# Patient Record
Sex: Male | Born: 1950 | Race: White | Hispanic: No | Smoking: Current every day smoker
Health system: Southern US, Community
[De-identification: ages and names within clinical notes are randomized; demographics above are authoritative.]

## PROBLEM LIST (undated history)

## (undated) DIAGNOSIS — I480 Paroxysmal atrial fibrillation: Secondary | ICD-10-CM

## (undated) DIAGNOSIS — I739 Peripheral vascular disease, unspecified: Secondary | ICD-10-CM

## (undated) DIAGNOSIS — Z930 Tracheostomy status: Secondary | ICD-10-CM

## (undated) DIAGNOSIS — N200 Calculus of kidney: Secondary | ICD-10-CM

## (undated) DIAGNOSIS — I4891 Unspecified atrial fibrillation: Secondary | ICD-10-CM

## (undated) DIAGNOSIS — I639 Cerebral infarction, unspecified: Secondary | ICD-10-CM

## (undated) DIAGNOSIS — G40909 Epilepsy, unspecified, not intractable, without status epilepticus: Secondary | ICD-10-CM

## (undated) DIAGNOSIS — J9621 Acute and chronic respiratory failure with hypoxia: Secondary | ICD-10-CM

## (undated) DIAGNOSIS — K219 Gastro-esophageal reflux disease without esophagitis: Secondary | ICD-10-CM

## (undated) DIAGNOSIS — I10 Essential (primary) hypertension: Secondary | ICD-10-CM

## (undated) DIAGNOSIS — J449 Chronic obstructive pulmonary disease, unspecified: Secondary | ICD-10-CM

## (undated) DIAGNOSIS — M549 Dorsalgia, unspecified: Secondary | ICD-10-CM

## (undated) DIAGNOSIS — G8929 Other chronic pain: Secondary | ICD-10-CM

## (undated) DIAGNOSIS — F419 Anxiety disorder, unspecified: Secondary | ICD-10-CM

## (undated) DIAGNOSIS — I499 Cardiac arrhythmia, unspecified: Secondary | ICD-10-CM

## (undated) DIAGNOSIS — Z87442 Personal history of urinary calculi: Secondary | ICD-10-CM

## (undated) DIAGNOSIS — M1990 Unspecified osteoarthritis, unspecified site: Secondary | ICD-10-CM

## (undated) DIAGNOSIS — Z931 Gastrostomy status: Secondary | ICD-10-CM

## (undated) HISTORY — PX: TRACHEOSTOMY TUBE PLACEMENT: SHX814

## (undated) HISTORY — DX: Cerebral infarction, unspecified: I63.9

## (undated) HISTORY — PX: AORTOGRAM: SHX6300

## (undated) HISTORY — DX: Unspecified atrial fibrillation: I48.91

## (undated) HISTORY — PX: KIDNEY STONE SURGERY: SHX686

## (undated) HISTORY — PX: CATARACT EXTRACTION, BILATERAL: SHX1313

## (undated) HISTORY — PX: CHOLECYSTECTOMY: SHX55

## (undated) HISTORY — PX: EYE SURGERY: SHX253

## (undated) HISTORY — PX: CATARACT EXTRACTION: SUR2

---

## 2000-01-04 ENCOUNTER — Emergency Department (HOSPITAL_COMMUNITY): Admission: EM | Admit: 2000-01-04 | Discharge: 2000-01-04 | Payer: Self-pay | Admitting: *Deleted

## 2006-04-28 ENCOUNTER — Ambulatory Visit: Payer: Self-pay | Admitting: Cardiology

## 2006-09-22 ENCOUNTER — Encounter: Admission: RE | Admit: 2006-09-22 | Discharge: 2006-10-22 | Payer: Self-pay | Admitting: Specialist

## 2008-12-02 ENCOUNTER — Ambulatory Visit: Payer: Self-pay | Admitting: Cardiology

## 2012-05-29 ENCOUNTER — Encounter (HOSPITAL_COMMUNITY): Payer: Self-pay | Admitting: *Deleted

## 2012-05-29 ENCOUNTER — Emergency Department (HOSPITAL_COMMUNITY)
Admission: EM | Admit: 2012-05-29 | Discharge: 2012-05-29 | Disposition: A | Discharge status: Home / Self Care | Payer: Medicare Other | Attending: Emergency Medicine | Admitting: Emergency Medicine

## 2012-05-29 DIAGNOSIS — G8929 Other chronic pain: Secondary | ICD-10-CM | POA: Insufficient documentation

## 2012-05-29 DIAGNOSIS — I10 Essential (primary) hypertension: Secondary | ICD-10-CM | POA: Insufficient documentation

## 2012-05-29 DIAGNOSIS — Z008 Encounter for other general examination: Secondary | ICD-10-CM | POA: Insufficient documentation

## 2012-05-29 DIAGNOSIS — F17200 Nicotine dependence, unspecified, uncomplicated: Secondary | ICD-10-CM | POA: Insufficient documentation

## 2012-05-29 DIAGNOSIS — R454 Irritability and anger: Secondary | ICD-10-CM

## 2012-05-29 HISTORY — DX: Other chronic pain: G89.29

## 2012-05-29 HISTORY — DX: Dorsalgia, unspecified: M54.9

## 2012-05-29 HISTORY — DX: Essential (primary) hypertension: I10

## 2012-05-29 LAB — COMPREHENSIVE METABOLIC PANEL
ALT: 9 U/L (ref 0–53)
AST: 13 U/L (ref 0–37)
Alkaline Phosphatase: 121 U/L — ABNORMAL HIGH (ref 39–117)
CO2: 31 mEq/L (ref 19–32)
Calcium: 9.8 mg/dL (ref 8.4–10.5)
GFR calc non Af Amer: 71 mL/min — ABNORMAL LOW (ref >90)
Potassium: 3.2 mEq/L — ABNORMAL LOW (ref 3.5–5.1)
Sodium: 141 mEq/L (ref 135–145)
Total Protein: 7.1 g/dL (ref 6.0–8.3)

## 2012-05-29 LAB — CBC
Hemoglobin: 16.1 g/dL (ref 13.0–17.0)
MCH: 31.6 pg (ref 26.0–34.0)
Platelets: 183 10*3/uL (ref 150–400)
RBC: 5.1 MIL/uL (ref 4.22–5.81)

## 2012-05-29 LAB — RAPID URINE DRUG SCREEN, HOSP PERFORMED
Barbiturates: NOT DETECTED
Benzodiazepines: NOT DETECTED
Cocaine: NOT DETECTED
Tetrahydrocannabinol: NOT DETECTED

## 2012-05-29 MED ORDER — POTASSIUM CHLORIDE CRYS ER 20 MEQ PO TBCR
40.0000 meq | EXTENDED_RELEASE_TABLET | Freq: Once | ORAL | Status: AC
  Administered 2012-05-29: 40 meq via ORAL
  Filled 2012-05-29: qty 2
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
## 2012-05-29 NOTE — ED Notes (Signed)
Patient stated that he had not taken his b/p medication today and upset over situation.
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
## 2013-03-04 ENCOUNTER — Other Ambulatory Visit: Payer: Self-pay | Admitting: *Deleted

## 2013-03-11 ENCOUNTER — Encounter: Payer: Self-pay | Admitting: Vascular Surgery

## 2013-03-12 ENCOUNTER — Encounter: Payer: Self-pay | Admitting: Vascular Surgery

## 2013-03-12 ENCOUNTER — Ambulatory Visit (INDEPENDENT_AMBULATORY_CARE_PROVIDER_SITE_OTHER): Payer: Medicare Other | Admitting: Vascular Surgery

## 2013-03-12 VITALS — BP 146/83 | HR 69 | Ht 60.0 in | Wt 135.0 lb

## 2013-03-12 DIAGNOSIS — I70219 Atherosclerosis of native arteries of extremities with intermittent claudication, unspecified extremity: Secondary | ICD-10-CM

## 2013-03-12 DIAGNOSIS — M545 Low back pain, unspecified: Secondary | ICD-10-CM

## 2013-03-12 DIAGNOSIS — I739 Peripheral vascular disease, unspecified: Secondary | ICD-10-CM

## 2013-03-12 NOTE — Progress Notes (Signed)
[facility] [place]  Referred by:  Caylen Daniel, MD [address] [place], [place] [address]  Reason for referral: right leg weakness  History of Present Illness  Adam Mcclain is a 62 y.o. (09/18/1950) male who presents with chief complaint: right leg weakness.  Onset of intermittent claudication at least 3 years ago.  Now patient have weakness in right leg with mowing lawn.  Pain is described as aching, crampy, severity 3-10/10, and associated with activity.  Patient has fallen severe times due to weakness.  Patient has attempted to treat this pain with rest and pain medication.  The patient has no rest pain symptoms also and no leg wounds/ulcers. The patient has chronic back pain managed with narcotics.  He also previously had a stroke without any report residual neurologic deficits.  Pt also notes some erectile dysfunction.  Atherosclerotic risk factors include: HTN, prior active smoking (recently quit).  Past Medical History  Diagnosis Date  . Hypertension   . Chronic back pain   . Stroke   . Atrial fibrillation     Past Surgical History  Procedure Laterality Date  . Cholecystectomy    . Eye surgery    . Kidney stone surgery    . Cataract extraction Bilateral     History   Social History  . Marital Status: Married    Spouse Name: N/A    Number of Children: N/A  . Years of Education: N/A   Occupational History  . Not on file.   Social History Main Topics  . Smoking status: Former Smoker    Types: Cigarettes    Quit date: 02/27/2013  . Smokeless tobacco: Never Used  . Alcohol Use: No  . Drug Use: No  . Sexually Active: Not on file   Other Topics Concern  . Not on file   Social History Narrative  . No narrative on file    Family History  Problem Relation Age of Onset  . Cancer Father   . Diabetes Father   . Hypertension Father     Current Outpatient Prescriptions on File Prior to Visit  Medication Sig Dispense Refill  . ALPRAZolam  (XANAX) 0.5 MG tablet Take 0.5 mg by mouth daily.      . lisinopril-hydrochlorothiazide (PRINZIDE,ZESTORETIC) 20-12.5 MG per tablet Take 1 tablet by mouth daily.      . buPROPion (WELLBUTRIN SR) 150 MG 12 hr tablet Take 150 mg by mouth 2 (two) times daily.       No current facility-administered medications on file prior to visit.   No Known Allergies  REVIEW OF SYSTEMS:  (Positives checked otherwise negative)  CARDIOVASCULAR:  [x] chest pain, [ ] chest pressure, [ ] palpitations, [ ] shortness of breath when laying flat, [x] shortness of breath with exertion,   [x] pain in feet when walking, [x] pain in feet when laying flat, [x] history of blood clot in veins (DVT), [ ] history of phlebitis, [x] swelling in legs, [x] varicose veins  PULMONARY:  [x] productive cough, [ ] asthma, [x] wheezing  NEUROLOGIC:  [x] weakness in arms or legs, [x] numbness in arms or legs, [x] difficulty speaking or slurred speech, [ ] temporary loss of vision in one eye, [x] dizziness  HEMATOLOGIC:  [ ] bleeding problems, [ ] problems with blood clotting too easily  MUSCULOSKEL:  [ ] joint pain, [ ] joint swelling  GASTROINTEST:  [ ]  Vomiting blood, [ ]  Blood in stool       GENITOURINARY:  [x]  Burning with urination, [ ]  Blood in urine  PSYCHIATRIC:  [x] history of major depression  INTEGUMENTARY:  [x] rashes, [x] ulcers  CONSTITUTIONAL:  [x] fever, [x] chills  For VQI Use Only  PRE-ADM LIVING: Home  AMB STATUS: Ambulatory  CAD Sx: None  PRIOR CHF: None  STRESS TEST: [x] No, [ ] Normal, [ ] + ischemia, [ ] + MI, [ ] Both  Physical Examination Filed Vitals:   03/12/13 1504  BP: 146/83  Pulse: 69  Height: 5' (1.524 m)  Weight: 135 lb (61.236 kg)  SpO2: 100%   Body mass index is 26.37 kg/(m^2).  General: A&O x 3, WDWN, somewhat unkempt, thick accent  Head: [place]/AT  Ear/Nose/Throat: Hearing grossly intact, nares w/o erythema or drainage, oropharynx w/o Erythema/Exudate  Eyes: PERRLA,  EOMI  Neck: Supple, no nuchal rigidity, no palpable LAD  Pulmonary: Sym exp, good air movt, CTAB, no rales & rhonchi, faint wheezing  Cardiac: RRR, Nl S1, S2, no Murmurs, rubs or gallops  Vascular: Vessel Right Left  Radial Palpable Palpable  Brachial Palpable Palpable  Carotid Palpable, without bruit Palpable, without bruit  Aorta Not palpable N/A  Femoral Not Palpable  Palpable  Popliteal  Not palpable  Not palpable  PT Not Palpable Not Palpable  DP Not Palpable Not Palpable   Gastrointestinal: soft, NTND, -G/R, - HSM, - masses, - CVAT B, no palpable midline pulsatile mass  Musculoskeletal: M/S 5/5 throughout , Extremities without ischemic changes , some hyperkyphosis   Neurologic: CN 2-12 intact , Pain and light touch intact in extremities , Motor exam as listed above  Psychiatric: Judgment intact, Mood & affect appropriatefor pt's clinical situation  Dermatologic: See M/S exam for extremity exam, no rashes otherwise noted  Lymph : No Cervical, Axillary, or Inguinal lymphadenopathy   Non-Invasive Vascular Imaging  ABI (Date: 03/12/2013)  R: 0.61  L: 0.56  Outside Studies/Documentation 10 pages of outside documents were reviewed including: outside ABI, outpatient clinic chart.  Medical Decision Making  Adam Mcclain is a 62 y.o. male who presents with: R>>L lifestyle limiting intermittent claudication, lower back pain with possible DDD   I have a suspicion that this patient's ABI are artificially elevated and he might in fact have critical limb ischemia.  The right .femoral pulse is difficulty to palpate, suggesting Right iliac occlusion.  I this patient, I think the potential benefit outweigh the risk, so I would proceed with an aortogram, bilateral leg runoff, and possible right intervention.  This is scheduled for 10 JUL 14  I discussed with the patient the natural history of intermittent claudication: 75% of patients have stable or improved symptoms in a year  an only 2% require amputation. Eventually 20% may require intervention in a year.  I discussed in depth with the patient the nature of atherosclerosis, and emphasized the importance of maximal medical management including strict control of blood pressure, blood glucose, and lipid levels, antiplatelet agent, obtaining regular exercise, and cessation of smoking.    The patient is aware that without maximal medical management the underlying atherosclerotic disease process will progress, limiting the benefit of any interventions.  I discussed in depth with the patient a walking plan and how to execute such. The patient is currently on a statin: Lovastatin.    The patient is currently on an anti-platelet: ASA.    Thank you for allowing us to participate in this patient's care.  Brian Chen, MD [facility] [place]   Office: [phone] Pager: [phone]  03/12/2013, 4:47 PM    

## 2013-03-18 ENCOUNTER — Other Ambulatory Visit: Payer: Self-pay

## 2013-03-22 ENCOUNTER — Encounter (HOSPITAL_COMMUNITY): Payer: Self-pay | Admitting: Pharmacy Technician

## 2013-03-25 ENCOUNTER — Encounter (HOSPITAL_COMMUNITY)
Admission: RE | Disposition: A | Discharge status: Home / Self Care | Payer: Self-pay | Source: Ambulatory Visit | Attending: Vascular Surgery

## 2013-03-25 ENCOUNTER — Ambulatory Visit (HOSPITAL_COMMUNITY)
Admission: RE | Admit: 2013-03-25 | Discharge: 2013-03-25 | Disposition: A | Discharge status: Home / Self Care | Payer: Medicare Other | Source: Ambulatory Visit | Attending: Vascular Surgery | Admitting: Vascular Surgery

## 2013-03-25 DIAGNOSIS — Z79899 Other long term (current) drug therapy: Secondary | ICD-10-CM | POA: Insufficient documentation

## 2013-03-25 DIAGNOSIS — M549 Dorsalgia, unspecified: Secondary | ICD-10-CM | POA: Insufficient documentation

## 2013-03-25 DIAGNOSIS — I70219 Atherosclerosis of native arteries of extremities with intermittent claudication, unspecified extremity: Secondary | ICD-10-CM

## 2013-03-25 DIAGNOSIS — I10 Essential (primary) hypertension: Secondary | ICD-10-CM | POA: Insufficient documentation

## 2013-03-25 DIAGNOSIS — Z8673 Personal history of transient ischemic attack (TIA), and cerebral infarction without residual deficits: Secondary | ICD-10-CM | POA: Insufficient documentation

## 2013-03-25 DIAGNOSIS — Z87891 Personal history of nicotine dependence: Secondary | ICD-10-CM | POA: Insufficient documentation

## 2013-03-25 DIAGNOSIS — Z9181 History of falling: Secondary | ICD-10-CM | POA: Insufficient documentation

## 2013-03-25 DIAGNOSIS — I708 Atherosclerosis of other arteries: Secondary | ICD-10-CM | POA: Insufficient documentation

## 2013-03-25 DIAGNOSIS — G8929 Other chronic pain: Secondary | ICD-10-CM | POA: Insufficient documentation

## 2013-03-25 DIAGNOSIS — I4891 Unspecified atrial fibrillation: Secondary | ICD-10-CM | POA: Insufficient documentation

## 2013-03-25 DIAGNOSIS — N529 Male erectile dysfunction, unspecified: Secondary | ICD-10-CM | POA: Insufficient documentation

## 2013-03-25 HISTORY — PX: ABDOMINAL AORTAGRAM: SHX5454

## 2013-03-25 HISTORY — PX: PERCUTANEOUS STENT INTERVENTION: SHX5500

## 2013-03-25 HISTORY — PX: ABDOMINAL AORTAGRAM: SHX5706

## 2013-03-25 HISTORY — PX: LOWER EXTREMITY ANGIOGRAM: SHX5508

## 2013-03-25 LAB — POCT I-STAT, CHEM 8
Creatinine, Ser: 1.4 mg/dL — ABNORMAL HIGH (ref 0.50–1.35)
Hemoglobin: 16.7 g/dL (ref 13.0–17.0)
Sodium: 140 mEq/L (ref 135–145)
TCO2: 28 mmol/L (ref 0–100)

## 2013-03-25 LAB — POCT ACTIVATED CLOTTING TIME: Activated Clotting Time: 196 seconds

## 2013-03-25 SURGERY — ANGIOGRAM, LOWER EXTREMITY
Anesthesia: LOCAL | Laterality: Right

## 2013-03-25 MED ORDER — HYDRALAZINE HCL 20 MG/ML IJ SOLN: 10.0000 mg | INTRAMUSCULAR | Status: DC | PRN

## 2013-03-25 MED ORDER — HYDRALAZINE HCL 20 MG/ML IJ SOLN
INTRAMUSCULAR | Status: AC
  Filled 2013-03-25: qty 1

## 2013-03-25 MED ORDER — MORPHINE SULFATE 2 MG/ML IJ SOLN: 2.0000 mg | INTRAMUSCULAR | Status: DC | PRN

## 2013-03-25 MED ORDER — SODIUM CHLORIDE 0.9 % IV SOLN
INTRAVENOUS | Status: DC
  Administered 2013-03-25: 08:00:00 via INTRAVENOUS

## 2013-03-25 MED ORDER — OXYCODONE-ACETAMINOPHEN 5-325 MG PO TABS: 1.0000 | ORAL_TABLET | ORAL | Status: DC | PRN

## 2013-03-25 MED ORDER — CLOPIDOGREL BISULFATE 300 MG PO TABS
ORAL_TABLET | ORAL | Status: AC
  Filled 2013-03-25: qty 1

## 2013-03-25 MED ORDER — HEPARIN SODIUM (PORCINE) 1000 UNIT/ML IJ SOLN
INTRAMUSCULAR | Status: AC
  Filled 2013-03-25: qty 1

## 2013-03-25 MED ORDER — HYDRALAZINE HCL 20 MG/ML IJ SOLN
10.0000 mg | Freq: Once | INTRAMUSCULAR | Status: AC
  Administered 2013-03-25: 10 mg via INTRAVENOUS
  Filled 2013-03-25: qty 1

## 2013-03-25 MED ORDER — SODIUM CHLORIDE 0.9 % IV SOLN: INTRAVENOUS | Status: DC

## 2013-03-25 MED ORDER — MIDAZOLAM HCL 2 MG/2ML IJ SOLN
INTRAMUSCULAR | Status: AC
  Filled 2013-03-25: qty 2

## 2013-03-25 MED ORDER — FENTANYL CITRATE 0.05 MG/ML IJ SOLN
INTRAMUSCULAR | Status: AC
  Filled 2013-03-25: qty 2

## 2013-03-25 MED ORDER — ATROPINE SULFATE 0.1 MG/ML IJ SOLN
1.0000 mg | Freq: Once | INTRAMUSCULAR | Status: AC
  Administered 2013-03-25: 1 mg via INTRAVENOUS

## 2013-03-25 MED ORDER — HYDRALAZINE HCL 20 MG/ML IJ SOLN
INTRAMUSCULAR | Status: AC
  Administered 2013-03-25: 10 mg via INTRAVENOUS
  Filled 2013-03-25: qty 1

## 2013-03-25 MED ORDER — LIDOCAINE HCL (PF) 1 % IJ SOLN
INTRAMUSCULAR | Status: AC
  Filled 2013-03-25: qty 30

## 2013-03-25 MED ORDER — ACETAMINOPHEN 325 MG PO TABS: 650.0000 mg | ORAL_TABLET | ORAL | Status: DC | PRN

## 2013-03-25 MED ORDER — HEPARIN (PORCINE) IN NACL 2-0.9 UNIT/ML-% IJ SOLN
INTRAMUSCULAR | Status: AC
  Filled 2013-03-25: qty 1000

## 2013-03-25 MED ORDER — CLOPIDOGREL BISULFATE 75 MG PO TABS: 75.0000 mg | ORAL_TABLET | Freq: Every day | ORAL | Status: DC

## 2013-03-25 MED ORDER — ONDANSETRON HCL 4 MG/2ML IJ SOLN: 4.0000 mg | Freq: Four times a day (QID) | INTRAMUSCULAR | Status: DC | PRN

## 2013-03-25 MED ORDER — ATROPINE SULFATE 1 MG/ML IJ SOLN
INTRAMUSCULAR | Status: AC
  Filled 2013-03-25: qty 1

## 2013-03-25 NOTE — Op Note (Signed)
OPERATIVE NOTE   PROCEDURE: 1.  Left common femoral artery cannulation under ultrasound guidance 2.  Aortogram 3.  Third order arterial selection 4.  Right common iliac artery stenting and angioplasty (7 mm x 22 mm iCAST) 5.  Bilateral leg runoff  PRE-OPERATIVE DIAGNOSIS: Right > left leg claudication  POST-OPERATIVE DIAGNOSIS: same as above   SURGEON: Leonides Sake, MD  ANESTHESIA: conscious sedation  ESTIMATED BLOOD LOSS: 50 cc  CONTRAST: 150 cc  FINDING(S):  Aorta: patent but diffusely diseased  Superior mesenteric artery: not well imaged Celiac artery: not imaged   Right Left  RA Patent 2 polar arteries with diseased inferior artery Patent 2 polar arteries with >90% stenosis in inferior artery  CIA >90% stenosis in proximal CIA: resolved with stenting and angioplasty Patent but diseased  EIA Patent but diseased Patent but diseased  IIA Patent >90% occlusion at orifice  CFA Patent Patent  SFA Patent but diseased Occluded  PFA Patent Patent with extensive hypertrophy and collaterals  Pop Patent but diseased Patent but diseased  Trif Patent Patent  AT Patent proximally, occludes shortly after take off Patent proximally, occludes shortly after take off  Pero Patent, non-dominant runoff to foot Patent proximally, diminishes distally  PT Patent, dominant runoff to foot Patent, dominant runoff to foot   SPECIMEN(S):  none  INDICATIONS:   Adam Mcclain is a 62 y.o. male who presents with right >> left intermittent claudication.  The patient presents for: aortogram, bilateral leg runoff, and possible intervention.  I discussed with the patient the nature of angiographic procedures, especially the limited patencies of any endovascular intervention.  The patient is aware of that the risks of an angiographic procedure include but are not limited to: bleeding, infection, access site complications, renal failure, embolization, rupture of vessel, dissection, possible need for emergent  surgical intervention, possible need for surgical procedures to treat the patient's pathology, and stroke and death.  The patient is aware of the risks and agrees to proceed.  DESCRIPTION: After full informed consent was obtained from the patient, the patient was brought back to the angiography suite.  The patient was placed supine upon the angiography table and connected to monitoring equipment.  The patient was then given conscious sedation, the amounts of which are documented in the patient's chart.  The patient was prepped and drape in the standard fashion for an angiographic procedure.  At this point, attention was turned to the left groin.  Under ultrasound guidance, the left common femoral artery will be cannulated with a 18 gauge needle.  The [facility] wire was passed up into the aorta.  The needle was exchanged for a 5-Fr sheath, which was advanced over the wire into the common femoral artery.  The dilator was then removed.The Omniflush catheter was then loaded over the wire up to the level of L1.  The catheter was connected to the power injector circuit.  After de-airring and de-clotting the circuit, a power injector aortogram was completed.  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  The [facility] wire was replaced in the catheter, and using the [place] and Omniflush catheter, the right common iliac artery was selected.  The wire was advanced into the common femoral artery.  The catheter was exchanged for an end-hole catheter and advanced over the wire into the right common femoral artery.  The wire was exchanged for a Rosen wire.  The patient's left femoral sheath was exchanged for a  7-Fr destination sheath, which was lodged in the right external iliac artery.  The dilator was removed.  The patient was given 5000 units of Heparin intravenously, which was a therapeutic bolus.  Based on vertebral landmarks, the lesion was at ~L4 vertebral body.  A 7  mm x 22 mm iCAST stent was selected and advanced through the stent to where the lesion was suspected.  I pulled back the sheath to expose the stent.  I did hand injections to identify the location of the stenosis.  I pulled back the sheath and stent to center the stent on the stenosis.  The stent was deployed at 10 atm of inflation.  I removed the stent delivery device and did a completion injection.  Complete resolution of the stenosis had occurred with exact placement of the stent.  At this point, I placed the endhole catheter over the wire to recapture the tip of the Rosen wire.  The wire was readvanced into the distal aorta.  The Omniflush was replaced over the wire into the distal aorta.  An automated bilateral leg runoff was completed.  The findings are listed above.  Due to patient movement repeat stationary image of the tibial arteries was completed.  I pulled the sheath back into left external iliac artery.  The sheath was aspirated.  No clots were present and the sheath was reloaded with heparinized saline.    COMPLICATIONS: none  CONDITION: stable  Leonides Sake, MD [facility] [place] Office: [phone] Pager: [phone]  03/25/2013, 11:36 AM

## 2013-03-25 NOTE — Interval H&P Note (Signed)
[facility]   History and Physical Update  The patient was interviewed and re-examined.  The patient's previous History and Physical has been reviewed and is unchanged.  There is no change in the plan of care: Ao, BRo, possible R leg runoff.  Leonides Sake, MD [facility] [place] Office: [phone] Pager: [phone]  03/25/2013, 9:24 AM

## 2013-03-25 NOTE — H&P (View-Only) (Signed)
[facility] [place]  Referred by:  Donzetta Sprung, MD [address] [place]. [place], [place] [address]  Reason for referral: right leg weakness  History of Present Illness  Adam Mcclain is a 62 y.o. ([birth year]) male who presents with chief complaint: right leg weakness.  Onset of intermittent claudication at least 3 years ago.  Now patient have weakness in right leg with mowing lawn.  Pain is described as aching, crampy, severity 3-10/10, and associated with activity.  Patient has fallen severe times due to weakness.  Patient has attempted to treat this pain with rest and pain medication.  The patient has no rest pain symptoms also and no leg wounds/ulcers. The patient has chronic back pain managed with narcotics.  He also previously had a stroke without any report residual neurologic deficits.  Pt also notes some erectile dysfunction.  Atherosclerotic risk factors include: HTN, prior active smoking (recently quit).  Past Medical History  Diagnosis Date  . Hypertension   . Chronic back pain   . Stroke   . Atrial fibrillation     Past Surgical History  Procedure Laterality Date  . Cholecystectomy    . Eye surgery    . Kidney stone surgery    . Cataract extraction Bilateral     History   Social History  . Marital Status: Married    Spouse Name: N/A    Number of Children: N/A  . Years of Education: N/A   Occupational History  . Not on file.   Social History Main Topics  . Smoking status: Former Smoker    Types: Cigarettes    Quit date: 02/27/2013  . Smokeless tobacco: Never Used  . Alcohol Use: No  . Drug Use: No  . Sexually Active: Not on file   Other Topics Concern  . Not on file   Social History Narrative  . No narrative on file    Family History  Problem Relation Age of Onset  . Cancer Father   . Diabetes Father   . Hypertension Father     Current Outpatient Prescriptions on File Prior to Visit  Medication Sig Dispense Refill  . ALPRAZolam  (XANAX) 0.5 MG tablet Take 0.5 mg by mouth daily.      Marland Kitchen lisinopril-hydrochlorothiazide (PRINZIDE,ZESTORETIC) 20-12.5 MG per tablet Take 1 tablet by mouth daily.      Marland Kitchen buPROPion (WELLBUTRIN SR) 150 MG 12 hr tablet Take 150 mg by mouth 2 (two) times daily.       No current facility-administered medications on file prior to visit.   No Known Allergies  REVIEW OF SYSTEMS:  (Positives checked otherwise negative)  CARDIOVASCULAR:  [x]  chest pain, [ ]  chest pressure, [ ]  palpitations, [ ]  shortness of breath when laying flat, [x]  shortness of breath with exertion,   [x]  pain in feet when walking, [x]  pain in feet when laying flat, [x]  history of blood clot in veins (DVT), [ ]  history of phlebitis, [x]  swelling in legs, [x]  varicose veins  PULMONARY:  [x]  productive cough, [ ]  asthma, [x]  wheezing  NEUROLOGIC:  [x]  weakness in arms or legs, [x]  numbness in arms or legs, [x]  difficulty speaking or slurred speech, [ ]  temporary loss of vision in one eye, [x]  dizziness  HEMATOLOGIC:  [ ]  bleeding problems, [ ]  problems with blood clotting too easily  MUSCULOSKEL:  [ ]  joint pain, [ ]  joint swelling  GASTROINTEST:  [ ]   Vomiting blood, [ ]   Blood in stool  GENITOURINARY:  [x]   Burning with urination, [ ]   Blood in urine  PSYCHIATRIC:  [x]  history of major depression  INTEGUMENTARY:  [x]  rashes, [x]  ulcers  CONSTITUTIONAL:  [x]  fever, [x]  chills  For VQI Use Only  PRE-ADM LIVING: Home  AMB STATUS: Ambulatory  CAD Sx: None  PRIOR CHF: None  STRESS TEST: [x]  No, [ ]  Normal, [ ]  + ischemia, [ ]  + MI, [ ]  Both  Physical Examination Filed Vitals:   03/12/13 1504  BP: 146/83  Pulse: 69  Height: 5' (1.524 m)  Weight: 135 lb (61.236 kg)  SpO2: 100%   Body mass index is 26.37 kg/(m^2).  General: A&O x 3, WDWN, somewhat unkempt, thick accent  Head: [place]/AT  Ear/Nose/Throat: Hearing grossly intact, nares w/o erythema or drainage, oropharynx w/o Erythema/Exudate  Eyes: PERRLA,  EOMI  Neck: Supple, no nuchal rigidity, no palpable LAD  Pulmonary: Sym exp, good air movt, CTAB, no rales & rhonchi, faint wheezing  Cardiac: RRR, Nl S1, S2, no Murmurs, rubs or gallops  Vascular: Vessel Right Left  Radial Palpable Palpable  Brachial Palpable Palpable  Carotid Palpable, without bruit Palpable, without bruit  Aorta Not palpable N/A  Femoral Not Palpable  Palpable  Popliteal  Not palpable  Not palpable  PT Not Palpable Not Palpable  DP Not Palpable Not Palpable   Gastrointestinal: soft, NTND, -G/R, - HSM, - masses, - CVAT B, no palpable midline pulsatile mass  Musculoskeletal: M/S 5/5 throughout , Extremities without ischemic changes , some hyperkyphosis   Neurologic: CN 2-12 intact , Pain and light touch intact in extremities , Motor exam as listed above  Psychiatric: Judgment intact, Mood & affect appropriatefor pt's clinical situation  Dermatologic: See M/S exam for extremity exam, no rashes otherwise noted  Lymph : No Cervical, Axillary, or Inguinal lymphadenopathy   Non-Invasive Vascular Imaging  ABI (Date: 03/12/2013)  R: 0.61  L: 0.56  Outside Studies/Documentation 10 pages of outside documents were reviewed including: outside ABI, outpatient clinic chart.  Medical Decision Making  Adam Mcclain is a 62 y.o. male who presents with: R>>L lifestyle limiting intermittent claudication, lower back pain with possible DDD   I have a suspicion that this patient's ABI are artificially elevated and he might in fact have critical limb ischemia.  The right .femoral pulse is difficulty to palpate, suggesting Right iliac occlusion.  I this patient, I think the potential benefit outweigh the risk, so I would proceed with an aortogram, bilateral leg runoff, and possible right intervention.  This is scheduled for 10 JUL 14  I discussed with the patient the natural history of intermittent claudication: 75% of patients have stable or improved symptoms in a year  an only 2% require amputation. Eventually 20% may require intervention in a year.  I discussed in depth with the patient the nature of atherosclerosis, and emphasized the importance of maximal medical management including strict control of blood pressure, blood glucose, and lipid levels, antiplatelet agent, obtaining regular exercise, and cessation of smoking.    The patient is aware that without maximal medical management the underlying atherosclerotic disease process will progress, limiting the benefit of any interventions.  I discussed in depth with the patient a walking plan and how to execute such. The patient is currently on a statin: Lovastatin.    The patient is currently on an anti-platelet: ASA.    Thank you for allowing [place] to participate in this patient's care.  Leonides Sake, MD [facility]  Office: [phone] Pager: [phone]  03/12/2013, 4:47 PM

## 2013-03-25 NOTE — Progress Notes (Signed)
[facility] [place]  Daily Progress Note  Assessment/Planning: POD #0 s/p R CIA PTA+S   Likely vasovagal episode without any further sx or bradycardia  Ok to D/C  Subjective  - Day of Surgery  By report, bradycardiac down to 40s 15 minutes after sheath pull.  Pt responded to atropine.  Objective Filed Vitals:   03/25/13 0748 03/25/13 0855 03/25/13 0957 03/25/13 1456  BP: 214/102 188/79  116/72  Pulse:   67 74  Temp:      TempSrc:      Resp:      Height:      Weight:      SpO2:       No intake or output data in the 24 hours ending 03/25/13 1503  PULM  CTAB CV  RRR GI  soft, NTND VASC  L groin: soft, no hematoma, no LLQ TTP  Laboratory CBC    Component Value Date/Time   WBC 7.7 05/29/2012 1610   HGB 16.7 03/25/2013 0732   HCT 49.0 03/25/2013 0732   PLT 183 05/29/2012 1610    BMET    Component Value Date/Time   NA 140 03/25/2013 0732   K 3.9 03/25/2013 0732   CL 101 03/25/2013 0732   CO2 31 05/29/2012 1610   GLUCOSE 95 03/25/2013 0732   BUN 14 03/25/2013 0732   CREATININE 1.40* 03/25/2013 0732   CALCIUM 9.8 05/29/2012 1610   GFRNONAA 71* 05/29/2012 1610   GFRAA 82* 05/29/2012 1610    Leonides Sake, MD [facility] [place] Office: [phone] Pager: [phone]  03/25/2013, 3:03 PM

## 2013-03-26 ENCOUNTER — Telehealth: Payer: Self-pay | Admitting: Vascular Surgery

## 2013-03-26 NOTE — Telephone Encounter (Signed)
Gave patient follow up appointment information - kf °

## 2013-03-26 NOTE — Telephone Encounter (Signed)
Message copied by Margaretmary Eddy on Fri Mar 26, 2013  9:17 AM ------      Message from: Phillips Odor      Created: Thu Mar 25, 2013  2:38 PM                   ----- Message -----         From: Fransisco Hertz, MD         Sent: 03/25/2013  11:50 AM           To: Reuel Derby, Melene Plan, RN            Adam Mcclain      [id number]      1950/10/05            PROCEDURE:      1.  Left common femoral artery cannulation under ultrasound guidance      2.  Aortogram      3.  Third order arterial selection      4.  Right common iliac artery stenting and angioplasty (7 mm x 22 mm iCAST)      5.  Bilateral leg runoff            Follow-up: 4 weeks ------

## 2013-04-22 ENCOUNTER — Encounter: Payer: Self-pay | Admitting: Vascular Surgery

## 2013-04-23 ENCOUNTER — Encounter: Payer: Self-pay | Admitting: Vascular Surgery

## 2013-04-23 ENCOUNTER — Ambulatory Visit (INDEPENDENT_AMBULATORY_CARE_PROVIDER_SITE_OTHER): Payer: Medicare Other | Admitting: Vascular Surgery

## 2013-04-23 VITALS — BP 150/81 | HR 64 | Resp 16 | Ht 61.0 in | Wt 135.0 lb

## 2013-04-23 DIAGNOSIS — I739 Peripheral vascular disease, unspecified: Secondary | ICD-10-CM

## 2013-04-23 DIAGNOSIS — I70219 Atherosclerosis of native arteries of extremities with intermittent claudication, unspecified extremity: Secondary | ICD-10-CM

## 2013-04-23 DIAGNOSIS — R209 Unspecified disturbances of skin sensation: Secondary | ICD-10-CM | POA: Insufficient documentation

## 2013-04-23 DIAGNOSIS — Z48812 Encounter for surgical aftercare following surgery on the circulatory system: Secondary | ICD-10-CM

## 2013-04-23 MED ORDER — ZOLPIDEM TARTRATE 10 MG PO TABS: 10.0000 mg | ORAL_TABLET | Freq: Every evening | ORAL | Status: DC | PRN

## 2013-04-23 NOTE — Progress Notes (Signed)
[facility] [place]  Postoperative Visit  History of Present Illness  Adam Mcclain is a 62 y.o. male who presents for postoperative follow-up from procedure on Date: 03/25/13: R CIA PTA+S .  The patient notes great improvement in R leg claudication.  The patient is  able to complete their activities of daily living.  The patient's current symptoms are: mild L leg claudication.  Past Medical History, Past Surgical History, Social History, Family History, Medications, Allergies, and Review of Systems are unchanged from previous evaluation on 03/25/13.  On ROS: pt continues to have some erectile dysfunction, no rest pain  For VQI Use Only  PRE-ADM LIVING: Home  AMB STATUS: Ambulatory  Physical Examination  Filed Vitals:   04/23/13 1540  BP: 150/81  Pulse: 64  Resp: 16  Height: 5\' 1"  (1.549 m)  Weight: 135 lb (61.236 kg)  SpO2: 100%   Body mass index is 25.52 kg/(m^2).  General: A&O x 3, WDWN  Pulmonary: Sym exp, good air movt, CTAB, no rales, rhonchi, & wheezing  Cardiac: RRR, Nl S1, S2, no Murmurs, rubs or gallops  Vascular: Vessel Right Left  Radial Palpable Palpable  Ulnar Palpable Palpable  Brachial Palpable Palpable  Carotid Palpable, without bruit Palpable, without bruit  Aorta Not palpable N/A  Femoral Palpable Palpable  Popliteal Not palpable Not palpable  PT Faintly Palpable Not Palpable  DP Not Palpable Not Palpable    Gastrointestinal: soft, NTND, -G/R, - HSM, - masses, - CVAT B  Musculoskeletal: M/S 5/5 throughout , Extremities without ischemic changes , L groin without hematoma, no echymosis present at cannulation site  Neurologic:  Pain and light touch intact in extremities , Motor exam as listed above  Medical Decision Making  Adam Mcclain is a 62 y.o. male who presents s/p R CIA PTA+S for high grade stenosis, B IC with improved R sx Based on his angiographic findings, this patient needs: q3 mon ABI and R Aortoiliac  duplex. The patient's hypertrophied L PFA collaterals essential act as an internal bypass so no further intervention needed at this point.   I discussed in depth with the patient the nature of atherosclerosis, and emphasized the importance of maximal medical management including strict control of blood pressure, blood glucose, and lipid levels, obtaining regular exercise, and cessation of smoking.  The patient is aware that without maximal medical management the underlying atherosclerotic disease process will progress, limiting the benefit of any interventions. The patient is currently on a statin: Mevacor.   The patient is currently on an anti-platelet: Plavix.  Thank you for allowing [place] to participate in this patient's care.  Leonides Sake, MD [facility] [place] Office: [phone] Pager: [phone]

## 2013-04-26 NOTE — Addendum Note (Signed)
Addended by: Sharee Pimple on: 04/26/2013 08:09 AM   Modules accepted: Orders

## 2013-07-30 ENCOUNTER — Ambulatory Visit: Payer: Medicare Other | Admitting: Vascular Surgery

## 2013-07-30 ENCOUNTER — Encounter (HOSPITAL_COMMUNITY): Payer: Medicare Other

## 2013-07-30 ENCOUNTER — Other Ambulatory Visit (HOSPITAL_COMMUNITY): Payer: Medicare Other

## 2013-08-13 ENCOUNTER — Ambulatory Visit: Payer: Medicare Other | Admitting: Vascular Surgery

## 2013-08-13 ENCOUNTER — Other Ambulatory Visit: Payer: Medicare Other

## 2014-08-25 ENCOUNTER — Encounter (HOSPITAL_COMMUNITY): Payer: Self-pay | Admitting: Vascular Surgery

## 2014-10-18 DIAGNOSIS — R05 Cough: Secondary | ICD-10-CM | POA: Diagnosis not present

## 2014-10-18 DIAGNOSIS — R509 Fever, unspecified: Secondary | ICD-10-CM | POA: Diagnosis not present

## 2014-10-21 DIAGNOSIS — S30861A Insect bite (nonvenomous) of abdominal wall, initial encounter: Secondary | ICD-10-CM | POA: Diagnosis not present

## 2015-01-13 DIAGNOSIS — J209 Acute bronchitis, unspecified: Secondary | ICD-10-CM | POA: Diagnosis not present

## 2015-02-06 DIAGNOSIS — H40033 Anatomical narrow angle, bilateral: Secondary | ICD-10-CM | POA: Diagnosis not present

## 2015-02-06 DIAGNOSIS — H2513 Age-related nuclear cataract, bilateral: Secondary | ICD-10-CM | POA: Diagnosis not present

## 2015-02-08 DIAGNOSIS — H26493 Other secondary cataract, bilateral: Secondary | ICD-10-CM | POA: Diagnosis not present

## 2015-02-08 DIAGNOSIS — H538 Other visual disturbances: Secondary | ICD-10-CM | POA: Diagnosis not present

## 2015-02-20 ENCOUNTER — Ambulatory Visit (HOSPITAL_COMMUNITY)
Admission: RE | Admit: 2015-02-20 | Discharge: 2015-02-20 | Disposition: A | Discharge status: Home / Self Care | Payer: Medicare Other | Source: Ambulatory Visit | Attending: Ophthalmology | Admitting: Ophthalmology

## 2015-02-20 ENCOUNTER — Encounter (HOSPITAL_COMMUNITY)
Admission: RE | Disposition: A | Discharge status: Home / Self Care | Payer: Self-pay | Source: Ambulatory Visit | Attending: Ophthalmology

## 2015-02-20 DIAGNOSIS — H2640 Unspecified secondary cataract: Secondary | ICD-10-CM | POA: Diagnosis not present

## 2015-02-20 DIAGNOSIS — H538 Other visual disturbances: Secondary | ICD-10-CM | POA: Diagnosis not present

## 2015-02-20 DIAGNOSIS — H26492 Other secondary cataract, left eye: Secondary | ICD-10-CM | POA: Diagnosis not present

## 2015-02-20 HISTORY — PX: YAG LASER APPLICATION: SHX6189

## 2015-02-20 SURGERY — TREATMENT, USING YAG LASER
Anesthesia: LOCAL | Laterality: Left

## 2015-02-20 MED ORDER — TETRACAINE HCL 0.5 % OP SOLN
OPHTHALMIC | Status: AC
  Filled 2015-02-20: qty 2

## 2015-02-20 MED ORDER — TETRACAINE HCL 0.5 % OP SOLN
1.0000 [drp] | Freq: Once | OPHTHALMIC | Status: AC
  Administered 2015-02-20: 1 [drp] via OPHTHALMIC

## 2015-02-20 MED ORDER — APRACLONIDINE HCL 1 % OP SOLN
OPHTHALMIC | Status: AC
  Filled 2015-02-20: qty 0.1

## 2015-02-20 MED ORDER — APRACLONIDINE HCL 1 % OP SOLN
1.0000 [drp] | OPHTHALMIC | Status: DC
  Administered 2015-02-20 (×2): 1 [drp] via OPHTHALMIC

## 2015-02-20 MED ORDER — TROPICAMIDE 1 % OP SOLN
OPHTHALMIC | Status: AC
  Filled 2015-02-20: qty 3

## 2015-02-20 MED ORDER — TROPICAMIDE 1 % OP SOLN
1.0000 [drp] | OPHTHALMIC | Status: AC
  Administered 2015-02-20 (×3): 1 [drp] via OPHTHALMIC

## 2015-02-20 NOTE — Brief Op Note (Signed)
Adam Mcclain 02/20/2015  Adam Simmondsarroll [name], MD  Pre-op Diagnosis:  secondary cataract left eye  Post-op Diagnosis: same  Yag laser self-test completed: Yes.    Indications:  See office H&P for indications  Procedure: YAG posterior capsulotoomy OS  Eye protection worn by staff:  Yes.   Laser In Use sign on door:  Yes.    Laser:  {LUMENIS YAG/SLT LASER  Power Setting:  1.7 mJ/burst Anatomical site treated:  oposterior capsule OS Number of applications:  77 Total energy delivered: 83.01 mJ Results:  Open capsule with clear visual axis  Patient was instructed to go to the office, as previously scheduled, for intraocular pressure:  Yes.    Patient verbalizes understanding of discharge instructions:  Yes.    Notes:  Pt tolerated procedure well without complication

## 2015-02-20 NOTE — Discharge Instructions (Signed)
Adam Mcclain  02/20/2015     Instructions    Activity: No Restrictions.   Diet: Resume Diet you were on at home.   Pain Medication: Tylenol if Needed.   CONTACT YOUR DOCTOR IF YOU HAVE PAIN, REDNESS IN YOUR EYE, OR DECREASED VISION.   Follow-up 03/14/2015 at 1:45 with Susa Simmondsarroll F Haines, MD.     Dr. Lita MainsHaines: [phone]     If you find that you cannot contact your physician, but feel that your signs and   Symptoms warrant a physician's attention, call the Emergency Room at   [phone] [phone].

## 2015-02-21 ENCOUNTER — Encounter (HOSPITAL_COMMUNITY): Payer: Self-pay | Admitting: Ophthalmology

## 2015-03-15 DIAGNOSIS — I10 Essential (primary) hypertension: Secondary | ICD-10-CM | POA: Diagnosis not present

## 2015-03-15 DIAGNOSIS — F17210 Nicotine dependence, cigarettes, uncomplicated: Secondary | ICD-10-CM | POA: Diagnosis not present

## 2015-03-15 DIAGNOSIS — F325 Major depressive disorder, single episode, in full remission: Secondary | ICD-10-CM | POA: Diagnosis not present

## 2015-03-15 DIAGNOSIS — E782 Mixed hyperlipidemia: Secondary | ICD-10-CM | POA: Diagnosis not present

## 2015-03-15 DIAGNOSIS — J449 Chronic obstructive pulmonary disease, unspecified: Secondary | ICD-10-CM | POA: Diagnosis not present

## 2015-03-22 DIAGNOSIS — J449 Chronic obstructive pulmonary disease, unspecified: Secondary | ICD-10-CM | POA: Diagnosis not present

## 2015-03-22 DIAGNOSIS — Z1389 Encounter for screening for other disorder: Secondary | ICD-10-CM | POA: Diagnosis not present

## 2015-03-22 DIAGNOSIS — Z23 Encounter for immunization: Secondary | ICD-10-CM | POA: Diagnosis not present

## 2015-03-22 DIAGNOSIS — Z0001 Encounter for general adult medical examination with abnormal findings: Secondary | ICD-10-CM | POA: Diagnosis not present

## 2015-03-24 DIAGNOSIS — R9431 Abnormal electrocardiogram [ECG] [EKG]: Secondary | ICD-10-CM | POA: Diagnosis not present

## 2015-03-24 DIAGNOSIS — R079 Chest pain, unspecified: Secondary | ICD-10-CM | POA: Diagnosis not present

## 2015-03-24 DIAGNOSIS — R0989 Other specified symptoms and signs involving the circulatory and respiratory systems: Secondary | ICD-10-CM | POA: Diagnosis not present

## 2015-03-28 DIAGNOSIS — I6523 Occlusion and stenosis of bilateral carotid arteries: Secondary | ICD-10-CM | POA: Diagnosis not present

## 2015-03-28 DIAGNOSIS — R9431 Abnormal electrocardiogram [ECG] [EKG]: Secondary | ICD-10-CM | POA: Diagnosis not present

## 2015-03-28 DIAGNOSIS — R079 Chest pain, unspecified: Secondary | ICD-10-CM | POA: Diagnosis not present

## 2015-03-30 ENCOUNTER — Other Ambulatory Visit: Payer: Self-pay | Admitting: *Deleted

## 2015-03-30 DIAGNOSIS — I6522 Occlusion and stenosis of left carotid artery: Secondary | ICD-10-CM

## 2015-04-06 DIAGNOSIS — I714 Abdominal aortic aneurysm, without rupture: Secondary | ICD-10-CM | POA: Diagnosis not present

## 2015-04-06 DIAGNOSIS — Z9049 Acquired absence of other specified parts of digestive tract: Secondary | ICD-10-CM | POA: Diagnosis not present

## 2015-04-06 DIAGNOSIS — K760 Fatty (change of) liver, not elsewhere classified: Secondary | ICD-10-CM | POA: Diagnosis not present

## 2015-04-06 DIAGNOSIS — N281 Cyst of kidney, acquired: Secondary | ICD-10-CM | POA: Diagnosis not present

## 2015-04-06 DIAGNOSIS — K7689 Other specified diseases of liver: Secondary | ICD-10-CM | POA: Diagnosis not present

## 2015-04-06 DIAGNOSIS — N261 Atrophy of kidney (terminal): Secondary | ICD-10-CM | POA: Diagnosis not present

## 2015-04-27 ENCOUNTER — Encounter: Payer: Self-pay | Admitting: Vascular Surgery

## 2015-04-28 ENCOUNTER — Encounter: Payer: Self-pay | Admitting: Vascular Surgery

## 2015-04-28 ENCOUNTER — Ambulatory Visit (INDEPENDENT_AMBULATORY_CARE_PROVIDER_SITE_OTHER): Payer: Medicare Other | Admitting: Vascular Surgery

## 2015-04-28 ENCOUNTER — Ambulatory Visit (HOSPITAL_COMMUNITY)
Admission: RE | Admit: 2015-04-28 | Discharge: 2015-04-28 | Disposition: A | Discharge status: Home / Self Care | Payer: Medicare Other | Source: Ambulatory Visit | Attending: Vascular Surgery | Admitting: Vascular Surgery

## 2015-04-28 VITALS — BP 174/99 | HR 52 | Temp 97.7°F | Ht 61.0 in | Wt 132.3 lb

## 2015-04-28 DIAGNOSIS — I70219 Atherosclerosis of native arteries of extremities with intermittent claudication, unspecified extremity: Secondary | ICD-10-CM

## 2015-04-28 DIAGNOSIS — I6522 Occlusion and stenosis of left carotid artery: Secondary | ICD-10-CM | POA: Diagnosis not present

## 2015-04-28 NOTE — Progress Notes (Signed)
New Carotid Patient  Referred by:  Richardean Chimera, MD [address] [place], [place] [address]  Reason for referral: left carotid stenosis  History of Present Illness  Adam Mcclain is a 64 y.o. (08-09-1951) male who presents with chief complaint: "abnormal test".  Previous carotid studies demonstrated: LICA >70% stenosis.  Patient has no history of TIA or stroke symptom.  The patient has never had amaurosis fugax or monocular blindness.  The patient has never had facial drooping or hemiplegia.  The patient has never had receptive or expressive aphasia.   The patient's risks factors for carotid disease include: HTN, afib, intermittent smoking.  This patient is also s/p R CIA PTA+S (03/25/13).  Pt also had L IIA stenosis >90% and L inferior pole artery stenosis >90%.  Pt currently denies any intermittent claudication or difficulties with urinating.  He continues to have some ED. Pt continues to intermittently smoke.  Past Medical History  Diagnosis Date  . Hypertension   . Chronic back pain   . Stroke   . Atrial fibrillation     Past Surgical History  Procedure Laterality Date  . Cholecystectomy    . Eye surgery    . Kidney stone surgery    . Cataract extraction Bilateral   . Abdominal aortagram  03-25-13    with Stent  . Lower extremity angiogram N/A 03/25/2013    Procedure: LOWER EXTREMITY ANGIOGRAM;  Surgeon: Fransisco Hertz, MD;  Location: [facility] CATH LAB;  Service: Cardiovascular;  Laterality: N/A;  . Abdominal aortagram N/A 03/25/2013    Procedure: ABDOMINAL AORTAGRAM;  Surgeon: Fransisco Hertz, MD;  Location: [facility] CATH LAB;  Service: Cardiovascular;  Laterality: N/A;  . Percutaneous stent intervention Right 03/25/2013    Procedure: PERCUTANEOUS STENT INTERVENTION;  Surgeon: Fransisco Hertz, MD;  Location: [facility] CATH LAB;  Service: Cardiovascular;  Laterality: Right;  icast stent to rt common iliac artery  . Yag laser application Left 02/20/2015    Procedure: YAG LASER APPLICATION;  Surgeon: Susa Simmonds, MD;  Location: [facility] ORS;  Service: Ophthalmology;  Laterality: Left;    Social History   Social History  . Marital Status: Widowed    Spouse Name: N/A  . Number of Children: N/A  . Years of Education: N/A   Occupational History  . Not on file.   Social History Main Topics  . Smoking status: Former Smoker    Types: Cigarettes    Quit date: 02/27/2013  . Smokeless tobacco: Never Used  . Alcohol Use: No  . Drug Use: No  . Sexual Activity: Not on file   Other Topics Concern  . Not on file   Social History Narrative    Family History  Problem Relation Age of Onset  . Cancer Father   . Diabetes Father   . Hypertension Father     Current Outpatient Prescriptions on File Prior to Visit  Medication Sig Dispense Refill  . ALPRAZolam (XANAX) 0.5 MG tablet Take 0.5 mg by mouth daily.    Marland Kitchen aspirin 81 MG tablet Take 81 mg by mouth daily.    . clopidogrel (PLAVIX) 75 MG tablet Take 1 tablet (75 mg total) by mouth daily. 30 tablet 11  . lisinopril-hydrochlorothiazide (PRINZIDE,ZESTORETIC) 20-12.5 MG per tablet Take 1 tablet by mouth daily.    . traMADol (ULTRAM) 50 MG tablet Take 1 tablet by mouth 3 (three) times daily as needed.    . traZODone (DESYREL) 50 MG tablet Take 50 mg by  mouth at bedtime.    Marland Kitchen buPROPion (WELLBUTRIN SR) 150 MG 12 hr tablet Take 150 mg by mouth 2 (two) times daily.    . citalopram (CELEXA) 20 MG tablet Take 1 tablet by mouth daily.    Marland Kitchen etodolac (LODINE) 400 MG tablet Take 400 mg by mouth 2 (two) times daily.    Marland Kitchen lovastatin (MEVACOR) 20 MG tablet Take 20 mg by mouth at bedtime.    . Multiple Vitamin (MULTIVITAMIN WITH MINERALS) TABS Take 1 tablet by mouth daily.    Marland Kitchen zolpidem (AMBIEN) 10 MG tablet Take 1 tablet (10 mg total) by mouth at bedtime as needed for sleep. 30 tablet 0   No current facility-administered medications on file prior to visit.    No Known Allergies  REVIEW OF SYSTEMS:  (Positives checked otherwise negative)  CARDIOVASCULAR:    chest pain,  chest pressure,  palpitations,  shortness of breath when laying flat,  shortness of breath with exertion,   pain in feet when walking,  pain in feet when laying flat,  history of blood clot in veins (DVT),  history of phlebitis,  swelling in legs,  varicose veins  PULMONARY:   productive cough,  asthma,  wheezing  NEUROLOGIC:   weakness in arms or legs,  numbness in arms or legs,  difficulty speaking or slurred speech,  temporary loss of vision in one eye,  dizziness  HEMATOLOGIC:   bleeding problems,  problems with blood clotting too easily  MUSCULOSKEL:   joint pain,  joint swelling  GASTROINTEST:   vomiting blood,  blood in stool     GENITOURINARY:   burning with urination,  blood in urine  PSYCHIATRIC:   history of major depression  INTEGUMENTARY:   rashes,  ulcers  CONSTITUTIONAL:   fever,  chills  For VQI Use Only  PRE-ADM LIVING: Home  AMB STATUS: Ambulatory  CAD Sx: None  PRIOR CHF: None  STRESS TEST:  No,  Normal,  + ischemia,  + MI,  Both   Physical Examination  Filed Vitals:   04/28/15 1131 04/28/15 1132  BP: 187/101 174/99  Pulse: 52   Temp: 97.7 F (36.5 C)   TempSrc: Oral   Height:  (1.549 m)   Weight: 132 lb 4.8 oz (60.011 kg)   SpO2: 99%    Body mass index is 25.01 kg/(m^2).  General: A&O x 3, WD, thin  Head: [place]/AT  Ear/Nose/Throat: Hearing grossly intact, nares w/o erythema or drainage, oropharynx w/o Erythema/Exudate, Mallampati score: 3  Eyes: PERRLA, EOMI  Neck: Supple, no nuchal rigidity, no palpable LAD  Pulmonary: Sym exp, good air movt, CTAB, no rales, rhonchi, & wheezing  Cardiac: RRR, Nl S1, S2, no Murmurs, rubs or gallops Vascular: Vessel Right Left  Radial Palpable Palpable  Brachial Palpable Palpable  Carotid Palpable, without bruit Palpable, without bruit  Aorta  Not palpable N/A  Femoral Palpable Palpable  Popliteal Not  palpable Not palpable  PT Faintly Palpable Not Palpable  DP Not Palpable Not Palpable   Gastrointestinal: soft, NTND, -G/R, - HSM, - masses, - CVAT B  Musculoskeletal: M/S 5/5 throughout , Extremities without ischemic changes   Neurologic: CN 2-12 intact , Pain and light touch intact in extremities , Motor exam as listed above  Psychiatric: Judgment intact, Mood & affect appropriate for pt's clinical situation  Dermatologic: See M/S exam for extremity exam, no rashes otherwise noted  Lymph : No Cervical, Axillary, or Inguinal lymphadenopathy    Non-Invasive  Vascular Imaging  L CAROTID DUPLEX (Date: 04/28/2015):   L ICA stenosis: 40-59% (115/42 c/s)  L VA: patent and antegrade  L ECA (382/40 c/s)  Outside Studies/Documentation 10 pages of outside documents were reviewed including: outpatient PCP chart .  Medical Decision Making  Adam Mcclain is a 64 y.o. male who presents with: asx LICA stenosis 40-59%, R>>L lifestyle limiting claudication, cigarette smoking   Pt has been lost to follow up for his PAD since his procedure.  I will need to follow up for his surveillance studies.  In regards to his B carotid duplex, I do not have the exam available, so I can't tell if there was a mix-up in the ECA and ICA reporting.  On carotid duplex today, the L ECA appears to be diseased with L ICA <70%.  Based on the patient's vascular studies and examination, I have offered the patient: annual B carotid duplex, BLE ABI, and aortoiliac duplex.  I discussed in depth with the patient the nature of atherosclerosis, and emphasized the importance of maximal medical management including strict control of blood pressure, blood glucose, and lipid levels, obtaining regular exercise, antiplatelet agents, and cessation of smoking.   The patient is currently on a statin: Mevacor.  The patient is currently on an anti-platelet: Plavix, ASA.  The patient is aware that without maximal medical  management the underlying atherosclerotic disease process will progress, limiting the benefit of any interventions.  Thank you for allowing [place] to participate in this patient's care.  Leonides Sake, MD [facility] [place] Office: [phone] Pager: [phone]  04/28/2015, 12:08 PM

## 2015-05-01 NOTE — Addendum Note (Signed)
Addended by: Adria Dill L on: 05/01/2015 10:31 AM   Modules accepted: Orders

## 2015-09-01 DIAGNOSIS — J449 Chronic obstructive pulmonary disease, unspecified: Secondary | ICD-10-CM | POA: Diagnosis not present

## 2015-09-01 DIAGNOSIS — I6529 Occlusion and stenosis of unspecified carotid artery: Secondary | ICD-10-CM | POA: Diagnosis not present

## 2015-09-01 DIAGNOSIS — I10 Essential (primary) hypertension: Secondary | ICD-10-CM | POA: Diagnosis not present

## 2015-09-01 DIAGNOSIS — F331 Major depressive disorder, recurrent, moderate: Secondary | ICD-10-CM | POA: Diagnosis not present

## 2015-09-01 DIAGNOSIS — E782 Mixed hyperlipidemia: Secondary | ICD-10-CM | POA: Diagnosis not present

## 2015-09-20 DIAGNOSIS — N183 Chronic kidney disease, stage 3 (moderate): Secondary | ICD-10-CM | POA: Diagnosis not present

## 2015-09-20 DIAGNOSIS — F331 Major depressive disorder, recurrent, moderate: Secondary | ICD-10-CM | POA: Diagnosis not present

## 2015-09-20 DIAGNOSIS — F17210 Nicotine dependence, cigarettes, uncomplicated: Secondary | ICD-10-CM | POA: Diagnosis not present

## 2015-09-20 DIAGNOSIS — J301 Allergic rhinitis due to pollen: Secondary | ICD-10-CM | POA: Diagnosis not present

## 2015-09-20 DIAGNOSIS — J449 Chronic obstructive pulmonary disease, unspecified: Secondary | ICD-10-CM | POA: Diagnosis not present

## 2015-09-20 DIAGNOSIS — E782 Mixed hyperlipidemia: Secondary | ICD-10-CM | POA: Diagnosis not present

## 2015-09-20 DIAGNOSIS — I10 Essential (primary) hypertension: Secondary | ICD-10-CM | POA: Diagnosis not present

## 2016-05-03 ENCOUNTER — Encounter (HOSPITAL_COMMUNITY): Payer: Medicare Other

## 2016-05-03 ENCOUNTER — Ambulatory Visit: Payer: Medicare Other | Admitting: Vascular Surgery

## 2016-05-07 ENCOUNTER — Encounter: Payer: Self-pay | Admitting: Vascular Surgery

## 2016-05-09 NOTE — Progress Notes (Signed)
Established Carotid Patient  History of Present Illness  Adam Mcclain is a 65 y.o. ([birth year]) male who presents with chief complaint: bad toe nail on R foot.  Previous carotid studies demonstrated: RICA ?70% stenosis, LICA 40-59% stenosis.  Patient has no history of TIA or stroke symptom.  The patient has never had amaurosis fugax or monocular blindness.  The patient has never had facial drooping or hemiplegia.  The patient has never had receptive or expressive aphasia.   He continues to be asx from his carotid disease  This patient is also s/p R CIA PTA+S (03/25/13).  Pt also had L IIA stenosis >90% and L inferior pole artery stenosis >90%.  Pt currently denies any intermittent claudication or difficulties with urinating.  He continues to have some ED.  He denies any leg sx  Pt continues to intermittently smoke.   The patient's PMH, PSH, SH, and FamHx are unchanged from 04/28/15.  Current Outpatient Prescriptions  Medication Sig Dispense Refill  . ALPRAZolam (XANAX) 0.5 MG tablet Take 0.5 mg by mouth daily.    Marland Kitchen. aspirin 81 MG tablet Take 81 mg by mouth daily.    Marland Kitchen. atorvastatin (LIPITOR) 40 MG tablet Take 40 mg by mouth daily.     Marland Kitchen. buPROPion (WELLBUTRIN SR) 150 MG 12 hr tablet Take 150 mg by mouth 2 (two) times daily.    . citalopram (CELEXA) 20 MG tablet Take 1 tablet by mouth daily.    . clopidogrel (PLAVIX) 75 MG tablet Take 1 tablet (75 mg total) by mouth daily. 30 tablet 11  . etodolac (LODINE) 400 MG tablet Take 400 mg by mouth 2 (two) times daily.    Marland Kitchen. lisinopril-hydrochlorothiazide (PRINZIDE,ZESTORETIC) 20-12.5 MG per tablet Take 1 tablet by mouth daily.    Marland Kitchen. lovastatin (MEVACOR) 20 MG tablet Take 20 mg by mouth at bedtime.    . Multiple Vitamin (MULTIVITAMIN WITH MINERALS) TABS Take 1 tablet by mouth daily.    . traMADol (ULTRAM) 50 MG tablet Take 1 tablet by mouth 3 (three) times daily as needed.    . traZODone (DESYREL) 50 MG tablet Take 50 mg by mouth at bedtime.      No current facility-administered medications for this visit.     No Known Allergies  On ROS today: no intermittent claudication , no rest pain   Physical Examination  Vitals:   05/10/16 1100 05/10/16 1103  BP: (!) 224/106 (!) 220/100  Pulse: (!) 47   Resp: 16   Temp: 97 F (36.1 C)   TempSrc: Oral   SpO2: 100%   Weight: 130 lb (59 kg)   Height: 5\' 1"  (1.549 m)    Body mass index is 24.56 kg/m.  General: A&O x 3, WDWN  Eyes: PERRLA, EOMI  Neck: Supple, no nuchal rigidity, no palpable LAD  Pulmonary: Sym exp, good air movt, CTAB, no rales, rhonchi, & wheezing  Cardiac: RRR, Nl S1, S2, no Murmurs, rubs or gallops  Vascular: Vessel Right Left  Radial Palpable Faintly Palpable  Brachial Palpable Palpable  Carotid Palpable, without bruit Palpable, without bruit  Aorta Not palpable N/A  Femoral Palpable Palpable  Popliteal Not palpable Not palpable  PT Not Palpable Not Palpable  DP Not Palpable Not Palpable   Gastrointestinal: soft, NTND, no G/R, no HSM, no masses, no CVAT B  Musculoskeletal: M/S 5/5 throughout , Extremities without ischemic changes   Neurologic: CN 2-12 intact , Pain and light touch intact in extremities , Motor exam as listed  above   Non-Invasive Vascular Imaging  CAROTID DUPLEX (Date: 05/10/2016 ):   R ICA stenosis: 80-99%  R VA: patent and antegrade  L ICA stenosis: 40-59%  L VA: patent and antegrade  ABI (Date: 05/10/2016)  R:   ABI: 0.67,   DP: mono,   PT: mono,   TBI: 0.40  L:   ABI: 0.77,   DP: mono,   PT: mono,   TBI: 0.55  Aortoiliac duplex (05/10/2016)  Ao: 70 c/s  R CIA stent: 315-364 c/s (5.6)   Medical Decision Making  Adam Gladerry F Culliver is a 65 y.o. 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  In regards to his R ICA stenosis, there has been marked increase in PSV since his last evaluation.    I have ordered a CTA Neck to determine if the carotid duplex is accurate, as the standard of care for an asx ICA >80% would be, R CEA.  I am also sending him to Cardiology for preop evaluation in the event the CTA Neck confirms the R ICA stenosis >80%.  I discussed in depth with the patient the nature of atherosclerosis, and emphasized the importance of maximal medical management including strict control of blood pressure, blood glucose, and lipid levels, antiplatelet agents, obtaining regular exercise, and cessation of smoking.    The patient is aware that without maximal medical management the underlying atherosclerotic disease process will progress, limiting the benefit of any interventions. The patient is currently on a statin: Lipitor. The patient is currently on an anti-platelet: ASA. The patient is going to follow up in the next 2-4 weeks with the CTA and cardiology evaluation. Once his R ICA stenosis is address, will need to address the likely restenosis of the R CIA.  Thank you for allowing us to participate in this patient's care.   Leonides SakeBrian Kadden Osterhout, MD, FACS [facility] Office: [phone] Pager: [phone]

## 2016-05-10 ENCOUNTER — Other Ambulatory Visit: Payer: Self-pay | Admitting: Vascular Surgery

## 2016-05-10 ENCOUNTER — Ambulatory Visit (HOSPITAL_COMMUNITY)
Admission: RE | Admit: 2016-05-10 | Discharge: 2016-05-10 | Disposition: A | Discharge status: Home / Self Care | Payer: Commercial Managed Care - HMO | Source: Ambulatory Visit | Attending: Vascular Surgery | Admitting: Vascular Surgery

## 2016-05-10 ENCOUNTER — Ambulatory Visit (INDEPENDENT_AMBULATORY_CARE_PROVIDER_SITE_OTHER): Payer: Commercial Managed Care - HMO | Admitting: Vascular Surgery

## 2016-05-10 ENCOUNTER — Ambulatory Visit (INDEPENDENT_AMBULATORY_CARE_PROVIDER_SITE_OTHER)
Admission: RE | Admit: 2016-05-10 | Discharge: 2016-05-10 | Disposition: A | Discharge status: Home / Self Care | Payer: Commercial Managed Care - HMO | Source: Ambulatory Visit | Attending: Vascular Surgery | Admitting: Vascular Surgery

## 2016-05-10 ENCOUNTER — Encounter: Payer: Self-pay | Admitting: Vascular Surgery

## 2016-05-10 VITALS — BP 220/100 | HR 47 | Temp 97.0°F | Resp 16 | Ht 61.0 in | Wt 130.0 lb

## 2016-05-10 DIAGNOSIS — I70219 Atherosclerosis of native arteries of extremities with intermittent claudication, unspecified extremity: Secondary | ICD-10-CM

## 2016-05-10 DIAGNOSIS — I10 Essential (primary) hypertension: Secondary | ICD-10-CM | POA: Diagnosis not present

## 2016-05-10 DIAGNOSIS — E785 Hyperlipidemia, unspecified: Secondary | ICD-10-CM | POA: Insufficient documentation

## 2016-05-10 DIAGNOSIS — I70213 Atherosclerosis of native arteries of extremities with intermittent claudication, bilateral legs: Secondary | ICD-10-CM

## 2016-05-10 DIAGNOSIS — I6523 Occlusion and stenosis of bilateral carotid arteries: Secondary | ICD-10-CM | POA: Diagnosis not present

## 2016-05-10 DIAGNOSIS — Z95820 Peripheral vascular angioplasty status with implants and grafts: Secondary | ICD-10-CM | POA: Diagnosis not present

## 2016-05-10 DIAGNOSIS — I779 Disorder of arteries and arterioles, unspecified: Secondary | ICD-10-CM | POA: Diagnosis not present

## 2016-05-10 DIAGNOSIS — I739 Peripheral vascular disease, unspecified: Secondary | ICD-10-CM

## 2016-05-10 DIAGNOSIS — F17200 Nicotine dependence, unspecified, uncomplicated: Secondary | ICD-10-CM | POA: Insufficient documentation

## 2016-05-10 LAB — VAS US CAROTID
LCCADDIAS: 21 cm/s
LCCAPDIAS: 18 cm/s
LCCAPSYS: 90 cm/s
LEFT ECA DIAS: 27 cm/s
Left CCA dist sys: 79 cm/s
RCCAPDIAS: 14 cm/s
RIGHT CCA MID DIAS: 15 cm/s
RIGHT ECA DIAS: -8 cm/s
Right CCA prox sys: 77 cm/s

## 2016-05-13 DIAGNOSIS — B351 Tinea unguium: Secondary | ICD-10-CM | POA: Diagnosis not present

## 2016-05-13 DIAGNOSIS — Z6820 Body mass index (BMI) 20.0-20.9, adult: Secondary | ICD-10-CM | POA: Diagnosis not present

## 2016-05-14 ENCOUNTER — Encounter: Payer: Self-pay | Admitting: Vascular Surgery

## 2016-05-15 ENCOUNTER — Other Ambulatory Visit: Payer: Self-pay | Admitting: *Deleted

## 2016-05-15 ENCOUNTER — Telehealth: Payer: Self-pay | Admitting: Vascular Surgery

## 2016-05-15 DIAGNOSIS — Z01812 Encounter for preprocedural laboratory examination: Secondary | ICD-10-CM

## 2016-05-15 DIAGNOSIS — I739 Peripheral vascular disease, unspecified: Secondary | ICD-10-CM

## 2016-05-15 NOTE — Telephone Encounter (Signed)
I spoke with the patient regarding the appts Dr.Chen ordered. Cardiology appt w/ Dr.[place] on 06/11/16 at 3pm.[facility] location in [place]. CT scan 05/27/16 at 9am @ [facility]. See Dr.Chen on 06/14/16 at 8:30am here at [facility]. I also mailed this information to the patient. awt

## 2016-05-21 DIAGNOSIS — I10 Essential (primary) hypertension: Secondary | ICD-10-CM | POA: Diagnosis not present

## 2016-05-21 DIAGNOSIS — J449 Chronic obstructive pulmonary disease, unspecified: Secondary | ICD-10-CM | POA: Diagnosis not present

## 2016-05-21 DIAGNOSIS — F17210 Nicotine dependence, cigarettes, uncomplicated: Secondary | ICD-10-CM | POA: Diagnosis not present

## 2016-05-21 DIAGNOSIS — F331 Major depressive disorder, recurrent, moderate: Secondary | ICD-10-CM | POA: Diagnosis not present

## 2016-05-21 DIAGNOSIS — I6529 Occlusion and stenosis of unspecified carotid artery: Secondary | ICD-10-CM | POA: Diagnosis not present

## 2016-05-21 DIAGNOSIS — E782 Mixed hyperlipidemia: Secondary | ICD-10-CM | POA: Diagnosis not present

## 2016-05-21 DIAGNOSIS — N183 Chronic kidney disease, stage 3 (moderate): Secondary | ICD-10-CM | POA: Diagnosis not present

## 2016-05-27 ENCOUNTER — Ambulatory Visit (HOSPITAL_COMMUNITY)
Admission: RE | Admit: 2016-05-27 | Discharge: 2016-05-27 | Disposition: A | Discharge status: Home / Self Care | Payer: Commercial Managed Care - HMO | Source: Ambulatory Visit | Attending: Vascular Surgery | Admitting: Vascular Surgery

## 2016-05-27 DIAGNOSIS — I672 Cerebral atherosclerosis: Secondary | ICD-10-CM | POA: Insufficient documentation

## 2016-05-27 DIAGNOSIS — I739 Peripheral vascular disease, unspecified: Secondary | ICD-10-CM | POA: Diagnosis not present

## 2016-05-27 DIAGNOSIS — I70219 Atherosclerosis of native arteries of extremities with intermittent claudication, unspecified extremity: Secondary | ICD-10-CM

## 2016-05-27 DIAGNOSIS — I6523 Occlusion and stenosis of bilateral carotid arteries: Secondary | ICD-10-CM | POA: Diagnosis not present

## 2016-05-27 DIAGNOSIS — I708 Atherosclerosis of other arteries: Secondary | ICD-10-CM | POA: Insufficient documentation

## 2016-05-27 LAB — POCT I-STAT CREATININE: CREATININE: 1.4 mg/dL — AB (ref 0.61–1.24)

## 2016-05-27 MED ORDER — IOPAMIDOL (ISOVUE-370) INJECTION 76%
75.0000 mL | Freq: Once | INTRAVENOUS | Status: AC | PRN
  Administered 2016-05-27: 75 mL via INTRAVENOUS

## 2016-06-03 DIAGNOSIS — I10 Essential (primary) hypertension: Secondary | ICD-10-CM | POA: Diagnosis not present

## 2016-06-03 DIAGNOSIS — F331 Major depressive disorder, recurrent, moderate: Secondary | ICD-10-CM | POA: Diagnosis not present

## 2016-06-03 DIAGNOSIS — Z0001 Encounter for general adult medical examination with abnormal findings: Secondary | ICD-10-CM | POA: Diagnosis not present

## 2016-06-03 DIAGNOSIS — Z23 Encounter for immunization: Secondary | ICD-10-CM | POA: Diagnosis not present

## 2016-06-03 DIAGNOSIS — J301 Allergic rhinitis due to pollen: Secondary | ICD-10-CM | POA: Diagnosis not present

## 2016-06-03 DIAGNOSIS — F17210 Nicotine dependence, cigarettes, uncomplicated: Secondary | ICD-10-CM | POA: Diagnosis not present

## 2016-06-03 DIAGNOSIS — Z6820 Body mass index (BMI) 20.0-20.9, adult: Secondary | ICD-10-CM | POA: Diagnosis not present

## 2016-06-03 DIAGNOSIS — E782 Mixed hyperlipidemia: Secondary | ICD-10-CM | POA: Diagnosis not present

## 2016-06-05 ENCOUNTER — Ambulatory Visit: Payer: Commercial Managed Care - HMO | Admitting: Vascular Surgery

## 2016-06-07 ENCOUNTER — Encounter: Payer: Self-pay | Admitting: Vascular Surgery

## 2016-06-10 NOTE — Progress Notes (Signed)
Established Carotid Patient  History of Present Illness  Adam Mcclain is a 65 y.o. (27-May-1951) male who presents with chief complaint: follow up from CTA Neck.  The patient has had no CVA or TIA sx since last visit.  Previous carotid studies demonstrated: RICA 80-99% stenosis, LICA 40-59% stenosis.  Patient has no history of TIA or stroke symptom.  The patient has never had amaurosis fugax or monocular blindness.  The patient has never had facial drooping or hemiplegia.  The patient has never had receptive or expressive aphasia.  The patient was sent to CTA Neck to evaluate his anatomy and degree of stenosis.  Pt has been seen by Cardiology and has a low risk nuclear study.  Past Medical History:  Diagnosis Date  . Atrial fibrillation (HCC)   . Chronic back pain   . Hypertension   . Stroke [facility](HCC)     Past Surgical History:  Procedure Laterality Date  . ABDOMINAL AORTAGRAM  03-25-13   with Stent  . ABDOMINAL AORTAGRAM N/A 03/25/2013   Procedure: ABDOMINAL Ronny FlurryAORTAGRAM;  Surgeon: Fransisco HertzBrian L Adaira Centola, MD;  [facility];  Service: Cardiovascular;  Laterality: N/A;  . CATARACT EXTRACTION Bilateral   . CHOLECYSTECTOMY    . EYE SURGERY    . KIDNEY STONE SURGERY    . LOWER EXTREMITY ANGIOGRAM N/A 03/25/2013   Procedure: LOWER EXTREMITY ANGIOGRAM;  Surgeon: Fransisco HertzBrian L Florentine Diekman, MD;  [facility];  Service: Cardiovascular;  Laterality: N/A;  . PERCUTANEOUS STENT INTERVENTION Right 03/25/2013   Procedure: PERCUTANEOUS STENT INTERVENTION;  Surgeon: Fransisco HertzBrian L Jaimie Pippins, MD;  [facility];  Service: Cardiovascular;  Laterality: Right;  icast stent to rt common iliac artery  . YAG LASER APPLICATION Left 02/20/2015   Procedure: YAG LASER APPLICATION;  Surgeon: Susa Simmondsarroll F Haines, MD;  Location: [facility];  Service: Ophthalmology;  Laterality: Left;    Social History   Social History  . Marital status: Widowed    Spouse name: N/A  . Number of children: N/A  . Years of education: N/A    Occupational History  . Not on file.   Social History Main Topics  . Smoking status: Current Every Day Smoker    Types: Cigars  . Smokeless tobacco: Never Used     Comment: 10 Cigars per day  . Alcohol use No  . Drug use: No  . Sexual activity: Not on file   Other Topics Concern  . Not on file   Social History Narrative  . No narrative on file    Family History  Problem Relation Age of Onset  . Cancer Father   . Diabetes Father   . Hypertension Father     Current Outpatient Prescriptions  Medication Sig Dispense Refill  . ALPRAZolam (XANAX) 0.5 MG tablet Take 0.5 mg by mouth daily.    Marland Kitchen. aspirin 81 MG tablet Take 81 mg by mouth daily.    Marland Kitchen. atorvastatin (LIPITOR) 40 MG tablet Take 40 mg by mouth daily.     Marland Kitchen. buPROPion (WELLBUTRIN SR) 150 MG 12 hr tablet Take 150 mg by mouth 2 (two) times daily.    . citalopram (CELEXA) 20 MG tablet Take 1 tablet by mouth daily.    . clopidogrel (PLAVIX) 75 MG tablet Take 1 tablet (75 mg total) by mouth daily. 30 tablet 11  . etodolac (LODINE) 400 MG tablet Take 400 mg by mouth 2 (two) times daily.    Marland Kitchen. lisinopril-hydrochlorothiazide (PRINZIDE,ZESTORETIC) 20-12.5 MG per tablet Take  2 tablets by mouth daily.     Marland Kitchen. lovastatin (MEVACOR) 20 MG tablet Take 20 mg by mouth at bedtime.    . Multiple Vitamin (MULTIVITAMIN WITH MINERALS) TABS Take 1 tablet by mouth daily.    . traMADol (ULTRAM) 50 MG tablet Take 1 tablet by mouth 3 (three) times daily as needed.    . traZODone (DESYREL) 50 MG tablet Take 50 mg by mouth at bedtime.     No current facility-administered medications for this visit.      No Known Allergies   REVIEW OF SYSTEMS:  (Positives checked otherwise negative)  CARDIOVASCULAR:   [ ]  chest pain,  [ ]  chest pressure,  [ ]  palpitations,  [ ]  shortness of breath when laying flat,  [ ]  shortness of breath with exertion,   [ ]  pain in feet when walking,  [ ]  pain in feet when laying flat, [ ]  history of blood clot in veins  (DVT),  [ ]  history of phlebitis,  [ ]  swelling in legs,  [ ]  varicose veins  PULMONARY:   [ ]  productive cough,  [ ]  asthma,  [ ]  wheezing  NEUROLOGIC:   [ ]  weakness in arms or legs,  [ ]  numbness in arms or legs,  [ ]  difficulty speaking or slurred speech,  [ ]  temporary loss of vision in one eye,  [ ]  dizziness  HEMATOLOGIC:   [ ]  bleeding problems,  [ ]  problems with blood clotting too easily  MUSCULOSKEL:   [ ]  joint pain, [ ]  joint swelling  GASTROINTEST:   [ ]  vomiting blood,  [ ]  blood in stool     GENITOURINARY:   [ ]  burning with urination,  [ ]  blood in urine  PSYCHIATRIC:   [ ]  history of major depression  INTEGUMENTARY:   [ ]  rashes,  [ ]  ulcers  CONSTITUTIONAL:   [ ]  fever,  [ ]  chills    Physical Examination  There were no vitals filed for this visit.   There is no height or weight on file to calculate BMI.  General: A&O x 3, WDWN  Eyes: PERRLA, EOMI  Neck: Supple, no nuchal rigidity, no palpable LAD  Pulmonary: Sym exp, good air movt, CTAB, no rales, rhonchi, & wheezing  Cardiac: RRR, Nl S1, S2, no Murmurs, rubs or gallops  Vascular: Vessel Right Left  Radial Palpable Faintly Palpable  Brachial Palpable Palpable  Carotid Palpable, without bruit Palpable, without bruit  Aorta Not palpable N/A  Femoral Palpable Palpable  Popliteal Not palpable Not palpable  PT Not Palpable Not Palpable  DP Not Palpable Not Palpable   Gastrointestinal: soft, NTND, no G/R, no HSM, no masses, no CVAT B  Musculoskeletal: M/S 5/5 throughout , Extremities without ischemic changes   Neurologic: CN 2-12 intact , Pain and light touch intact in extremities , Motor exam as listed above  CTA Neck (05/27/16) Diffuse atherosclerotic vascular disease.  Severe stenosis in the right ICA bulb with minimal diameter of 1.5 mm at the distal bulb. Diameter stenosis measures 70%. Area stenosis would be greater than that.  50% stenosis of the left  ICA in the distal bulb.  Bilateral subclavian artery stenoses, 70% on the right and 50% on the left.  Right vertebral artery supplies only PICA. This vessel is diffusely disease to, narrowed by approximately 50% throughout the proximal 4 cm of the vessel.  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  Reviewing the CTA  Neck, R ICA stenosis appears to >80% and surgically accessible.  On the reconstructions, a 3-4 cm segment of diffuse calcification is evident involving this segment.  The L ICA stenosis appears to be <50%.     Medical Decision Making  MAYRA BRAHM is a 65 y.o. male who presents with: asx R ICA stenosis >80%.   Given the diffuse calcification in the R ICA, the PSV likely is lower than the actual in-vessel velocity, thus the actually stenosis in the lumen is likely significantly >80%.  Based on the patient's vascular studies and examination, I have offered the patient: R CEA. I discussed with the patient the risks, benefits, and alternatives to carotid endarterectomy.   The patient is aware that the risks of carotid endarterectomy include but are not limited to: bleeding, infection, stroke, myocardial infarction, death, cranial nerve injuries both temporary and permanent, neck hematoma, possible airway compromise, labile blood pressure post-operatively, cerebral hyperperfusion syndrome, and possible need for additional interventions in the future.  The patient is aware of the risks and agrees to proceed forward with the procedure.  He is scheduled for 11 OCT 17.  I discussed in depth with the patient the nature of atherosclerosis, and emphasized the importance of maximal medical management including strict control of blood pressure, blood glucose, and lipid levels, antiplatelet agents, obtaining regular exercise, and cessation of smoking.    The patient is aware that without maximal medical management the underlying atherosclerotic disease  process will progress, limiting the benefit of any interventions. The patient is currently on a statin: Mevacor. The patient is currently on an anti-platelet: ASA.  Thank you for allowing [place] to participate in this patient's care.   Leonides Sake, MD, FACS [facility] [place] Office: [phone] Pager: [phone]

## 2016-06-11 ENCOUNTER — Encounter: Payer: Self-pay | Admitting: Cardiovascular Disease

## 2016-06-11 ENCOUNTER — Ambulatory Visit (INDEPENDENT_AMBULATORY_CARE_PROVIDER_SITE_OTHER): Payer: Commercial Managed Care - HMO | Admitting: Cardiovascular Disease

## 2016-06-11 VITALS — BP 223/93 | HR 62 | Ht 68.0 in | Wt 133.0 lb

## 2016-06-11 DIAGNOSIS — I10 Essential (primary) hypertension: Secondary | ICD-10-CM

## 2016-06-11 DIAGNOSIS — E785 Hyperlipidemia, unspecified: Secondary | ICD-10-CM | POA: Diagnosis not present

## 2016-06-11 DIAGNOSIS — Z716 Tobacco abuse counseling: Secondary | ICD-10-CM

## 2016-06-11 DIAGNOSIS — Z01810 Encounter for preprocedural cardiovascular examination: Secondary | ICD-10-CM

## 2016-06-11 NOTE — Patient Instructions (Addendum)
Medication Instructions:  INCREASE YOUR LISINOPRIL HCT TO 2 TABLETS EVERY MORNING  Labwork: NONE  Testing/Procedures: NONE  Follow-Up: Your physician recommends that you schedule a follow-up appointment ZO:XWRUin:WITH PHYSICIAN  OR PA/NP IN [place] OFFICE IN 2 WEEKS  If you need a refill on your cardiac medications before your next appointment, please call your pharmacy.

## 2016-06-11 NOTE — Progress Notes (Signed)
Cardiology Office Note   Date:  06/11/2016   ID:  HOLDYN POYSER, DOB [birth year], MRN [id number]  PCP:  Donzetta Sprung, MD  Cardiologist:   Chilton Si, MD  Vascular Surgeon: Leonides Sake, MD Chief Complaint  Patient presents with  . New Patient (Initial Visit)    loss of balance.     History of Present Illness: Adam Mcclain is a 65 y.o. male with hypertension, paroxysmal atrial fibrillation, moderate carotid stenosis, hyperlipidemia, who presents for pre-operative risk assessment prior to R carotid endarterectomy.  Mr. Want saw Dr. Imogene Burn on 05/10/16 where his blood pressure was noted to be 224/106.  He had not taken his blood pressure medication and was advised to take it immediately upon leaving the office.  He was asymptomatic.  He had Carotid Dopplers 05/10/16 that revealed 80-99% right internal carotid artery stenosis and 40-59% left ICA stenosis.  Follow-up CT angiogram of the neck revealed 70% right ICA stenosis and 50% left ICA stenosis. He also had ABI that were 0.67 on the right and 0.77 on the left.  Dr. Imogene Burn referred him to cardiology for presurgical risk assessment.  Mr. Chui has been feeling well.  He denies chest pain or shortness of breath.  He walks to the neighborhood store daily,which takes 10-15 minutes each way.  He denies exertional symptoms. He is able to walk up a flight of stairs without chest pain or shortness of breath.  He is mostly limited by knee pain.  He sometimes has dizziness upon standing but denies syncope.  He hasn't noted any lower extremity edema, orthopnea, or PND.  He continues to smoke cigars daily but is trying to cut back.  He reports that his lipids were recently checked by his primary care doctor and were at goal.  Past Medical History:  Diagnosis Date  . Atrial fibrillation (HCC)   . Chronic back pain   . Hypertension   . Stroke [facility])     Past Surgical History:  Procedure Laterality Date  . ABDOMINAL AORTAGRAM  03-25-13   with Stent  .  ABDOMINAL AORTAGRAM N/A 03/25/2013   Procedure: ABDOMINAL Ronny Flurry;  Surgeon: Fransisco Hertz, MD;  Location: [facility] CATH LAB;  Service: Cardiovascular;  Laterality: N/A;  . CATARACT EXTRACTION Bilateral   . CHOLECYSTECTOMY    . EYE SURGERY    . KIDNEY STONE SURGERY    . LOWER EXTREMITY ANGIOGRAM N/A 03/25/2013   Procedure: LOWER EXTREMITY ANGIOGRAM;  Surgeon: Fransisco Hertz, MD;  Location: [facility] CATH LAB;  Service: Cardiovascular;  Laterality: N/A;  . PERCUTANEOUS STENT INTERVENTION Right 03/25/2013   Procedure: PERCUTANEOUS STENT INTERVENTION;  Surgeon: Fransisco Hertz, MD;  Location: [facility] CATH LAB;  Service: Cardiovascular;  Laterality: Right;  icast stent to rt common iliac artery  . YAG LASER APPLICATION Left 02/20/2015   Procedure: YAG LASER APPLICATION;  Surgeon: Susa Simmonds, MD;  Location: [facility] ORS;  Service: Ophthalmology;  Laterality: Left;     Current Outpatient Prescriptions  Medication Sig Dispense Refill  . ALPRAZolam (XANAX) 0.5 MG tablet Take 0.5 mg by mouth daily.    Marland Kitchen aspirin 81 MG tablet Take 81 mg by mouth daily.    Marland Kitchen atorvastatin (LIPITOR) 40 MG tablet Take 40 mg by mouth daily.     Marland Kitchen buPROPion (WELLBUTRIN SR) 150 MG 12 hr tablet Take 150 mg by mouth 2 (two) times daily.    . citalopram (CELEXA) 20 MG tablet Take 1 tablet by mouth daily.    Marland Kitchen  clopidogrel (PLAVIX) 75 MG tablet Take 1 tablet (75 mg total) by mouth daily. 30 tablet 11  . etodolac (LODINE) 400 MG tablet Take 400 mg by mouth 2 (two) times daily.    Marland Kitchen. lisinopril-hydrochlorothiazide (PRINZIDE,ZESTORETIC) 20-12.5 MG per tablet Take 2 tablets by mouth daily.     Marland Kitchen. lovastatin (MEVACOR) 20 MG tablet Take 20 mg by mouth at bedtime.    . Multiple Vitamin (MULTIVITAMIN WITH MINERALS) TABS Take 1 tablet by mouth daily.    . traMADol (ULTRAM) 50 MG tablet Take 1 tablet by mouth 3 (three) times daily as needed.    . traZODone (DESYREL) 50 MG tablet Take 50 mg by mouth at bedtime.     No current facility-administered medications for  this visit.     Allergies:   Review of patient's allergies indicates no known allergies.    Social History:  The patient  reports that he has been smoking Cigars.  He has never used smokeless tobacco. He reports that he does not drink alcohol or use drugs.   Family History:  The patient's family history includes Cancer in his father; Diabetes in his father; Hypertension in his father.    ROS:  Please see the history of present illness.   Otherwise, review of systems are positive for none.   All other systems are reviewed and negative.    PHYSICAL EXAM: VS:  BP (!) 223/93   Pulse 62   Ht 5\' 8"  (1.727 m)   Wt 133 lb (60.3 kg)   BMI 20.22 kg/m  , BMI Body mass index is 20.22 kg/m. GENERAL:  Well appearing HEENT:  Pupils equal round and reactive, fundi not visualized, oral mucosa unremarkable NECK:  No jugular venous distention, waveform within normal limits, carotid upstroke brisk and symmetric, no bruits, no thyromegaly.  Missing several teeth. LYMPHATICS:  No cervical adenopathy LUNGS:  Clear to auscultation bilaterally HEART:  RRR.  PMI not displaced or sustained,S1 and S2 within normal limits, no S3, no S4, no clicks, no rubs, no murmurs ABD:  Flat, positive bowel sounds normal in frequency in pitch, no bruits, no rebound, no guarding, no midline pulsatile mass, no hepatomegaly, no splenomegaly EXT:  2 plus pulses throughout, no edema, no cyanosis no clubbing SKIN:  No rashes no nodules NEURO:  Cranial nerves II through XII grossly intact, motor grossly intact throughout PSYCH:  Cognitively intact, oriented to person place and time  EKG:  EKG is ordered today. The ekg ordered today demonstrates sinus rhythm rate 62 bpm.  LVH with repolarization abnormalities  Carotid Dopplers 05/10/16: 80-99% right internal carotid artery stenosis and 40-59% left ICA stenosis. ABI 05/10/16:  Right 0.67, left 0.77  Recent Labs: 05/27/2016: Creatinine, Ser 1.40    Lipid Panel No results found  for: CHOL, TRIG, HDL, CHOLHDL, VLDL, LDLCALC, LDLDIRECT    Wt Readings from Last 3 Encounters:  06/11/16 133 lb (60.3 kg)  05/10/16 130 lb (59 kg)  04/28/15 132 lb 4.8 oz (60 kg)      ASSESSMENT AND PLAN:  # Pre-operative risk assessment: Mr. Maryfrances BunnellVaden has conflicting data between his carotid Doppler and CT-A.  He follows up with Dr. Imogene Burnhen this week to determine whether he will need surgery. If he does require surgery, he is at low risk.  The patient does not have any unstable cardiac conditions.  Upon evaluation today, he can achieve 4 METs or greater without anginal symptoms.  According to [facility] and AHA guidelines, he requires no further cardiac workup prior  to his noncardiac surgery and should be at acceptable risk.  his NSQIP risk of peri-procedural MI or cardiac arrest is 0.66%.  Our service is available as necessary in the perioperative period.  However, his blood pressure needs to be much better-controlled before surgery.    # Hypertension: Blood pressure is very poorly-controlled. He reports that he has not yet taken his medication. He typically takes in the evening. He will increase his lisinopril/hydrochlorothiazide to 40/25 mg daily. Mr. Staniszewski lives in [place] and prefers to follow-up there. In 2 weeks we will schedule a follow-up appointment at which time he can have his blood pressure checked again and check a basic metabolic panel.   # Hyperlipidemia:  Reportedly within normal limits. This is managed by his PCP.  Continue atorvastatin.   # Tobacco abuse: Mr. Friday was encouraged to quit smoking cigars. He expressed understanding and plans to continue cutting back by one of the time.   Current medicines are reviewed at length with the patient today.  The patient does not have concerns regarding medicines.  The following changes have been made:  Increase lisinopril/hctz  Labs/ tests ordered today include:  No orders of the defined types were placed in this  encounter.    Disposition:   FU in [facility] [place] clinic in 2 weeks.    This note was written with the assistance of speech recognition software.  Please excuse any transcriptional errors.  Signed, Codi Kertz C. Duke Salvia, MD, [facility]  06/11/2016 5:09 PM    [facility]

## 2016-06-14 ENCOUNTER — Ambulatory Visit (INDEPENDENT_AMBULATORY_CARE_PROVIDER_SITE_OTHER): Payer: Commercial Managed Care - HMO | Admitting: Vascular Surgery

## 2016-06-14 ENCOUNTER — Other Ambulatory Visit: Payer: Self-pay

## 2016-06-14 ENCOUNTER — Encounter: Payer: Self-pay | Admitting: Vascular Surgery

## 2016-06-14 VITALS — BP 167/92 | HR 52 | Temp 97.4°F | Resp 16 | Ht 68.0 in | Wt 131.0 lb

## 2016-06-14 DIAGNOSIS — I779 Disorder of arteries and arterioles, unspecified: Secondary | ICD-10-CM | POA: Diagnosis not present

## 2016-06-14 DIAGNOSIS — I739 Peripheral vascular disease, unspecified: Principal | ICD-10-CM

## 2016-06-24 ENCOUNTER — Encounter (HOSPITAL_COMMUNITY)
Admission: RE | Admit: 2016-06-24 | Discharge: 2016-06-24 | Disposition: A | Discharge status: Home / Self Care | Payer: Medicare Other | Source: Ambulatory Visit | Attending: Vascular Surgery | Admitting: Vascular Surgery

## 2016-06-24 ENCOUNTER — Encounter (HOSPITAL_COMMUNITY): Payer: Self-pay

## 2016-06-24 ENCOUNTER — Other Ambulatory Visit (HOSPITAL_COMMUNITY): Payer: Self-pay | Admitting: *Deleted

## 2016-06-24 ENCOUNTER — Telehealth: Payer: Self-pay | Admitting: Vascular Surgery

## 2016-06-24 DIAGNOSIS — I6521 Occlusion and stenosis of right carotid artery: Secondary | ICD-10-CM | POA: Diagnosis not present

## 2016-06-24 DIAGNOSIS — Z87442 Personal history of urinary calculi: Secondary | ICD-10-CM | POA: Diagnosis not present

## 2016-06-24 DIAGNOSIS — I739 Peripheral vascular disease, unspecified: Secondary | ICD-10-CM | POA: Diagnosis not present

## 2016-06-24 DIAGNOSIS — Z8249 Family history of ischemic heart disease and other diseases of the circulatory system: Secondary | ICD-10-CM | POA: Diagnosis not present

## 2016-06-24 DIAGNOSIS — I10 Essential (primary) hypertension: Secondary | ICD-10-CM | POA: Diagnosis not present

## 2016-06-24 DIAGNOSIS — K219 Gastro-esophageal reflux disease without esophagitis: Secondary | ICD-10-CM | POA: Diagnosis not present

## 2016-06-24 DIAGNOSIS — M549 Dorsalgia, unspecified: Secondary | ICD-10-CM | POA: Diagnosis not present

## 2016-06-24 DIAGNOSIS — I4891 Unspecified atrial fibrillation: Secondary | ICD-10-CM | POA: Diagnosis not present

## 2016-06-24 DIAGNOSIS — Z7982 Long term (current) use of aspirin: Secondary | ICD-10-CM | POA: Diagnosis not present

## 2016-06-24 DIAGNOSIS — Z833 Family history of diabetes mellitus: Secondary | ICD-10-CM | POA: Diagnosis not present

## 2016-06-24 DIAGNOSIS — Z7902 Long term (current) use of antithrombotics/antiplatelets: Secondary | ICD-10-CM | POA: Diagnosis not present

## 2016-06-24 DIAGNOSIS — M1990 Unspecified osteoarthritis, unspecified site: Secondary | ICD-10-CM | POA: Diagnosis not present

## 2016-06-24 DIAGNOSIS — D62 Acute posthemorrhagic anemia: Secondary | ICD-10-CM | POA: Diagnosis not present

## 2016-06-24 DIAGNOSIS — G8929 Other chronic pain: Secondary | ICD-10-CM | POA: Diagnosis not present

## 2016-06-24 DIAGNOSIS — Z9114 Patient's other noncompliance with medication regimen: Secondary | ICD-10-CM | POA: Diagnosis not present

## 2016-06-24 DIAGNOSIS — Z79899 Other long term (current) drug therapy: Secondary | ICD-10-CM | POA: Diagnosis not present

## 2016-06-24 DIAGNOSIS — Z8673 Personal history of transient ischemic attack (TIA), and cerebral infarction without residual deficits: Secondary | ICD-10-CM | POA: Diagnosis not present

## 2016-06-24 HISTORY — DX: Personal history of urinary calculi: Z87.442

## 2016-06-24 HISTORY — DX: Cardiac arrhythmia, unspecified: I49.9

## 2016-06-24 HISTORY — DX: Unspecified osteoarthritis, unspecified site: M19.90

## 2016-06-24 HISTORY — DX: Gastro-esophageal reflux disease without esophagitis: K21.9

## 2016-06-24 LAB — URINALYSIS, ROUTINE W REFLEX MICROSCOPIC
Bilirubin Urine: NEGATIVE
Glucose, UA: NEGATIVE mg/dL
Hgb urine dipstick: NEGATIVE
Ketones, ur: NEGATIVE mg/dL
Leukocytes, UA: NEGATIVE
NITRITE: NEGATIVE
PH: 5.5 (ref 5.0–8.0)
Protein, ur: NEGATIVE mg/dL
SPECIFIC GRAVITY, URINE: 1.018 (ref 1.005–1.030)

## 2016-06-24 LAB — COMPREHENSIVE METABOLIC PANEL
ALBUMIN: 4.1 g/dL (ref 3.5–5.0)
ALT: 17 U/L (ref 17–63)
AST: 19 U/L (ref 15–41)
Alkaline Phosphatase: 96 U/L (ref 38–126)
Anion gap: 9 (ref 5–15)
BUN: 12 mg/dL (ref 6–20)
CHLORIDE: 102 mmol/L (ref 101–111)
CO2: 29 mmol/L (ref 22–32)
CREATININE: 1.46 mg/dL — AB (ref 0.61–1.24)
Calcium: 9.4 mg/dL (ref 8.9–10.3)
GFR calc Af Amer: 56 mL/min — ABNORMAL LOW (ref >60)
GFR, EST NON AFRICAN AMERICAN: 48 mL/min — AB (ref >60)
GLUCOSE: 76 mg/dL (ref 65–99)
POTASSIUM: 4 mmol/L (ref 3.5–5.1)
Sodium: 140 mmol/L (ref 135–145)
TOTAL PROTEIN: 6.6 g/dL (ref 6.5–8.1)
Total Bilirubin: 0.6 mg/dL (ref 0.3–1.2)

## 2016-06-24 LAB — APTT: APTT: 35 s (ref 24–36)

## 2016-06-24 LAB — TYPE AND SCREEN
ABO/RH(D): O POS
Antibody Screen: NEGATIVE

## 2016-06-24 LAB — CBC
HCT: 49.7 % (ref 39.0–52.0)
Hemoglobin: 17 g/dL (ref 13.0–17.0)
MCH: 32.7 pg (ref 26.0–34.0)
MCHC: 34.2 g/dL (ref 30.0–36.0)
MCV: 95.6 fL (ref 78.0–100.0)
PLATELETS: 138 10*3/uL — AB (ref 150–400)
RBC: 5.2 MIL/uL (ref 4.22–5.81)
RDW: 13.1 % (ref 11.5–15.5)
WBC: 6.8 10*3/uL (ref 4.0–10.5)

## 2016-06-24 LAB — SURGICAL PCR SCREEN
MRSA, PCR: NEGATIVE
Staphylococcus aureus: NEGATIVE

## 2016-06-24 LAB — ABO/RH: ABO/RH(D): O POS

## 2016-06-24 LAB — PROTIME-INR
INR: 1.08
PROTHROMBIN TIME: 14.1 s (ref 11.4–15.2)

## 2016-06-24 NOTE — Pre-Procedure Instructions (Signed)
    Adam Mcclain  06/24/2016      [facility] [address] - [place], [place] - [address] [place] [address] Phone: [phone] Fax: [phone]  [facility] [place] [place] - [address] [place] [address] Phone: [phone] Fax: [phone]  [facility] [address] - [place], [place] - [address] [place] [address] Phone: [phone] Fax: [phone]    Your procedure is scheduled on 06-26-2016   Wednesday   [facility].M.   Call this number if you have problems the morning of surgery:  [phone]   Remember:  Do not eat food or drink liquids after midnight.   Take these medicines the morning of surgery with A SIP OF WATER Alprazolam(Xanax),Aspirin,Bupropion(Wellbutrin),Plavix,tramadol if needed           Continue Aspirin and plavix      Do not wear jewelry, .  Do not wear lotions, powders, or perfumes, or deoderant.  Do not shave 48 hours prior to surgery.  Men may shave face and neck.   Do not bring valuables to the hospital.  [facility] any belongings or valuables.  Contacts, dentures or bridgework may not be worn into surgery.  Leave your suitcase in the car.  After surgery it may be brought to your room.  For patients admitted to the hospital, discharge time will be determined by your treatment team.  Patients discharged the day of surgery will not be allowed to drive home.    Special instructions:  SEE ATTACHED SHEET FOR INSTRUCTIONS ON CHG SHOWERS

## 2016-06-24 NOTE — Telephone Encounter (Signed)
I called [facility] and left a voice msg for Mathews ArgyleRenee Dill [phone]).  Pt was supposed to give us a call regarding his new insurance he was supposed to be getting the 1st of October.  Pt has an outpatient procedure scheduled on 06/26/16.  Alano Blasco E., LPN.

## 2016-06-24 NOTE — Progress Notes (Signed)
Pt. Is poor historian ,difficult to understand speech.

## 2016-06-25 ENCOUNTER — Ambulatory Visit: Payer: Commercial Managed Care - HMO | Admitting: Adult Health

## 2016-06-25 ENCOUNTER — Encounter (HOSPITAL_COMMUNITY): Payer: Self-pay | Admitting: Anesthesiology

## 2016-06-25 NOTE — Anesthesia Preprocedure Evaluation (Addendum)
Anesthesia Evaluation  Patient identified by MRN, date of birth, ID band Patient awake    Reviewed: Allergy & Precautions, NPO status , Patient's Chart, lab work & pertinent test results  Airway Mallampati: III  TM Distance: >3 FB Neck ROM: Full    Dental  (+) Poor Dentition, Missing   Pulmonary pneumonia, resolved, Current Smoker,    Pulmonary exam normal breath sounds clear to auscultation       Cardiovascular hypertension, Pt. on medications + Peripheral Vascular Disease  + dysrhythmias Atrial Fibrillation  Rhythm:Regular Rate:Bradycardia  Right carotid stenosis    Neuro/Psych CVA    GI/Hepatic Neg liver ROS, GERD  Medicated and Controlled,  Endo/Other    Renal/GU Renal InsufficiencyRenal disease  negative genitourinary   Musculoskeletal  (+) Arthritis ,   Abdominal   Peds  Hematology Thrombocytopenia-mild   Anesthesia Other Findings   Reproductive/Obstetrics negative OB ROS                            Lab Results  Component Value Date   WBC 6.8 06/24/2016   HGB 17.0 06/24/2016   HCT 49.7 06/24/2016   MCV 95.6 06/24/2016   PLT 138 (L) 06/24/2016   Lab Results  Component Value Date   CALCIUM 9.4 06/24/2016   CAION 1.16 03/25/2013     Chemistry      Component Value Date/Time   NA 140 06/24/2016 1002   K 4.0 06/24/2016 1002   CL 102 06/24/2016 1002   CO2 29 06/24/2016 1002   BUN 12 06/24/2016 1002   CREATININE 1.46 (H) 06/24/2016 1002      Component Value Date/Time   CALCIUM 9.4 06/24/2016 1002   ALKPHOS 96 06/24/2016 1002   AST 19 06/24/2016 1002   ALT 17 06/24/2016 1002   BILITOT 0.6 06/24/2016 1002     This SmartLink has not been configured with any valid records.   Lab Results  Component Value Date   INR 1.08 06/24/2016    Anesthesia Physical Anesthesia Plan  ASA: III  Anesthesia Plan:    Post-op Pain Management:    Induction: Intravenous  Airway  Management Planned:   Additional Equipment: Arterial line  Intra-op Plan:   Post-operative Plan: Extubation in OR  Informed Consent: I have reviewed the patients History and Physical, chart, labs and discussed the procedure including the risks, benefits and alternatives for the proposed anesthesia with the patient or authorized representative who has indicated his/her understanding and acceptance.   Dental advisory given  Plan Discussed with:   Anesthesia Plan Comments:         Anesthesia Quick Evaluation

## 2016-06-26 ENCOUNTER — Inpatient Hospital Stay (HOSPITAL_COMMUNITY): Payer: Medicare Other | Admitting: Anesthesiology

## 2016-06-26 ENCOUNTER — Encounter (HOSPITAL_COMMUNITY): Payer: Self-pay | Admitting: *Deleted

## 2016-06-26 ENCOUNTER — Encounter (HOSPITAL_COMMUNITY)
Admission: RE | Disposition: A | Discharge status: Home / Self Care | Payer: Self-pay | Source: Ambulatory Visit | Attending: Vascular Surgery

## 2016-06-26 ENCOUNTER — Inpatient Hospital Stay (HOSPITAL_COMMUNITY)
Admission: RE | Admit: 2016-06-26 | Discharge: 2016-06-27 | DRG: 039 | Disposition: A | Discharge status: Home / Self Care | Payer: Medicare Other | Source: Ambulatory Visit | Attending: Vascular Surgery | Admitting: Vascular Surgery

## 2016-06-26 DIAGNOSIS — M549 Dorsalgia, unspecified: Secondary | ICD-10-CM | POA: Diagnosis present

## 2016-06-26 DIAGNOSIS — I10 Essential (primary) hypertension: Secondary | ICD-10-CM | POA: Diagnosis present

## 2016-06-26 DIAGNOSIS — I779 Disorder of arteries and arterioles, unspecified: Secondary | ICD-10-CM | POA: Diagnosis present

## 2016-06-26 DIAGNOSIS — Z7982 Long term (current) use of aspirin: Secondary | ICD-10-CM

## 2016-06-26 DIAGNOSIS — G8929 Other chronic pain: Secondary | ICD-10-CM | POA: Diagnosis present

## 2016-06-26 DIAGNOSIS — K219 Gastro-esophageal reflux disease without esophagitis: Secondary | ICD-10-CM | POA: Diagnosis present

## 2016-06-26 DIAGNOSIS — Z87442 Personal history of urinary calculi: Secondary | ICD-10-CM

## 2016-06-26 DIAGNOSIS — Z9114 Patient's other noncompliance with medication regimen: Secondary | ICD-10-CM

## 2016-06-26 DIAGNOSIS — I6521 Occlusion and stenosis of right carotid artery: Secondary | ICD-10-CM | POA: Diagnosis not present

## 2016-06-26 DIAGNOSIS — Z79899 Other long term (current) drug therapy: Secondary | ICD-10-CM

## 2016-06-26 DIAGNOSIS — Z833 Family history of diabetes mellitus: Secondary | ICD-10-CM

## 2016-06-26 DIAGNOSIS — I739 Peripheral vascular disease, unspecified: Secondary | ICD-10-CM | POA: Diagnosis present

## 2016-06-26 DIAGNOSIS — Z8673 Personal history of transient ischemic attack (TIA), and cerebral infarction without residual deficits: Secondary | ICD-10-CM

## 2016-06-26 DIAGNOSIS — F17290 Nicotine dependence, other tobacco product, uncomplicated: Secondary | ICD-10-CM | POA: Diagnosis present

## 2016-06-26 DIAGNOSIS — Z8249 Family history of ischemic heart disease and other diseases of the circulatory system: Secondary | ICD-10-CM

## 2016-06-26 DIAGNOSIS — Z7902 Long term (current) use of antithrombotics/antiplatelets: Secondary | ICD-10-CM

## 2016-06-26 DIAGNOSIS — I4891 Unspecified atrial fibrillation: Secondary | ICD-10-CM | POA: Diagnosis present

## 2016-06-26 HISTORY — PX: ENDARTERECTOMY: SHX5162

## 2016-06-26 HISTORY — PX: PATCH ANGIOPLASTY: SHX6230

## 2016-06-26 SURGERY — ENDARTERECTOMY, CAROTID
Anesthesia: General | Site: Neck | Laterality: Right

## 2016-06-26 MED ORDER — OXYCODONE-ACETAMINOPHEN 5-325 MG PO TABS: 1.0000 | ORAL_TABLET | ORAL | Status: DC | PRN

## 2016-06-26 MED ORDER — SODIUM CHLORIDE 0.9 % IV SOLN
INTRAVENOUS | Status: DC
  Administered 2016-06-26: 13:00:00 via INTRAVENOUS

## 2016-06-26 MED ORDER — SODIUM CHLORIDE 0.9 % IV SOLN
INTRAVENOUS | Status: DC | PRN
  Administered 2016-06-26: 09:00:00

## 2016-06-26 MED ORDER — GLYCOPYRROLATE 0.2 MG/ML IJ SOLN
INTRAMUSCULAR | Status: DC | PRN
  Administered 2016-06-26: 0.1 mg via INTRAVENOUS
  Administered 2016-06-26: 0.4 mg via INTRAVENOUS
  Administered 2016-06-26: .1 mg via INTRAVENOUS

## 2016-06-26 MED ORDER — MEPERIDINE HCL 25 MG/ML IJ SOLN: 6.2500 mg | INTRAMUSCULAR | Status: DC | PRN

## 2016-06-26 MED ORDER — FLEET ENEMA 7-19 GM/118ML RE ENEM: 1.0000 | ENEMA | Freq: Once | RECTAL | Status: DC | PRN

## 2016-06-26 MED ORDER — ASPIRIN 81 MG PO TABS: 81.0000 mg | ORAL_TABLET | Freq: Every day | ORAL | Status: DC

## 2016-06-26 MED ORDER — HEPARIN SODIUM (PORCINE) 1000 UNIT/ML IJ SOLN
INTRAMUSCULAR | Status: DC | PRN
  Administered 2016-06-26: 6.5 mL via INTRAVENOUS
  Administered 2016-06-26: 2 mL via INTRAVENOUS

## 2016-06-26 MED ORDER — PHENYLEPHRINE HCL 10 MG/ML IJ SOLN
INTRAVENOUS | Status: DC | PRN
  Administered 2016-06-26: 25 ug/min via INTRAVENOUS
  Administered 2016-06-26: 15 ug/min via INTRAVENOUS

## 2016-06-26 MED ORDER — BUPROPION HCL ER (SR) 150 MG PO TB12
150.0000 mg | ORAL_TABLET | Freq: Two times a day (BID) | ORAL | Status: DC
  Administered 2016-06-26 – 2016-06-27 (×2): 150 mg via ORAL
  Filled 2016-06-26 (×3): qty 1

## 2016-06-26 MED ORDER — CITALOPRAM HYDROBROMIDE 20 MG PO TABS
20.0000 mg | ORAL_TABLET | Freq: Every day | ORAL | Status: DC
  Administered 2016-06-27: 20 mg via ORAL
  Filled 2016-06-26: qty 1

## 2016-06-26 MED ORDER — PROPOFOL 10 MG/ML IV BOLUS
INTRAVENOUS | Status: DC | PRN
  Administered 2016-06-26: 10 mg via INTRAVENOUS
  Administered 2016-06-26: 120 mg via INTRAVENOUS
  Administered 2016-06-26: 10 mg via INTRAVENOUS
  Administered 2016-06-26: 80 mg via INTRAVENOUS

## 2016-06-26 MED ORDER — ONDANSETRON HCL 4 MG/2ML IJ SOLN
INTRAMUSCULAR | Status: DC | PRN
  Administered 2016-06-26: 4 mg via INTRAVENOUS

## 2016-06-26 MED ORDER — DEXAMETHASONE SODIUM PHOSPHATE 10 MG/ML IJ SOLN
INTRAMUSCULAR | Status: AC
  Filled 2016-06-26: qty 1

## 2016-06-26 MED ORDER — POLYETHYLENE GLYCOL 3350 17 G PO PACK: 17.0000 g | PACK | Freq: Every day | ORAL | Status: DC | PRN

## 2016-06-26 MED ORDER — CHLORHEXIDINE GLUCONATE CLOTH 2 % EX PADS: 6.0000 | MEDICATED_PAD | Freq: Once | CUTANEOUS | Status: DC

## 2016-06-26 MED ORDER — HYDROMORPHONE HCL 1 MG/ML IJ SOLN
INTRAMUSCULAR | Status: AC
  Filled 2016-06-26: qty 1

## 2016-06-26 MED ORDER — ONDANSETRON HCL 4 MG/2ML IJ SOLN: 4.0000 mg | Freq: Four times a day (QID) | INTRAMUSCULAR | Status: DC | PRN

## 2016-06-26 MED ORDER — NEOSTIGMINE METHYLSULFATE 5 MG/5ML IV SOSY
PREFILLED_SYRINGE | INTRAVENOUS | Status: AC
  Filled 2016-06-26: qty 5

## 2016-06-26 MED ORDER — ACETAMINOPHEN 325 MG RE SUPP: 325.0000 mg | RECTAL | Status: DC | PRN

## 2016-06-26 MED ORDER — LACTATED RINGERS IV SOLN
INTRAVENOUS | Status: DC | PRN
Start: 2016-06-26 — End: 2016-06-26
  Administered 2016-06-26: 09:00:00 via INTRAVENOUS

## 2016-06-26 MED ORDER — SODIUM CHLORIDE 0.9 % IV SOLN
0.0125 ug/kg/min | INTRAVENOUS | Status: DC
  Filled 2016-06-26: qty 1000

## 2016-06-26 MED ORDER — LIDOCAINE HCL (PF) 1 % IJ SOLN
INTRAMUSCULAR | Status: DC | PRN
  Administered 2016-06-26: .2 mL

## 2016-06-26 MED ORDER — EPHEDRINE 5 MG/ML INJ
INTRAVENOUS | Status: AC
  Filled 2016-06-26: qty 10

## 2016-06-26 MED ORDER — NITROGLYCERIN IN D5W 200-5 MCG/ML-% IV SOLN
5.0000 ug/min | INTRAVENOUS | Status: DC
Start: 2016-06-26 — End: 2016-06-26
  Filled 2016-06-26: qty 250

## 2016-06-26 MED ORDER — PHENOL 1.4 % MT LIQD: 1.0000 | OROMUCOSAL | Status: DC | PRN

## 2016-06-26 MED ORDER — LABETALOL HCL 5 MG/ML IV SOLN
INTRAVENOUS | Status: DC | PRN
  Administered 2016-06-26 (×3): 10 mg via INTRAVENOUS

## 2016-06-26 MED ORDER — LABETALOL HCL 5 MG/ML IV SOLN
INTRAVENOUS | Status: AC
  Filled 2016-06-26: qty 4

## 2016-06-26 MED ORDER — LIDOCAINE HCL (CARDIAC) 20 MG/ML IV SOLN
INTRAVENOUS | Status: DC | PRN
  Administered 2016-06-26: 40 mg via INTRAVENOUS

## 2016-06-26 MED ORDER — ENOXAPARIN SODIUM 30 MG/0.3ML ~~LOC~~ SOLN
30.0000 mg | SUBCUTANEOUS | Status: DC
  Filled 2016-06-26: qty 0.3

## 2016-06-26 MED ORDER — CLOPIDOGREL BISULFATE 75 MG PO TABS
75.0000 mg | ORAL_TABLET | Freq: Every day | ORAL | Status: DC
  Administered 2016-06-27: 75 mg via ORAL
  Filled 2016-06-26: qty 1

## 2016-06-26 MED ORDER — ASPIRIN EC 325 MG PO TBEC
325.0000 mg | DELAYED_RELEASE_TABLET | Freq: Every day | ORAL | Status: DC
  Administered 2016-06-27: 325 mg via ORAL
  Filled 2016-06-26 (×2): qty 1

## 2016-06-26 MED ORDER — SODIUM CHLORIDE 0.9 % IV SOLN: 500.0000 mL | Freq: Once | INTRAVENOUS | Status: DC | PRN

## 2016-06-26 MED ORDER — PHENYLEPHRINE HCL 10 MG/ML IJ SOLN
INTRAMUSCULAR | Status: DC | PRN
  Administered 2016-06-26: 40 ug via INTRAVENOUS

## 2016-06-26 MED ORDER — NEOSTIGMINE METHYLSULFATE 10 MG/10ML IV SOLN
INTRAVENOUS | Status: DC | PRN
  Administered 2016-06-26: 3 mg via INTRAVENOUS

## 2016-06-26 MED ORDER — SODIUM CHLORIDE 0.9 % IV SOLN: INTRAVENOUS | Status: DC

## 2016-06-26 MED ORDER — DEXTROSE 5 % IV SOLN
1.5000 g | INTRAVENOUS | Status: AC
  Administered 2016-06-26: 1.5 g via INTRAVENOUS
  Filled 2016-06-26: qty 1.5

## 2016-06-26 MED ORDER — PROPOFOL 10 MG/ML IV BOLUS
INTRAVENOUS | Status: AC
  Filled 2016-06-26: qty 20

## 2016-06-26 MED ORDER — ACETAMINOPHEN 325 MG PO TABS: 325.0000 mg | ORAL_TABLET | ORAL | Status: DC | PRN

## 2016-06-26 MED ORDER — BISACODYL 10 MG RE SUPP: 10.0000 mg | Freq: Every day | RECTAL | Status: DC | PRN

## 2016-06-26 MED ORDER — SODIUM CHLORIDE 0.9 % IV SOLN
0.0125 ug/kg/min | INTRAVENOUS | Status: AC
  Administered 2016-06-26: .1 ug/kg/min via INTRAVENOUS
  Administered 2016-06-26: .05 ug/kg/min via INTRAVENOUS
  Administered 2016-06-26: 12:00:00 via INTRAVENOUS
  Filled 2016-06-26: qty 2000

## 2016-06-26 MED ORDER — LISINOPRIL-HYDROCHLOROTHIAZIDE 20-12.5 MG PO TABS: 1.0000 | ORAL_TABLET | Freq: Every day | ORAL | Status: DC

## 2016-06-26 MED ORDER — PROTAMINE SULFATE 10 MG/ML IV SOLN
INTRAVENOUS | Status: DC | PRN
  Administered 2016-06-26: 40 mg via INTRAVENOUS
  Administered 2016-06-26: 10 mg via INTRAVENOUS

## 2016-06-26 MED ORDER — FENTANYL CITRATE (PF) 100 MCG/2ML IJ SOLN
INTRAMUSCULAR | Status: AC
  Filled 2016-06-26: qty 2

## 2016-06-26 MED ORDER — LIDOCAINE 2% (20 MG/ML) 5 ML SYRINGE
INTRAMUSCULAR | Status: AC
  Filled 2016-06-26: qty 5

## 2016-06-26 MED ORDER — PHENYLEPHRINE 40 MCG/ML (10ML) SYRINGE FOR IV PUSH (FOR BLOOD PRESSURE SUPPORT)
PREFILLED_SYRINGE | INTRAVENOUS | Status: AC
  Filled 2016-06-26: qty 10

## 2016-06-26 MED ORDER — HEMOSTATIC AGENTS (NO CHARGE) OPTIME
TOPICAL | Status: DC | PRN
  Administered 2016-06-26: 1 via TOPICAL

## 2016-06-26 MED ORDER — LACTATED RINGERS IV SOLN
INTRAVENOUS | Status: DC | PRN
Start: 2016-06-26 — End: 2016-06-26
  Administered 2016-06-26 (×2): via INTRAVENOUS

## 2016-06-26 MED ORDER — DEXTRAN 40 IN SALINE 10-0.9 % IV SOLN
INTRAVENOUS | Status: AC
  Filled 2016-06-26: qty 500

## 2016-06-26 MED ORDER — LIDOCAINE HCL (PF) 1 % IJ SOLN
INTRAMUSCULAR | Status: AC
  Filled 2016-06-26: qty 30

## 2016-06-26 MED ORDER — HYDRALAZINE HCL 20 MG/ML IJ SOLN: 5.0000 mg | INTRAMUSCULAR | Status: DC | PRN

## 2016-06-26 MED ORDER — ONDANSETRON HCL 4 MG/2ML IJ SOLN
INTRAMUSCULAR | Status: AC
  Filled 2016-06-26: qty 2

## 2016-06-26 MED ORDER — PROMETHAZINE HCL 25 MG/ML IJ SOLN: 6.2500 mg | INTRAMUSCULAR | Status: DC | PRN

## 2016-06-26 MED ORDER — PANTOPRAZOLE SODIUM 40 MG PO TBEC
40.0000 mg | DELAYED_RELEASE_TABLET | Freq: Every day | ORAL | Status: DC
  Administered 2016-06-27: 40 mg via ORAL
  Filled 2016-06-26: qty 1

## 2016-06-26 MED ORDER — MAGNESIUM SULFATE 2 GM/50ML IV SOLN: 2.0000 g | Freq: Every day | INTRAVENOUS | Status: DC | PRN

## 2016-06-26 MED ORDER — DEXTROSE 5 % IV SOLN
1.5000 g | Freq: Two times a day (BID) | INTRAVENOUS | Status: AC
  Administered 2016-06-26 – 2016-06-27 (×2): 1.5 g via INTRAVENOUS
  Filled 2016-06-26 (×3): qty 1.5

## 2016-06-26 MED ORDER — DEXTRAN 40 IN SALINE 10-0.9 % IV SOLN
INTRAVENOUS | Status: DC | PRN
  Administered 2016-06-26: 500 mL

## 2016-06-26 MED ORDER — ALUM & MAG HYDROXIDE-SIMETH 200-200-20 MG/5ML PO SUSP: 15.0000 mL | ORAL | Status: DC | PRN

## 2016-06-26 MED ORDER — GUAIFENESIN-DM 100-10 MG/5ML PO SYRP: 15.0000 mL | ORAL_SOLUTION | ORAL | Status: DC | PRN

## 2016-06-26 MED ORDER — GLYCOPYRROLATE 0.2 MG/ML IV SOSY
PREFILLED_SYRINGE | INTRAVENOUS | Status: AC
Start: 2016-06-26 — End: 2016-06-26
  Filled 2016-06-26: qty 3

## 2016-06-26 MED ORDER — HYDROCHLOROTHIAZIDE 12.5 MG PO CAPS
12.5000 mg | ORAL_CAPSULE | Freq: Every day | ORAL | Status: DC
  Administered 2016-06-27: 12.5 mg via ORAL
  Filled 2016-06-26: qty 1

## 2016-06-26 MED ORDER — MORPHINE SULFATE (PF) 2 MG/ML IV SOLN: 2.0000 mg | INTRAVENOUS | Status: DC | PRN

## 2016-06-26 MED ORDER — EPHEDRINE SULFATE 50 MG/ML IJ SOLN
INTRAMUSCULAR | Status: DC | PRN
  Administered 2016-06-26 (×2): 10 mg via INTRAVENOUS

## 2016-06-26 MED ORDER — MIDAZOLAM HCL 2 MG/2ML IJ SOLN
INTRAMUSCULAR | Status: AC
  Filled 2016-06-26: qty 2

## 2016-06-26 MED ORDER — ARTIFICIAL TEARS OP OINT
TOPICAL_OINTMENT | OPHTHALMIC | Status: AC
  Filled 2016-06-26: qty 3.5

## 2016-06-26 MED ORDER — METOPROLOL TARTRATE 5 MG/5ML IV SOLN: 2.0000 mg | INTRAVENOUS | Status: DC | PRN

## 2016-06-26 MED ORDER — LISINOPRIL 20 MG PO TABS
20.0000 mg | ORAL_TABLET | Freq: Every day | ORAL | Status: DC
  Administered 2016-06-27: 20 mg via ORAL
  Filled 2016-06-26: qty 1

## 2016-06-26 MED ORDER — LABETALOL HCL 5 MG/ML IV SOLN: 10.0000 mg | INTRAVENOUS | Status: DC | PRN

## 2016-06-26 MED ORDER — DOCUSATE SODIUM 100 MG PO CAPS
100.0000 mg | ORAL_CAPSULE | Freq: Every day | ORAL | Status: DC
  Administered 2016-06-27: 100 mg via ORAL
  Filled 2016-06-26: qty 1

## 2016-06-26 MED ORDER — TRAZODONE HCL 50 MG PO TABS
50.0000 mg | ORAL_TABLET | Freq: Every day | ORAL | Status: DC
  Administered 2016-06-26: 50 mg via ORAL
  Filled 2016-06-26: qty 1

## 2016-06-26 MED ORDER — VECURONIUM BROMIDE 10 MG IV SOLR
INTRAVENOUS | Status: DC | PRN
  Administered 2016-06-26: 3 mg via INTRAVENOUS
  Administered 2016-06-26: 5 mg via INTRAVENOUS
  Administered 2016-06-26: 2 mg via INTRAVENOUS

## 2016-06-26 MED ORDER — ADULT MULTIVITAMIN W/MINERALS CH
1.0000 | ORAL_TABLET | Freq: Every day | ORAL | Status: DC
  Administered 2016-06-27: 1 via ORAL
  Filled 2016-06-26: qty 1

## 2016-06-26 MED ORDER — HYDROMORPHONE HCL 1 MG/ML IJ SOLN
0.2500 mg | INTRAMUSCULAR | Status: DC | PRN
  Administered 2016-06-26: 0.5 mg via INTRAVENOUS

## 2016-06-26 MED ORDER — POTASSIUM CHLORIDE CRYS ER 20 MEQ PO TBCR: 20.0000 meq | EXTENDED_RELEASE_TABLET | Freq: Every day | ORAL | Status: DC | PRN

## 2016-06-26 MED ORDER — DEXAMETHASONE SODIUM PHOSPHATE 10 MG/ML IJ SOLN
INTRAMUSCULAR | Status: DC | PRN
  Administered 2016-06-26: 10 mg via INTRAVENOUS

## 2016-06-26 MED ORDER — FENTANYL CITRATE (PF) 100 MCG/2ML IJ SOLN
INTRAMUSCULAR | Status: DC | PRN
  Administered 2016-06-26: 100 ug via INTRAVENOUS

## 2016-06-26 MED ORDER — ATORVASTATIN CALCIUM 40 MG PO TABS
40.0000 mg | ORAL_TABLET | Freq: Every day | ORAL | Status: DC
  Administered 2016-06-27: 40 mg via ORAL
  Filled 2016-06-26: qty 1

## 2016-06-26 MED ORDER — ALPRAZOLAM 0.5 MG PO TABS: 0.5000 mg | ORAL_TABLET | Freq: Every day | ORAL | Status: DC

## 2016-06-26 MED ORDER — 0.9 % SODIUM CHLORIDE (POUR BTL) OPTIME
TOPICAL | Status: DC | PRN
  Administered 2016-06-26 (×3): 1000 mL

## 2016-06-26 SURGICAL SUPPLY — 51 items
BAG DECANTER FOR FLEXI CONT (MISCELLANEOUS) ×2 IMPLANT
CANISTER SUCTION 2500CC (MISCELLANEOUS) ×2 IMPLANT
CATH ROBINSON RED A/P 18FR (CATHETERS) ×2 IMPLANT
CLIP TI MEDIUM 6 (CLIP) ×2 IMPLANT
CLIP TI WIDE RED SMALL 6 (CLIP) ×2 IMPLANT
COVER PROBE W GEL 5X96 (DRAPES) ×2 IMPLANT
CRADLE DONUT ADULT HEAD (MISCELLANEOUS) ×2 IMPLANT
DERMABOND ADVANCED (GAUZE/BANDAGES/DRESSINGS) ×1
DERMABOND ADVANCED .7 DNX12 (GAUZE/BANDAGES/DRESSINGS) ×1 IMPLANT
ELECT REM PT RETURN 9FT ADLT (ELECTROSURGICAL) ×2
ELECTRODE REM PT RTRN 9FT ADLT (ELECTROSURGICAL) ×1 IMPLANT
GAUZE SPONGE 2X2 8PLY STRL LF (GAUZE/BANDAGES/DRESSINGS) ×1 IMPLANT
GLOVE BIO SURGEON STRL SZ 6.5 (GLOVE) ×4 IMPLANT
GLOVE BIO SURGEON STRL SZ7 (GLOVE) ×4 IMPLANT
GLOVE BIOGEL PI IND STRL 6.5 (GLOVE) ×4 IMPLANT
GLOVE BIOGEL PI IND STRL 7.5 (GLOVE) ×1 IMPLANT
GLOVE BIOGEL PI INDICATOR 6.5 (GLOVE) ×4
GLOVE BIOGEL PI INDICATOR 7.5 (GLOVE) ×1
GOWN STRL REUS W/ TWL LRG LVL3 (GOWN DISPOSABLE) ×3 IMPLANT
GOWN STRL REUS W/TWL LRG LVL3 (GOWN DISPOSABLE) ×3
HEMOSTAT SPONGE AVITENE ULTRA (HEMOSTASIS) ×2 IMPLANT
IV ADAPTER SYR DOUBLE MALE LL (MISCELLANEOUS) IMPLANT
KIT BASIN OR (CUSTOM PROCEDURE TRAY) ×2 IMPLANT
KIT ROOM TURNOVER OR (KITS) ×2 IMPLANT
KIT SHUNT ARGYLE CAROTID ART 6 (VASCULAR PRODUCTS) ×2 IMPLANT
LIQUID BAND (GAUZE/BANDAGES/DRESSINGS) ×2 IMPLANT
NEEDLE HYPO 25GX1X1/2 BEV (NEEDLE) ×2 IMPLANT
NS IRRIG 1000ML POUR BTL (IV SOLUTION) ×6 IMPLANT
PACK CAROTID (CUSTOM PROCEDURE TRAY) ×2 IMPLANT
PAD ARMBOARD 7.5X6 YLW CONV (MISCELLANEOUS) ×4 IMPLANT
PATCH VASC XENOSURE 1CMX6CM (Vascular Products) ×1 IMPLANT
PATCH VASC XENOSURE 1X6 (Vascular Products) ×1 IMPLANT
SET COLLECT BLD 21X3/4 12 PB (MISCELLANEOUS) IMPLANT
SHUNT CAROTID BYPASS 10 (VASCULAR PRODUCTS) IMPLANT
SHUNT CAROTID BYPASS 12FRX15.5 (VASCULAR PRODUCTS) IMPLANT
SPONGE GAUZE 2X2 STER 10/PKG (GAUZE/BANDAGES/DRESSINGS) ×1
STOPCOCK 4 WAY LG BORE MALE ST (IV SETS) IMPLANT
SUT ETHILON 3 0 PS 1 (SUTURE) ×2 IMPLANT
SUT MNCRL AB 4-0 PS2 18 (SUTURE) ×2 IMPLANT
SUT PROLENE 6 0 BV (SUTURE) ×10 IMPLANT
SUT PROLENE 7 0 BV 1 (SUTURE) ×2 IMPLANT
SUT SILK 3 0 TIES 17X18 (SUTURE)
SUT SILK 3-0 18XBRD TIE BLK (SUTURE) IMPLANT
SUT VIC AB 3-0 SH 27 (SUTURE) ×1
SUT VIC AB 3-0 SH 27X BRD (SUTURE) ×1 IMPLANT
SYR TB 1ML LUER SLIP (SYRINGE) ×2 IMPLANT
SYSTEM CHEST DRAIN TLS 7FR (DRAIN) ×2 IMPLANT
TAPE CLOTH SURG 6X10 WHT LF (GAUZE/BANDAGES/DRESSINGS) ×2 IMPLANT
TUBING ART PRESS 48 MALE/FEM (TUBING) IMPLANT
TUBING EXTENTION W/L.L. (IV SETS) IMPLANT
WATER STERILE IRR 1000ML POUR (IV SOLUTION) ×2 IMPLANT

## 2016-06-26 NOTE — Transfer of Care (Signed)
Immediate Anesthesia Transfer of Care Note  Patient: Adam Mcclain  Procedure(s) Performed: Procedure(s): ENDARTERECTOMY CAROTID RIGHT (Right) PATCH ANGIOPLASTY RIGHT CAROTID ARTERY (Right)  Patient Location: PACU  Anesthesia Type:General  Level of Consciousness: awake, alert  and patient cooperative   Airway & Oxygen Therapy: Patient Spontanous Breathing and Patient connected to face mask oxygen  Post-op Assessment: Report given to RN, Post -op Vital signs reviewed and stable, Patient moving all extremities and Patient able to stick tongue midline  Post vital signs: stable  Last Vitals:  Vitals:   06/26/16 0713 06/26/16 1230  BP: (!) 193/94 138/79  Pulse:  (!) 50  Resp:  16  Temp:  36.6 C    Last Pain:  Vitals:   06/26/16 0650  TempSrc: Oral         Complications: No apparent anesthesia complications

## 2016-06-26 NOTE — Anesthesia Procedure Notes (Addendum)
Procedure Name: Intubation Date/Time: 06/26/2016 8:40 AM Performed by: Everlene BallsHAYES, Leilany Digeronimo T Pre-anesthesia Checklist: Patient identified, Emergency Drugs available, Suction available, Patient being monitored and Timeout performed Patient Re-evaluated:Patient Re-evaluated prior to inductionOxygen Delivery Method: Circle system utilized Preoxygenation: Pre-oxygenation with 100% oxygen Intubation Type: IV induction and Cricoid Pressure applied Ventilation: Oral airway inserted - appropriate to patient size and Mask ventilation with difficulty Laryngoscope Size: Mac and 4 Grade View: Grade II Tube type: Oral Tube size: 7.0 mm Number of attempts: 3 (Inserted by SRNA) Airway Equipment and Method: Stylet Placement Confirmation: ETT inserted through vocal cords under direct vision,  positive ETCO2,  CO2 detector and breath sounds checked- equal and bilateral Secured at: 21 cm Tube secured with: Tape Dental Injury: Teeth and Oropharynx as per pre-operative assessment  Difficulty Due To: Difficulty was anticipated, Difficult Airway- due to anterior larynx and Difficult Airway- due to dentition Comments: Smooth IV induction--easy mask with OPA. DL x 1 with MAC4, unable to gain view 2* anterior and dentition; DL with MIL 3, G3 view--unable to pass ETT thru VC; Pts head repositioned and bed in RevT and successful intubation with MAC4.  VSS, spO2 99-100%.

## 2016-06-26 NOTE — Progress Notes (Signed)
Dr. Malen GauzeFoster called and made aware of pt's elevated BP.  No further actions needed currently, will have A-line placed prior to surgery.

## 2016-06-26 NOTE — Op Note (Signed)
OPERATIVE NOTE  PROCEDURE:   1.  right carotid endarterectomy with bovine patch angioplasty 2.  right intraoperative carotid ultrasound  PRE-OPERATIVE DIAGNOSIS: right asymptomatic carotid stenosis >80%  POST-OPERATIVE DIAGNOSIS: same as above   SURGEON: Leonides SakeBrian Chen, MD  ASSISTANT(S): Karsten RoKim Trinh, PAC   ANESTHESIA: general  ESTIMATED BLOOD LOSS: 200 cc  FINDING(S): 1.  Continuous Doppler audible flow signatures are appropriate for each carotid artery. 2.  No evidence of intimal flap visualized on transverse or longitudinal ultrasonography. 3.  Carotid plaque: non-necrotic core, near occluded, mildly calcified 4.  Vagus nerve: normal position 5.  Hypoglossal nerve: visualized, did not require mobilization  SPECIMEN(S):  None   INDICATIONS:   Adam Mcclain is a 65 y.o. male who presents with right asymptomatic carotid stenosis >80%.  I discussed with the patient the risks, benefits, and alternatives to carotid endarterectomy.  I discussed the procedural details of carotid endarterectomy with the patient.  The patient is aware that the risks of carotid endarterectomy include but are not limited to: bleeding, infection, stroke, myocardial infarction, death, cranial nerve injuries both temporary and permanent, neck hematoma, possible airway compromise, labile blood pressure post-operatively, cerebral hyperperfusion syndrome, and possible need for additional interventions in the future. The patient is aware of the risks and agrees to proceed forward with the procedure.   DESCRIPTION: After full informed written consent was obtained from the patient, the patient was brought back to the operating room and placed supine upon the operating table.  Prior to induction, the patient received IV antibiotics.  After obtaining adequate anesthesia, the patient was placed into semi-Fowler position with a shoulder roll in place and the patient's neck slightly hyperextended and rotated away from the  surgical site.    The patient was prepped in the standard fashion for a right carotid endarterectomy.  I made an incision anterior to the sternocleidomastoid muscle and dissected down through the subcutaneous tissue.  The platysmas was opened with electrocautery.  Then I dissected down to the internal jugular vein.  This was dissected posteriorly until I obtained visualization of the common carotid artery.  This was dissected out and then an umbilical tape was placed around the common carotid artery and I loosely applied a Rumel tourniquet.  I then dissected in a periadventitial fashion along the common carotid artery up to the bifurcation.  I then identified the external carotid artery and the superior thyroid artery.  A 2-0 silk tie was looped around the superior thyroid artery, and I also dissected out the external carotid artery and placed a vessel loop around it.  In continuing the dissection to the internal carotid artery, I identified the facial vein.  This was ligated and then transected, giving me improved exposure of the internal carotid artery.  In the process of this dissection, the hypoglossal nerve was identified.  I then dissected out the internal carotid artery until I identified an area of soft tissue in the internal carotid artery.  I dissected slightly distal to this area, and placed an umbilical tape around the artery and loosely applied a Rumel tourniquet.    At this point, we gave the patient a therapeutic bolus of Heparin intravenously, 6500 units (roughly 100 units/kg).  In total, 8500 units of Heparin was given, with an additional 2000 units given at one hour.  After waiting 3 minutes, then I clamped the internal carotid artery, external carotid artery and then the common carotid artery.  I then made an arteriotomy in the common  carotid artery with a 11 blade, and extended the arteriotomy with a Potts scissor down into the common carotid artery, then I carried the arteriotomy through the  bifurcation into the internal carotid artery until I reached an area that was not diseased.  At this point, I took the 10 shunt that previously been prepared and I inserted it into the internal carotid artery.  The Rumel tourniquet was then applied to this end of the shunt.  I unclamped the shunt to verify retrograde blood flow in the internal carotid artery.  I then placed the other end of the shunt into the common carotid artery after unclamping the artery.  The Rumel was tightened down around the shunt.  At this point, I verified blood flow in the shunt with a continuous doppler.  At this point, I started the endarterectomy in the common carotid artery with a Cytogeneticist and carried this dissection down into the common carotid artery circumferentially.  Then I transected the plaque at a segment where it was adherent.  I then carried this dissection up into the external carotid artery.  The plaque was extracted by unclamping the external carotid artery and everting the artery.  The dissection was then carried into the internal carotid artery, extracting the remaining portion of the carotid plaque.  I passed the plaque off the field as a specimen.    I then spent the next 30 minutes removing intimal flaps and loose debris.  Eventually I reached the point where the residual plaque was densely adherent and any further dissection would compromise the integrity of the wall.  After verifying that there was no more loose intimal flaps or debris, I re-interrogated the entirety of this carotid artery.  At this point, I was satisfied that the minimal remaining disease was densely adherent to the wall and wall integrity was intact.  At this point, I then fashioned a bovine pericardial patch for the geometry of this artery.  After sewing in the distal half of the bovine patch with a 6-0 Prolene, it became evident that the patch was not quite long enough.  However, only 2-3 cm of the common carotid artery needed to be  addresses, so I elected to primarily repair the common carotid artery with a 6-0 Prolene up to the proximal extent of the patch.  I sewed the proximal end of the patch in place with a running stitch of 6-0 Prolene.  This stitch was tied to the stitch in the proximal common carotid artery and also tied to the stitches from the distal half of the patch.  Prior to completing this patch angioplasty, I removed the shunt first from the internal carotid artery, from which there was continuous limited backbleeding, and clamped it.  Then I removed the shunt from the common carotid artery, from which there was excellent antegrade bleeding, and then clamped it.  At this point, I allowed the external carotid artery to backbleed, which was excellent.  Then I instilled heparinized saline in this patched artery and then completed the patch angioplasty in the usual fashion.    At this point, I first released the clamp on the external carotid artery, then I released it on the common carotid artery.  After waiting a few seconds, I then released it on the internal carotid artery.  I then interrogated this patient's arteries with the continuous Doppler.  The audible waveforms in each artery were consistent with the expected characteristics for each artery.  The Sonosite probe was then  sterilely draped and used to interrogate the carotid artery in both longitudinal and transverse views.  At this point, I washed out the wound, and placed Avitene throughout.  I also gave the patient 50 mg of protamine to reverse his anticoagulation.   After waiting a few minutes, I removed the Avitene and washed out the wound.  There continued to be diffuse bleeding, including a portion of the strap muscle directly adjacent to the vagus nerve.  I packed the entire surgical incision with Avitene.  After waiting a few minutes, there continued to be some bleeding at the bleeding point adjacent to the vagus nerve, so I felt placement of a drain was  necessary.  I passed the TLS trocar through the subcutaneous tissue inferior to the incision line.  I pulled the drain to appropriate position and secured it with a 3-0 Nylon suture tied to the drain.  I shortened the drain to appropriate length and place it adjacent to the artery and nerve.  The exterior hardware was attached to the drain and the test tube was not attached until the skin was close.    I then reapproximated the platysma muscle with a running stitch of 3-0 Vicryl.  The skin was then reapproximated with a running subcuticular 4-0 Monocryl stitch.  The skin was then cleaned, dried and LiquidBand was used to reinforce the skin closure.    The patient woke without any problems, neurologically intact.      COMPLICATIONS: none  CONDITION: stable   Leonides Sake, MD, [facility] Office: [phone] Pager: [phone]  06/26/2016, 12:04 PM

## 2016-06-26 NOTE — Interval H&P Note (Signed)
History and Physical Interval Note:  06/26/2016 8:21 AM  Adam Mcclain  has presented today for surgery, with the diagnosis of Right carotid artery stenosis I65.21  The various methods of treatment have been discussed with the patient and family. After consideration of risks, benefits and other options for treatment, the patient has consented to  Procedure(s): ENDARTERECTOMY CAROTID (Right) as a surgical intervention .  The patient's history has been reviewed, patient examined, no change in status, stable for surgery.  I have reviewed the patient's chart and labs.  Questions were answered to the patient's satisfaction.     Leonides SakeBrian Kanin Lia

## 2016-06-26 NOTE — H&P (View-Only) (Signed)
Established Carotid Patient  History of Present Illness  Adam Mcclain is a 65 y.o. (27-May-1951) male who presents with chief complaint: follow up from CTA Neck.  The patient has had no CVA or TIA sx since last visit.  Previous carotid studies demonstrated: RICA 80-99% stenosis, LICA 40-59% stenosis.  Patient has no history of TIA or stroke symptom.  The patient has never had amaurosis fugax or monocular blindness.  The patient has never had facial drooping or hemiplegia.  The patient has never had receptive or expressive aphasia.  The patient was sent to CTA Neck to evaluate his anatomy and degree of stenosis.  Pt has been seen by Cardiology and has a low risk nuclear study.  Past Medical History:  Diagnosis Date  . Atrial fibrillation (HCC)   . Chronic back pain   . Hypertension   . Stroke [facility](HCC)     Past Surgical History:  Procedure Laterality Date  . ABDOMINAL AORTAGRAM  03-25-13   with Stent  . ABDOMINAL AORTAGRAM N/A 03/25/2013   Procedure: ABDOMINAL Ronny FlurryAORTAGRAM;  Surgeon: Fransisco HertzBrian L Chen, MD;  [facility];  Service: Cardiovascular;  Laterality: N/A;  . CATARACT EXTRACTION Bilateral   . CHOLECYSTECTOMY    . EYE SURGERY    . KIDNEY STONE SURGERY    . LOWER EXTREMITY ANGIOGRAM N/A 03/25/2013   Procedure: LOWER EXTREMITY ANGIOGRAM;  Surgeon: Fransisco HertzBrian L Chen, MD;  [facility];  Service: Cardiovascular;  Laterality: N/A;  . PERCUTANEOUS STENT INTERVENTION Right 03/25/2013   Procedure: PERCUTANEOUS STENT INTERVENTION;  Surgeon: Fransisco HertzBrian L Chen, MD;  [facility];  Service: Cardiovascular;  Laterality: Right;  icast stent to rt common iliac artery  . YAG LASER APPLICATION Left 02/20/2015   Procedure: YAG LASER APPLICATION;  Surgeon: Susa Simmondsarroll F Haines, MD;  Location: [facility] ORS;  Service: Ophthalmology;  Laterality: Left;    Social History   Social History  . Marital status: Widowed    Spouse name: N/A  . Number of children: N/A  . Years of education: N/A    Occupational History  . Not on file.   Social History Main Topics  . Smoking status: Current Every Day Smoker    Types: Cigars  . Smokeless tobacco: Never Used     Comment: 10 Cigars per day  . Alcohol use No  . Drug use: No  . Sexual activity: Not on file   Other Topics Concern  . Not on file   Social History Narrative  . No narrative on file    Family History  Problem Relation Age of Onset  . Cancer Father   . Diabetes Father   . Hypertension Father     Current Outpatient Prescriptions  Medication Sig Dispense Refill  . ALPRAZolam (XANAX) 0.5 MG tablet Take 0.5 mg by mouth daily.    Marland Kitchen. aspirin 81 MG tablet Take 81 mg by mouth daily.    Marland Kitchen. atorvastatin (LIPITOR) 40 MG tablet Take 40 mg by mouth daily.     Marland Kitchen. buPROPion (WELLBUTRIN SR) 150 MG 12 hr tablet Take 150 mg by mouth 2 (two) times daily.    . citalopram (CELEXA) 20 MG tablet Take 1 tablet by mouth daily.    . clopidogrel (PLAVIX) 75 MG tablet Take 1 tablet (75 mg total) by mouth daily. 30 tablet 11  . etodolac (LODINE) 400 MG tablet Take 400 mg by mouth 2 (two) times daily.    Marland Kitchen. lisinopril-hydrochlorothiazide (PRINZIDE,ZESTORETIC) 20-12.5 MG per tablet Take  2 tablets by mouth daily.     Marland Kitchen. lovastatin (MEVACOR) 20 MG tablet Take 20 mg by mouth at bedtime.    . Multiple Vitamin (MULTIVITAMIN WITH MINERALS) TABS Take 1 tablet by mouth daily.    . traMADol (ULTRAM) 50 MG tablet Take 1 tablet by mouth 3 (three) times daily as needed.    . traZODone (DESYREL) 50 MG tablet Take 50 mg by mouth at bedtime.     No current facility-administered medications for this visit.      No Known Allergies   REVIEW OF SYSTEMS:  (Positives checked otherwise negative)  CARDIOVASCULAR:   [ ]  chest pain,  [ ]  chest pressure,  [ ]  palpitations,  [ ]  shortness of breath when laying flat,  [ ]  shortness of breath with exertion,   [ ]  pain in feet when walking,  [ ]  pain in feet when laying flat, [ ]  history of blood clot in veins  (DVT),  [ ]  history of phlebitis,  [ ]  swelling in legs,  [ ]  varicose veins  PULMONARY:   [ ]  productive cough,  [ ]  asthma,  [ ]  wheezing  NEUROLOGIC:   [ ]  weakness in arms or legs,  [ ]  numbness in arms or legs,  [ ]  difficulty speaking or slurred speech,  [ ]  temporary loss of vision in one eye,  [ ]  dizziness  HEMATOLOGIC:   [ ]  bleeding problems,  [ ]  problems with blood clotting too easily  MUSCULOSKEL:   [ ]  joint pain, [ ]  joint swelling  GASTROINTEST:   [ ]  vomiting blood,  [ ]  blood in stool     GENITOURINARY:   [ ]  burning with urination,  [ ]  blood in urine  PSYCHIATRIC:   [ ]  history of major depression  INTEGUMENTARY:   [ ]  rashes,  [ ]  ulcers  CONSTITUTIONAL:   [ ]  fever,  [ ]  chills    Physical Examination  There were no vitals filed for this visit.   There is no height or weight on file to calculate BMI.  General: A&O x 3, WDWN  Eyes: PERRLA, EOMI  Neck: Supple, no nuchal rigidity, no palpable LAD  Pulmonary: Sym exp, good air movt, CTAB, no rales, rhonchi, & wheezing  Cardiac: RRR, Nl S1, S2, no Murmurs, rubs or gallops  Vascular: Vessel Right Left  Radial Palpable Faintly Palpable  Brachial Palpable Palpable  Carotid Palpable, without bruit Palpable, without bruit  Aorta Not palpable N/A  Femoral Palpable Palpable  Popliteal Not palpable Not palpable  PT Not Palpable Not Palpable  DP Not Palpable Not Palpable   Gastrointestinal: soft, NTND, no G/R, no HSM, no masses, no CVAT B  Musculoskeletal: M/S 5/5 throughout , Extremities without ischemic changes   Neurologic: CN 2-12 intact , Pain and light touch intact in extremities , Motor exam as listed above  CTA Neck (05/27/16) Diffuse atherosclerotic vascular disease.  Severe stenosis in the right ICA bulb with minimal diameter of 1.5 mm at the distal bulb. Diameter stenosis measures 70%. Area stenosis would be greater than that.  50% stenosis of the left  ICA in the distal bulb.  Bilateral subclavian artery stenoses, 70% on the right and 50% on the left.  Right vertebral artery supplies only PICA. This vessel is diffusely disease to, narrowed by approximately 50% throughout the proximal 4 cm of the vessel.  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  Reviewing the CTA  Neck, R ICA stenosis appears to >80% and surgically accessible.  On the reconstructions, a 3-4 cm segment of diffuse calcification is evident involving this segment.  The L ICA stenosis appears to be <50%.     Medical Decision Making  MAYRA BRAHM is a 65 y.o. male who presents with: asx R ICA stenosis >80%.   Given the diffuse calcification in the R ICA, the PSV likely is lower than the actual in-vessel velocity, thus the actually stenosis in the lumen is likely significantly >80%.  Based on the patient's vascular studies and examination, I have offered the patient: R CEA. I discussed with the patient the risks, benefits, and alternatives to carotid endarterectomy.   The patient is aware that the risks of carotid endarterectomy include but are not limited to: bleeding, infection, stroke, myocardial infarction, death, cranial nerve injuries both temporary and permanent, neck hematoma, possible airway compromise, labile blood pressure post-operatively, cerebral hyperperfusion syndrome, and possible need for additional interventions in the future.  The patient is aware of the risks and agrees to proceed forward with the procedure.  He is scheduled for 11 OCT 17.  I discussed in depth with the patient the nature of atherosclerosis, and emphasized the importance of maximal medical management including strict control of blood pressure, blood glucose, and lipid levels, antiplatelet agents, obtaining regular exercise, and cessation of smoking.    The patient is aware that without maximal medical management the underlying atherosclerotic disease  process will progress, limiting the benefit of any interventions. The patient is currently on a statin: Mevacor. The patient is currently on an anti-platelet: ASA.  Thank you for allowing [place] to participate in this patient's care.   Leonides Sake, MD, FACS [facility] [place] Office: [phone] Pager: [phone]

## 2016-06-27 ENCOUNTER — Telehealth: Payer: Self-pay | Admitting: Vascular Surgery

## 2016-06-27 ENCOUNTER — Encounter (HOSPITAL_COMMUNITY): Payer: Self-pay | Admitting: Vascular Surgery

## 2016-06-27 DIAGNOSIS — I739 Peripheral vascular disease, unspecified: Secondary | ICD-10-CM | POA: Diagnosis present

## 2016-06-27 DIAGNOSIS — M549 Dorsalgia, unspecified: Secondary | ICD-10-CM | POA: Diagnosis present

## 2016-06-27 DIAGNOSIS — Z8673 Personal history of transient ischemic attack (TIA), and cerebral infarction without residual deficits: Secondary | ICD-10-CM | POA: Diagnosis not present

## 2016-06-27 DIAGNOSIS — Z7982 Long term (current) use of aspirin: Secondary | ICD-10-CM | POA: Diagnosis not present

## 2016-06-27 DIAGNOSIS — Z7902 Long term (current) use of antithrombotics/antiplatelets: Secondary | ICD-10-CM | POA: Diagnosis not present

## 2016-06-27 DIAGNOSIS — F17290 Nicotine dependence, other tobacco product, uncomplicated: Secondary | ICD-10-CM | POA: Diagnosis present

## 2016-06-27 DIAGNOSIS — I6521 Occlusion and stenosis of right carotid artery: Secondary | ICD-10-CM | POA: Diagnosis present

## 2016-06-27 DIAGNOSIS — K219 Gastro-esophageal reflux disease without esophagitis: Secondary | ICD-10-CM | POA: Diagnosis present

## 2016-06-27 DIAGNOSIS — Z79899 Other long term (current) drug therapy: Secondary | ICD-10-CM | POA: Diagnosis not present

## 2016-06-27 DIAGNOSIS — I4891 Unspecified atrial fibrillation: Secondary | ICD-10-CM | POA: Diagnosis present

## 2016-06-27 DIAGNOSIS — Z87442 Personal history of urinary calculi: Secondary | ICD-10-CM | POA: Diagnosis not present

## 2016-06-27 DIAGNOSIS — G8929 Other chronic pain: Secondary | ICD-10-CM | POA: Diagnosis present

## 2016-06-27 DIAGNOSIS — D62 Acute posthemorrhagic anemia: Secondary | ICD-10-CM | POA: Diagnosis not present

## 2016-06-27 DIAGNOSIS — Z833 Family history of diabetes mellitus: Secondary | ICD-10-CM | POA: Diagnosis not present

## 2016-06-27 DIAGNOSIS — Z9114 Patient's other noncompliance with medication regimen: Secondary | ICD-10-CM | POA: Diagnosis not present

## 2016-06-27 DIAGNOSIS — Z8249 Family history of ischemic heart disease and other diseases of the circulatory system: Secondary | ICD-10-CM | POA: Diagnosis not present

## 2016-06-27 DIAGNOSIS — I10 Essential (primary) hypertension: Secondary | ICD-10-CM | POA: Diagnosis present

## 2016-06-27 LAB — CBC
HCT: 37.5 % — ABNORMAL LOW (ref 39.0–52.0)
HEMOGLOBIN: 12.9 g/dL — AB (ref 13.0–17.0)
MCH: 32.1 pg (ref 26.0–34.0)
MCHC: 34.4 g/dL (ref 30.0–36.0)
MCV: 93.3 fL (ref 78.0–100.0)
Platelets: 113 10*3/uL — ABNORMAL LOW (ref 150–400)
RBC: 4.02 MIL/uL — ABNORMAL LOW (ref 4.22–5.81)
RDW: 13.3 % (ref 11.5–15.5)
WBC: 11.5 10*3/uL — ABNORMAL HIGH (ref 4.0–10.5)

## 2016-06-27 LAB — BASIC METABOLIC PANEL
Anion gap: 8 (ref 5–15)
BUN: 13 mg/dL (ref 6–20)
CHLORIDE: 104 mmol/L (ref 101–111)
CO2: 24 mmol/L (ref 22–32)
CREATININE: 1.38 mg/dL — AB (ref 0.61–1.24)
Calcium: 8.5 mg/dL — ABNORMAL LOW (ref 8.9–10.3)
GFR calc Af Amer: 60 mL/min — ABNORMAL LOW (ref >60)
GFR calc non Af Amer: 52 mL/min — ABNORMAL LOW (ref >60)
Glucose, Bld: 134 mg/dL — ABNORMAL HIGH (ref 65–99)
Potassium: 4.2 mmol/L (ref 3.5–5.1)
Sodium: 136 mmol/L (ref 135–145)

## 2016-06-27 MED ORDER — TRAMADOL HCL 50 MG PO TABS: 50.0000 mg | ORAL_TABLET | Freq: Three times a day (TID) | ORAL | 0 refills | Status: DC | PRN

## 2016-06-27 NOTE — Telephone Encounter (Signed)
Spoke to pt on home # for appt on 07/17/16

## 2016-06-27 NOTE — Progress Notes (Addendum)
  [facility] Progress Note  Subjective  - POD #1  No complaints.   Objective Vitals:   06/26/16 2345 06/27/16 0317  BP: 111/70 120/67  Pulse: (!) 51 (!) 48  Resp: (!) 21   Temp: 97.8 F (36.6 C) 97.6 F (36.4 C)    Intake/Output Summary (Last 24 hours) at 06/27/16 16100722 Last data filed at 06/27/16 0600  Gross per 24 hour  Intake          2817.75 ml  Output              910 ml  Net          1907.75 ml   Right neck without hematoma Tongue midline. Smile symmetric. Equal strength upper and lower extremities.   Assessment/Planning: 65 y.o. male is s/p: right carotid endarterectomy 1 Day Post-Op   Neuro exam intact.  ABLA tolerating.  10 cc output from TLS drain. D/c drain.  BP has been stable this am. Patient noncompliant with medications and was hypertensive yesterday in the 200s. Discussed importance of taking BP meds.  Has voided.  Tolerating liquids. Patient with PAD and claudication. Will have PT evaluate this am. Patient is worried about going home.  Plan d/c home today after breakfast and after ambulating with PT.   Raymond GurneyKimberly A Trinh 06/27/2016 7:22 AM --  Laboratory CBC    Component Value Date/Time   WBC 11.5 (H) 06/27/2016 0320   HGB 12.9 (L) 06/27/2016 0320   HCT 37.5 (L) 06/27/2016 0320   PLT 113 (L) 06/27/2016 0320    BMET    Component Value Date/Time   NA 136 06/27/2016 0320   K 4.2 06/27/2016 0320   CL 104 06/27/2016 0320   CO2 24 06/27/2016 0320   GLUCOSE 134 (H) 06/27/2016 0320   BUN 13 06/27/2016 0320   CREATININE 1.38 (H) 06/27/2016 0320   CALCIUM 8.5 (L) 06/27/2016 0320   GFRNONAA 52 (L) 06/27/2016 0320   GFRAA 60 (L) 06/27/2016 0320    COAG Lab Results  Component Value Date   INR 1.08 06/24/2016   No results found for: PTT  Antibiotics Anti-infectives    Start     Dose/Rate Route Frequency Ordered Stop   06/26/16 2100  cefUROXime (ZINACEF) 1.5 g in dextrose 5 % 50 mL IVPB     1.5 g 100 mL/hr over 30  Minutes Intravenous Every 12 hours 06/26/16 1805 06/27/16 2059   06/26/16 0602  cefUROXime (ZINACEF) 1.5 g in dextrose 5 % 50 mL IVPB     1.5 g 100 mL/hr over 30 Minutes Intravenous 30 min pre-op 06/26/16 0602 06/26/16 0858       Maris BergerKimberly Trinh, PA-C [facility] Office: [phone] Pager: [phone] 06/27/2016 7:22 AM   Addendum  I have independently interviewed and examined the patient, and I agree with the physician assistant's findings.  Neuro intact.  No neck hematoma.  Swallow ok.  Ambulate with PT and then discharge if no needs.  I have repeated emphasize the need to maintain compliance with the anti-HTN regimen.  The patient is aware he is a risk for CVA if he continues to be non-compliant.  I have emphasized this with his family members yesterday also.  Leonides SakeBrian Lalania Haseman, MD, FACS [facility] [place] Office: [phone] Pager: [phone]  06/27/2016, 9:54 AM

## 2016-06-27 NOTE — Progress Notes (Signed)
Patient discharged per MD order to home with daughter.

## 2016-06-27 NOTE — Telephone Encounter (Signed)
-----   Message from Phillips Odorarol S Pullins, RN sent at 06/26/2016  4:27 PM EDT ----- Regarding: needs 2 week f/u with Dr. Imogene Burnhen   ----- Message ----- From: Fransisco HertzBrian L Chen, MD Sent: 06/26/2016  12:21 PM To: [facility] [address]  Santiago Gladerry F Esco [id number] 1951-06-17  PROCEDURE:   1.  right carotid endarterectomy with bovine patch angioplasty 2.  right intraoperative carotid ultrasound  As[facility] Karsten [facility]Kim Trinh, [facility]   Follow-up: 2 weeks

## 2016-06-27 NOTE — Care Management Note (Signed)
Case Management Note  Patient Details  Name: Santiago Gladerry F Gambill MRN: [id number] Date of Birth: [birth year]  Subjective/Objective:   S/p CEA, indep at home uses a cane, his daughter is here to pick patient up, daughter states that she has someone that will be staying with patient at discharge for a week or two.  He has PCP and he has medication coverage and transportation. Patient for dc , no needs.                 Action/Plan:   Expected Discharge Date:                  Expected Discharge Plan:  Home/Self Care  In-House Referral:     Discharge planning Services  CM Consult  Post Acute Care Choice:    Choice offered to:     DME Arranged:    DME Agency:     HH Arranged:    HH Agency:     Status of Service:  Completed, signed off  If discussed at [employer] Length of Stay Meetings, dates discussed:    Additional Comments:  Leone Havenaylor, Quin Mcpherson Clinton, RN 06/27/2016, 11:44 AM

## 2016-06-27 NOTE — Anesthesia Postprocedure Evaluation (Signed)
Anesthesia Post Note  Patient: Adam Mcclain  Procedure(s) Performed: Procedure(s) (LRB): ENDARTERECTOMY CAROTID RIGHT (Right) PATCH ANGIOPLASTY RIGHT CAROTID ARTERY (Right)  Patient location during evaluation: PACU Anesthesia Type: General Level of consciousness: awake and alert Pain management: pain level controlled Vital Signs Assessment: post-procedure vital signs reviewed and stable Respiratory status: spontaneous breathing, nonlabored ventilation, respiratory function stable and patient connected to nasal cannula oxygen Cardiovascular status: blood pressure returned to baseline and stable Postop Assessment: no signs of nausea or vomiting Anesthetic complications: no    Last Vitals:  Vitals:   06/27/16 0700 06/27/16 0725  BP: 136/74 136/74  Pulse:    Resp: 18 15  Temp: 36.5 C     Last Pain:  Vitals:   06/27/16 0725  TempSrc:   PainSc: 0-No pain                 Kennieth RadFitzgerald, Trigger Frasier E

## 2016-06-28 ENCOUNTER — Telehealth: Payer: Self-pay | Admitting: *Deleted

## 2016-06-28 ENCOUNTER — Ambulatory Visit: Payer: Commercial Managed Care - HMO | Admitting: Internal Medicine

## 2016-06-28 NOTE — Telephone Encounter (Signed)
Patient's caregiver Misty StanleyLisa called asking how to cleanse incision.  She states that a dressing is placed on the lower part of the incision and asked if she was to remove this.  I instructed patient to gently cleanse with dial soap, waiting a day or 2 to remove any bandage.  Misty StanleyLisa voiced an understanding to the instructions.

## 2016-07-01 NOTE — Discharge Summary (Signed)
[facility] Discharge Summary  Adam Mcclain 01-Aug-1951 65 y.o. male  [id number]  Admission Date: 06/26/2016  Discharge Date: 06/27/2016  Physician: Leonides SakeBrian Mate Alegria, MD  Admission Diagnosis: Right carotid artery stenosis I65.21  HPI:   This is a [age] y.o. male who presented with chief complaint: follow up from CTA Neck.  The patient has had no CVA or TIA sx since last visit.  Previous carotid studies demonstrated: RICA 80-99% stenosis, LICA 40-59% stenosis.  Patient has no history of TIA or stroke symptom.  The patient has never had amaurosis fugax or monocular blindness.  The patient has never had facial drooping or hemiplegia.  The patient has never had receptive or expressive aphasia.  The patient was sent to CTA Neck to evaluate his anatomy and degree of stenosis.  Pt has been seen by Cardiology and has a low risk nuclear study.  Hospital Course:  The patient was admitted to the hospital and taken to the operating room on 06/26/2016 and underwent right carotid endarterectomy.  The patient tolerated the procedure well and was transported to the PACU in stable condition.  By POD 1, the patient's neuro status was intact. His neck was without hematoma. His TLS drain was discontinued due to low output. His blood pressure was stable. His blood pressure was elevated pre-op and intra-op due to noncompliance with blood pressure medications. He was advised to comply with his antihypertensive regimen. He was ambulating, tolerating a diet and voiding without difficulty. He was discharged home on POD 1 in good condition.   Discharge Instructions:   The patient is discharged to home with extensive instructions on wound care and progressive ambulation.  They are instructed not to drive or perform any heavy lifting until returning to see the physician in his office.  Discharge Instructions    CAROTID Sugery: Call MD for difficulty swallowing or speaking; weakness in arms or legs  that is a new symtom; severe headache.  If you have increased swelling in the neck and/or  are having difficulty breathing, CALL [phone]    Complete by:  As directed    Call MD for:  redness, tenderness, or signs of infection (pain, swelling, bleeding, redness, odor or green/yellow discharge around incision site)    Complete by:  As directed    Call MD for:  severe or increased pain, loss or decreased feeling  in affected limb(s)    Complete by:  As directed    Call MD for:  temperature >100.5    Complete by:  As directed    Discharge wound care:    Complete by:  As directed    Wash wound daily with soap and water and pat dry. Do not apply any creams or ointments on your incisions.   Driving Restrictions    Complete by:  As directed    No driving for 1 week   Increase activity slowly    Complete by:  As directed    Walk with assistance use walker or cane as needed   Lifting restrictions    Complete by:  As directed    No lifting for 2 weeks   Resume previous diet    Complete by:  As directed       Discharge Diagnosis:  Right carotid artery stenosis I65.21  Secondary Diagnosis: Patient Active Problem List   Diagnosis Date Noted  . Asymptomatic carotid artery stenosis, right 06/27/2016  . Bilateral carotid artery disease (HCC) 06/26/2016  . Carotid artery disease (HCC) 05/10/2016  .  Cold hands 04/23/2013  . Peripheral vascular disease, unspecified 03/12/2013  . Atherosclerosis of native arteries of extremity with intermittent claudication (HCC) 03/12/2013  . Lumbago 03/12/2013   Past Medical History:  Diagnosis Date  . Arthritis   . Atrial fibrillation (HCC)   . Chronic back pain   . Dysrhythmia    A-Fib  . GERD (gastroesophageal reflux disease)   . History of kidney stones   . Hypertension   . Stroke [facility])       Medication List    TAKE these medications   ALPRAZolam 0.5 MG tablet Commonly known as:  XANAX Take 0.5 mg by mouth daily.   aspirin 81 MG tablet Take  81 mg by mouth daily.   atorvastatin 40 MG tablet Commonly known as:  LIPITOR Take 40 mg by mouth daily.   buPROPion 150 MG 12 hr tablet Commonly known as:  WELLBUTRIN SR Take 150 mg by mouth 2 (two) times daily.   citalopram 20 MG tablet Commonly known as:  CELEXA Take 20 mg by mouth daily.   clopidogrel 75 MG tablet Commonly known as:  PLAVIX Take 1 tablet (75 mg total) by mouth daily.   etodolac 400 MG tablet Commonly known as:  LODINE Take 400 mg by mouth 2 (two) times daily.   lisinopril-hydrochlorothiazide 20-12.5 MG tablet Commonly known as:  PRINZIDE,ZESTORETIC Take 1 tablet by mouth daily.   lovastatin 20 MG tablet Commonly known as:  MEVACOR Take 20 mg by mouth at bedtime.   multivitamin with minerals Tabs tablet Take 1 tablet by mouth daily.   traMADol 50 MG tablet Commonly known as:  ULTRAM Take 1 tablet (50 mg total) by mouth 3 (three) times daily as needed for moderate pain.   traZODone 50 MG tablet Commonly known as:  DESYREL Take 50 mg by mouth at bedtime.       Tramadol #8 No Refill  Disposition: Home  Patient's condition: is Good  Follow up: 1. Dr.  Imogene Burn in 2 weeks.   Maris Berger, PA-C [facility] [phone]  Addendum  I agree with the physician assistant's discharge summary.  This patient had a R CEA done for a near occlusion of his R ICA.  His operation was uneventful as was his post-operative course.  He was discharge on POD #1 with no neurologic deficit, no hematoma, and swallowing without any difficulties.  Leonides Sake, MD, FACS [facility] [place] Office: [phone] Pager: [phone]  07/01/2016, 12:44 PM    --- For VQI Registry use --- Instructions: Press F2 to tab through selections.  Delete question if not applicable.   Modified Rankin score at D/C (0-6): 0  IV medication needed for:  1. Hypertension: No 2. Hypotension: No  Post-op Complications: No  1.  Post-op CVA or TIA: No  2. CN injury: No  3. Myocardial infarction: No  4.  CHF: No  5.  Dysrhythmia (new): No  6. Wound infection: No  7. Reperfusion symptoms: No  8. Return to OR: No  Discharge medications: Statin use:  Yes If No: [ ]  For Medical reasons, [ ]  Non-compliant, [ ]  Not-indicated ASA use:  Yes  If No: [ ]  For Medical reasons, [ ]  Non-compliant, [ ]  Not-indicated Beta blocker use:  No If No: [ ]  For Medical reasons, [ ]  Non-compliant, [x ] Not-indicated ACE-Inhibitor use:  Yes If No: [ ]  For Medical reasons, [ ]  Non-compliant, [ ]  Not-indicated P2Y12 Antagonist use: Yes, [x ] Plavix, [ ]   Plasugrel, [ ]  Ticlopinine, [ ]  Ticagrelor, [ ]  Other, [ ]  No for medical reason, [ ]  Non-compliant, [ ]  Not-indicated Anti-coagulant use:  No, [ ]  Warfarin, [ ]  Rivaroxaban, [ ]  Dabigatran, [ ]  Other, [ ]  No for medical reason, [ ]  Non-compliant, [x ] Not-indicated

## 2016-07-11 ENCOUNTER — Encounter: Payer: Self-pay | Admitting: Vascular Surgery

## 2016-07-16 NOTE — Progress Notes (Signed)
Postoperative Visit   History of Present Illness  Adam Mcclain is a 65 y.o. male who presents for postoperative follow-up for: R CEA (Date: 06/26/16).   R ICA plaque was near occluding and mildly calcified.  The patient's neck incision is healing.  The patient has had no stroke or TIA symptoms.  The patient also returns for discussion of reintervention on his R CIA stent which had velocities 315-364 c/s (x5.6 ratio).  The patient continues to have some intermittent claudication without rest pain.   Past Medical History:  Diagnosis Date  . Arthritis   . Atrial fibrillation (HCC)   . Chronic back pain   . Dysrhythmia    A-Fib  . GERD (gastroesophageal reflux disease)   . History of kidney stones   . Hypertension   . Stroke [facility])     Past Surgical History:  Procedure Laterality Date  . ABDOMINAL AORTAGRAM  03-25-13   with Stent  . ABDOMINAL AORTAGRAM N/A 03/25/2013   Procedure: ABDOMINAL Ronny Flurry;  Surgeon: Fransisco Hertz, MD;  Location: [facility] CATH LAB;  Service: Cardiovascular;  Laterality: N/A;  . CATARACT EXTRACTION Bilateral   . CHOLECYSTECTOMY    . ENDARTERECTOMY Right 06/26/2016   Procedure: ENDARTERECTOMY CAROTID RIGHT;  Surgeon: Fransisco Hertz, MD;  Location: [facility] OR;  Service: Vascular;  Laterality: Right;  . EYE SURGERY    . KIDNEY STONE SURGERY    . LOWER EXTREMITY ANGIOGRAM N/A 03/25/2013   Procedure: LOWER EXTREMITY ANGIOGRAM;  Surgeon: Fransisco Hertz, MD;  Location: [facility] CATH LAB;  Service: Cardiovascular;  Laterality: N/A;  . PATCH ANGIOPLASTY Right 06/26/2016   Procedure: PATCH ANGIOPLASTY RIGHT CAROTID ARTERY;  Surgeon: Fransisco Hertz, MD;  Location: [facility] OR;  Service: Vascular;  Laterality: Right;  . PERCUTANEOUS STENT INTERVENTION Right 03/25/2013   Procedure: PERCUTANEOUS STENT INTERVENTION;  Surgeon: Fransisco Hertz, MD;  Location: [facility] CATH LAB;  Service: Cardiovascular;  Laterality: Right;  icast stent to rt common iliac artery  . YAG LASER APPLICATION Left 02/20/2015   Procedure: YAG LASER APPLICATION;  Surgeon: Susa Simmonds, MD;  Location: [facility] ORS;  Service: Ophthalmology;  Laterality: Left;    Social History   Social History  . Marital status: Widowed    Spouse name: N/A  . Number of children: N/A  . Years of education: N/A   Occupational History  . Not on file.   Social History Main Topics  . Smoking status: Current Every Day Smoker    Types: Cigars  . Smokeless tobacco: Never Used     Comment: 7 cigars a day  . Alcohol use No  . Drug use: No  . Sexual activity: Not on file   Other Topics Concern  . Not on file   Social History Narrative  . No narrative on file    Family History  Problem Relation Age of Onset  . Cancer Father   . Diabetes Father   . Hypertension Father     Current Outpatient Prescriptions  Medication Sig Dispense Refill  . ALPRAZolam (XANAX) 0.5 MG tablet Take 0.5 mg by mouth daily.    Marland Kitchen aspirin 81 MG tablet Take 81 mg by mouth daily.    Marland Kitchen atorvastatin (LIPITOR) 40 MG tablet Take 40 mg by mouth daily.     Marland Kitchen buPROPion (WELLBUTRIN SR) 150 MG 12 hr tablet Take 150 mg by mouth 2 (two) times daily.    . citalopram (CELEXA) 20 MG tablet Take 20 mg by mouth daily.     Marland Kitchen  clopidogrel (PLAVIX) 75 MG tablet Take 1 tablet (75 mg total) by mouth daily. 30 tablet 11  . etodolac (LODINE) 400 MG tablet Take 400 mg by mouth 2 (two) times daily.    Marland Kitchen. lisinopril-hydrochlorothiazide (PRINZIDE,ZESTORETIC) 20-12.5 MG per tablet Take 1 tablet by mouth daily.     Marland Kitchen. lovastatin (MEVACOR) 20 MG tablet Take 20 mg by mouth at bedtime.    . Multiple Vitamin (MULTIVITAMIN WITH MINERALS) TABS Take 1 tablet by mouth daily.    . traMADol (ULTRAM) 50 MG tablet Take 1 tablet (50 mg total) by mouth 3 (three) times daily as needed for moderate pain. 8 tablet 0  . traZODone (DESYREL) 50 MG tablet Take 50 mg by mouth at bedtime.     No current facility-administered medications for this visit.      Allergies  Allergen Reactions  . No Known  Allergies      REVIEW OF SYSTEMS:  (Positives checked otherwise negative)  CARDIOVASCULAR:   [ ]  chest pain,  [ ]  chest pressure,  [ ]  palpitations,  [ ]  shortness of breath when laying flat,  [ ]  shortness of breath with exertion,   [x]  pain in feet when walking,  [ ]  pain in feet when laying flat, [ ]  history of blood clot in veins (DVT),  [ ]  history of phlebitis,  [ ]  swelling in legs,  [ ]  varicose veins  PULMONARY:   [ ]  productive cough,  [ ]  asthma,  [ ]  wheezing  NEUROLOGIC:   [ ]  weakness in arms or legs,  [ ]  numbness in arms or legs,  [ ]  difficulty speaking or slurred speech,  [ ]  temporary loss of vision in one eye,  [ ]  dizziness  HEMATOLOGIC:   [ ]  bleeding problems,  [ ]  problems with blood clotting too easily  MUSCULOSKEL:   [ ]  joint pain, [ ]  joint swelling  GASTROINTEST:   [ ]  vomiting blood,  [ ]  blood in stool     GENITOURINARY:   [ ]  burning with urination,  [ ]  blood in urine  PSYCHIATRIC:   [ ]  history of major depression  INTEGUMENTARY:   [ ]  rashes,  [ ]  ulcers  CONSTITUTIONAL:   [ ]  fever,  [ ]  chills   Physical Examination  Vitals:   07/17/16 0915 07/17/16 0917  BP: 123/72 (!) 152/81  Pulse: 97 72  Resp: 16   Temp: 97 F (36.1 C)    Pulmonary: Sym exp, good air movt, CTAB, no rales, rhonchi, & wheezing  Cardiac: RRR, Nl S1, S2, no Murmurs, rubs or gallops  R Neck: Incision is healing, dermabond still in place  Neuro: CN 2-12 are intact , Motor strength is 5/5 bilaterally, sensation is grossly intact   Medical Decision Making  Adam Mcclain is a 65 y.o. 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 I discussed in depth with the patient the nature of atherosclerosis, and emphasized the importance of maximal medical management including strict control of blood pressure, blood glucose, and lipid levels, obtaining regular  exercise, anti-platelet use and cessation of smoking.   The patient is currently on an antiplatelet: ASA. The patient is currently on a statin: Lipitor.   The patient is aware that without maximal medical management the underlying atherosclerotic disease process will progress, limiting the benefit of any interventions. The patient's surveillance will included routine carotid duplex  studies which will be completed in: 9 months, at which time the patient will be re-evaluated.   In regards to his L CIA stenosis, will schedule him for Ao, B pelvic angiogram, and possible R iliac intervention. I discussed with the patient the nature of angiographic procedures, especially the limited patencies of any endovascular intervention.   The patient is aware of that the risks of an angiographic procedure include but are not limited to: bleeding, infection, access site complications, renal failure, embolization, rupture of vessel, dissection, arteriovenous fistula, possible need for emergent surgical intervention, possible need for surgical procedures to treat the patient's pathology, anaphylactic reaction to contrast, and stroke and death.   The patient is aware of the risks and agrees to proceed.  He is tenatively scheduled for 2 NOV 17. I emphasized the importance of routine surveillance of the carotid arteries as recurrence of stenosis is possible, especially with proper management of underlying atherosclerotic disease. The patient agrees to participate in their maximal medical care and routine surveillance.  Thank you for allowing us to participate in this patient's care.  Leonides SakeBrian Chen, MD, FACS [facility] [place] Office: [phone] Pager: [phone]

## 2016-07-17 ENCOUNTER — Ambulatory Visit (INDEPENDENT_AMBULATORY_CARE_PROVIDER_SITE_OTHER): Payer: Medicare Other | Admitting: Vascular Surgery

## 2016-07-17 ENCOUNTER — Other Ambulatory Visit: Payer: Self-pay

## 2016-07-17 ENCOUNTER — Encounter: Payer: Self-pay | Admitting: Vascular Surgery

## 2016-07-17 VITALS — BP 152/81 | HR 72 | Temp 97.0°F | Resp 16

## 2016-07-17 DIAGNOSIS — I739 Peripheral vascular disease, unspecified: Principal | ICD-10-CM

## 2016-07-17 DIAGNOSIS — I779 Disorder of arteries and arterioles, unspecified: Secondary | ICD-10-CM

## 2016-07-17 DIAGNOSIS — I70213 Atherosclerosis of native arteries of extremities with intermittent claudication, bilateral legs: Secondary | ICD-10-CM

## 2016-07-18 ENCOUNTER — Encounter (HOSPITAL_COMMUNITY)
Admission: RE | Disposition: A | Discharge status: Home / Self Care | Payer: Self-pay | Source: Ambulatory Visit | Attending: Vascular Surgery

## 2016-07-18 ENCOUNTER — Ambulatory Visit (HOSPITAL_COMMUNITY)
Admission: RE | Admit: 2016-07-18 | Discharge: 2016-07-18 | Disposition: A | Discharge status: Home / Self Care | Payer: Medicare Other | Source: Ambulatory Visit | Attending: Vascular Surgery | Admitting: Vascular Surgery

## 2016-07-18 DIAGNOSIS — Z9889 Other specified postprocedural states: Secondary | ICD-10-CM | POA: Insufficient documentation

## 2016-07-18 DIAGNOSIS — K219 Gastro-esophageal reflux disease without esophagitis: Secondary | ICD-10-CM | POA: Insufficient documentation

## 2016-07-18 DIAGNOSIS — I10 Essential (primary) hypertension: Secondary | ICD-10-CM | POA: Insufficient documentation

## 2016-07-18 DIAGNOSIS — M549 Dorsalgia, unspecified: Secondary | ICD-10-CM | POA: Diagnosis not present

## 2016-07-18 DIAGNOSIS — Z9842 Cataract extraction status, left eye: Secondary | ICD-10-CM | POA: Diagnosis not present

## 2016-07-18 DIAGNOSIS — M1990 Unspecified osteoarthritis, unspecified site: Secondary | ICD-10-CM | POA: Insufficient documentation

## 2016-07-18 DIAGNOSIS — X58XXXA Exposure to other specified factors, initial encounter: Secondary | ICD-10-CM | POA: Diagnosis not present

## 2016-07-18 DIAGNOSIS — Z79899 Other long term (current) drug therapy: Secondary | ICD-10-CM | POA: Insufficient documentation

## 2016-07-18 DIAGNOSIS — Z7982 Long term (current) use of aspirin: Secondary | ICD-10-CM | POA: Insufficient documentation

## 2016-07-18 DIAGNOSIS — Z7902 Long term (current) use of antithrombotics/antiplatelets: Secondary | ICD-10-CM | POA: Diagnosis not present

## 2016-07-18 DIAGNOSIS — Z9841 Cataract extraction status, right eye: Secondary | ICD-10-CM | POA: Insufficient documentation

## 2016-07-18 DIAGNOSIS — F17290 Nicotine dependence, other tobacco product, uncomplicated: Secondary | ICD-10-CM | POA: Diagnosis not present

## 2016-07-18 DIAGNOSIS — T82858A Stenosis of vascular prosthetic devices, implants and grafts, initial encounter: Secondary | ICD-10-CM | POA: Insufficient documentation

## 2016-07-18 DIAGNOSIS — Z8673 Personal history of transient ischemic attack (TIA), and cerebral infarction without residual deficits: Secondary | ICD-10-CM | POA: Diagnosis not present

## 2016-07-18 DIAGNOSIS — Z87442 Personal history of urinary calculi: Secondary | ICD-10-CM | POA: Diagnosis not present

## 2016-07-18 DIAGNOSIS — Z95820 Peripheral vascular angioplasty status with implants and grafts: Secondary | ICD-10-CM | POA: Diagnosis not present

## 2016-07-18 DIAGNOSIS — I70219 Atherosclerosis of native arteries of extremities with intermittent claudication, unspecified extremity: Secondary | ICD-10-CM | POA: Diagnosis present

## 2016-07-18 DIAGNOSIS — Z791 Long term (current) use of non-steroidal anti-inflammatories (NSAID): Secondary | ICD-10-CM | POA: Insufficient documentation

## 2016-07-18 DIAGNOSIS — I70211 Atherosclerosis of native arteries of extremities with intermittent claudication, right leg: Secondary | ICD-10-CM | POA: Diagnosis not present

## 2016-07-18 DIAGNOSIS — T82856A Stenosis of peripheral vascular stent, initial encounter: Secondary | ICD-10-CM | POA: Diagnosis not present

## 2016-07-18 DIAGNOSIS — I739 Peripheral vascular disease, unspecified: Secondary | ICD-10-CM | POA: Insufficient documentation

## 2016-07-18 DIAGNOSIS — G8929 Other chronic pain: Secondary | ICD-10-CM | POA: Diagnosis not present

## 2016-07-18 DIAGNOSIS — I4891 Unspecified atrial fibrillation: Secondary | ICD-10-CM | POA: Insufficient documentation

## 2016-07-18 HISTORY — PX: PERIPHERAL VASCULAR CATHETERIZATION: SHX172C

## 2016-07-18 LAB — POCT I-STAT, CHEM 8
BUN: 10 mg/dL (ref 6–20)
CALCIUM ION: 1.11 mmol/L — AB (ref 1.15–1.40)
CHLORIDE: 101 mmol/L (ref 101–111)
Creatinine, Ser: 1.5 mg/dL — ABNORMAL HIGH (ref 0.61–1.24)
Glucose, Bld: 88 mg/dL (ref 65–99)
HCT: 39 % (ref 39.0–52.0)
HEMOGLOBIN: 13.3 g/dL (ref 13.0–17.0)
POTASSIUM: 3.8 mmol/L (ref 3.5–5.1)
SODIUM: 140 mmol/L (ref 135–145)
TCO2: 29 mmol/L (ref 0–100)

## 2016-07-18 LAB — POCT ACTIVATED CLOTTING TIME
ACTIVATED CLOTTING TIME: 274 s
ACTIVATED CLOTTING TIME: 208 s
ACTIVATED CLOTTING TIME: 175 s

## 2016-07-18 SURGERY — ABDOMINAL AORTOGRAM W/LOWER EXTREMITY
Laterality: Right

## 2016-07-18 MED ORDER — HEPARIN SODIUM (PORCINE) 1000 UNIT/ML IJ SOLN
INTRAMUSCULAR | Status: DC | PRN
  Administered 2016-07-18: 6000 [IU] via INTRAVENOUS

## 2016-07-18 MED ORDER — LIDOCAINE HCL (PF) 1 % IJ SOLN
INTRAMUSCULAR | Status: AC
  Filled 2016-07-18: qty 30

## 2016-07-18 MED ORDER — FENTANYL CITRATE (PF) 100 MCG/2ML IJ SOLN
INTRAMUSCULAR | Status: AC
  Filled 2016-07-18: qty 2

## 2016-07-18 MED ORDER — FENTANYL CITRATE (PF) 100 MCG/2ML IJ SOLN
INTRAMUSCULAR | Status: DC | PRN
  Administered 2016-07-18: 50 ug via INTRAVENOUS

## 2016-07-18 MED ORDER — MIDAZOLAM HCL 2 MG/2ML IJ SOLN
INTRAMUSCULAR | Status: DC | PRN
  Administered 2016-07-18: 1 mg via INTRAVENOUS

## 2016-07-18 MED ORDER — LIDOCAINE HCL (PF) 1 % IJ SOLN
INTRAMUSCULAR | Status: DC | PRN
  Administered 2016-07-18: 5 mL

## 2016-07-18 MED ORDER — ACETAMINOPHEN 325 MG PO TABS: 650.0000 mg | ORAL_TABLET | ORAL | Status: DC | PRN

## 2016-07-18 MED ORDER — HYDRALAZINE HCL 20 MG/ML IJ SOLN
INTRAMUSCULAR | Status: AC
  Filled 2016-07-18: qty 1

## 2016-07-18 MED ORDER — MIDAZOLAM HCL 2 MG/2ML IJ SOLN
INTRAMUSCULAR | Status: AC
  Filled 2016-07-18: qty 2

## 2016-07-18 MED ORDER — HYDRALAZINE HCL 20 MG/ML IJ SOLN
INTRAMUSCULAR | Status: DC | PRN
  Administered 2016-07-18 (×2): 10 mg via INTRAVENOUS

## 2016-07-18 MED ORDER — IODIXANOL 320 MG/ML IV SOLN
INTRAVENOUS | Status: DC | PRN
Start: 2016-07-18 — End: 2016-07-18
  Administered 2016-07-18: 90 mL via INTRA_ARTERIAL

## 2016-07-18 MED ORDER — HEPARIN SODIUM (PORCINE) 1000 UNIT/ML IJ SOLN
INTRAMUSCULAR | Status: AC
  Filled 2016-07-18: qty 1

## 2016-07-18 MED ORDER — HYDRALAZINE HCL 20 MG/ML IJ SOLN
10.0000 mg | Freq: Once | INTRAMUSCULAR | Status: AC
  Administered 2016-07-18: 10 mg via INTRAVENOUS

## 2016-07-18 MED ORDER — ATROPINE SULFATE 1 MG/10ML IJ SOSY
PREFILLED_SYRINGE | INTRAMUSCULAR | Status: AC
  Filled 2016-07-18: qty 10

## 2016-07-18 MED ORDER — HEPARIN (PORCINE) IN NACL 2-0.9 UNIT/ML-% IJ SOLN
INTRAMUSCULAR | Status: DC | PRN
  Administered 2016-07-18: 1000 mL

## 2016-07-18 MED ORDER — OXYCODONE-ACETAMINOPHEN 5-325 MG PO TABS: 1.0000 | ORAL_TABLET | Freq: Four times a day (QID) | ORAL | Status: DC | PRN

## 2016-07-18 MED ORDER — SODIUM CHLORIDE 0.9 % IV SOLN: 1.0000 mL/kg/h | INTRAVENOUS | Status: DC

## 2016-07-18 MED ORDER — ONDANSETRON HCL 4 MG/2ML IJ SOLN
INTRAMUSCULAR | Status: AC
  Filled 2016-07-18: qty 2

## 2016-07-18 MED ORDER — ONDANSETRON HCL 4 MG/2ML IJ SOLN
4.0000 mg | Freq: Once | INTRAMUSCULAR | Status: AC
  Administered 2016-07-18: 4 mg via INTRAVENOUS

## 2016-07-18 MED ORDER — HEPARIN (PORCINE) IN NACL 2-0.9 UNIT/ML-% IJ SOLN
INTRAMUSCULAR | Status: AC
  Filled 2016-07-18: qty 1000

## 2016-07-18 MED ORDER — SODIUM CHLORIDE 0.9 % IV SOLN
INTRAVENOUS | Status: DC
  Administered 2016-07-18: 12:00:00 via INTRAVENOUS

## 2016-07-18 SURGICAL SUPPLY — 18 items
BALLN ARMADA 7X40X80 (BALLOONS) ×3
BALLN IN.PACT DCB 7X40 (BALLOONS) ×3
BALLOON ARMADA 7X40X80 (BALLOONS) ×2 IMPLANT
CATH OMNI FLUSH 5F 65CM (CATHETERS) ×3 IMPLANT
CATH STRAIGHT 5FR 65CM (CATHETERS) ×3 IMPLANT
COVER PRB 48X5XTLSCP FOLD TPE (BAG) ×2 IMPLANT
COVER PROBE 5X48 (BAG) ×1
DCB IN.PACT 7X40 (BALLOONS) ×2 IMPLANT
KIT ENCORE 26 ADVANTAGE (KITS) ×3 IMPLANT
KIT MICROINTRODUCER STIFF 5F (SHEATH) ×3 IMPLANT
KIT PV (KITS) ×3 IMPLANT
SHEATH BRITE TIP 6FR 35CM (SHEATH) ×3 IMPLANT
SHEATH PINNACLE 5F 10CM (SHEATH) ×3 IMPLANT
SYR MEDRAD MARK V 150ML (SYRINGE) ×3 IMPLANT
TRANSDUCER W/STOPCOCK (MISCELLANEOUS) ×3 IMPLANT
TRAY PV CATH (CUSTOM PROCEDURE TRAY) ×3 IMPLANT
WIRE BENTSON .035X145CM (WIRE) ×3 IMPLANT
WIRE HI TORQ VERSACORE J 260CM (WIRE) ×3 IMPLANT

## 2016-07-18 NOTE — H&P (View-Only) (Signed)
Postoperative Visit   History of Present Illness  Adam Mcclain is a 65 y.o. male who presents for postoperative follow-up for: R CEA (Date: 06/26/16).   R ICA plaque was near occluding and mildly calcified.  The patient's neck incision is healing.  The patient has had no stroke or TIA symptoms.  The patient also returns for discussion of reintervention on his R CIA stent which had velocities 315-364 c/s (x5.6 ratio).  The patient continues to have some intermittent claudication without rest pain.   Past Medical History:  Diagnosis Date  . Arthritis   . Atrial fibrillation (HCC)   . Chronic back pain   . Dysrhythmia    A-Fib  . GERD (gastroesophageal reflux disease)   . History of kidney stones   . Hypertension   . Stroke [facility])     Past Surgical History:  Procedure Laterality Date  . ABDOMINAL AORTAGRAM  03-25-13   with Stent  . ABDOMINAL AORTAGRAM N/A 03/25/2013   Procedure: ABDOMINAL Ronny Flurry;  Surgeon: Fransisco Hertz, MD;  Location: [facility] CATH LAB;  Service: Cardiovascular;  Laterality: N/A;  . CATARACT EXTRACTION Bilateral   . CHOLECYSTECTOMY    . ENDARTERECTOMY Right 06/26/2016   Procedure: ENDARTERECTOMY CAROTID RIGHT;  Surgeon: Fransisco Hertz, MD;  Location: [facility] OR;  Service: Vascular;  Laterality: Right;  . EYE SURGERY    . KIDNEY STONE SURGERY    . LOWER EXTREMITY ANGIOGRAM N/A 03/25/2013   Procedure: LOWER EXTREMITY ANGIOGRAM;  Surgeon: Fransisco Hertz, MD;  Location: [facility] CATH LAB;  Service: Cardiovascular;  Laterality: N/A;  . PATCH ANGIOPLASTY Right 06/26/2016   Procedure: PATCH ANGIOPLASTY RIGHT CAROTID ARTERY;  Surgeon: Fransisco Hertz, MD;  Location: [facility] OR;  Service: Vascular;  Laterality: Right;  . PERCUTANEOUS STENT INTERVENTION Right 03/25/2013   Procedure: PERCUTANEOUS STENT INTERVENTION;  Surgeon: Fransisco Hertz, MD;  Location: [facility] CATH LAB;  Service: Cardiovascular;  Laterality: Right;  icast stent to rt common iliac artery  . YAG LASER APPLICATION Left 02/20/2015   Procedure: YAG LASER APPLICATION;  Surgeon: Susa Simmonds, MD;  Location: [facility] ORS;  Service: Ophthalmology;  Laterality: Left;    Social History   Social History  . Marital status: Widowed    Spouse name: N/A  . Number of children: N/A  . Years of education: N/A   Occupational History  . Not on file.   Social History Main Topics  . Smoking status: Current Every Day Smoker    Types: Cigars  . Smokeless tobacco: Never Used     Comment: 7 cigars a day  . Alcohol use No  . Drug use: No  . Sexual activity: Not on file   Other Topics Concern  . Not on file   Social History Narrative  . No narrative on file    Family History  Problem Relation Age of Onset  . Cancer Father   . Diabetes Father   . Hypertension Father     Current Outpatient Prescriptions  Medication Sig Dispense Refill  . ALPRAZolam (XANAX) 0.5 MG tablet Take 0.5 mg by mouth daily.    Marland Kitchen aspirin 81 MG tablet Take 81 mg by mouth daily.    Marland Kitchen atorvastatin (LIPITOR) 40 MG tablet Take 40 mg by mouth daily.     Marland Kitchen buPROPion (WELLBUTRIN SR) 150 MG 12 hr tablet Take 150 mg by mouth 2 (two) times daily.    . citalopram (CELEXA) 20 MG tablet Take 20 mg by mouth daily.     Marland Kitchen  clopidogrel (PLAVIX) 75 MG tablet Take 1 tablet (75 mg total) by mouth daily. 30 tablet 11  . etodolac (LODINE) 400 MG tablet Take 400 mg by mouth 2 (two) times daily.    Marland Kitchen. lisinopril-hydrochlorothiazide (PRINZIDE,ZESTORETIC) 20-12.5 MG per tablet Take 1 tablet by mouth daily.     Marland Kitchen. lovastatin (MEVACOR) 20 MG tablet Take 20 mg by mouth at bedtime.    . Multiple Vitamin (MULTIVITAMIN WITH MINERALS) TABS Take 1 tablet by mouth daily.    . traMADol (ULTRAM) 50 MG tablet Take 1 tablet (50 mg total) by mouth 3 (three) times daily as needed for moderate pain. 8 tablet 0  . traZODone (DESYREL) 50 MG tablet Take 50 mg by mouth at bedtime.     No current facility-administered medications for this visit.      Allergies  Allergen Reactions  . No Known  Allergies      REVIEW OF SYSTEMS:  (Positives checked otherwise negative)  CARDIOVASCULAR:   [ ]  chest pain,  [ ]  chest pressure,  [ ]  palpitations,  [ ]  shortness of breath when laying flat,  [ ]  shortness of breath with exertion,   [x]  pain in feet when walking,  [ ]  pain in feet when laying flat, [ ]  history of blood clot in veins (DVT),  [ ]  history of phlebitis,  [ ]  swelling in legs,  [ ]  varicose veins  PULMONARY:   [ ]  productive cough,  [ ]  asthma,  [ ]  wheezing  NEUROLOGIC:   [ ]  weakness in arms or legs,  [ ]  numbness in arms or legs,  [ ]  difficulty speaking or slurred speech,  [ ]  temporary loss of vision in one eye,  [ ]  dizziness  HEMATOLOGIC:   [ ]  bleeding problems,  [ ]  problems with blood clotting too easily  MUSCULOSKEL:   [ ]  joint pain, [ ]  joint swelling  GASTROINTEST:   [ ]  vomiting blood,  [ ]  blood in stool     GENITOURINARY:   [ ]  burning with urination,  [ ]  blood in urine  PSYCHIATRIC:   [ ]  history of major depression  INTEGUMENTARY:   [ ]  rashes,  [ ]  ulcers  CONSTITUTIONAL:   [ ]  fever,  [ ]  chills   Physical Examination  Vitals:   07/17/16 0915 07/17/16 0917  BP: 123/72 (!) 152/81  Pulse: 97 72  Resp: 16   Temp: 97 F (36.1 C)    Pulmonary: Sym exp, good air movt, CTAB, no rales, rhonchi, & wheezing  Cardiac: RRR, Nl S1, S2, no Murmurs, rubs or gallops  R Neck: Incision is healing, dermabond still in place  Neuro: CN 2-12 are intact , Motor strength is 5/5 bilaterally, sensation is grossly intact   Medical Decision Making  Adam Mcclain is a 65 y.o. 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 I discussed in depth with the patient the nature of atherosclerosis, and emphasized the importance of maximal medical management including strict control of blood pressure, blood glucose, and lipid levels, obtaining regular  exercise, anti-platelet use and cessation of smoking.   The patient is currently on an antiplatelet: ASA. The patient is currently on a statin: Lipitor.   The patient is aware that without maximal medical management the underlying atherosclerotic disease process will progress, limiting the benefit of any interventions. The patient's surveillance will included routine carotid duplex  studies which will be completed in: 9 months, at which time the patient will be re-evaluated.   In regards to his L CIA stenosis, will schedule him for Ao, B pelvic angiogram, and possible R iliac intervention. I discussed with the patient the nature of angiographic procedures, especially the limited patencies of any endovascular intervention.   The patient is aware of that the risks of an angiographic procedure include but are not limited to: bleeding, infection, access site complications, renal failure, embolization, rupture of vessel, dissection, arteriovenous fistula, possible need for emergent surgical intervention, possible need for surgical procedures to treat the patient's pathology, anaphylactic reaction to contrast, and stroke and death.   The patient is aware of the risks and agrees to proceed.  He is tenatively scheduled for 2 NOV 17. I emphasized the importance of routine surveillance of the carotid arteries as recurrence of stenosis is possible, especially with proper management of underlying atherosclerotic disease. The patient agrees to participate in their maximal medical care and routine surveillance.  Thank you for allowing us to participate in this patient's care.  Leonides SakeBrian Duayne Brideau, MD, FACS [facility] [place] Office: [phone] Pager: [phone]

## 2016-07-18 NOTE — Op Note (Addendum)
OPERATIVE NOTE   PROCEDURE: 1.  Right common femoral artery cannulation under ultrasound guidance 2.  Placement of catheter in aorta 3.  Aortogram 4.  Conscious sedation for 36 minutes 5.  Bland and drug coated angioplasty of right common iliac artery (7 mm x 40 mm) 6.  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  POST-OPERATIVE DIAGNOSIS: same as above   SURGEON: Adele Barthel, MD  ANESTHESIA: conscious sedation  ESTIMATED BLOOD LOSS: 50 cc  CONTRAST: 90 cc  FINDING(S):  Aorta: patent, 30 % stenosis in infrarenal segment  Superior mesenteric artery: patent Celiac artery: not well visualized Inferior mesenteric artery: patent    Right Left  RA Patent, small with 75-90% stenosis Patent  CIA Patent stent with minimal visual evidence of in-stent restenosis, however gradient = 20 mm Hg, resolution in-stenosis after serial angioplasty   EIA Patent Patent  IIA Patent, with >90% distal stenosis Patent with >90% stenosis at takeoff segment  CFA Patent   SFA Patent   PFA Patent   Pop Occluded segment from knee distal   Trif Reconstitution of tibioperoneal trunk from geniculate collaterals   AT Patent proximally, occludes shortly after take off   Pero Patent, delayed filling   PT Occluded proximally, reconstitutes via collaterals    SPECIMEN(S):  none  INDICATIONS:   Adam Mcclain is a 65 y.o. male who presents with surveillance studies demonstrating abnormal velocities in his right common iliac artery stent consistent with in-stent restenosis and decreased ABI.  The patient presents for: aortogram, right leg runoff, and possible right iliac intervention..  I discussed with the patient the nature of angiographic procedures, especially the limited patencies of any endovascular intervention.  The patient is aware of that the risks of an angiographic procedure include but are  not limited to: bleeding, infection, access site complications, renal failure, embolization, rupture of vessel, dissection, possible need for emergent surgical intervention, possible need for surgical procedures to treat the patient's pathology, and stroke and death.  The patient is aware of the risks and agrees to proceed.  DESCRIPTION: After full informed consent was obtained from the patient, the patient was brought back to the angiography suite.  The patient was placed supine upon the angiography table and connected to cardiopulmonary monitoring equipment.  The patient was then given conscious sedation, the amounts of which are documented in the patient's chart.  A circulating radiologic technician maintained continuous monitoring of the patient's cardiopulmonary status.  Additionally, the control room radiologic technician provided backup monitoring throughout the procedure.  The patient was prepped and drape in the standard fashion for an angiographic procedure.  At this point, attention was turned to the right groin.  Under ultrasound guidance, the subcutaneous tissue surrounding the right common femoral artery was anesthesized with 1% lidocaine with epinephrine.  The artery was then cannulated with a micropuncture needle.  The microwire was advanced into the iliac arterial system.  The needle was exchanged for a microsheath, which was loaded into the common femoral artery over the wire.  The microwire was exchanged for a [facility] wire which was advanced into the aorta.  The microsheath was then exchanged for a 5-Fr sheath which was loaded into the common femoral artery.    The Omniflush catheter was then loaded over the wire up to the level of L1.  The catheter was connected to the power injector circuit.  After de-airring and  de-clotting the circuit, a power injector aortogram was completed.  I could not appreciate a significant in-stent restenosis in the right common iliac artery stent but there was  some abnormal appearance to the stent, so I exchanged the catheter for a end-hole catheter and completed a drag through gradient, which was measured to be 20 mm Hg.  Subsequently, I felt angioplasty of the in-stent restenosis was necessary.  I exchanged the wire for a long Versacore wire.  The sheath was exchanged for a long 6-Fr sheath.  The patient was given 6000 units of Heparin intravenously, which was a therapeutic bolus. I selected a 7 mm x 40 mm bland  angioplasty balloon and centered it on the right common iliac artery stent.  The balloon was inflated at 6 atm for 2 minutes.  I then exchanged this balloon for an 7 mm x 30 mm Admiral drug coated balloon, which was centered on the stent and inflated to 8 atm for 3 minutes.  At this point, the balloon was deflated and removed.  Completion hand injection demonstrated total resolution of the in-stent stenosis.  At this point, I pulled the sheath into the distal external iliac artery.  The right sheath was connected to the power injector circuit.  An automated right leg runoff was completed.  The findings are listed above.   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  The abdominal findings are similar suspicious for possible small abdominal aortic aneurysm.  These findings will be investigated further with duplex upon follow up.  The sheath was aspirated.  No clots were present and the sheath was reloaded with heparinized saline.     COMPLICATIONS: none  CONDITION: stable   Adele Barthel, MD [facility] [place] Office: [phone] Pager: [phone]  07/18/2016, 1:37 PM

## 2016-07-18 NOTE — Discharge Instructions (Signed)
Angiogram, Care After °Refer to this sheet in the next few weeks. These instructions provide you with information about caring for yourself after your procedure. Your health care provider may also give you more specific instructions. Your treatment has been planned according to current medical practices, but problems sometimes occur. Call your health care provider if you have any problems or questions after your procedure. °WHAT TO EXPECT AFTER THE PROCEDURE °After your procedure, it is typical to have the following: °· Bruising at the catheter insertion site that usually fades within 1-2 weeks. °· Blood collecting in the tissue (hematoma) that may be painful to the touch. It should usually decrease in size and tenderness within 1-2 weeks. °HOME CARE INSTRUCTIONS °· Take medicines only as directed by your health care provider. °· You may shower 24-48 hours after the procedure or as directed by your health care provider. Remove the bandage (dressing) and gently wash the site with plain soap and water. Pat the area dry with a clean towel. Do not rub the site, because this may cause bleeding. °· Do not take baths, swim, or use a hot tub until your health care provider approves. °· Check your insertion site every day for redness, swelling, or drainage. °· Do not apply powder or lotion to the site. °· Do not lift over 10 lb (4.5 kg) for 5 days after your procedure or as directed by your health care provider. °· Ask your health care provider when it is okay to: °¨ Return to work or school. °¨ Resume usual physical activities or sports. °¨ Resume sexual activity. °· Do not drive home if you are discharged the same day as the procedure. Have someone else drive you. °· You may drive 24 hours after the procedure unless otherwise instructed by your health care provider. °· Do not operate machinery or power tools for 24 hours after the procedure or as directed by your health care provider. °· If your procedure was done as an  outpatient procedure, which means that you went home the same day as your procedure, a responsible adult should be with you for the first 24 hours after you arrive home. °· Keep all follow-up visits as directed by your health care provider. This is important. °SEEK MEDICAL CARE IF: °· You have a fever. °· You have chills. °· You have increased bleeding from the catheter insertion site. Hold pressure on the site.  CALL 911 °SEEK IMMEDIATE MEDICAL CARE IF: °· You have unusual pain at the catheter insertion site. °· You have redness, warmth, or swelling at the catheter insertion site. °· You have drainage (other than a small amount of blood on the dressing) from the catheter insertion site. °· The catheter insertion site is bleeding, and the bleeding does not stop after 30 minutes of holding steady pressure on the site. °· The area near or just beyond the catheter insertion site becomes pale, cool, tingly, or numb. °  °This information is not intended to replace advice given to you by your health care provider. Make sure you discuss any questions you have with your health care provider. °  °Document Released: 03/21/2005 Document Revised: 09/23/2014 Document Reviewed: 02/03/2013 °Elsevier Interactive Patient Education ©2016 [employer] ° °

## 2016-07-18 NOTE — Interval H&P Note (Signed)
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 After consideration of risks, benefits and other options for treatment, the patient has consented to  Procedure(s): Abdominal Aortogram w/Lower Extremity (N/A) as a surgical intervention .  The patient's history has been reviewed, patient examined, no change in status, stable for surgery.  I have reviewed the patient's chart and labs.  Questions were answered to the patient's satisfaction.     Leonides SakeBrian Candido Flott

## 2016-07-19 ENCOUNTER — Other Ambulatory Visit: Payer: Self-pay | Admitting: *Deleted

## 2016-07-19 ENCOUNTER — Encounter (HOSPITAL_COMMUNITY): Payer: Self-pay | Admitting: Vascular Surgery

## 2016-07-19 DIAGNOSIS — I714 Abdominal aortic aneurysm, without rupture, unspecified: Secondary | ICD-10-CM

## 2016-07-19 DIAGNOSIS — I739 Peripheral vascular disease, unspecified: Secondary | ICD-10-CM

## 2016-08-21 ENCOUNTER — Encounter: Payer: Self-pay | Admitting: Vascular Surgery

## 2016-08-26 NOTE — Progress Notes (Signed)
Postoperative Visit   History of Present Illness  Adam Mcclain is a 65 y.o. male who presents for postoperative follow-up from procedure on Date: 07/18/16: DCB PTA R CIA .  The patient's prior procedures include:  1. 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 R CEA with BPA for R asx carotid stenosis>80% (06/26/16) 3. R CIA PTA+S for intermittent claudication (03/25/13)  The patient does not note any significant change in leg sx.  The patient's current symptoms are: intermittent claudication.  The patient is able to complete their activities of daily living.    Past Medical History, Past Surgical History, Social History, Family History, Medications, Allergies, and Review of Systems are unchanged from previous evaluation on 07/18/16.  Current Outpatient Prescriptions  Medication Sig Dispense Refill  . ALPRAZolam (XANAX) 0.5 MG tablet Take 0.5 mg by mouth daily.    Marland Kitchen. aspirin 81 MG tablet Take 81 mg by mouth daily.    Marland Kitchen. atorvastatin (LIPITOR) 40 MG tablet Take 40 mg by mouth daily.     Marland Kitchen. buPROPion (WELLBUTRIN SR) 150 MG 12 hr tablet Take 150 mg by mouth 2 (two) times daily.    . citalopram (CELEXA) 20 MG tablet Take 20 mg by mouth daily.     . clopidogrel (PLAVIX) 75 MG tablet Take 1 tablet (75 mg total) by mouth daily. 30 tablet 11  . etodolac (LODINE) 400 MG tablet Take 400 mg by mouth 2 (two) times daily.    Marland Kitchen. lisinopril-hydrochlorothiazide (PRINZIDE,ZESTORETIC) 20-12.5 MG per tablet Take 1 tablet by mouth daily.     Marland Kitchen. lovastatin (MEVACOR) 20 MG tablet Take 20 mg by mouth at bedtime.    . Multiple Vitamin (MULTIVITAMIN WITH MINERALS) TABS Take 1 tablet by mouth daily.    . traMADol (ULTRAM) 50 MG tablet Take 1 tablet (50 mg total) by mouth 3 (three) times daily as needed for moderate pain. 8 tablet 0  . traZODone (DESYREL) 50 MG tablet Take 50 mg by mouth at bedtime.     No current facility-administered medications for this visit.     Allergies  Allergen Reactions  . No  Known Allergies      For VQI Use Only  PRE-ADM LIVING: Home  AMB STATUS: Ambulatory   Physical Examination  Vitals:   08/30/16 0915  BP: 121/87  Pulse: (!) 53  Resp: 16  Temp: (!) 96.7 F (35.9 C)  SpO2: 100%  Weight: 131 lb (59.4 kg)  Height: 5\' 6"  (1.676 m)    Body mass index is 21.14 kg/m.  General: Alert, O x 3, WD,NAD  Pulmonary: Sym exp, good B air movt,CTA B  Cardiac: RRR, Nl S1, S2, no Murmurs, No rubs, No S3,S4  Vascular: Vessel Right Left  Radial Palpable Palpable  Brachial Palpable Palpable  Carotid Palpable, No Bruit Palpable, No Bruit  Aorta Not palpable N/A  Femoral Palpable Palpable  Popliteal Not palpable Not palpable  PT Not palpable Not palpable  DP Not palpable Not palpable   Gastrointestinal: soft, non-distended, non-tender to palpation, No guarding or rebound, no HSM, no masses, no CVAT B, No palpable prominent aortic pulse,    Musculoskeletal: M/S 5/5 throughout  , Extremities without ischemic changes  , No edema present,     Neurologic: Pain and light touch intact in extremities  , Motor exam as listed above  R Aortoiliac duplex (08/30/2016)  Ao: 73 c/s  Mark improvement in in-stent velocities: 187-197 c/s (<3 ratio)  BLE arterial duplex (08/30/2016)  R:  CFA, SFA, pop: bi-tri  Distal pop occluded  AT, PT, peroneal: damp mono  L:  CFA: biphasic  PFA: stenotic  SFA: occluded  Pop reconstitution  AT, PT, peroneal: damp mono   Medical Decision Making  Adam Gladerry F Dearmond is a 65 y.o. male who presents s/p R CIA DCB PTA for in-stent restenosis of R CIA PTA+S, s/p R CEA  Based on his angiographic findings, this patient needs: q3 month ABI, Aortoiliac duplex. Pt's B carotid duplex is already schedule for 9 month from his R CEA (roughly July 2018) I discussed in depth with the patient the nature of atherosclerosis, and emphasized the importance of maximal medical management including strict control of blood pressure,  blood glucose, and lipid levels, obtaining regular exercise, and cessation of smoking.  The patient is aware that without maximal medical management the underlying atherosclerotic disease process will progress, limiting the benefit of any interventions. The patient is currently on a statin:  Lipitor. The patient is currently on an anti-platelet: ASA.  Thank you for allowing us to participate in this patient's care.   Leonides SakeBrian Estalene Bergey, MD, FACS [facility] [place] Office: [phone] Pager: [phone]

## 2016-08-27 ENCOUNTER — Ambulatory Visit (HOSPITAL_COMMUNITY)
Admission: RE | Admit: 2016-08-27 | Discharge: 2016-08-27 | Disposition: A | Discharge status: Home / Self Care | Payer: Medicare Other | Source: Ambulatory Visit | Attending: Vascular Surgery | Admitting: Vascular Surgery

## 2016-08-27 DIAGNOSIS — I714 Abdominal aortic aneurysm, without rupture, unspecified: Secondary | ICD-10-CM

## 2016-08-27 DIAGNOSIS — I739 Peripheral vascular disease, unspecified: Secondary | ICD-10-CM

## 2016-08-30 ENCOUNTER — Encounter: Payer: Self-pay | Admitting: Vascular Surgery

## 2016-08-30 ENCOUNTER — Ambulatory Visit (INDEPENDENT_AMBULATORY_CARE_PROVIDER_SITE_OTHER): Payer: Medicare Other | Admitting: Vascular Surgery

## 2016-08-30 ENCOUNTER — Other Ambulatory Visit: Payer: Self-pay | Admitting: Vascular Surgery

## 2016-08-30 ENCOUNTER — Ambulatory Visit (HOSPITAL_COMMUNITY)
Admission: RE | Admit: 2016-08-30 | Discharge: 2016-08-30 | Disposition: A | Discharge status: Home / Self Care | Payer: Medicare Other | Source: Ambulatory Visit | Attending: Vascular Surgery | Admitting: Vascular Surgery

## 2016-08-30 VITALS — BP 121/87 | HR 53 | Temp 96.7°F | Resp 16 | Ht 66.0 in | Wt 131.0 lb

## 2016-08-30 DIAGNOSIS — I739 Peripheral vascular disease, unspecified: Secondary | ICD-10-CM | POA: Diagnosis not present

## 2016-08-30 DIAGNOSIS — I714 Abdominal aortic aneurysm, without rupture, unspecified: Secondary | ICD-10-CM

## 2016-08-30 DIAGNOSIS — I70213 Atherosclerosis of native arteries of extremities with intermittent claudication, bilateral legs: Secondary | ICD-10-CM

## 2016-10-01 DIAGNOSIS — I10 Essential (primary) hypertension: Secondary | ICD-10-CM | POA: Diagnosis not present

## 2016-10-01 DIAGNOSIS — E782 Mixed hyperlipidemia: Secondary | ICD-10-CM | POA: Diagnosis not present

## 2016-10-01 DIAGNOSIS — N183 Chronic kidney disease, stage 3 (moderate): Secondary | ICD-10-CM | POA: Diagnosis not present

## 2016-10-03 DIAGNOSIS — Z6821 Body mass index (BMI) 21.0-21.9, adult: Secondary | ICD-10-CM | POA: Diagnosis not present

## 2016-10-03 DIAGNOSIS — I10 Essential (primary) hypertension: Secondary | ICD-10-CM | POA: Diagnosis not present

## 2016-10-03 DIAGNOSIS — E782 Mixed hyperlipidemia: Secondary | ICD-10-CM | POA: Diagnosis not present

## 2016-10-23 DIAGNOSIS — Z9842 Cataract extraction status, left eye: Secondary | ICD-10-CM | POA: Diagnosis not present

## 2016-10-23 DIAGNOSIS — H26491 Other secondary cataract, right eye: Secondary | ICD-10-CM | POA: Diagnosis not present

## 2016-10-23 DIAGNOSIS — H52222 Regular astigmatism, left eye: Secondary | ICD-10-CM | POA: Diagnosis not present

## 2016-10-23 DIAGNOSIS — H5202 Hypermetropia, left eye: Secondary | ICD-10-CM | POA: Diagnosis not present

## 2016-11-07 DIAGNOSIS — H26491 Other secondary cataract, right eye: Secondary | ICD-10-CM | POA: Diagnosis not present

## 2016-11-07 DIAGNOSIS — Z961 Presence of intraocular lens: Secondary | ICD-10-CM | POA: Diagnosis not present

## 2016-11-20 DIAGNOSIS — H26491 Other secondary cataract, right eye: Secondary | ICD-10-CM | POA: Diagnosis not present

## 2016-12-06 ENCOUNTER — Encounter (HOSPITAL_COMMUNITY): Payer: Medicare Other

## 2016-12-06 ENCOUNTER — Ambulatory Visit: Payer: Medicare Other | Admitting: Vascular Surgery

## 2016-12-23 ENCOUNTER — Encounter: Payer: Self-pay | Admitting: Vascular Surgery

## 2016-12-30 ENCOUNTER — Other Ambulatory Visit: Payer: Self-pay | Admitting: Vascular Surgery

## 2016-12-30 DIAGNOSIS — I739 Peripheral vascular disease, unspecified: Principal | ICD-10-CM

## 2016-12-30 DIAGNOSIS — I70219 Atherosclerosis of native arteries of extremities with intermittent claudication, unspecified extremity: Secondary | ICD-10-CM

## 2016-12-30 DIAGNOSIS — I779 Disorder of arteries and arterioles, unspecified: Secondary | ICD-10-CM

## 2016-12-30 NOTE — Progress Notes (Signed)
Established Patient Visit   History of Present Illness  Adam Mcclain is a 66 y.o. (29-Mar-1951) male  who presents with cc: leg feels better.  The patient's prior procedures include:  1. 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 R CEA with BPA for R asx carotid stenosis>80% (06/26/16) 3. R CIA PTA+S for intermittent claudication (03/25/13)  The patient notes improvement in in leg sx.  The patient's current symptoms are: mild intermittent claudication.  The patient is able to complete their activities of daily living.    Past Medical History, Past Surgical History, Social History, Family History, Medications, Allergies, and Review of Systems are unchanged from previous evaluation on 08/30/16.  Current Outpatient Prescriptions  Medication Sig Dispense Refill  . ALPRAZolam (XANAX) 0.5 MG tablet Take 0.5 mg by mouth daily.    Marland Kitchen aspirin 81 MG tablet Take 81 mg by mouth daily.    Marland Kitchen atorvastatin (LIPITOR) 40 MG tablet Take 40 mg by mouth daily.     Marland Kitchen buPROPion (WELLBUTRIN SR) 150 MG 12 hr tablet Take 150 mg by mouth 2 (two) times daily.    . citalopram (CELEXA) 20 MG tablet Take 20 mg by mouth daily.     . clopidogrel (PLAVIX) 75 MG tablet Take 1 tablet (75 mg total) by mouth daily. 30 tablet 11  . etodolac (LODINE) 400 MG tablet Take 400 mg by mouth 2 (two) times daily.    Marland Kitchen lisinopril-hydrochlorothiazide (PRINZIDE,ZESTORETIC) 20-12.5 MG per tablet Take 1 tablet by mouth daily.     Marland Kitchen lovastatin (MEVACOR) 20 MG tablet Take 20 mg by mouth at bedtime.    . Multiple Vitamin (MULTIVITAMIN WITH MINERALS) TABS Take 1 tablet by mouth daily.    . traMADol (ULTRAM) 50 MG tablet Take 1 tablet (50 mg total) by mouth 3 (three) times daily as needed for moderate pain. 8 tablet 0  . traZODone (DESYREL) 50 MG tablet Take 50 mg by mouth at bedtime.     No current facility-administered medications for this visit.     For VQI Use Only  PRE-ADM LIVING: Home  AMB STATUS:  Ambulatory   Physical Examination  There were no vitals filed for this visit.  There is no height or weight on file to calculate BMI.  General: Alert, O x 3, WD,NAD  Pulmonary: Sym exp, good B air movt,CTA B  Cardiac: RRR, Nl S1, S2, no Murmurs, No rubs, No S3,S4  Vascular: Vessel Right Left  Radial Palpable Palpable  Brachial Palpable Palpable  Carotid Palpable, No Bruit Palpable, No Bruit  Aorta Not palpable N/A  Femoral Palpable Palpable  Popliteal Not palpable Not palpable  PT Not palpable Not palpable  DP Not palpable Not palpable   Gastrointestinal: soft, non-distended, non-tender to palpation, No guarding or rebound, no HSM, no masses, no CVAT B, No palpable prominent aortic pulse,    Musculoskeletal: M/S 5/5 throughout  , Extremities without ischemic changes  , No edema present,     Neurologic: Pain and light touch intact in extremities  , Motor exam as listed above  ABI (Date: 01/03/2017 )  R:   ABI: 0.77 (0.67),   DP: none  PT: mono  TBI:  0.37  L:   ABI: 0.75 (0.77),   DP: none  PT: bi  TBI: 0.35  R Aortoiliac duplex (01/03/2017 )  Ao: 85 c/s  In-stent velocities: 86-191 c/s    Medical Decision Making  Adam Mcclain is a 66 y.o. (20-Jan-1951) male  who  presents s/p R CIA DCB PTA for in-stent restenosis of R CIA PTA+S, s/p R CEA   Based on his angiographic findings, this patient needs: q3 month ABI, Aortoiliac duplex.  Pt's B carotid duplex is already schedule for July 2018.  Will line up the two sets of surveillance next time.  I discussed in depth with the patient the nature of atherosclerosis, and emphasized the importance of maximal medical management including strict control of blood pressure, blood glucose, and lipid levels, obtaining regular exercise, and cessation of smoking.  The patient is aware that without maximal medical management the underlying atherosclerotic disease process will progress, limiting the benefit  of any interventions.  The patient is currently on a statin:  Lipitor.  The patient is currently on an anti-platelet: ASA.  Thank you for allowing [place] to participate in this patient's care.   Leonides Sake, MD, FACS [facility] Office: [phone] Pager: [phone]

## 2017-01-03 ENCOUNTER — Ambulatory Visit (INDEPENDENT_AMBULATORY_CARE_PROVIDER_SITE_OTHER): Payer: Medicare Other | Admitting: Vascular Surgery

## 2017-01-03 ENCOUNTER — Ambulatory Visit (INDEPENDENT_AMBULATORY_CARE_PROVIDER_SITE_OTHER)
Admission: RE | Admit: 2017-01-03 | Discharge: 2017-01-03 | Disposition: A | Discharge status: Home / Self Care | Payer: Medicare Other | Source: Ambulatory Visit | Attending: Vascular Surgery | Admitting: Vascular Surgery

## 2017-01-03 ENCOUNTER — Encounter: Payer: Self-pay | Admitting: Vascular Surgery

## 2017-01-03 ENCOUNTER — Ambulatory Visit (HOSPITAL_COMMUNITY)
Admission: RE | Admit: 2017-01-03 | Discharge: 2017-01-03 | Disposition: A | Discharge status: Home / Self Care | Payer: Medicare Other | Source: Ambulatory Visit | Attending: Vascular Surgery | Admitting: Vascular Surgery

## 2017-01-03 VITALS — BP 137/96 | HR 68 | Temp 97.1°F | Resp 20 | Ht 66.0 in | Wt 137.0 lb

## 2017-01-03 DIAGNOSIS — I70219 Atherosclerosis of native arteries of extremities with intermittent claudication, unspecified extremity: Secondary | ICD-10-CM | POA: Diagnosis not present

## 2017-01-03 DIAGNOSIS — I779 Disorder of arteries and arterioles, unspecified: Secondary | ICD-10-CM

## 2017-01-03 DIAGNOSIS — I739 Peripheral vascular disease, unspecified: Secondary | ICD-10-CM

## 2017-01-03 DIAGNOSIS — I70213 Atherosclerosis of native arteries of extremities with intermittent claudication, bilateral legs: Secondary | ICD-10-CM

## 2017-01-16 DIAGNOSIS — E782 Mixed hyperlipidemia: Secondary | ICD-10-CM | POA: Diagnosis not present

## 2017-01-16 DIAGNOSIS — I10 Essential (primary) hypertension: Secondary | ICD-10-CM | POA: Diagnosis not present

## 2017-01-20 DIAGNOSIS — N183 Chronic kidney disease, stage 3 (moderate): Secondary | ICD-10-CM | POA: Diagnosis not present

## 2017-01-20 DIAGNOSIS — Z6821 Body mass index (BMI) 21.0-21.9, adult: Secondary | ICD-10-CM | POA: Diagnosis not present

## 2017-01-20 DIAGNOSIS — I10 Essential (primary) hypertension: Secondary | ICD-10-CM | POA: Diagnosis not present

## 2017-01-20 DIAGNOSIS — J449 Chronic obstructive pulmonary disease, unspecified: Secondary | ICD-10-CM | POA: Diagnosis not present

## 2017-01-20 DIAGNOSIS — F17210 Nicotine dependence, cigarettes, uncomplicated: Secondary | ICD-10-CM | POA: Diagnosis not present

## 2017-01-20 DIAGNOSIS — F331 Major depressive disorder, recurrent, moderate: Secondary | ICD-10-CM | POA: Diagnosis not present

## 2017-01-20 DIAGNOSIS — J301 Allergic rhinitis due to pollen: Secondary | ICD-10-CM | POA: Diagnosis not present

## 2017-01-20 DIAGNOSIS — E782 Mixed hyperlipidemia: Secondary | ICD-10-CM | POA: Diagnosis not present

## 2017-04-25 NOTE — Addendum Note (Signed)
Addended by: Burton ApleyPETTY, Michoel Kunin A on: 04/25/2017 04:07 PM   Modules accepted: Orders

## 2017-04-29 ENCOUNTER — Encounter: Payer: Self-pay | Admitting: Vascular Surgery

## 2017-05-02 NOTE — Progress Notes (Deleted)
Established Intermittent Claudication   History of Present Illness   Adam Mcclain is a 66 y.o. (1951/06/10) male who presents with chief complaint: ***.    The patient's prior procedures include:  1. 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 R CEA with BPA for R asx carotid stenosis>80% (06/26/16) 3. R CIA PTA+S for intermittent claudication (03/25/13)  The patient's symptoms have *** progressed.  The patient's symptoms are: ***.  The patient's treatment regimen currently included: maximal medical management and ***walking plan.  The patient's PMH, PSH, and SH, and FamHx are unchanged from 01/03/17.  Current Outpatient Prescriptions  Medication Sig Dispense Refill  . ALPRAZolam (XANAX) 0.5 MG tablet Take 0.5 mg by mouth daily.    Marland Kitchen aspirin 81 MG tablet Take 81 mg by mouth daily.    Marland Kitchen atorvastatin (LIPITOR) 40 MG tablet Take 40 mg by mouth daily.     Marland Kitchen buPROPion (WELLBUTRIN SR) 150 MG 12 hr tablet Take 150 mg by mouth 2 (two) times daily.    . citalopram (CELEXA) 20 MG tablet Take 20 mg by mouth daily.     . clopidogrel (PLAVIX) 75 MG tablet Take 1 tablet (75 mg total) by mouth daily. 30 tablet 11  . etodolac (LODINE) 400 MG tablet Take 400 mg by mouth 2 (two) times daily.    Marland Kitchen lisinopril-hydrochlorothiazide (PRINZIDE,ZESTORETIC) 20-12.5 MG per tablet Take 1 tablet by mouth daily.     Marland Kitchen lovastatin (MEVACOR) 20 MG tablet Take 20 mg by mouth at bedtime.    . Multiple Vitamin (MULTIVITAMIN WITH MINERALS) TABS Take 1 tablet by mouth daily.    . traMADol (ULTRAM) 50 MG tablet Take 1 tablet (50 mg total) by mouth 3 (three) times daily as needed for moderate pain. 8 tablet 0  . traZODone (DESYREL) 50 MG tablet Take 50 mg by mouth at bedtime.     No current facility-administered medications for this visit.     On ROS today: ***, ***.   Physical Examination  ***There were no vitals filed for this visit. ***There is no height or weight on file to calculate BMI.  General  {LOC:19197::"Somulent","Alert"}, {Orientation:19197::"Confused","O x 3"}, {Weight:19197::"Obese","Cachectic","WD"}, {General state of health:19197::"Ill appearing","Elderly","NAD"}  Pulmonary {Chest wall:19197::"Asx chest movement","Sym exp"}, {Air movt:19197::"Decreased *** air movt","good B air movt"}, {BS:19197::"rales on ***","rhonchi on ***","wheezing on ***","CTA B"}  Cardiac {Rhythm:19197::"Irregularly, irregular rate and rhythm","RRR, Nl S1, S2"}, {Murmur:19197::"Murmur present: ***","no Murmurs"}, {Rubs:19197::"Rub present: ***","No rubs"}, {Gallop:19197::"Gallop present: ***","No S3,S4"}  Vascular Vessel Right Left  Radial {Palpable:19197::"Not palpable","Faintly palpable","Palpable"} {Palpable:19197::"Not palpable","Faintly palpable","Palpable"}  Brachial {Palpable:19197::"Not palpable","Faintly palpable","Palpable"} {Palpable:19197::"Not palpable","Faintly palpable","Palpable"}  Carotid Palpable, {Bruit:19197::"Bruit present","No Bruit"} Palpable, {Bruit:19197::"Bruit present","No Bruit"}  Aorta Not palpable N/A  Femoral {Palpable:19197::"Not palpable","Faintly palpable","Palpable"} {Palpable:19197::"Not palpable","Faintly palpable","Palpable"}  Popliteal {Palpable:19197::"Prominently palpable","Not palpable"} {Palpable:19197::"Prominently palpable","Not palpable"}  PT {Palpable:19197::"Not palpable","Faintly palpable","Palpable"} {Palpable:19197::"Not palpable","Faintly palpable","Palpable"}  DP {Palpable:19197::"Not palpable","Faintly palpable","Palpable"} {Palpable:19197::"Not palpable","Faintly palpable","Palpable"}    Gastro- intestinal soft, {Distension:19197::"distended","non-distended"}, {TTP:19197::"TTP in *** quadrant","appropriate tenderness to palpation","non-tender to palpation"}, {Guarding:19197::"Guarding and rebound in *** quadrant","No guarding or rebound"}, {HSM:19197::"HSM present","no HSM"}, {Masses:19197::"Mass present: ***","no masses"}, {Flank:19197::"CVAT on  ***","Flank bruit present on ***","no CVAT B"}, {AAA:19197::"Palpable prominent aortic pulse","No palpable prominent aortic pulse"}, {Surgical incision:19197::"Surg incisions: ***","Surgical incisions well healed"," "}  Musculo- skeletal M/S 5/5 throughout {MS:19197::"except ***"," "}, Extremities without ischemic changes {MS:19197::"except ***"," "}, {Edema:19197::"Pitting edema present: ***","Non-pitting edema present: ***","No edema present"}, {Varicosities:19197::"Varicosities present: ***","No visible varicosities "}, {LDS:19197::"Lipodermatosclerosis present: ***","No Lipodermatosclerosis present"}  Neurologic Pain and light touch intact in extremities{CN:19197::" except for decreased sensation in ***"," "},  Motor exam as listed above    Non-Invasive Vascular imaging   ABI (***)  R:   ABI: *** (***),   PT: {Signals:19197::"none","mono","bi","tri"}  DP: {Signals:19197::"none","mono","bi","tri"}  TBI:  ***  L:   ABI: *** (***),   PT: {Signals:19197::"none","mono","bi","tri"}  DP: {Signals:19197::"none","mono","bi","tri"}  TBI: ***  Aortoiliac Duplex (***)  Ao: *** c/s  R iliac: *** c/s  L iliac: *** c/s   Medical Decision Making   Adam Mcclain is a 66 y.o. male who presents with:  s/p R CIA DCB PTA for in-stent restenosis of R CIA PTA+S, s/p R CEA   Based on the patient's vascular studies and examination, I have offered the patient: q3 month ABI, Aortoiliac duplex.  I discussed in depth with the patient the nature of atherosclerosis, and emphasized the importance of maximal medical management including strict control of blood pressure, blood glucose, and lipid levels, antiplatelet agents, obtaining regular exercise, and cessation of smoking.    The patient is aware that without maximal medical management the underlying atherosclerotic disease process will progress, limiting the benefit of any interventions. The patient is currently on a statin: Lipitor.  The  patient is currently on an anti-platelet: ASA.  Thank you for allowing [place] to participate in this patient's care.   Leonides Sake, MD, FACS [facility] [place] Office: [phone] Pager: [phone]

## 2017-05-09 ENCOUNTER — Encounter (HOSPITAL_COMMUNITY): Payer: Medicare Other

## 2017-05-09 ENCOUNTER — Ambulatory Visit: Payer: Medicare Other | Admitting: Vascular Surgery

## 2017-05-14 DIAGNOSIS — Z008 Encounter for other general examination: Secondary | ICD-10-CM | POA: Diagnosis not present

## 2017-05-14 DIAGNOSIS — M545 Low back pain: Secondary | ICD-10-CM | POA: Diagnosis not present

## 2017-05-14 DIAGNOSIS — M25561 Pain in right knee: Secondary | ICD-10-CM | POA: Diagnosis not present

## 2017-06-06 DIAGNOSIS — E782 Mixed hyperlipidemia: Secondary | ICD-10-CM | POA: Diagnosis not present

## 2017-06-06 DIAGNOSIS — N183 Chronic kidney disease, stage 3 (moderate): Secondary | ICD-10-CM | POA: Diagnosis not present

## 2017-06-06 DIAGNOSIS — I739 Peripheral vascular disease, unspecified: Secondary | ICD-10-CM | POA: Diagnosis not present

## 2017-06-11 DIAGNOSIS — Z23 Encounter for immunization: Secondary | ICD-10-CM | POA: Diagnosis not present

## 2017-06-11 DIAGNOSIS — Z1212 Encounter for screening for malignant neoplasm of rectum: Secondary | ICD-10-CM | POA: Diagnosis not present

## 2017-06-11 DIAGNOSIS — Z0001 Encounter for general adult medical examination with abnormal findings: Secondary | ICD-10-CM | POA: Diagnosis not present

## 2017-06-11 DIAGNOSIS — N183 Chronic kidney disease, stage 3 (moderate): Secondary | ICD-10-CM | POA: Diagnosis not present

## 2017-06-11 DIAGNOSIS — E782 Mixed hyperlipidemia: Secondary | ICD-10-CM | POA: Diagnosis not present

## 2017-06-11 DIAGNOSIS — I10 Essential (primary) hypertension: Secondary | ICD-10-CM | POA: Diagnosis not present

## 2017-06-24 DIAGNOSIS — Z1212 Encounter for screening for malignant neoplasm of rectum: Secondary | ICD-10-CM | POA: Diagnosis not present

## 2017-06-24 DIAGNOSIS — Z1211 Encounter for screening for malignant neoplasm of colon: Secondary | ICD-10-CM | POA: Diagnosis not present

## 2017-06-30 NOTE — Progress Notes (Signed)
Established Carotid and Intermittent Claudication Patient   History of Present Illness   Adam Mcclain is a 66 y.o. (12-Apr-1951) male who presents with chief complaint: none.  The patient's prior procedures include:  1. 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 R CEA with BPA for R asx carotid stenosis >80% (06/26/16) 3. R CIA PTA+S for intermittent claudication (03/25/13)  Previous carotid studies demonstrated: RICA 80-99% stenosis, LICA 40-59% stenosis.  Patient has no history of TIA or stroke symptom.  The patient has never had amaurosis fugax or monocular blindness.  The patient has never had facial drooping or hemiplegia.  The patient has never had receptive or expressive aphasia.    The patient's symptoms have not progressed.  The patient's symptoms are: occasional intermittent claudication.  Pt is mostly sedentary.  The patient's treatment regimen currently included: maximal medical management.  Pt continues to smoke.  The patient's PMH, PSH, SH, and FamHx are unchanged from 01/03/17.  Current Outpatient Prescriptions  Medication Sig Dispense Refill  . ALPRAZolam (XANAX) 0.5 MG tablet Take 0.5 mg by mouth daily.    Marland Kitchen aspirin 81 MG tablet Take 81 mg by mouth daily.    Marland Kitchen atorvastatin (LIPITOR) 40 MG tablet Take 40 mg by mouth daily.     Marland Kitchen buPROPion (WELLBUTRIN SR) 150 MG 12 hr tablet Take 150 mg by mouth 2 (two) times daily.    . citalopram (CELEXA) 20 MG tablet Take 20 mg by mouth daily.     . clopidogrel (PLAVIX) 75 MG tablet Take 1 tablet (75 mg total) by mouth daily. 30 tablet 11  . etodolac (LODINE) 400 MG tablet Take 400 mg by mouth 2 (two) times daily.    Marland Kitchen lisinopril-hydrochlorothiazide (PRINZIDE,ZESTORETIC) 20-12.5 MG per tablet Take 1 tablet by mouth daily.     Marland Kitchen lovastatin (MEVACOR) 20 MG tablet Take 20 mg by mouth at bedtime.    . Multiple Vitamin (MULTIVITAMIN WITH MINERALS) TABS Take 1 tablet by mouth daily.    . traMADol (ULTRAM) 50 MG tablet Take 1  tablet (50 mg total) by mouth 3 (three) times daily as needed for moderate pain. 8 tablet 0  . traZODone (DESYREL) 50 MG tablet Take 50 mg by mouth at bedtime.     No current facility-administered medications for this visit.     On ROS today: intermittent claudication , no rest pain, no wounds   Physical Examination   Vitals:   07/04/17 0927 07/04/17 0930  BP: (!) 195/97 (!) 186/93  Pulse: (!) 52   Resp: 20   Temp: (!) 97 F (36.1 C)   TempSrc: Oral   SpO2: 98%   Weight: 137 lb (62.1 kg)   Height:  (1.676 m)    Body mass index is 22.11 kg/m.  General Alert, O x 3, WD, Ill appearing , smell of cigarettes  Neck Supple, mid-line trachea,    Pulmonary Sym exp, good B air movt, CTA B  Cardiac RRR, Nl S1, S2, no Murmurs, No rubs, No S3,S4  Vascular Vessel Right Left  Radial Palpable Palpable  Brachial Palpable Palpable  Carotid Palpable, No Bruit Palpable, No Bruit  Aorta Not palpable N/A  Femoral Palpable Palpable  Popliteal Not palpable Not palpable  PT Not palpable Not palpable  DP Faintly palpable Faintly palpable    Gastro- intestinal soft, non-distended, non-tender to palpation, No guarding or rebound, no HSM, no masses, no CVAT B, No palpable prominent aortic pulse,    Musculo- skeletal M/S  5/5 throughout  , Extremities without ischemic changes    Neurologic Cranial nerves 2-12 intact , Pain and light touch intact in extremities , Motor exam as listed above    Non-invasive Vascular Imaging   ABI (07/04/2017)  R:   ABI: 0.58 (0.77),   PT: mono  DP: none  TBI:  0.49  L:   ABI: 0.75 (0.75),   PT: mono  DP: none  TBI: 0.45  Aortoiliac duplex (07/04/2017)  Patent R CIA without significant stenosis    Medical Decision Making   Adam Mcclain is a 66 y.o. male who presents with:  s/p R CIA DCB PTA for in-stent restenosis of R CIA PTA+S, s/p R CEA for R asx carotid stenosis >80%   Based on the patient's vascular studies and examination, I  have offered the patient: BLE ABI, aortoiliac duplex and B carotid duplex in 6 months.  I discussed in depth with the patient the nature of atherosclerosis, and emphasized the importance of maximal medical management including strict control of blood pressure, blood glucose, and lipid levels, antiplatelet agents, obtaining regular exercise, and cessation of smoking.    The patient is aware that without maximal medical management the underlying atherosclerotic disease process will progress, limiting the benefit of any interventions. The patient is currently on a statin: Lipitor.  The patient is currently on an anti-platelet: ASA.  Thank you for allowing [place] to participate in this patient's care.   Leonides Sake, MD, FACS [facility] [place] Office: [phone] Pager: [phone]

## 2017-07-04 ENCOUNTER — Encounter: Payer: Self-pay | Admitting: Vascular Surgery

## 2017-07-04 ENCOUNTER — Ambulatory Visit (INDEPENDENT_AMBULATORY_CARE_PROVIDER_SITE_OTHER)
Admission: RE | Admit: 2017-07-04 | Discharge: 2017-07-04 | Disposition: A | Discharge status: Home / Self Care | Payer: Medicare Other | Source: Ambulatory Visit | Attending: Vascular Surgery | Admitting: Vascular Surgery

## 2017-07-04 ENCOUNTER — Ambulatory Visit (HOSPITAL_COMMUNITY)
Admission: RE | Admit: 2017-07-04 | Discharge: 2017-07-04 | Disposition: A | Discharge status: Home / Self Care | Payer: Medicare Other | Source: Ambulatory Visit | Attending: Vascular Surgery | Admitting: Vascular Surgery

## 2017-07-04 ENCOUNTER — Ambulatory Visit (INDEPENDENT_AMBULATORY_CARE_PROVIDER_SITE_OTHER): Payer: Medicare Other | Admitting: Vascular Surgery

## 2017-07-04 VITALS — BP 186/93 | HR 52 | Temp 97.0°F | Resp 20 | Ht 66.0 in | Wt 137.0 lb

## 2017-07-04 DIAGNOSIS — I6529 Occlusion and stenosis of unspecified carotid artery: Secondary | ICD-10-CM | POA: Diagnosis not present

## 2017-07-04 DIAGNOSIS — I70213 Atherosclerosis of native arteries of extremities with intermittent claudication, bilateral legs: Secondary | ICD-10-CM

## 2017-07-04 DIAGNOSIS — R9439 Abnormal result of other cardiovascular function study: Secondary | ICD-10-CM | POA: Diagnosis not present

## 2017-07-04 DIAGNOSIS — Z95820 Peripheral vascular angioplasty status with implants and grafts: Secondary | ICD-10-CM | POA: Insufficient documentation

## 2017-07-22 NOTE — Addendum Note (Signed)
Addended by: Burton ApleyPETTY, Brigida Scotti A on: 07/22/2017 03:56 PM   Modules accepted: Orders

## 2017-09-04 DIAGNOSIS — R195 Other fecal abnormalities: Secondary | ICD-10-CM | POA: Diagnosis not present

## 2017-10-01 DIAGNOSIS — Z7902 Long term (current) use of antithrombotics/antiplatelets: Secondary | ICD-10-CM | POA: Diagnosis not present

## 2017-10-01 DIAGNOSIS — R195 Other fecal abnormalities: Secondary | ICD-10-CM | POA: Diagnosis not present

## 2017-10-01 DIAGNOSIS — I739 Peripheral vascular disease, unspecified: Secondary | ICD-10-CM | POA: Diagnosis not present

## 2017-10-01 DIAGNOSIS — N289 Disorder of kidney and ureter, unspecified: Secondary | ICD-10-CM | POA: Diagnosis not present

## 2017-10-01 DIAGNOSIS — Z7982 Long term (current) use of aspirin: Secondary | ICD-10-CM | POA: Diagnosis not present

## 2017-10-01 DIAGNOSIS — J449 Chronic obstructive pulmonary disease, unspecified: Secondary | ICD-10-CM | POA: Diagnosis not present

## 2017-10-01 DIAGNOSIS — E785 Hyperlipidemia, unspecified: Secondary | ICD-10-CM | POA: Diagnosis not present

## 2017-10-01 DIAGNOSIS — Z79899 Other long term (current) drug therapy: Secondary | ICD-10-CM | POA: Diagnosis not present

## 2017-10-07 DIAGNOSIS — Z9189 Other specified personal risk factors, not elsewhere classified: Secondary | ICD-10-CM | POA: Diagnosis not present

## 2017-10-07 DIAGNOSIS — E782 Mixed hyperlipidemia: Secondary | ICD-10-CM | POA: Diagnosis not present

## 2017-10-07 DIAGNOSIS — I10 Essential (primary) hypertension: Secondary | ICD-10-CM | POA: Diagnosis not present

## 2017-10-07 DIAGNOSIS — N183 Chronic kidney disease, stage 3 (moderate): Secondary | ICD-10-CM | POA: Diagnosis not present

## 2017-10-10 DIAGNOSIS — I10 Essential (primary) hypertension: Secondary | ICD-10-CM | POA: Diagnosis not present

## 2017-10-10 DIAGNOSIS — Z0001 Encounter for general adult medical examination with abnormal findings: Secondary | ICD-10-CM | POA: Diagnosis not present

## 2017-10-10 DIAGNOSIS — E782 Mixed hyperlipidemia: Secondary | ICD-10-CM | POA: Diagnosis not present

## 2017-10-21 DIAGNOSIS — R195 Other fecal abnormalities: Secondary | ICD-10-CM | POA: Diagnosis not present

## 2018-01-12 ENCOUNTER — Encounter (HOSPITAL_COMMUNITY): Payer: Medicare Other

## 2018-01-12 ENCOUNTER — Inpatient Hospital Stay (HOSPITAL_COMMUNITY): Admission: RE | Admit: 2018-01-12 | Payer: Medicare Other | Source: Ambulatory Visit

## 2018-01-12 ENCOUNTER — Ambulatory Visit: Payer: Medicare Other | Admitting: Family

## 2018-02-10 DIAGNOSIS — I10 Essential (primary) hypertension: Secondary | ICD-10-CM | POA: Diagnosis not present

## 2018-02-10 DIAGNOSIS — E782 Mixed hyperlipidemia: Secondary | ICD-10-CM | POA: Diagnosis not present

## 2018-02-10 DIAGNOSIS — J301 Allergic rhinitis due to pollen: Secondary | ICD-10-CM | POA: Diagnosis not present

## 2018-06-03 DIAGNOSIS — N183 Chronic kidney disease, stage 3 (moderate): Secondary | ICD-10-CM | POA: Diagnosis not present

## 2018-06-03 DIAGNOSIS — I10 Essential (primary) hypertension: Secondary | ICD-10-CM | POA: Diagnosis not present

## 2018-06-03 DIAGNOSIS — E782 Mixed hyperlipidemia: Secondary | ICD-10-CM | POA: Diagnosis not present

## 2018-06-10 DIAGNOSIS — E782 Mixed hyperlipidemia: Secondary | ICD-10-CM | POA: Diagnosis not present

## 2018-06-10 DIAGNOSIS — J449 Chronic obstructive pulmonary disease, unspecified: Secondary | ICD-10-CM | POA: Diagnosis not present

## 2018-06-10 DIAGNOSIS — I10 Essential (primary) hypertension: Secondary | ICD-10-CM | POA: Diagnosis not present

## 2018-06-17 DIAGNOSIS — J449 Chronic obstructive pulmonary disease, unspecified: Secondary | ICD-10-CM | POA: Diagnosis not present

## 2018-06-17 DIAGNOSIS — N183 Chronic kidney disease, stage 3 (moderate): Secondary | ICD-10-CM | POA: Diagnosis not present

## 2018-06-17 DIAGNOSIS — Z23 Encounter for immunization: Secondary | ICD-10-CM | POA: Diagnosis not present

## 2018-06-17 DIAGNOSIS — Z9189 Other specified personal risk factors, not elsewhere classified: Secondary | ICD-10-CM | POA: Diagnosis not present

## 2018-10-08 DIAGNOSIS — I10 Essential (primary) hypertension: Secondary | ICD-10-CM | POA: Diagnosis not present

## 2018-10-08 DIAGNOSIS — I739 Peripheral vascular disease, unspecified: Secondary | ICD-10-CM | POA: Diagnosis not present

## 2018-10-08 DIAGNOSIS — E782 Mixed hyperlipidemia: Secondary | ICD-10-CM | POA: Diagnosis not present

## 2018-10-13 DIAGNOSIS — I10 Essential (primary) hypertension: Secondary | ICD-10-CM | POA: Diagnosis not present

## 2018-10-13 DIAGNOSIS — Z6820 Body mass index (BMI) 20.0-20.9, adult: Secondary | ICD-10-CM | POA: Diagnosis not present

## 2018-10-13 DIAGNOSIS — Z0001 Encounter for general adult medical examination with abnormal findings: Secondary | ICD-10-CM | POA: Diagnosis not present

## 2019-01-14 ENCOUNTER — Other Ambulatory Visit: Payer: Self-pay

## 2019-01-14 NOTE — Patient Outreach (Signed)
[facility] Customer service manager [facility]) Care Management  01/14/2019  PAT Adam Mcclain 1950/11/26 [id number]   Medication Adherence call to [name] spoke with patient he is due on Atorvastatin 40 mg and Lisinopril/Hctz 20/12.5 mg patient explain he is still taking 1 tablet daily on both medications and is due and has not received from [facility] he ask if we can call [facility] and order both medication patient will receive both medications with in 5-7 days patient will wait until he received from [facility]. Mr. Chadbourne is showing past due under [employer].   Lillia Abed CPhT Pharmacy Technician [facility] Care Management Direct Dial [phone]  Fax [phone] [email or web address]

## 2019-01-17 DIAGNOSIS — S72141D Displaced intertrochanteric fracture of right femur, subsequent encounter for closed fracture with routine healing: Secondary | ICD-10-CM | POA: Diagnosis not present

## 2019-01-17 DIAGNOSIS — I10 Essential (primary) hypertension: Secondary | ICD-10-CM | POA: Diagnosis not present

## 2019-01-17 DIAGNOSIS — J449 Chronic obstructive pulmonary disease, unspecified: Secondary | ICD-10-CM | POA: Diagnosis not present

## 2019-01-17 DIAGNOSIS — Z79899 Other long term (current) drug therapy: Secondary | ICD-10-CM | POA: Diagnosis not present

## 2019-01-17 DIAGNOSIS — S42142A Displaced fracture of glenoid cavity of scapula, left shoulder, initial encounter for closed fracture: Secondary | ICD-10-CM | POA: Diagnosis not present

## 2019-01-17 DIAGNOSIS — I2510 Atherosclerotic heart disease of native coronary artery without angina pectoris: Secondary | ICD-10-CM | POA: Diagnosis not present

## 2019-01-17 DIAGNOSIS — S72001A Fracture of unspecified part of neck of right femur, initial encounter for closed fracture: Secondary | ICD-10-CM | POA: Diagnosis not present

## 2019-01-17 DIAGNOSIS — R0689 Other abnormalities of breathing: Secondary | ICD-10-CM | POA: Diagnosis not present

## 2019-01-17 DIAGNOSIS — S72142A Displaced intertrochanteric fracture of left femur, initial encounter for closed fracture: Secondary | ICD-10-CM | POA: Diagnosis not present

## 2019-01-17 DIAGNOSIS — R52 Pain, unspecified: Secondary | ICD-10-CM | POA: Diagnosis not present

## 2019-01-17 DIAGNOSIS — N183 Chronic kidney disease, stage 3 (moderate): Secondary | ICD-10-CM | POA: Diagnosis not present

## 2019-01-17 DIAGNOSIS — N189 Chronic kidney disease, unspecified: Secondary | ICD-10-CM | POA: Diagnosis not present

## 2019-01-17 DIAGNOSIS — M79604 Pain in right leg: Secondary | ICD-10-CM | POA: Diagnosis not present

## 2019-01-17 DIAGNOSIS — M80051A Age-related osteoporosis with current pathological fracture, right femur, initial encounter for fracture: Secondary | ICD-10-CM | POA: Diagnosis not present

## 2019-01-17 DIAGNOSIS — W19XXXA Unspecified fall, initial encounter: Secondary | ICD-10-CM | POA: Diagnosis not present

## 2019-01-17 DIAGNOSIS — Z7902 Long term (current) use of antithrombotics/antiplatelets: Secondary | ICD-10-CM | POA: Diagnosis not present

## 2019-01-17 DIAGNOSIS — D62 Acute posthemorrhagic anemia: Secondary | ICD-10-CM | POA: Diagnosis not present

## 2019-01-17 DIAGNOSIS — W19XXXD Unspecified fall, subsequent encounter: Secondary | ICD-10-CM | POA: Diagnosis not present

## 2019-01-17 DIAGNOSIS — I129 Hypertensive chronic kidney disease with stage 1 through stage 4 chronic kidney disease, or unspecified chronic kidney disease: Secondary | ICD-10-CM | POA: Diagnosis not present

## 2019-01-17 DIAGNOSIS — I739 Peripheral vascular disease, unspecified: Secondary | ICD-10-CM | POA: Diagnosis not present

## 2019-01-17 DIAGNOSIS — S59902A Unspecified injury of left elbow, initial encounter: Secondary | ICD-10-CM | POA: Diagnosis not present

## 2019-01-17 DIAGNOSIS — R279 Unspecified lack of coordination: Secondary | ICD-10-CM | POA: Diagnosis not present

## 2019-01-17 DIAGNOSIS — S72101A Unspecified trochanteric fracture of right femur, initial encounter for closed fracture: Secondary | ICD-10-CM | POA: Diagnosis not present

## 2019-01-17 DIAGNOSIS — Z7982 Long term (current) use of aspirin: Secondary | ICD-10-CM | POA: Diagnosis not present

## 2019-01-17 DIAGNOSIS — N182 Chronic kidney disease, stage 2 (mild): Secondary | ICD-10-CM | POA: Diagnosis not present

## 2019-01-17 DIAGNOSIS — M25551 Pain in right hip: Secondary | ICD-10-CM | POA: Diagnosis not present

## 2019-01-17 DIAGNOSIS — S50312A Abrasion of left elbow, initial encounter: Secondary | ICD-10-CM | POA: Diagnosis not present

## 2019-01-17 DIAGNOSIS — Z95820 Peripheral vascular angioplasty status with implants and grafts: Secondary | ICD-10-CM | POA: Diagnosis not present

## 2019-01-17 DIAGNOSIS — I252 Old myocardial infarction: Secondary | ICD-10-CM | POA: Diagnosis not present

## 2019-01-17 DIAGNOSIS — E7800 Pure hypercholesterolemia, unspecified: Secondary | ICD-10-CM | POA: Diagnosis not present

## 2019-01-17 DIAGNOSIS — S299XXA Unspecified injury of thorax, initial encounter: Secondary | ICD-10-CM | POA: Diagnosis not present

## 2019-01-17 DIAGNOSIS — M25461 Effusion, right knee: Secondary | ICD-10-CM | POA: Diagnosis not present

## 2019-01-17 DIAGNOSIS — S72141A Displaced intertrochanteric fracture of right femur, initial encounter for closed fracture: Secondary | ICD-10-CM | POA: Diagnosis not present

## 2019-01-17 DIAGNOSIS — R2689 Other abnormalities of gait and mobility: Secondary | ICD-10-CM | POA: Diagnosis not present

## 2019-01-17 DIAGNOSIS — S72009A Fracture of unspecified part of neck of unspecified femur, initial encounter for closed fracture: Secondary | ICD-10-CM | POA: Diagnosis not present

## 2019-01-17 DIAGNOSIS — M25522 Pain in left elbow: Secondary | ICD-10-CM | POA: Diagnosis not present

## 2019-01-17 DIAGNOSIS — M6282 Rhabdomyolysis: Secondary | ICD-10-CM | POA: Diagnosis not present

## 2019-01-17 DIAGNOSIS — M6281 Muscle weakness (generalized): Secondary | ICD-10-CM | POA: Diagnosis not present

## 2019-01-17 DIAGNOSIS — S199XXA Unspecified injury of neck, initial encounter: Secondary | ICD-10-CM | POA: Diagnosis not present

## 2019-01-17 DIAGNOSIS — Z66 Do not resuscitate: Secondary | ICD-10-CM | POA: Diagnosis not present

## 2019-01-17 DIAGNOSIS — S0990XA Unspecified injury of head, initial encounter: Secondary | ICD-10-CM | POA: Diagnosis not present

## 2019-01-17 DIAGNOSIS — I70219 Atherosclerosis of native arteries of extremities with intermittent claudication, unspecified extremity: Secondary | ICD-10-CM | POA: Diagnosis not present

## 2019-01-17 DIAGNOSIS — S5002XA Contusion of left elbow, initial encounter: Secondary | ICD-10-CM | POA: Diagnosis not present

## 2019-01-17 DIAGNOSIS — R5381 Other malaise: Secondary | ICD-10-CM | POA: Diagnosis not present

## 2019-01-17 DIAGNOSIS — E785 Hyperlipidemia, unspecified: Secondary | ICD-10-CM | POA: Diagnosis not present

## 2019-01-17 DIAGNOSIS — M80051D Age-related osteoporosis with current pathological fracture, right femur, subsequent encounter for fracture with routine healing: Secondary | ICD-10-CM | POA: Diagnosis not present

## 2019-01-21 DIAGNOSIS — M80051D Age-related osteoporosis with current pathological fracture, right femur, subsequent encounter for fracture with routine healing: Secondary | ICD-10-CM | POA: Diagnosis not present

## 2019-01-21 DIAGNOSIS — R5381 Other malaise: Secondary | ICD-10-CM | POA: Diagnosis not present

## 2019-01-21 DIAGNOSIS — S72009A Fracture of unspecified part of neck of unspecified femur, initial encounter for closed fracture: Secondary | ICD-10-CM | POA: Diagnosis not present

## 2019-01-21 DIAGNOSIS — R2689 Other abnormalities of gait and mobility: Secondary | ICD-10-CM | POA: Diagnosis not present

## 2019-01-21 DIAGNOSIS — R279 Unspecified lack of coordination: Secondary | ICD-10-CM | POA: Diagnosis not present

## 2019-01-21 DIAGNOSIS — S72141D Displaced intertrochanteric fracture of right femur, subsequent encounter for closed fracture with routine healing: Secondary | ICD-10-CM | POA: Diagnosis not present

## 2019-01-21 DIAGNOSIS — W19XXXD Unspecified fall, subsequent encounter: Secondary | ICD-10-CM | POA: Diagnosis not present

## 2019-01-21 DIAGNOSIS — N183 Chronic kidney disease, stage 3 (moderate): Secondary | ICD-10-CM | POA: Diagnosis not present

## 2019-01-21 DIAGNOSIS — S72001A Fracture of unspecified part of neck of right femur, initial encounter for closed fracture: Secondary | ICD-10-CM | POA: Diagnosis not present

## 2019-01-21 DIAGNOSIS — J449 Chronic obstructive pulmonary disease, unspecified: Secondary | ICD-10-CM | POA: Diagnosis not present

## 2019-01-21 DIAGNOSIS — M6281 Muscle weakness (generalized): Secondary | ICD-10-CM | POA: Diagnosis not present

## 2019-02-04 DIAGNOSIS — M80051D Age-related osteoporosis with current pathological fracture, right femur, subsequent encounter for fracture with routine healing: Secondary | ICD-10-CM | POA: Diagnosis not present

## 2019-02-04 DIAGNOSIS — N183 Chronic kidney disease, stage 3 (moderate): Secondary | ICD-10-CM | POA: Diagnosis not present

## 2019-02-22 DIAGNOSIS — S72141D Displaced intertrochanteric fracture of right femur, subsequent encounter for closed fracture with routine healing: Secondary | ICD-10-CM | POA: Diagnosis not present

## 2019-02-22 DIAGNOSIS — M6281 Muscle weakness (generalized): Secondary | ICD-10-CM | POA: Diagnosis not present

## 2019-02-23 DIAGNOSIS — S72141D Displaced intertrochanteric fracture of right femur, subsequent encounter for closed fracture with routine healing: Secondary | ICD-10-CM | POA: Diagnosis not present

## 2019-02-23 DIAGNOSIS — N183 Chronic kidney disease, stage 3 (moderate): Secondary | ICD-10-CM | POA: Diagnosis not present

## 2019-02-23 DIAGNOSIS — M80051D Age-related osteoporosis with current pathological fracture, right femur, subsequent encounter for fracture with routine healing: Secondary | ICD-10-CM | POA: Diagnosis not present

## 2019-02-23 DIAGNOSIS — M6281 Muscle weakness (generalized): Secondary | ICD-10-CM | POA: Diagnosis not present

## 2019-02-24 DIAGNOSIS — S72141D Displaced intertrochanteric fracture of right femur, subsequent encounter for closed fracture with routine healing: Secondary | ICD-10-CM | POA: Diagnosis not present

## 2019-02-24 DIAGNOSIS — M6281 Muscle weakness (generalized): Secondary | ICD-10-CM | POA: Diagnosis not present

## 2019-02-25 DIAGNOSIS — M6281 Muscle weakness (generalized): Secondary | ICD-10-CM | POA: Diagnosis not present

## 2019-02-25 DIAGNOSIS — S72141D Displaced intertrochanteric fracture of right femur, subsequent encounter for closed fracture with routine healing: Secondary | ICD-10-CM | POA: Diagnosis not present

## 2019-02-26 DIAGNOSIS — S72141D Displaced intertrochanteric fracture of right femur, subsequent encounter for closed fracture with routine healing: Secondary | ICD-10-CM | POA: Diagnosis not present

## 2019-02-26 DIAGNOSIS — M6281 Muscle weakness (generalized): Secondary | ICD-10-CM | POA: Diagnosis not present

## 2019-03-01 DIAGNOSIS — S72141D Displaced intertrochanteric fracture of right femur, subsequent encounter for closed fracture with routine healing: Secondary | ICD-10-CM | POA: Diagnosis not present

## 2019-03-01 DIAGNOSIS — Z9889 Other specified postprocedural states: Secondary | ICD-10-CM | POA: Diagnosis not present

## 2019-03-01 DIAGNOSIS — M6281 Muscle weakness (generalized): Secondary | ICD-10-CM | POA: Diagnosis not present

## 2019-03-01 DIAGNOSIS — S7291XD Unspecified fracture of right femur, subsequent encounter for closed fracture with routine healing: Secondary | ICD-10-CM | POA: Diagnosis not present

## 2019-03-03 DIAGNOSIS — E7800 Pure hypercholesterolemia, unspecified: Secondary | ICD-10-CM | POA: Diagnosis not present

## 2019-03-03 DIAGNOSIS — Z8673 Personal history of transient ischemic attack (TIA), and cerebral infarction without residual deficits: Secondary | ICD-10-CM | POA: Diagnosis not present

## 2019-03-03 DIAGNOSIS — I63532 Cerebral infarction due to unspecified occlusion or stenosis of left posterior cerebral artery: Secondary | ICD-10-CM | POA: Diagnosis not present

## 2019-03-03 DIAGNOSIS — I2510 Atherosclerotic heart disease of native coronary artery without angina pectoris: Secondary | ICD-10-CM | POA: Diagnosis not present

## 2019-03-03 DIAGNOSIS — E785 Hyperlipidemia, unspecified: Secondary | ICD-10-CM | POA: Diagnosis not present

## 2019-03-03 DIAGNOSIS — J449 Chronic obstructive pulmonary disease, unspecified: Secondary | ICD-10-CM | POA: Diagnosis not present

## 2019-03-03 DIAGNOSIS — I63213 Cerebral infarction due to unspecified occlusion or stenosis of bilateral vertebral arteries: Secondary | ICD-10-CM | POA: Diagnosis not present

## 2019-03-03 DIAGNOSIS — M6281 Muscle weakness (generalized): Secondary | ICD-10-CM | POA: Diagnosis not present

## 2019-03-03 DIAGNOSIS — Z1159 Encounter for screening for other viral diseases: Secondary | ICD-10-CM | POA: Diagnosis not present

## 2019-03-03 DIAGNOSIS — I69328 Other speech and language deficits following cerebral infarction: Secondary | ICD-10-CM | POA: Diagnosis not present

## 2019-03-03 DIAGNOSIS — G9389 Other specified disorders of brain: Secondary | ICD-10-CM | POA: Diagnosis not present

## 2019-03-03 DIAGNOSIS — S72141D Displaced intertrochanteric fracture of right femur, subsequent encounter for closed fracture with routine healing: Secondary | ICD-10-CM | POA: Diagnosis not present

## 2019-03-03 DIAGNOSIS — R29702 NIHSS score 2: Secondary | ICD-10-CM | POA: Diagnosis not present

## 2019-03-03 DIAGNOSIS — M25561 Pain in right knee: Secondary | ICD-10-CM | POA: Diagnosis not present

## 2019-03-03 DIAGNOSIS — R627 Adult failure to thrive: Secondary | ICD-10-CM | POA: Diagnosis not present

## 2019-03-03 DIAGNOSIS — I739 Peripheral vascular disease, unspecified: Secondary | ICD-10-CM | POA: Diagnosis not present

## 2019-03-03 DIAGNOSIS — M79671 Pain in right foot: Secondary | ICD-10-CM | POA: Diagnosis not present

## 2019-03-03 DIAGNOSIS — M2041 Other hammer toe(s) (acquired), right foot: Secondary | ICD-10-CM | POA: Diagnosis not present

## 2019-03-03 DIAGNOSIS — E871 Hypo-osmolality and hyponatremia: Secondary | ICD-10-CM | POA: Diagnosis not present

## 2019-03-03 DIAGNOSIS — F5109 Other insomnia not due to a substance or known physiological condition: Secondary | ICD-10-CM | POA: Diagnosis not present

## 2019-03-03 DIAGNOSIS — Z7902 Long term (current) use of antithrombotics/antiplatelets: Secondary | ICD-10-CM | POA: Diagnosis not present

## 2019-03-03 DIAGNOSIS — I69391 Dysphagia following cerebral infarction: Secondary | ICD-10-CM | POA: Diagnosis not present

## 2019-03-03 DIAGNOSIS — N183 Chronic kidney disease, stage 3 (moderate): Secondary | ICD-10-CM | POA: Diagnosis not present

## 2019-03-03 DIAGNOSIS — Z7982 Long term (current) use of aspirin: Secondary | ICD-10-CM | POA: Diagnosis not present

## 2019-03-03 DIAGNOSIS — R404 Transient alteration of awareness: Secondary | ICD-10-CM | POA: Diagnosis not present

## 2019-03-03 DIAGNOSIS — Z6820 Body mass index (BMI) 20.0-20.9, adult: Secondary | ICD-10-CM | POA: Diagnosis not present

## 2019-03-03 DIAGNOSIS — I10 Essential (primary) hypertension: Secondary | ICD-10-CM | POA: Diagnosis not present

## 2019-03-03 DIAGNOSIS — G4700 Insomnia, unspecified: Secondary | ICD-10-CM | POA: Diagnosis not present

## 2019-03-03 DIAGNOSIS — E889 Metabolic disorder, unspecified: Secondary | ICD-10-CM | POA: Diagnosis not present

## 2019-03-03 DIAGNOSIS — R262 Difficulty in walking, not elsewhere classified: Secondary | ICD-10-CM | POA: Diagnosis not present

## 2019-03-03 DIAGNOSIS — Z87891 Personal history of nicotine dependence: Secondary | ICD-10-CM | POA: Diagnosis not present

## 2019-03-03 DIAGNOSIS — I6389 Other cerebral infarction: Secondary | ICD-10-CM | POA: Diagnosis not present

## 2019-03-03 DIAGNOSIS — E876 Hypokalemia: Secondary | ICD-10-CM | POA: Diagnosis not present

## 2019-03-03 DIAGNOSIS — I129 Hypertensive chronic kidney disease with stage 1 through stage 4 chronic kidney disease, or unspecified chronic kidney disease: Secondary | ICD-10-CM | POA: Diagnosis not present

## 2019-03-03 DIAGNOSIS — S72011D Unspecified intracapsular fracture of right femur, subsequent encounter for closed fracture with routine healing: Secondary | ICD-10-CM | POA: Diagnosis not present

## 2019-03-03 DIAGNOSIS — Z7401 Bed confinement status: Secondary | ICD-10-CM | POA: Diagnosis not present

## 2019-03-05 DIAGNOSIS — I2510 Atherosclerotic heart disease of native coronary artery without angina pectoris: Secondary | ICD-10-CM | POA: Diagnosis not present

## 2019-03-10 DIAGNOSIS — M79671 Pain in right foot: Secondary | ICD-10-CM | POA: Diagnosis not present

## 2019-03-10 DIAGNOSIS — Z87891 Personal history of nicotine dependence: Secondary | ICD-10-CM | POA: Diagnosis not present

## 2019-03-10 DIAGNOSIS — I739 Peripheral vascular disease, unspecified: Secondary | ICD-10-CM | POA: Diagnosis not present

## 2019-03-10 DIAGNOSIS — R404 Transient alteration of awareness: Secondary | ICD-10-CM | POA: Diagnosis not present

## 2019-03-10 DIAGNOSIS — M25561 Pain in right knee: Secondary | ICD-10-CM | POA: Diagnosis not present

## 2019-03-10 DIAGNOSIS — E785 Hyperlipidemia, unspecified: Secondary | ICD-10-CM | POA: Diagnosis not present

## 2019-03-10 DIAGNOSIS — R262 Difficulty in walking, not elsewhere classified: Secondary | ICD-10-CM | POA: Diagnosis not present

## 2019-03-10 DIAGNOSIS — F5109 Other insomnia not due to a substance or known physiological condition: Secondary | ICD-10-CM | POA: Diagnosis not present

## 2019-03-10 DIAGNOSIS — I6300 Cerebral infarction due to thrombosis of unspecified precerebral artery: Secondary | ICD-10-CM | POA: Diagnosis not present

## 2019-03-10 DIAGNOSIS — S72011D Unspecified intracapsular fracture of right femur, subsequent encounter for closed fracture with routine healing: Secondary | ICD-10-CM | POA: Diagnosis not present

## 2019-03-10 DIAGNOSIS — N183 Chronic kidney disease, stage 3 (moderate): Secondary | ICD-10-CM | POA: Diagnosis not present

## 2019-03-10 DIAGNOSIS — S72141D Displaced intertrochanteric fracture of right femur, subsequent encounter for closed fracture with routine healing: Secondary | ICD-10-CM | POA: Diagnosis not present

## 2019-03-10 DIAGNOSIS — E889 Metabolic disorder, unspecified: Secondary | ICD-10-CM | POA: Diagnosis not present

## 2019-03-10 DIAGNOSIS — I69391 Dysphagia following cerebral infarction: Secondary | ICD-10-CM | POA: Diagnosis not present

## 2019-03-10 DIAGNOSIS — E871 Hypo-osmolality and hyponatremia: Secondary | ICD-10-CM | POA: Diagnosis not present

## 2019-03-10 DIAGNOSIS — Z8673 Personal history of transient ischemic attack (TIA), and cerebral infarction without residual deficits: Secondary | ICD-10-CM | POA: Diagnosis not present

## 2019-03-10 DIAGNOSIS — M6281 Muscle weakness (generalized): Secondary | ICD-10-CM | POA: Diagnosis not present

## 2019-03-10 DIAGNOSIS — I10 Essential (primary) hypertension: Secondary | ICD-10-CM | POA: Diagnosis not present

## 2019-03-10 DIAGNOSIS — I63532 Cerebral infarction due to unspecified occlusion or stenosis of left posterior cerebral artery: Secondary | ICD-10-CM | POA: Diagnosis not present

## 2019-03-10 DIAGNOSIS — I69328 Other speech and language deficits following cerebral infarction: Secondary | ICD-10-CM | POA: Diagnosis not present

## 2019-03-10 DIAGNOSIS — Z7401 Bed confinement status: Secondary | ICD-10-CM | POA: Diagnosis not present

## 2019-03-10 DIAGNOSIS — E876 Hypokalemia: Secondary | ICD-10-CM | POA: Diagnosis not present

## 2019-03-12 DIAGNOSIS — S72011D Unspecified intracapsular fracture of right femur, subsequent encounter for closed fracture with routine healing: Secondary | ICD-10-CM | POA: Diagnosis not present

## 2019-03-12 DIAGNOSIS — I10 Essential (primary) hypertension: Secondary | ICD-10-CM | POA: Diagnosis not present

## 2019-03-12 DIAGNOSIS — I739 Peripheral vascular disease, unspecified: Secondary | ICD-10-CM | POA: Diagnosis not present

## 2019-03-12 DIAGNOSIS — I6300 Cerebral infarction due to thrombosis of unspecified precerebral artery: Secondary | ICD-10-CM | POA: Diagnosis not present

## 2019-03-16 DIAGNOSIS — I6300 Cerebral infarction due to thrombosis of unspecified precerebral artery: Secondary | ICD-10-CM | POA: Diagnosis not present

## 2019-03-16 DIAGNOSIS — E785 Hyperlipidemia, unspecified: Secondary | ICD-10-CM | POA: Diagnosis not present

## 2019-03-16 DIAGNOSIS — S72011D Unspecified intracapsular fracture of right femur, subsequent encounter for closed fracture with routine healing: Secondary | ICD-10-CM | POA: Diagnosis not present

## 2019-03-16 DIAGNOSIS — I10 Essential (primary) hypertension: Secondary | ICD-10-CM | POA: Diagnosis not present

## 2019-03-23 DIAGNOSIS — I6300 Cerebral infarction due to thrombosis of unspecified precerebral artery: Secondary | ICD-10-CM | POA: Diagnosis not present

## 2019-03-23 DIAGNOSIS — E785 Hyperlipidemia, unspecified: Secondary | ICD-10-CM | POA: Diagnosis not present

## 2019-03-23 DIAGNOSIS — I10 Essential (primary) hypertension: Secondary | ICD-10-CM | POA: Diagnosis not present

## 2019-03-23 DIAGNOSIS — I739 Peripheral vascular disease, unspecified: Secondary | ICD-10-CM | POA: Diagnosis not present

## 2019-03-30 DIAGNOSIS — I10 Essential (primary) hypertension: Secondary | ICD-10-CM | POA: Diagnosis not present

## 2019-03-30 DIAGNOSIS — I6300 Cerebral infarction due to thrombosis of unspecified precerebral artery: Secondary | ICD-10-CM | POA: Diagnosis not present

## 2019-03-30 DIAGNOSIS — S72011D Unspecified intracapsular fracture of right femur, subsequent encounter for closed fracture with routine healing: Secondary | ICD-10-CM | POA: Diagnosis not present

## 2019-03-31 DIAGNOSIS — S72141D Displaced intertrochanteric fracture of right femur, subsequent encounter for closed fracture with routine healing: Secondary | ICD-10-CM | POA: Diagnosis not present

## 2019-04-01 DIAGNOSIS — R609 Edema, unspecified: Secondary | ICD-10-CM | POA: Diagnosis not present

## 2019-04-01 DIAGNOSIS — R52 Pain, unspecified: Secondary | ICD-10-CM | POA: Diagnosis not present

## 2019-04-01 DIAGNOSIS — I10 Essential (primary) hypertension: Secondary | ICD-10-CM | POA: Diagnosis not present

## 2019-04-16 DIAGNOSIS — E7800 Pure hypercholesterolemia, unspecified: Secondary | ICD-10-CM | POA: Diagnosis not present

## 2019-04-16 DIAGNOSIS — I10 Essential (primary) hypertension: Secondary | ICD-10-CM | POA: Diagnosis not present

## 2019-04-19 DIAGNOSIS — R262 Difficulty in walking, not elsewhere classified: Secondary | ICD-10-CM | POA: Diagnosis not present

## 2019-04-19 DIAGNOSIS — M6281 Muscle weakness (generalized): Secondary | ICD-10-CM | POA: Diagnosis not present

## 2019-04-19 DIAGNOSIS — I69328 Other speech and language deficits following cerebral infarction: Secondary | ICD-10-CM | POA: Diagnosis not present

## 2019-04-19 DIAGNOSIS — S72011D Unspecified intracapsular fracture of right femur, subsequent encounter for closed fracture with routine healing: Secondary | ICD-10-CM | POA: Diagnosis not present

## 2019-04-19 DIAGNOSIS — I69391 Dysphagia following cerebral infarction: Secondary | ICD-10-CM | POA: Diagnosis not present

## 2019-04-20 DIAGNOSIS — I69391 Dysphagia following cerebral infarction: Secondary | ICD-10-CM | POA: Diagnosis not present

## 2019-04-20 DIAGNOSIS — S72011D Unspecified intracapsular fracture of right femur, subsequent encounter for closed fracture with routine healing: Secondary | ICD-10-CM | POA: Diagnosis not present

## 2019-04-20 DIAGNOSIS — R262 Difficulty in walking, not elsewhere classified: Secondary | ICD-10-CM | POA: Diagnosis not present

## 2019-04-20 DIAGNOSIS — M6281 Muscle weakness (generalized): Secondary | ICD-10-CM | POA: Diagnosis not present

## 2019-04-20 DIAGNOSIS — I69328 Other speech and language deficits following cerebral infarction: Secondary | ICD-10-CM | POA: Diagnosis not present

## 2019-04-21 DIAGNOSIS — I69391 Dysphagia following cerebral infarction: Secondary | ICD-10-CM | POA: Diagnosis not present

## 2019-04-21 DIAGNOSIS — R262 Difficulty in walking, not elsewhere classified: Secondary | ICD-10-CM | POA: Diagnosis not present

## 2019-04-21 DIAGNOSIS — I69328 Other speech and language deficits following cerebral infarction: Secondary | ICD-10-CM | POA: Diagnosis not present

## 2019-04-21 DIAGNOSIS — M6281 Muscle weakness (generalized): Secondary | ICD-10-CM | POA: Diagnosis not present

## 2019-04-21 DIAGNOSIS — S72011D Unspecified intracapsular fracture of right femur, subsequent encounter for closed fracture with routine healing: Secondary | ICD-10-CM | POA: Diagnosis not present

## 2019-04-22 DIAGNOSIS — I69391 Dysphagia following cerebral infarction: Secondary | ICD-10-CM | POA: Diagnosis not present

## 2019-04-22 DIAGNOSIS — S72011D Unspecified intracapsular fracture of right femur, subsequent encounter for closed fracture with routine healing: Secondary | ICD-10-CM | POA: Diagnosis not present

## 2019-04-22 DIAGNOSIS — M6281 Muscle weakness (generalized): Secondary | ICD-10-CM | POA: Diagnosis not present

## 2019-04-22 DIAGNOSIS — I69328 Other speech and language deficits following cerebral infarction: Secondary | ICD-10-CM | POA: Diagnosis not present

## 2019-04-22 DIAGNOSIS — R262 Difficulty in walking, not elsewhere classified: Secondary | ICD-10-CM | POA: Diagnosis not present

## 2019-04-23 DIAGNOSIS — R262 Difficulty in walking, not elsewhere classified: Secondary | ICD-10-CM | POA: Diagnosis not present

## 2019-04-23 DIAGNOSIS — S72011D Unspecified intracapsular fracture of right femur, subsequent encounter for closed fracture with routine healing: Secondary | ICD-10-CM | POA: Diagnosis not present

## 2019-04-23 DIAGNOSIS — I69328 Other speech and language deficits following cerebral infarction: Secondary | ICD-10-CM | POA: Diagnosis not present

## 2019-04-23 DIAGNOSIS — M6281 Muscle weakness (generalized): Secondary | ICD-10-CM | POA: Diagnosis not present

## 2019-04-23 DIAGNOSIS — I69391 Dysphagia following cerebral infarction: Secondary | ICD-10-CM | POA: Diagnosis not present

## 2019-04-26 DIAGNOSIS — R262 Difficulty in walking, not elsewhere classified: Secondary | ICD-10-CM | POA: Diagnosis not present

## 2019-04-26 DIAGNOSIS — M6281 Muscle weakness (generalized): Secondary | ICD-10-CM | POA: Diagnosis not present

## 2019-04-26 DIAGNOSIS — I69391 Dysphagia following cerebral infarction: Secondary | ICD-10-CM | POA: Diagnosis not present

## 2019-04-26 DIAGNOSIS — S72011D Unspecified intracapsular fracture of right femur, subsequent encounter for closed fracture with routine healing: Secondary | ICD-10-CM | POA: Diagnosis not present

## 2019-04-26 DIAGNOSIS — I69328 Other speech and language deficits following cerebral infarction: Secondary | ICD-10-CM | POA: Diagnosis not present

## 2019-04-27 DIAGNOSIS — I69391 Dysphagia following cerebral infarction: Secondary | ICD-10-CM | POA: Diagnosis not present

## 2019-04-27 DIAGNOSIS — M6281 Muscle weakness (generalized): Secondary | ICD-10-CM | POA: Diagnosis not present

## 2019-04-27 DIAGNOSIS — I69328 Other speech and language deficits following cerebral infarction: Secondary | ICD-10-CM | POA: Diagnosis not present

## 2019-04-27 DIAGNOSIS — R262 Difficulty in walking, not elsewhere classified: Secondary | ICD-10-CM | POA: Diagnosis not present

## 2019-04-27 DIAGNOSIS — S72011D Unspecified intracapsular fracture of right femur, subsequent encounter for closed fracture with routine healing: Secondary | ICD-10-CM | POA: Diagnosis not present

## 2019-05-03 DIAGNOSIS — M6281 Muscle weakness (generalized): Secondary | ICD-10-CM | POA: Diagnosis not present

## 2019-05-03 DIAGNOSIS — S72011D Unspecified intracapsular fracture of right femur, subsequent encounter for closed fracture with routine healing: Secondary | ICD-10-CM | POA: Diagnosis not present

## 2019-05-03 DIAGNOSIS — R262 Difficulty in walking, not elsewhere classified: Secondary | ICD-10-CM | POA: Diagnosis not present

## 2019-05-03 DIAGNOSIS — I69328 Other speech and language deficits following cerebral infarction: Secondary | ICD-10-CM | POA: Diagnosis not present

## 2019-05-03 DIAGNOSIS — I69391 Dysphagia following cerebral infarction: Secondary | ICD-10-CM | POA: Diagnosis not present

## 2019-05-04 DIAGNOSIS — I69328 Other speech and language deficits following cerebral infarction: Secondary | ICD-10-CM | POA: Diagnosis not present

## 2019-05-04 DIAGNOSIS — R262 Difficulty in walking, not elsewhere classified: Secondary | ICD-10-CM | POA: Diagnosis not present

## 2019-05-04 DIAGNOSIS — M6281 Muscle weakness (generalized): Secondary | ICD-10-CM | POA: Diagnosis not present

## 2019-05-04 DIAGNOSIS — I69391 Dysphagia following cerebral infarction: Secondary | ICD-10-CM | POA: Diagnosis not present

## 2019-05-04 DIAGNOSIS — S72011D Unspecified intracapsular fracture of right femur, subsequent encounter for closed fracture with routine healing: Secondary | ICD-10-CM | POA: Diagnosis not present

## 2019-05-05 DIAGNOSIS — M6281 Muscle weakness (generalized): Secondary | ICD-10-CM | POA: Diagnosis not present

## 2019-05-05 DIAGNOSIS — R262 Difficulty in walking, not elsewhere classified: Secondary | ICD-10-CM | POA: Diagnosis not present

## 2019-05-05 DIAGNOSIS — I69328 Other speech and language deficits following cerebral infarction: Secondary | ICD-10-CM | POA: Diagnosis not present

## 2019-05-05 DIAGNOSIS — I69391 Dysphagia following cerebral infarction: Secondary | ICD-10-CM | POA: Diagnosis not present

## 2019-05-05 DIAGNOSIS — S72011D Unspecified intracapsular fracture of right femur, subsequent encounter for closed fracture with routine healing: Secondary | ICD-10-CM | POA: Diagnosis not present

## 2019-05-06 DIAGNOSIS — R262 Difficulty in walking, not elsewhere classified: Secondary | ICD-10-CM | POA: Diagnosis not present

## 2019-05-06 DIAGNOSIS — I69391 Dysphagia following cerebral infarction: Secondary | ICD-10-CM | POA: Diagnosis not present

## 2019-05-06 DIAGNOSIS — I69328 Other speech and language deficits following cerebral infarction: Secondary | ICD-10-CM | POA: Diagnosis not present

## 2019-05-06 DIAGNOSIS — M6281 Muscle weakness (generalized): Secondary | ICD-10-CM | POA: Diagnosis not present

## 2019-05-06 DIAGNOSIS — S72011D Unspecified intracapsular fracture of right femur, subsequent encounter for closed fracture with routine healing: Secondary | ICD-10-CM | POA: Diagnosis not present

## 2019-05-07 DIAGNOSIS — R262 Difficulty in walking, not elsewhere classified: Secondary | ICD-10-CM | POA: Diagnosis not present

## 2019-05-07 DIAGNOSIS — S72011D Unspecified intracapsular fracture of right femur, subsequent encounter for closed fracture with routine healing: Secondary | ICD-10-CM | POA: Diagnosis not present

## 2019-05-07 DIAGNOSIS — I69391 Dysphagia following cerebral infarction: Secondary | ICD-10-CM | POA: Diagnosis not present

## 2019-05-07 DIAGNOSIS — M6281 Muscle weakness (generalized): Secondary | ICD-10-CM | POA: Diagnosis not present

## 2019-05-07 DIAGNOSIS — I69328 Other speech and language deficits following cerebral infarction: Secondary | ICD-10-CM | POA: Diagnosis not present

## 2019-05-10 DIAGNOSIS — N183 Chronic kidney disease, stage 3 (moderate): Secondary | ICD-10-CM | POA: Diagnosis not present

## 2019-05-11 DIAGNOSIS — R262 Difficulty in walking, not elsewhere classified: Secondary | ICD-10-CM | POA: Diagnosis not present

## 2019-05-11 DIAGNOSIS — S72011D Unspecified intracapsular fracture of right femur, subsequent encounter for closed fracture with routine healing: Secondary | ICD-10-CM | POA: Diagnosis not present

## 2019-05-11 DIAGNOSIS — I69391 Dysphagia following cerebral infarction: Secondary | ICD-10-CM | POA: Diagnosis not present

## 2019-05-11 DIAGNOSIS — M6281 Muscle weakness (generalized): Secondary | ICD-10-CM | POA: Diagnosis not present

## 2019-05-11 DIAGNOSIS — I69328 Other speech and language deficits following cerebral infarction: Secondary | ICD-10-CM | POA: Diagnosis not present

## 2019-05-13 DIAGNOSIS — S72011D Unspecified intracapsular fracture of right femur, subsequent encounter for closed fracture with routine healing: Secondary | ICD-10-CM | POA: Diagnosis not present

## 2019-05-13 DIAGNOSIS — I69328 Other speech and language deficits following cerebral infarction: Secondary | ICD-10-CM | POA: Diagnosis not present

## 2019-05-13 DIAGNOSIS — M6281 Muscle weakness (generalized): Secondary | ICD-10-CM | POA: Diagnosis not present

## 2019-05-13 DIAGNOSIS — I69391 Dysphagia following cerebral infarction: Secondary | ICD-10-CM | POA: Diagnosis not present

## 2019-05-13 DIAGNOSIS — R262 Difficulty in walking, not elsewhere classified: Secondary | ICD-10-CM | POA: Diagnosis not present

## 2019-05-14 DIAGNOSIS — M6281 Muscle weakness (generalized): Secondary | ICD-10-CM | POA: Diagnosis not present

## 2019-05-14 DIAGNOSIS — S72011D Unspecified intracapsular fracture of right femur, subsequent encounter for closed fracture with routine healing: Secondary | ICD-10-CM | POA: Diagnosis not present

## 2019-05-14 DIAGNOSIS — I69391 Dysphagia following cerebral infarction: Secondary | ICD-10-CM | POA: Diagnosis not present

## 2019-05-14 DIAGNOSIS — I69328 Other speech and language deficits following cerebral infarction: Secondary | ICD-10-CM | POA: Diagnosis not present

## 2019-05-14 DIAGNOSIS — R262 Difficulty in walking, not elsewhere classified: Secondary | ICD-10-CM | POA: Diagnosis not present

## 2019-05-15 DIAGNOSIS — R262 Difficulty in walking, not elsewhere classified: Secondary | ICD-10-CM | POA: Diagnosis not present

## 2019-05-15 DIAGNOSIS — I69391 Dysphagia following cerebral infarction: Secondary | ICD-10-CM | POA: Diagnosis not present

## 2019-05-15 DIAGNOSIS — I69328 Other speech and language deficits following cerebral infarction: Secondary | ICD-10-CM | POA: Diagnosis not present

## 2019-05-15 DIAGNOSIS — S72011D Unspecified intracapsular fracture of right femur, subsequent encounter for closed fracture with routine healing: Secondary | ICD-10-CM | POA: Diagnosis not present

## 2019-05-15 DIAGNOSIS — M6281 Muscle weakness (generalized): Secondary | ICD-10-CM | POA: Diagnosis not present

## 2019-05-17 DIAGNOSIS — I10 Essential (primary) hypertension: Secondary | ICD-10-CM | POA: Diagnosis not present

## 2019-05-17 DIAGNOSIS — R262 Difficulty in walking, not elsewhere classified: Secondary | ICD-10-CM | POA: Diagnosis not present

## 2019-05-17 DIAGNOSIS — I69391 Dysphagia following cerebral infarction: Secondary | ICD-10-CM | POA: Diagnosis not present

## 2019-05-17 DIAGNOSIS — M6281 Muscle weakness (generalized): Secondary | ICD-10-CM | POA: Diagnosis not present

## 2019-05-17 DIAGNOSIS — I69328 Other speech and language deficits following cerebral infarction: Secondary | ICD-10-CM | POA: Diagnosis not present

## 2019-05-17 DIAGNOSIS — S72011D Unspecified intracapsular fracture of right femur, subsequent encounter for closed fracture with routine healing: Secondary | ICD-10-CM | POA: Diagnosis not present

## 2019-05-17 DIAGNOSIS — E782 Mixed hyperlipidemia: Secondary | ICD-10-CM | POA: Diagnosis not present

## 2019-05-18 DIAGNOSIS — I69328 Other speech and language deficits following cerebral infarction: Secondary | ICD-10-CM | POA: Diagnosis not present

## 2019-05-18 DIAGNOSIS — M6281 Muscle weakness (generalized): Secondary | ICD-10-CM | POA: Diagnosis not present

## 2019-05-18 DIAGNOSIS — I69391 Dysphagia following cerebral infarction: Secondary | ICD-10-CM | POA: Diagnosis not present

## 2019-05-18 DIAGNOSIS — R262 Difficulty in walking, not elsewhere classified: Secondary | ICD-10-CM | POA: Diagnosis not present

## 2019-05-18 DIAGNOSIS — S72011D Unspecified intracapsular fracture of right femur, subsequent encounter for closed fracture with routine healing: Secondary | ICD-10-CM | POA: Diagnosis not present

## 2019-05-19 DIAGNOSIS — M6281 Muscle weakness (generalized): Secondary | ICD-10-CM | POA: Diagnosis not present

## 2019-05-19 DIAGNOSIS — R262 Difficulty in walking, not elsewhere classified: Secondary | ICD-10-CM | POA: Diagnosis not present

## 2019-05-19 DIAGNOSIS — I69328 Other speech and language deficits following cerebral infarction: Secondary | ICD-10-CM | POA: Diagnosis not present

## 2019-05-19 DIAGNOSIS — S72011D Unspecified intracapsular fracture of right femur, subsequent encounter for closed fracture with routine healing: Secondary | ICD-10-CM | POA: Diagnosis not present

## 2019-05-19 DIAGNOSIS — I69391 Dysphagia following cerebral infarction: Secondary | ICD-10-CM | POA: Diagnosis not present

## 2019-05-20 DIAGNOSIS — I69391 Dysphagia following cerebral infarction: Secondary | ICD-10-CM | POA: Diagnosis not present

## 2019-05-20 DIAGNOSIS — S72011D Unspecified intracapsular fracture of right femur, subsequent encounter for closed fracture with routine healing: Secondary | ICD-10-CM | POA: Diagnosis not present

## 2019-05-20 DIAGNOSIS — M6281 Muscle weakness (generalized): Secondary | ICD-10-CM | POA: Diagnosis not present

## 2019-05-20 DIAGNOSIS — I69328 Other speech and language deficits following cerebral infarction: Secondary | ICD-10-CM | POA: Diagnosis not present

## 2019-05-20 DIAGNOSIS — R262 Difficulty in walking, not elsewhere classified: Secondary | ICD-10-CM | POA: Diagnosis not present

## 2019-05-25 DIAGNOSIS — I69391 Dysphagia following cerebral infarction: Secondary | ICD-10-CM | POA: Diagnosis not present

## 2019-05-25 DIAGNOSIS — S72011D Unspecified intracapsular fracture of right femur, subsequent encounter for closed fracture with routine healing: Secondary | ICD-10-CM | POA: Diagnosis not present

## 2019-05-25 DIAGNOSIS — M6281 Muscle weakness (generalized): Secondary | ICD-10-CM | POA: Diagnosis not present

## 2019-05-25 DIAGNOSIS — I69328 Other speech and language deficits following cerebral infarction: Secondary | ICD-10-CM | POA: Diagnosis not present

## 2019-05-25 DIAGNOSIS — R262 Difficulty in walking, not elsewhere classified: Secondary | ICD-10-CM | POA: Diagnosis not present

## 2019-05-26 DIAGNOSIS — R262 Difficulty in walking, not elsewhere classified: Secondary | ICD-10-CM | POA: Diagnosis not present

## 2019-05-26 DIAGNOSIS — I69328 Other speech and language deficits following cerebral infarction: Secondary | ICD-10-CM | POA: Diagnosis not present

## 2019-05-26 DIAGNOSIS — I69391 Dysphagia following cerebral infarction: Secondary | ICD-10-CM | POA: Diagnosis not present

## 2019-05-26 DIAGNOSIS — M6281 Muscle weakness (generalized): Secondary | ICD-10-CM | POA: Diagnosis not present

## 2019-05-26 DIAGNOSIS — S72011D Unspecified intracapsular fracture of right femur, subsequent encounter for closed fracture with routine healing: Secondary | ICD-10-CM | POA: Diagnosis not present

## 2019-05-27 DIAGNOSIS — S72011D Unspecified intracapsular fracture of right femur, subsequent encounter for closed fracture with routine healing: Secondary | ICD-10-CM | POA: Diagnosis not present

## 2019-05-27 DIAGNOSIS — I69328 Other speech and language deficits following cerebral infarction: Secondary | ICD-10-CM | POA: Diagnosis not present

## 2019-05-27 DIAGNOSIS — R262 Difficulty in walking, not elsewhere classified: Secondary | ICD-10-CM | POA: Diagnosis not present

## 2019-05-27 DIAGNOSIS — M6281 Muscle weakness (generalized): Secondary | ICD-10-CM | POA: Diagnosis not present

## 2019-05-27 DIAGNOSIS — I69391 Dysphagia following cerebral infarction: Secondary | ICD-10-CM | POA: Diagnosis not present

## 2019-05-28 DIAGNOSIS — I69328 Other speech and language deficits following cerebral infarction: Secondary | ICD-10-CM | POA: Diagnosis not present

## 2019-05-28 DIAGNOSIS — S72011D Unspecified intracapsular fracture of right femur, subsequent encounter for closed fracture with routine healing: Secondary | ICD-10-CM | POA: Diagnosis not present

## 2019-05-28 DIAGNOSIS — M6281 Muscle weakness (generalized): Secondary | ICD-10-CM | POA: Diagnosis not present

## 2019-05-28 DIAGNOSIS — R262 Difficulty in walking, not elsewhere classified: Secondary | ICD-10-CM | POA: Diagnosis not present

## 2019-05-28 DIAGNOSIS — I69391 Dysphagia following cerebral infarction: Secondary | ICD-10-CM | POA: Diagnosis not present

## 2019-05-31 DIAGNOSIS — M6281 Muscle weakness (generalized): Secondary | ICD-10-CM | POA: Diagnosis not present

## 2019-05-31 DIAGNOSIS — I69391 Dysphagia following cerebral infarction: Secondary | ICD-10-CM | POA: Diagnosis not present

## 2019-05-31 DIAGNOSIS — R262 Difficulty in walking, not elsewhere classified: Secondary | ICD-10-CM | POA: Diagnosis not present

## 2019-05-31 DIAGNOSIS — S72011D Unspecified intracapsular fracture of right femur, subsequent encounter for closed fracture with routine healing: Secondary | ICD-10-CM | POA: Diagnosis not present

## 2019-05-31 DIAGNOSIS — I69328 Other speech and language deficits following cerebral infarction: Secondary | ICD-10-CM | POA: Diagnosis not present

## 2019-06-01 DIAGNOSIS — S72011D Unspecified intracapsular fracture of right femur, subsequent encounter for closed fracture with routine healing: Secondary | ICD-10-CM | POA: Diagnosis not present

## 2019-06-01 DIAGNOSIS — M6281 Muscle weakness (generalized): Secondary | ICD-10-CM | POA: Diagnosis not present

## 2019-06-01 DIAGNOSIS — R262 Difficulty in walking, not elsewhere classified: Secondary | ICD-10-CM | POA: Diagnosis not present

## 2019-06-01 DIAGNOSIS — I69328 Other speech and language deficits following cerebral infarction: Secondary | ICD-10-CM | POA: Diagnosis not present

## 2019-06-01 DIAGNOSIS — I69391 Dysphagia following cerebral infarction: Secondary | ICD-10-CM | POA: Diagnosis not present

## 2019-06-02 DIAGNOSIS — M6281 Muscle weakness (generalized): Secondary | ICD-10-CM | POA: Diagnosis not present

## 2019-06-02 DIAGNOSIS — R262 Difficulty in walking, not elsewhere classified: Secondary | ICD-10-CM | POA: Diagnosis not present

## 2019-06-02 DIAGNOSIS — S72011D Unspecified intracapsular fracture of right femur, subsequent encounter for closed fracture with routine healing: Secondary | ICD-10-CM | POA: Diagnosis not present

## 2019-06-02 DIAGNOSIS — I69391 Dysphagia following cerebral infarction: Secondary | ICD-10-CM | POA: Diagnosis not present

## 2019-06-02 DIAGNOSIS — I69328 Other speech and language deficits following cerebral infarction: Secondary | ICD-10-CM | POA: Diagnosis not present

## 2019-06-03 DIAGNOSIS — S72011D Unspecified intracapsular fracture of right femur, subsequent encounter for closed fracture with routine healing: Secondary | ICD-10-CM | POA: Diagnosis not present

## 2019-06-03 DIAGNOSIS — R262 Difficulty in walking, not elsewhere classified: Secondary | ICD-10-CM | POA: Diagnosis not present

## 2019-06-03 DIAGNOSIS — I69328 Other speech and language deficits following cerebral infarction: Secondary | ICD-10-CM | POA: Diagnosis not present

## 2019-06-03 DIAGNOSIS — I69391 Dysphagia following cerebral infarction: Secondary | ICD-10-CM | POA: Diagnosis not present

## 2019-06-03 DIAGNOSIS — M6281 Muscle weakness (generalized): Secondary | ICD-10-CM | POA: Diagnosis not present

## 2019-06-04 DIAGNOSIS — S72011D Unspecified intracapsular fracture of right femur, subsequent encounter for closed fracture with routine healing: Secondary | ICD-10-CM | POA: Diagnosis not present

## 2019-06-04 DIAGNOSIS — I69328 Other speech and language deficits following cerebral infarction: Secondary | ICD-10-CM | POA: Diagnosis not present

## 2019-06-04 DIAGNOSIS — I69391 Dysphagia following cerebral infarction: Secondary | ICD-10-CM | POA: Diagnosis not present

## 2019-06-04 DIAGNOSIS — M6281 Muscle weakness (generalized): Secondary | ICD-10-CM | POA: Diagnosis not present

## 2019-06-04 DIAGNOSIS — R262 Difficulty in walking, not elsewhere classified: Secondary | ICD-10-CM | POA: Diagnosis not present

## 2019-06-07 DIAGNOSIS — I69391 Dysphagia following cerebral infarction: Secondary | ICD-10-CM | POA: Diagnosis not present

## 2019-06-07 DIAGNOSIS — M6281 Muscle weakness (generalized): Secondary | ICD-10-CM | POA: Diagnosis not present

## 2019-06-07 DIAGNOSIS — S72011D Unspecified intracapsular fracture of right femur, subsequent encounter for closed fracture with routine healing: Secondary | ICD-10-CM | POA: Diagnosis not present

## 2019-06-07 DIAGNOSIS — I69328 Other speech and language deficits following cerebral infarction: Secondary | ICD-10-CM | POA: Diagnosis not present

## 2019-06-07 DIAGNOSIS — R262 Difficulty in walking, not elsewhere classified: Secondary | ICD-10-CM | POA: Diagnosis not present

## 2019-06-08 DIAGNOSIS — M6281 Muscle weakness (generalized): Secondary | ICD-10-CM | POA: Diagnosis not present

## 2019-06-08 DIAGNOSIS — S72011D Unspecified intracapsular fracture of right femur, subsequent encounter for closed fracture with routine healing: Secondary | ICD-10-CM | POA: Diagnosis not present

## 2019-06-08 DIAGNOSIS — I69328 Other speech and language deficits following cerebral infarction: Secondary | ICD-10-CM | POA: Diagnosis not present

## 2019-06-08 DIAGNOSIS — R262 Difficulty in walking, not elsewhere classified: Secondary | ICD-10-CM | POA: Diagnosis not present

## 2019-06-08 DIAGNOSIS — I69391 Dysphagia following cerebral infarction: Secondary | ICD-10-CM | POA: Diagnosis not present

## 2019-06-09 DIAGNOSIS — N183 Chronic kidney disease, stage 3 (moderate): Secondary | ICD-10-CM | POA: Diagnosis not present

## 2020-12-01 DIAGNOSIS — F331 Major depressive disorder, recurrent, moderate: Secondary | ICD-10-CM | POA: Diagnosis not present

## 2020-12-01 DIAGNOSIS — N1831 Chronic kidney disease, stage 3a: Secondary | ICD-10-CM | POA: Diagnosis not present

## 2020-12-01 DIAGNOSIS — R4582 Worries: Secondary | ICD-10-CM | POA: Diagnosis not present

## 2020-12-01 DIAGNOSIS — F17210 Nicotine dependence, cigarettes, uncomplicated: Secondary | ICD-10-CM | POA: Diagnosis not present

## 2020-12-01 DIAGNOSIS — J449 Chronic obstructive pulmonary disease, unspecified: Secondary | ICD-10-CM | POA: Diagnosis not present

## 2020-12-01 DIAGNOSIS — E7849 Other hyperlipidemia: Secondary | ICD-10-CM | POA: Diagnosis not present

## 2020-12-01 DIAGNOSIS — I739 Peripheral vascular disease, unspecified: Secondary | ICD-10-CM | POA: Diagnosis not present

## 2020-12-01 DIAGNOSIS — I10 Essential (primary) hypertension: Secondary | ICD-10-CM | POA: Diagnosis not present

## 2020-12-30 DIAGNOSIS — R911 Solitary pulmonary nodule: Secondary | ICD-10-CM | POA: Diagnosis not present

## 2020-12-30 DIAGNOSIS — E869 Volume depletion, unspecified: Secondary | ICD-10-CM | POA: Diagnosis not present

## 2020-12-30 DIAGNOSIS — J449 Chronic obstructive pulmonary disease, unspecified: Secondary | ICD-10-CM | POA: Diagnosis not present

## 2020-12-30 DIAGNOSIS — N189 Chronic kidney disease, unspecified: Secondary | ICD-10-CM | POA: Diagnosis not present

## 2020-12-30 DIAGNOSIS — E785 Hyperlipidemia, unspecified: Secondary | ICD-10-CM | POA: Diagnosis not present

## 2020-12-30 DIAGNOSIS — R4182 Altered mental status, unspecified: Secondary | ICD-10-CM | POA: Diagnosis not present

## 2020-12-30 DIAGNOSIS — I951 Orthostatic hypotension: Secondary | ICD-10-CM | POA: Diagnosis not present

## 2020-12-30 DIAGNOSIS — I129 Hypertensive chronic kidney disease with stage 1 through stage 4 chronic kidney disease, or unspecified chronic kidney disease: Secondary | ICD-10-CM | POA: Diagnosis not present

## 2020-12-30 DIAGNOSIS — R531 Weakness: Secondary | ICD-10-CM | POA: Diagnosis not present

## 2020-12-30 DIAGNOSIS — F17210 Nicotine dependence, cigarettes, uncomplicated: Secondary | ICD-10-CM | POA: Diagnosis not present

## 2020-12-30 DIAGNOSIS — R9431 Abnormal electrocardiogram [ECG] [EKG]: Secondary | ICD-10-CM | POA: Diagnosis not present

## 2020-12-30 DIAGNOSIS — Z20822 Contact with and (suspected) exposure to covid-19: Secondary | ICD-10-CM | POA: Diagnosis not present

## 2020-12-30 DIAGNOSIS — Z8673 Personal history of transient ischemic attack (TIA), and cerebral infarction without residual deficits: Secondary | ICD-10-CM | POA: Diagnosis not present

## 2020-12-30 DIAGNOSIS — N3000 Acute cystitis without hematuria: Secondary | ICD-10-CM | POA: Diagnosis not present

## 2021-01-19 ENCOUNTER — Emergency Department (HOSPITAL_COMMUNITY): Payer: Medicare Other

## 2021-01-19 ENCOUNTER — Inpatient Hospital Stay (HOSPITAL_COMMUNITY)
Admission: EM | Admit: 2021-01-19 | Discharge: 2021-02-02 | DRG: 004 | Disposition: A | Discharge status: Other Acute Inpatient Hospital | Payer: Medicare Other | Attending: Internal Medicine | Admitting: Internal Medicine

## 2021-01-19 ENCOUNTER — Encounter (HOSPITAL_COMMUNITY): Payer: Self-pay | Admitting: Critical Care Medicine

## 2021-01-19 ENCOUNTER — Other Ambulatory Visit: Payer: Self-pay

## 2021-01-19 DIAGNOSIS — R569 Unspecified convulsions: Secondary | ICD-10-CM

## 2021-01-19 DIAGNOSIS — K219 Gastro-esophageal reflux disease without esophagitis: Secondary | ICD-10-CM | POA: Diagnosis present

## 2021-01-19 DIAGNOSIS — I6521 Occlusion and stenosis of right carotid artery: Secondary | ICD-10-CM | POA: Diagnosis not present

## 2021-01-19 DIAGNOSIS — I708 Atherosclerosis of other arteries: Secondary | ICD-10-CM | POA: Diagnosis present

## 2021-01-19 DIAGNOSIS — I6330 Cerebral infarction due to thrombosis of unspecified cerebral artery: Principal | ICD-10-CM | POA: Insufficient documentation

## 2021-01-19 DIAGNOSIS — J449 Chronic obstructive pulmonary disease, unspecified: Secondary | ICD-10-CM | POA: Diagnosis present

## 2021-01-19 DIAGNOSIS — E781 Pure hyperglyceridemia: Secondary | ICD-10-CM | POA: Diagnosis present

## 2021-01-19 DIAGNOSIS — D631 Anemia in chronic kidney disease: Secondary | ICD-10-CM | POA: Diagnosis present

## 2021-01-19 DIAGNOSIS — R6883 Chills (without fever): Secondary | ICD-10-CM | POA: Diagnosis not present

## 2021-01-19 DIAGNOSIS — R001 Bradycardia, unspecified: Secondary | ICD-10-CM | POA: Diagnosis not present

## 2021-01-19 DIAGNOSIS — Z4659 Encounter for fitting and adjustment of other gastrointestinal appliance and device: Secondary | ICD-10-CM

## 2021-01-19 DIAGNOSIS — N179 Acute kidney failure, unspecified: Secondary | ICD-10-CM | POA: Diagnosis present

## 2021-01-19 DIAGNOSIS — R64 Cachexia: Secondary | ICD-10-CM | POA: Diagnosis present

## 2021-01-19 DIAGNOSIS — I6523 Occlusion and stenosis of bilateral carotid arteries: Secondary | ICD-10-CM | POA: Diagnosis not present

## 2021-01-19 DIAGNOSIS — Z781 Physical restraint status: Secondary | ICD-10-CM

## 2021-01-19 DIAGNOSIS — I480 Paroxysmal atrial fibrillation: Secondary | ICD-10-CM | POA: Diagnosis not present

## 2021-01-19 DIAGNOSIS — Z20822 Contact with and (suspected) exposure to covid-19: Secondary | ICD-10-CM | POA: Diagnosis present

## 2021-01-19 DIAGNOSIS — J9602 Acute respiratory failure with hypercapnia: Secondary | ICD-10-CM

## 2021-01-19 DIAGNOSIS — I69354 Hemiplegia and hemiparesis following cerebral infarction affecting left non-dominant side: Secondary | ICD-10-CM

## 2021-01-19 DIAGNOSIS — Z809 Family history of malignant neoplasm, unspecified: Secondary | ICD-10-CM | POA: Diagnosis not present

## 2021-01-19 DIAGNOSIS — E785 Hyperlipidemia, unspecified: Secondary | ICD-10-CM | POA: Diagnosis present

## 2021-01-19 DIAGNOSIS — N1831 Chronic kidney disease, stage 3a: Secondary | ICD-10-CM | POA: Diagnosis present

## 2021-01-19 DIAGNOSIS — Z833 Family history of diabetes mellitus: Secondary | ICD-10-CM | POA: Diagnosis not present

## 2021-01-19 DIAGNOSIS — Z8673 Personal history of transient ischemic attack (TIA), and cerebral infarction without residual deficits: Secondary | ICD-10-CM | POA: Diagnosis not present

## 2021-01-19 DIAGNOSIS — J9601 Acute respiratory failure with hypoxia: Secondary | ICD-10-CM | POA: Diagnosis not present

## 2021-01-19 DIAGNOSIS — Z9911 Dependence on respirator [ventilator] status: Secondary | ICD-10-CM

## 2021-01-19 DIAGNOSIS — Z8249 Family history of ischemic heart disease and other diseases of the circulatory system: Secondary | ICD-10-CM

## 2021-01-19 DIAGNOSIS — J156 Pneumonia due to other aerobic Gram-negative bacteria: Secondary | ICD-10-CM | POA: Diagnosis not present

## 2021-01-19 DIAGNOSIS — E43 Unspecified severe protein-calorie malnutrition: Secondary | ICD-10-CM | POA: Diagnosis not present

## 2021-01-19 DIAGNOSIS — J988 Other specified respiratory disorders: Secondary | ICD-10-CM | POA: Diagnosis not present

## 2021-01-19 DIAGNOSIS — G9340 Encephalopathy, unspecified: Secondary | ICD-10-CM | POA: Diagnosis not present

## 2021-01-19 DIAGNOSIS — I959 Hypotension, unspecified: Secondary | ICD-10-CM | POA: Diagnosis present

## 2021-01-19 DIAGNOSIS — Z789 Other specified health status: Secondary | ICD-10-CM

## 2021-01-19 DIAGNOSIS — F17200 Nicotine dependence, unspecified, uncomplicated: Secondary | ICD-10-CM | POA: Diagnosis present

## 2021-01-19 DIAGNOSIS — I6389 Other cerebral infarction: Secondary | ICD-10-CM | POA: Diagnosis not present

## 2021-01-19 DIAGNOSIS — I639 Cerebral infarction, unspecified: Secondary | ICD-10-CM | POA: Diagnosis not present

## 2021-01-19 DIAGNOSIS — L8990 Pressure ulcer of unspecified site, unspecified stage: Secondary | ICD-10-CM | POA: Insufficient documentation

## 2021-01-19 DIAGNOSIS — Z0189 Encounter for other specified special examinations: Secondary | ICD-10-CM

## 2021-01-19 DIAGNOSIS — I129 Hypertensive chronic kidney disease with stage 1 through stage 4 chronic kidney disease, or unspecified chronic kidney disease: Secondary | ICD-10-CM | POA: Diagnosis present

## 2021-01-19 DIAGNOSIS — E1151 Type 2 diabetes mellitus with diabetic peripheral angiopathy without gangrene: Secondary | ICD-10-CM | POA: Diagnosis present

## 2021-01-19 DIAGNOSIS — R1310 Dysphagia, unspecified: Secondary | ICD-10-CM | POA: Diagnosis not present

## 2021-01-19 DIAGNOSIS — L89811 Pressure ulcer of head, stage 1: Secondary | ICD-10-CM | POA: Diagnosis not present

## 2021-01-19 DIAGNOSIS — R509 Fever, unspecified: Secondary | ICD-10-CM

## 2021-01-19 DIAGNOSIS — R54 Age-related physical debility: Secondary | ICD-10-CM | POA: Diagnosis present

## 2021-01-19 DIAGNOSIS — Z6824 Body mass index (BMI) 24.0-24.9, adult: Secondary | ICD-10-CM

## 2021-01-19 DIAGNOSIS — E876 Hypokalemia: Secondary | ICD-10-CM | POA: Diagnosis present

## 2021-01-19 DIAGNOSIS — G40901 Epilepsy, unspecified, not intractable, with status epilepticus: Secondary | ICD-10-CM | POA: Diagnosis present

## 2021-01-19 DIAGNOSIS — J690 Pneumonitis due to inhalation of food and vomit: Secondary | ICD-10-CM | POA: Diagnosis not present

## 2021-01-19 DIAGNOSIS — I63411 Cerebral infarction due to embolism of right middle cerebral artery: Secondary | ICD-10-CM | POA: Diagnosis not present

## 2021-01-19 DIAGNOSIS — G458 Other transient cerebral ischemic attacks and related syndromes: Secondary | ICD-10-CM | POA: Diagnosis not present

## 2021-01-19 DIAGNOSIS — R4182 Altered mental status, unspecified: Secondary | ICD-10-CM | POA: Diagnosis not present

## 2021-01-19 DIAGNOSIS — K7689 Other specified diseases of liver: Secondary | ICD-10-CM | POA: Diagnosis not present

## 2021-01-19 DIAGNOSIS — G9389 Other specified disorders of brain: Secondary | ICD-10-CM | POA: Diagnosis not present

## 2021-01-19 DIAGNOSIS — I771 Stricture of artery: Secondary | ICD-10-CM | POA: Diagnosis not present

## 2021-01-19 DIAGNOSIS — J9690 Respiratory failure, unspecified, unspecified whether with hypoxia or hypercapnia: Secondary | ICD-10-CM | POA: Diagnosis not present

## 2021-01-19 DIAGNOSIS — R55 Syncope and collapse: Secondary | ICD-10-CM | POA: Diagnosis not present

## 2021-01-19 DIAGNOSIS — Z4682 Encounter for fitting and adjustment of non-vascular catheter: Secondary | ICD-10-CM | POA: Diagnosis not present

## 2021-01-19 DIAGNOSIS — I63231 Cerebral infarction due to unspecified occlusion or stenosis of right carotid arteries: Secondary | ICD-10-CM | POA: Diagnosis not present

## 2021-01-19 DIAGNOSIS — R0603 Acute respiratory distress: Secondary | ICD-10-CM | POA: Diagnosis not present

## 2021-01-19 DIAGNOSIS — R000 Tachycardia, unspecified: Secondary | ICD-10-CM | POA: Diagnosis not present

## 2021-01-19 DIAGNOSIS — R0689 Other abnormalities of breathing: Secondary | ICD-10-CM | POA: Diagnosis not present

## 2021-01-19 DIAGNOSIS — R0902 Hypoxemia: Secondary | ICD-10-CM | POA: Diagnosis not present

## 2021-01-19 DIAGNOSIS — Z9049 Acquired absence of other specified parts of digestive tract: Secondary | ICD-10-CM | POA: Diagnosis not present

## 2021-01-19 DIAGNOSIS — I6782 Cerebral ischemia: Secondary | ICD-10-CM | POA: Diagnosis not present

## 2021-01-19 DIAGNOSIS — I672 Cerebral atherosclerosis: Secondary | ICD-10-CM | POA: Diagnosis not present

## 2021-01-19 DIAGNOSIS — R404 Transient alteration of awareness: Secondary | ICD-10-CM | POA: Diagnosis not present

## 2021-01-19 DIAGNOSIS — R918 Other nonspecific abnormal finding of lung field: Secondary | ICD-10-CM | POA: Diagnosis not present

## 2021-01-19 DIAGNOSIS — R402431 Glasgow coma scale score 3-8, in the field [EMT or ambulance]: Secondary | ICD-10-CM | POA: Diagnosis not present

## 2021-01-19 DIAGNOSIS — J9811 Atelectasis: Secondary | ICD-10-CM | POA: Diagnosis not present

## 2021-01-19 DIAGNOSIS — I10 Essential (primary) hypertension: Secondary | ICD-10-CM | POA: Diagnosis not present

## 2021-01-19 DIAGNOSIS — G40909 Epilepsy, unspecified, not intractable, without status epilepticus: Secondary | ICD-10-CM | POA: Diagnosis not present

## 2021-01-19 DIAGNOSIS — J90 Pleural effusion, not elsewhere classified: Secondary | ICD-10-CM | POA: Diagnosis not present

## 2021-01-19 DIAGNOSIS — G319 Degenerative disease of nervous system, unspecified: Secondary | ICD-10-CM | POA: Diagnosis not present

## 2021-01-19 DIAGNOSIS — I6601 Occlusion and stenosis of right middle cerebral artery: Secondary | ICD-10-CM | POA: Diagnosis not present

## 2021-01-19 HISTORY — DX: Peripheral vascular disease, unspecified: I73.9

## 2021-01-19 HISTORY — DX: Calculus of kidney: N20.0

## 2021-01-19 HISTORY — DX: Unspecified atrial fibrillation: I48.91

## 2021-01-19 HISTORY — DX: Essential (primary) hypertension: I10

## 2021-01-19 HISTORY — DX: Gastro-esophageal reflux disease without esophagitis: K21.9

## 2021-01-19 HISTORY — DX: Cerebral infarction, unspecified: I63.9

## 2021-01-19 LAB — COMPREHENSIVE METABOLIC PANEL
ALT: 20 U/L (ref 0–44)
AST: 27 U/L (ref 15–41)
Albumin: 3.8 g/dL (ref 3.5–5.0)
Alkaline Phosphatase: 122 U/L (ref 38–126)
Anion gap: 15 (ref 5–15)
BUN: 7 mg/dL — ABNORMAL LOW (ref 8–23)
CO2: 19 mmol/L — ABNORMAL LOW (ref 22–32)
Calcium: 8.6 mg/dL — ABNORMAL LOW (ref 8.9–10.3)
Chloride: 101 mmol/L (ref 98–111)
Creatinine, Ser: 1.42 mg/dL — ABNORMAL HIGH (ref 0.61–1.24)
GFR, Estimated: 53 mL/min — ABNORMAL LOW (ref >60)
Glucose, Bld: 138 mg/dL — ABNORMAL HIGH (ref 70–99)
Potassium: 2.7 mmol/L — CL (ref 3.5–5.1)
Sodium: 135 mmol/L (ref 135–145)
Total Bilirubin: 0.7 mg/dL (ref 0.3–1.2)
Total Protein: 6.5 g/dL (ref 6.5–8.1)

## 2021-01-19 LAB — PREPARE FRESH FROZEN PLASMA
Unit division: 0
Unit division: 0
Unit division: 0
Unit division: 0
Unit division: 0
Unit division: 0
Unit division: 0
Unit division: 0

## 2021-01-19 LAB — URINALYSIS, ROUTINE W REFLEX MICROSCOPIC
Bilirubin Urine: NEGATIVE
Glucose, UA: NEGATIVE mg/dL
Ketones, ur: NEGATIVE mg/dL
Leukocytes,Ua: NEGATIVE
Nitrite: NEGATIVE
Protein, ur: 100 mg/dL — AB
Specific Gravity, Urine: 1.008 (ref 1.005–1.030)
pH: 5 (ref 5.0–8.0)

## 2021-01-19 LAB — BPAM RBC
Blood Product Expiration Date: 202206052359
Blood Product Expiration Date: 202206052359
Blood Product Expiration Date: 202206052359
Blood Product Expiration Date: 202206052359
ISSUE DATE / TIME: 202205061135
ISSUE DATE / TIME: 202205061135
ISSUE DATE / TIME: 202205061135
ISSUE DATE / TIME: 202205061135
Unit Type and Rh: 5100
Unit Type and Rh: 5100
Unit Type and Rh: 5100
Unit Type and Rh: 5100

## 2021-01-19 LAB — BPAM FFP
Blood Product Expiration Date: 202205082359
Blood Product Expiration Date: 202205102359
Blood Product Expiration Date: 202205212359
Blood Product Expiration Date: 202205242359
Blood Product Expiration Date: 202205252359
Blood Product Expiration Date: 202205252359
Blood Product Expiration Date: 202205252359
Blood Product Expiration Date: 202205262359
ISSUE DATE / TIME: 202205061135
ISSUE DATE / TIME: 202205061135
ISSUE DATE / TIME: 202205061135
ISSUE DATE / TIME: 202205061135
ISSUE DATE / TIME: 202205061140
ISSUE DATE / TIME: 202205061140
ISSUE DATE / TIME: 202205061140
ISSUE DATE / TIME: 202205061140
Unit Type and Rh: 6200
Unit Type and Rh: 6200
Unit Type and Rh: 6200
Unit Type and Rh: 6200
Unit Type and Rh: 6200
Unit Type and Rh: 6200
Unit Type and Rh: 6200
Unit Type and Rh: 6200

## 2021-01-19 LAB — I-STAT ARTERIAL BLOOD GAS, ED
Acid-Base Excess: 3 mmol/L — ABNORMAL HIGH (ref 0.0–2.0)
Bicarbonate: 26.5 mmol/L (ref 20.0–28.0)
Calcium, Ion: 1.1 mmol/L — ABNORMAL LOW (ref 1.15–1.40)
HCT: 43 % (ref 39.0–52.0)
Hemoglobin: 14.6 g/dL (ref 13.0–17.0)
O2 Saturation: 100 %
Potassium: 2.8 mmol/L — ABNORMAL LOW (ref 3.5–5.1)
Sodium: 138 mmol/L (ref 135–145)
TCO2: 28 mmol/L (ref 22–32)
pCO2 arterial: 37.8 mmHg (ref 32.0–48.0)
pH, Arterial: 7.453 — ABNORMAL HIGH (ref 7.350–7.450)
pO2, Arterial: 512 mmHg — ABNORMAL HIGH (ref 83.0–108.0)

## 2021-01-19 LAB — BASIC METABOLIC PANEL
Anion gap: 6 (ref 5–15)
BUN: <5 mg/dL — ABNORMAL LOW (ref 8–23)
CO2: 27 mmol/L (ref 22–32)
Calcium: 7.9 mg/dL — ABNORMAL LOW (ref 8.9–10.3)
Chloride: 104 mmol/L (ref 98–111)
Creatinine, Ser: 1.31 mg/dL — ABNORMAL HIGH (ref 0.61–1.24)
GFR, Estimated: 59 mL/min — ABNORMAL LOW (ref >60)
Glucose, Bld: 94 mg/dL (ref 70–99)
Potassium: 3.1 mmol/L — ABNORMAL LOW (ref 3.5–5.1)
Sodium: 137 mmol/L (ref 135–145)

## 2021-01-19 LAB — RESP PANEL BY RT-PCR (FLU A&B, COVID) ARPGX2
Influenza A by PCR: NEGATIVE
Influenza B by PCR: NEGATIVE
SARS Coronavirus 2 by RT PCR: NEGATIVE

## 2021-01-19 LAB — CBC WITH DIFFERENTIAL/PLATELET
Abs Immature Granulocytes: 0.02 10*3/uL (ref 0.00–0.07)
Basophils Absolute: 0 10*3/uL (ref 0.0–0.1)
Basophils Relative: 0 %
Eosinophils Absolute: 0.1 10*3/uL (ref 0.0–0.5)
Eosinophils Relative: 1 %
HCT: 47.9 % (ref 39.0–52.0)
Hemoglobin: 16.3 g/dL (ref 13.0–17.0)
Immature Granulocytes: 0 %
Lymphocytes Relative: 16 %
Lymphs Abs: 1.2 10*3/uL (ref 0.7–4.0)
MCH: 32.1 pg (ref 26.0–34.0)
MCHC: 34 g/dL (ref 30.0–36.0)
MCV: 94.3 fL (ref 80.0–100.0)
Monocytes Absolute: 0.3 10*3/uL (ref 0.1–1.0)
Monocytes Relative: 5 %
Neutro Abs: 5.5 10*3/uL (ref 1.7–7.7)
Neutrophils Relative %: 78 %
Platelets: 165 10*3/uL (ref 150–400)
RBC: 5.08 MIL/uL (ref 4.22–5.81)
RDW: 12.2 % (ref 11.5–15.5)
WBC: 7.2 10*3/uL (ref 4.0–10.5)
nRBC: 0 % (ref 0.0–0.2)

## 2021-01-19 LAB — TYPE AND SCREEN
Unit division: 0
Unit division: 0
Unit division: 0
Unit division: 0

## 2021-01-19 LAB — RAPID URINE DRUG SCREEN, HOSP PERFORMED
Amphetamines: NOT DETECTED
Barbiturates: NOT DETECTED
Benzodiazepines: POSITIVE — AB
Cocaine: NOT DETECTED
Opiates: NOT DETECTED
Tetrahydrocannabinol: NOT DETECTED

## 2021-01-19 LAB — ETHANOL: Alcohol, Ethyl (B): <10 mg/dL (ref <10)

## 2021-01-19 LAB — CK: Total CK: 290 U/L (ref 49–397)

## 2021-01-19 LAB — LACTIC ACID, PLASMA: Lactic Acid, Venous: 4.7 mmol/L (ref 0.5–1.9)

## 2021-01-19 LAB — MRSA PCR SCREENING: MRSA by PCR: NEGATIVE

## 2021-01-19 LAB — CBG MONITORING, ED: Glucose-Capillary: 145 mg/dL — ABNORMAL HIGH (ref 70–99)

## 2021-01-19 LAB — HIV ANTIBODY (ROUTINE TESTING W REFLEX): HIV Screen 4th Generation wRfx: NONREACTIVE

## 2021-01-19 MED ORDER — LEVETIRACETAM IN NACL 1000 MG/100ML IV SOLN
1000.0000 mg | Freq: Two times a day (BID) | INTRAVENOUS | Status: DC
  Administered 2021-01-20 – 2021-01-27 (×16): 1000 mg via INTRAVENOUS
  Filled 2021-01-19 (×16): qty 100

## 2021-01-19 MED ORDER — POTASSIUM CHLORIDE 20 MEQ PO PACK
40.0000 meq | PACK | Freq: Once | ORAL | Status: AC
  Administered 2021-01-19: 40 meq
  Filled 2021-01-19: qty 2

## 2021-01-19 MED ORDER — FENTANYL CITRATE (PF) 100 MCG/2ML IJ SOLN: 25.0000 ug | INTRAMUSCULAR | Status: DC | PRN

## 2021-01-19 MED ORDER — THIAMINE HCL 100 MG PO TABS
100.0000 mg | ORAL_TABLET | Freq: Every day | ORAL | Status: DC
  Administered 2021-01-19 – 2021-01-23 (×5): 100 mg
  Filled 2021-01-19 (×5): qty 1

## 2021-01-19 MED ORDER — ETOMIDATE 2 MG/ML IV SOLN
INTRAVENOUS | Status: AC | PRN
  Administered 2021-01-19: 20 mg via INTRAVENOUS

## 2021-01-19 MED ORDER — FENTANYL CITRATE (PF) 100 MCG/2ML IJ SOLN
INTRAMUSCULAR | Status: AC
  Filled 2021-01-19: qty 2

## 2021-01-19 MED ORDER — ROCURONIUM BROMIDE 50 MG/5ML IV SOLN
INTRAVENOUS | Status: AC | PRN
  Administered 2021-01-19: 50 mg via INTRAVENOUS

## 2021-01-19 MED ORDER — IPRATROPIUM-ALBUTEROL 0.5-2.5 (3) MG/3ML IN SOLN: 3.0000 mL | Freq: Four times a day (QID) | RESPIRATORY_TRACT | Status: DC | PRN

## 2021-01-19 MED ORDER — DOCUSATE SODIUM 100 MG PO CAPS: 100.0000 mg | ORAL_CAPSULE | Freq: Two times a day (BID) | ORAL | Status: DC | PRN

## 2021-01-19 MED ORDER — FOLIC ACID 1 MG PO TABS
1.0000 mg | ORAL_TABLET | Freq: Every day | ORAL | Status: DC
  Administered 2021-01-19 – 2021-02-02 (×13): 1 mg
  Filled 2021-01-19 (×14): qty 1

## 2021-01-19 MED ORDER — FENTANYL 2500MCG IN NS 250ML (10MCG/ML) PREMIX INFUSION: 25.0000 ug/h | INTRAVENOUS | Status: DC

## 2021-01-19 MED ORDER — PROPOFOL 1000 MG/100ML IV EMUL
5.0000 ug/kg/min | INTRAVENOUS | Status: DC
Start: 2021-01-19 — End: 2021-01-22
  Administered 2021-01-19: 20 ug/kg/min via INTRAVENOUS
  Administered 2021-01-20 (×2): 35 ug/kg/min via INTRAVENOUS
  Administered 2021-01-21: 15 ug/kg/min via INTRAVENOUS
  Filled 2021-01-19 (×2): qty 200
  Filled 2021-01-19 (×3): qty 100

## 2021-01-19 MED ORDER — LORAZEPAM 2 MG/ML IJ SOLN: 4.0000 mg | Freq: Once | INTRAMUSCULAR | Status: DC

## 2021-01-19 MED ORDER — ADULT MULTIVITAMIN LIQUID CH
15.0000 mL | Freq: Every day | ORAL | Status: DC
  Administered 2021-01-19 – 2021-01-24 (×6): 15 mL
  Filled 2021-01-19 (×8): qty 15

## 2021-01-19 MED ORDER — IPRATROPIUM-ALBUTEROL 0.5-2.5 (3) MG/3ML IN SOLN: 3.0000 mL | Freq: Four times a day (QID) | RESPIRATORY_TRACT | Status: DC

## 2021-01-19 MED ORDER — LORAZEPAM 2 MG/ML IJ SOLN
2.0000 mg | INTRAMUSCULAR | Status: DC | PRN
  Administered 2021-01-19 – 2021-01-31 (×7): 2 mg via INTRAVENOUS
  Filled 2021-01-19 (×6): qty 1

## 2021-01-19 MED ORDER — POTASSIUM CHLORIDE 10 MEQ/100ML IV SOLN
10.0000 meq | INTRAVENOUS | Status: AC
  Administered 2021-01-19 (×4): 10 meq via INTRAVENOUS
  Filled 2021-01-19 (×2): qty 100

## 2021-01-19 MED ORDER — HEPARIN SODIUM (PORCINE) 5000 UNIT/ML IJ SOLN
5000.0000 [IU] | Freq: Three times a day (TID) | INTRAMUSCULAR | Status: DC
  Administered 2021-01-19 – 2021-01-31 (×35): 5000 [IU] via SUBCUTANEOUS
  Filled 2021-01-19 (×32): qty 1

## 2021-01-19 MED ORDER — SODIUM CHLORIDE 0.9 % IV SOLN
2000.0000 mg | INTRAVENOUS | Status: AC
  Administered 2021-01-19: 2000 mg via INTRAVENOUS
  Filled 2021-01-19: qty 20

## 2021-01-19 MED ORDER — POLYETHYLENE GLYCOL 3350 17 G PO PACK
17.0000 g | PACK | Freq: Every day | ORAL | Status: DC | PRN
  Administered 2021-01-21: 17 g via ORAL
  Filled 2021-01-19: qty 1

## 2021-01-19 MED ORDER — LACTATED RINGERS IV BOLUS
1000.0000 mL | Freq: Once | INTRAVENOUS | Status: AC
  Administered 2021-01-19: 1000 mL via INTRAVENOUS

## 2021-01-19 MED ORDER — PANTOPRAZOLE SODIUM 40 MG IV SOLR
40.0000 mg | Freq: Every day | INTRAVENOUS | Status: DC
  Administered 2021-01-19: 40 mg via INTRAVENOUS

## 2021-01-19 MED ORDER — FENTANYL CITRATE (PF) 100 MCG/2ML IJ SOLN
25.0000 ug | INTRAMUSCULAR | Status: DC | PRN
  Administered 2021-01-19: 100 ug via INTRAVENOUS
  Administered 2021-01-19: 50 ug via INTRAVENOUS
  Administered 2021-01-19 – 2021-01-20 (×3): 100 ug via INTRAVENOUS
  Filled 2021-01-19 (×4): qty 2

## 2021-01-19 MED ORDER — SODIUM CHLORIDE 0.9 % IV SOLN
250.0000 mL | INTRAVENOUS | Status: DC
  Administered 2021-01-21 – 2021-01-29 (×5): 250 mL via INTRAVENOUS

## 2021-01-19 MED ORDER — PHENYLEPHRINE HCL-NACL 10-0.9 MG/250ML-% IV SOLN
25.0000 ug/min | INTRAVENOUS | Status: DC
  Administered 2021-01-19 – 2021-01-20 (×2): 25 ug/min via INTRAVENOUS
  Administered 2021-01-20: 65 ug/min via INTRAVENOUS
  Administered 2021-01-21: 15 ug/min via INTRAVENOUS
  Filled 2021-01-19: qty 750
  Filled 2021-01-19: qty 500
  Filled 2021-01-19: qty 750
  Filled 2021-01-19: qty 500

## 2021-01-19 NOTE — Progress Notes (Signed)
eLink Physician-Brief Progress Note Patient Name: Adam Mcclain DOB: 06-May-1951 MRN: [id number]   Date of Service  01/19/2021  HPI/Events of Note  Hypotension - BP drops with adequate level of sedation. No CVL or CVP.  eICU Interventions  Plan: 1. Phenylephrine IV infusion via PIV. Titrate to MAP >= 65.     Intervention Category Major Interventions: Hypotension - evaluation and management  [name] 01/19/2021, 11:02 PM

## 2021-01-19 NOTE — ED Triage Notes (Signed)
Pt BIB [facility] from parking lot. Pt was sitting in the car waiting on family member in doctors office. Bystander pulled up pt was looking to the left shaking unresponsive, called EMS. Pt unresponsive with EMS, EMS gave 10 of versed. Pt currently being bagged on arrival.

## 2021-01-19 NOTE — H&P (Signed)
NAME:  Adam Mcclain, MRN:  [id number], DOB:  1951-03-20, LOS: 0 ADMISSION DATE:  01/19/2021, CONSULTATION DATE: 5/6 REFERRING MD: Dr. Rubin Payor, CHIEF COMPLAINT: Seizure  History of Present Illness:  70 year old male with past medical history as below, which is significant for CVA, HTN, afibrillation on aspirin and Plavix, COPD, and CKD.  In the midday hours of 5/6 he had accompanied by family member to a doctor's appointment.  He was waiting in the car when a bystander walked by and noticed he had a gaze deviation and was shaking.  EMS was called.  Upon EMS arrival the patient was unresponsive and was administered 10 mg of Versed.  He was bagged in route to the emergency department where he was promptly intubated on presentation.  The ED physician did note some rigidity in the upper extremities concerning for seizure activity.  CT of the head was nonacute.  The patient was evaluated by neurology who provided a loading dose of Keppra and started EEG monitoring.  PCCM was asked to admit the patient due to the requirement of mechanical ventilation.  Pertinent  Medical History  Stroke, hypertension, GERD, atrial fibrillation, COPD, CKD.  Significant Hospital Events: Including procedures, antibiotic start and stop dates in addition to other pertinent events   . 5/6 admit to [facility] for seizures on the ventilator.  Interim History / Subjective:    Objective   Blood pressure (!) 147/117, pulse 91, resp. rate 18, SpO2 100 %.    Vent Mode: PRVC FiO2 (%):  [100 %] 100 % Set Rate:  [18 bmp] 18 bmp Vt Set:  [580 mL] 580 mL PEEP:  [5 cmH20] 5 cmH20 Plateau Pressure:  [14 cmH20] 14 cmH20  No intake or output data in the 24 hours ending 01/19/21 1311 There were no vitals filed for this visit.  Examination: General: Thin elderly appearing male on mechanical ventilator via endotracheal tube HENT: Normocephalic, atraumatic, PERRL, no appreciable JVD Lungs: Clear bilateral breath  sounds Cardiovascular: Regular rate and rhythm, no murmur appreciated Abdomen: Soft, non distended, normoactive bowel sounds Extremities: No acute deformity Neuro: Eyes open to painful stimuli. Flexing on the left upper, localizing in the right upper.  Labs/imaging that I have personally reviewed  (right click and "Reselect all SmartList Selections" daily)  CT head: No evidence of acute intracranial abnormality.  Similar encephalomalacia in the anterior lateral right temporal lobe and bilateral occipital nerves.  Moderate chronic microvascular ischemic disease.  Potassium 2.7, bicarb 19, glucose 138, creatinine 1.42, calcium 8.6 CBC within normal limits Alcohol negative  EEG with no epileptiform discharges  Resolved Hospital Problem list     Assessment & Plan:   Seizure: Intubated in the emergency department for protection purposes. No epileptiform discharges on EEG.  -Admit to ICU -EEG ongoing -Neurology following -Keppra 2 g given in the ED and continue per neurology recommendations -DC Wellbutrin as it will lower the seizure threshold  Acute hypoxemic respiratory failure in the setting of seizure -Full vent support -Propofol, as needed fentanyl for RASS goal of 0 to -1 -ABG, CXR as indicated -Wean FiO2 for oxygen saturation greater than 92%  Paroxysmal atrial fibrillation: on ASA/plavix HTN history - Telemetry monitoring - Holding home lisinopril/HCTZ for now  AKI CKD Hypokalemia 2.7 - Aggressive K supplementation. Repeat BMP later today - Holding HCTZ/ACE  COPD history documented. No associated home meds listed. No evidence of acute exacerbation.  - PRN Duonebs  GOC: There is in alternative medical record set for merge, which mentions  DNR status.I am unable to contact family to confirm so will keep him full code for now and continue attempts to contact his daughter.    Best practice (right click and "Reselect all SmartList Selections" daily)  Diet:   NPO Pain/Anxiety/Delirium protocol (if indicated): Yes (RASS goal -1,-2) VAP protocol (if indicated): Yes DVT prophylaxis: Subcutaneous Heparin GI prophylaxis: PPI Glucose control:  SSI No Central venous access:  N/A Arterial line:  N/A Foley:  N/A Mobility:  bed rest  PT consulted: N/A Last date of multidisciplinary goals of care discussion [ ]  Code Status:  full code Disposition: ICU  Labs   CBC: Recent Labs  Lab 01/19/21 1158 01/19/21 1244  WBC 7.2  --   NEUTROABS 5.5  --   HGB 16.3 14.6  HCT 47.9 43.0  MCV 94.3  --   PLT 165  --     Basic Metabolic Panel: Recent Labs  Lab 01/19/21 1158 01/19/21 1244  NA 135 138  K 2.7* 2.8*  CL 101  --   CO2 19*  --   GLUCOSE 138*  --   BUN 7*  --   CREATININE 1.42*  --   CALCIUM 8.6*  --    GFR: CrCl cannot be calculated (Unknown ideal weight.). Recent Labs  Lab 01/19/21 1158  WBC 7.2    Liver Function Tests: Recent Labs  Lab 01/19/21 1158  AST 27  ALT 20  ALKPHOS 122  BILITOT 0.7  PROT 6.5  ALBUMIN 3.8   No results for input(s): LIPASE, AMYLASE in the last 168 hours. No results for input(s): AMMONIA in the last 168 hours.  ABG    Component Value Date/Time   PHART 7.453 (H) 01/19/2021 1244   PCO2ART 37.8 01/19/2021 1244   PO2ART 512 (H) 01/19/2021 1244   HCO3 26.5 01/19/2021 1244   TCO2 28 01/19/2021 1244   O2SAT 100.0 01/19/2021 1244     Coagulation Profile: No results for input(s): INR, PROTIME in the last 168 hours.  Cardiac Enzymes: No results for input(s): CKTOTAL, CKMB, CKMBINDEX, TROPONINI in the last 168 hours.  HbA1C: No results found for: HGBA1C  CBG: Recent Labs  Lab 01/19/21 1144  GLUCAP 145*    Review of Systems:   Patient is encephalopathic and/or intubated. Therefore history has been obtained from chart review.    Past Medical History:  He,  has no past medical history on file.   Surgical History:  unknown   Social History:      Family History:  His family  history is not on file.   Allergies No Known Allergies   Home Medications  Prior to Admission medications   Not on File     Critical care time: 48 minutes     03/21/21, AGACNP-BC [facility]  See Amion for personal pager PCCM on call pager [phone] until 7pm. Please call Elink 7p-7a. [phone]  01/19/2021 1:45 PM

## 2021-01-19 NOTE — Progress Notes (Signed)
Clitical lab value was called for lactic acid of 4.7. MD aware and orders given for LR bolus. Will continue to monitor.

## 2021-01-19 NOTE — ED Provider Notes (Signed)
[facility] EMERGENCY DEPARTMENT Provider Note   CSN: [id number] Arrival date & time: 01/19/21  1141     History Chief Complaint  Patient presents with  . Seizures    Adam Mcclain is a 70 y.o. male.  HPI Level 5 caveat due to unresponsiveness.  Patient came in as a level 1 trauma.  Had been called a level 1 trauma due to a miscommunication.  Patient was found in a car, but seizing.  There has been no accident and he was parked in a parking lot.  CBG was little over 100.  Reported seizure history.  Patient however unresponsive and unable to provide history.  EMS attempted and intubation but was unable to after he reportedly sat up.  Head nasal and oral airway is in place.  Had 10 of Versed by EMS.    No past medical history on file.  Patient Active Problem List   Diagnosis Date Noted  . Seizure (HCC) 01/19/2021        No family history on file.     Home Medications Prior to Admission medications   Not on File    Allergies    Patient has no known allergies.  Review of Systems   Review of Systems  Unable to perform ROS: Patient unresponsive    Physical Exam Updated Vital Signs BP (!) 106/92   Pulse 100   Temp 97.7 F (36.5 C) (Axillary)   Resp 15   Wt 80 kg   SpO2 100%   Physical Exam Vitals and nursing note reviewed.  Constitutional:      Appearance: He is not diaphoretic.  HENT:     Head: Atraumatic.     Mouth/Throat:     Mouth: Mucous membranes are moist.     Comments: Oral and nasal airway is in place. Eyes:     Comments: Pupils constricted and nonresponsive.  No threat.  Cardiovascular:     Rate and Rhythm: Regular rhythm.  Pulmonary:     Breath sounds: No wheezing or rhonchi.     Comments: Being bagged but breathing spontaneously. Abdominal:     Tenderness: There is no abdominal tenderness.  Musculoskeletal:     Cervical back: Neck supple.  Skin:    General: Skin is warm.  Neurological:     Comments: Unresponsive.   No response to pain.  Breathing spontaneously.  Reportedly set up for EMS.  Did have some repetitive stiff motions of arms and legs.     ED Results / Procedures / Treatments   Labs (all labs ordered are listed, but only abnormal results are displayed) Labs Reviewed  COMPREHENSIVE METABOLIC PANEL - Abnormal; Notable for the following components:      Result Value   Potassium 2.7 (*)    CO2 19 (*)    Glucose, Bld 138 (*)    BUN 7 (*)    Creatinine, Ser 1.42 (*)    Calcium 8.6 (*)    GFR, Estimated 53 (*)    All other components within normal limits  RAPID URINE DRUG SCREEN, HOSP PERFORMED - Abnormal; Notable for the following components:   Benzodiazepines POSITIVE (*)    All other components within normal limits  URINALYSIS, ROUTINE W REFLEX MICROSCOPIC - Abnormal; Notable for the following components:   Color, Urine STRAW (*)    Hgb urine dipstick MODERATE (*)    Protein, ur 100 (*)    Bacteria, UA RARE (*)    All other components within normal limits  CBG MONITORING, ED - Abnormal; Notable for the following components:   Glucose-Capillary 145 (*)    All other components within normal limits  I-STAT ARTERIAL BLOOD GAS, ED - Abnormal; Notable for the following components:   pH, Arterial 7.453 (*)    pO2, Arterial 512 (*)    Acid-Base Excess 3.0 (*)    Potassium 2.8 (*)    Calcium, Ion 1.10 (*)    All other components within normal limits  RESP PANEL BY RT-PCR (FLU A&B, COVID) ARPGX2  ETHANOL  CBC WITH DIFFERENTIAL/PLATELET  BLOOD GAS, ARTERIAL  HIV ANTIBODY (ROUTINE TESTING W REFLEX)  BASIC METABOLIC PANEL  LACTIC ACID, PLASMA  TYPE AND SCREEN  PREPARE FRESH FROZEN PLASMA    EKG None  Radiology CT Head Wo Contrast  Result Date: 01/19/2021 CLINICAL DATA:  Found down.  Possible seizure. EXAM: CT HEAD WITHOUT CONTRAST TECHNIQUE: Contiguous axial images were obtained from the base of the skull through the vertex without intravenous contrast. COMPARISON:  CT head December 30, 2020. FINDINGS: Brain: Similar encephalomalacia in the anterolateral right temporal lobe, and bilateral occipital lobes. Similar small remote lacunar infarct versus dilated perivascular space in the left basal ganglia. No evidence of acute large vascular territory infarct. Moderate patchy white matter hypodensities, similar and likely related to chronic microvascular ischemic disease. No acute hemorrhage. Similar ex vacuo ventricular dilation without hydrocephalus. No visible extra-axial fluid collection. No mass lesion or abnormal mass effect. Vascular: No hyperdense vessel.  Calcific atherosclerosis. Skull: No acute fracture. Sinuses/Orbits: Sinuses are largely clear. No acute orbital abnormality. Other: No mastoid effusions. Debris in bilateral external auditory canals, presumably cerumen. Fluid layering in the pharynx, likely related to intubation. IMPRESSION: 1. No evidence of acute intracranial abnormality. 2. Similar encephalomalacia in the anterolateral right temporal lobe and bilateral occipital lobes. 3. Moderate chronic microvascular ischemic disease. Electronically Signed   By: Feliberto Harts MD   On: 01/19/2021 12:53   DG Chest Portable 1 View  Result Date: 01/19/2021 CLINICAL DATA:  Unresponsive. EXAM: PORTABLE CHEST 1 VIEW COMPARISON:  Chest x-ray dated December 30, 2020. FINDINGS: Endotracheal tube in place with the tip 4.7 cm above the carina. Enteric tube in the stomach. The heart size and mediastinal contours are within normal limits. Normal pulmonary vascularity. No focal consolidation, pleural effusion, or pneumothorax. No acute osseous abnormality. Prior cholecystectomy. IMPRESSION: 1. Appropriately positioned endotracheal and enteric tubes. 2. No active disease. Electronically Signed   By: Obie Dredge M.D.   On: 01/19/2021 12:30   EEG adult  Result Date: 01/19/2021 Charlsie Quest, MD     01/19/2021  1:30 PM Patient Name: Adam Mcclain MRN: [id number] Epilepsy Attending: Charlsie Quest Referring Physician/Provider: Dr Caryl Pina Date: 01/19/2021 Duration: 27.02 mins Patient history: 70 year old male presenting with GTC seizures. EEG to evaluate for seizure Level of alertness:  lethargic AEDs during EEG study: LEV, Versed Technical aspects: This EEG study was done with scalp electrodes positioned according to the 10-20 International system of electrode placement. Electrical activity was acquired at a sampling rate of 500Hz  and reviewed with a high frequency filter of 70Hz  and a low frequency filter of 1Hz . EEG data were recorded continuously and digitally stored. Description: EEG showed continuous generalized polymorphic sharply contoured 3 to 6 Hz theta-delta slowing. There is an excessive amount of 15 to 18 Hz beta activity with irregular morphology distributed symmetrically and diffusely. Hyperventilation and photic stimulation were not performed.   ABNORMALITY - Continuous slow, generalized - Excessive  beta, generalized IMPRESSION: This study is suggestive of moderate diffuse encephalopathy, nonspecific etiology. The excessive beta activity seen in the background is most likely due to the effect of benzodiazepine and is a benign EEG pattern. No seizures or epileptiform discharges were seen throughout the recording. Charlsie Quest    Procedures Procedure Name: Intubation Date/Time: 01/19/2021 11:45 AM Performed by: Benjiman Core, MD Pre-anesthesia Checklist: Patient identified Oxygen Delivery Method: Ambu bag Preoxygenation: Pre-oxygenation with 100% oxygen Induction Type: Rapid sequence Ventilation: Mask ventilation without difficulty Laryngoscope Size: Glidescope and 3 Tube type: Subglottic suction tube Tube size: 7.5 mm Number of attempts: 1 Tube secured with: ETT holder Dental Injury: Teeth and Oropharynx as per pre-operative assessment         Medications Ordered in ED Medications  levETIRAcetam (KEPPRA) IVPB 1000 mg/100 mL premix (has no administration  in time range)  docusate sodium (COLACE) capsule 100 mg (has no administration in time range)  polyethylene glycol (MIRALAX / GLYCOLAX) packet 17 g (has no administration in time range)  heparin injection 5,000 Units (5,000 Units Subcutaneous Given 01/19/21 1327)  pantoprazole (PROTONIX) injection 40 mg (has no administration in time range)  propofol (DIPRIVAN) 1000 MG/100ML infusion (20 mcg/kg/min  80 kg Intravenous New Bag/Given 01/19/21 1319)  fentaNYL (SUBLIMAZE) injection 25 mcg (has no administration in time range)  fentaNYL (SUBLIMAZE) injection 25-100 mcg (100 mcg Intravenous Given 01/19/21 1321)  potassium chloride (KLOR-CON) packet 40 mEq (0 mEq Per Tube Hold 01/19/21 1331)  potassium chloride 10 mEq in 100 mL IVPB (10 mEq Intravenous New Bag/Given 01/19/21 1330)  fentaNYL (SUBLIMAZE) 100 MCG/2ML injection (has no administration in time range)  LORazepam (ATIVAN) injection 4 mg (has no administration in time range)  ipratropium-albuterol (DUONEB) 0.5-2.5 (3) MG/3ML nebulizer solution 3 mL (has no administration in time range)  etomidate (AMIDATE) injection (20 mg Intravenous Given 01/19/21 1144)  rocuronium (ZEMURON) injection (50 mg Intravenous Given 01/19/21 1144)  levETIRAcetam (KEPPRA) 2,000 mg in sodium chloride 0.9 % 250 mL IVPB (0 mg Intravenous Stopped 01/19/21 1313)    ED Course  I have reviewed the triage vital signs and the nursing notes.  Pertinent labs & imaging results that were available during my care of the patient were reviewed by me and considered in my medical decision making (see chart for details).    MDM Rules/Calculators/A&P                          Patient presented with reported seizure activity.  EMS had reported seizure history but reviewing chart I do not see previous seizures although he has had a previous stroke.  Came in unresponsive.  Had been given 10 of Versed.  Intubated by myself and loaded with Keppra.  Chest x-ray to verify tube placement.  Neurology  notified and EEG done.  Patient had spot EEG done and did have some of the repetitive activity without seizure activity seen.  Admit to ICU.  No fever.  Doubt sepsis.  CRITICAL CARE Performed by: Benjiman Core Total critical care time: 30 minutes Critical care time was exclusive of separately billable procedures and treating other patients. Critical care was necessary to treat or prevent imminent or life-threatening deterioration. Critical care was time spent personally by me on the following activities: development of treatment plan with patient and/or surrogate as well as nursing, discussions with consultants, evaluation of patient's response to treatment, examination of patient, obtaining history from patient or surrogate, ordering and performing treatments and interventions,  ordering and review of laboratory studies, ordering and review of radiographic studies, pulse oximetry and re-evaluation of patient's condition.  Final Clinical Impression(s) / ED Diagnoses Final diagnoses:  None    Rx / DC Orders ED Discharge Orders    None       Benjiman Core, MD 01/19/21 1349

## 2021-01-19 NOTE — Progress Notes (Signed)
Patient brother Brett Canales was notified of patient condition. All questions answered and location was shared.

## 2021-01-19 NOTE — Progress Notes (Signed)
EEG completed, results pending. 

## 2021-01-19 NOTE — Progress Notes (Signed)
Orthopedic Tech Progress Note Patient Details:  Adam Mcclain 1951/04/27 [id number] Level 2 trauma  Patient ID: [name], male   DOB: Jan 20, 1951, 70 y.o.   MRN: [id number]   [name] 01/19/2021, 12:11 PM

## 2021-01-19 NOTE — Consult Note (Addendum)
NEURO HOSPITALIST CONSULT NOTE   Requestig physician: Dr. Rubin Payor  Reason for Consult: Status epilepticus  History obtained from:  EMS and Chart     HPI:                                                                                                                                          Adam KELEMEN is an 70 y.o. male with a PMHx of CVA, HTN, atrial fibrillation on aspirin and Plavix, COPD, and CKD who was brought in by EMS from a parking lot where he was found to be shaking. The patient had been sitting in a car waiting on a family member who was in a doctors office. A bystander pulled up and noticed that the patient was looking to the left while shaking and was unresponsive. EMS was called and patient continued to be unresponsive when they arrived. The patient was administered 10 mg Versed in the field. He was being bagged on arrival to the ED and was emergently intubated. EMS stated that they were told the patient has a history of seizures, but no history of such can be found on review of outpatient clinic note in Epic.    No family history on file.            Social History:  has no history on file for tobacco use, alcohol use, and drug use.  No Known Allergies  MEDICATIONS:                                                                                                                       ROS:  Unable to obtain as patient is intubated, s/p recent paralytic administration and unresponsive.   Blood pressure (!) 157/101, pulse 90, resp. rate 16, SpO2 100 %.   General Examination:                                                                                                       Physical Exam  HEENT-  [place]/AT.  Lungs- Intubated Extremities- No edema  Neurological Examination Mental Status: Note - exam is performed while  patient is intubated, s/p recent paralytic administration and 10 mg Versed given by EMS GCS 3t. Unresponsive in the context of sedation and recent paralytic.  Cranial Nerves: II: Sluggishly reactive pupils, 2-3 mm, symmetric and round. No blink to threat III,IV, VI: Eyes are closed. No doll's eye response. Eyes are midline. No nystagmus.  V,VII: Face is flaccidly symmetric VIII: No response to auditory stimuli IX,X: Intubated XI: Head is midline XII: Unable to assess Motor: Flaccid tone x 4 with no movement status post paralytic administration Sensory: No reaction to noxious Deep Tendon Reflexes: Uninformative s/p paralytic Plantars: Mute bilaterally  Cerebellar/Gait: Unable to assess    Lab Results: Basic Metabolic Panel: No results for input(s): NA, K, CL, CO2, GLUCOSE, BUN, CREATININE, CALCIUM, MG, PHOS in the last 168 hours.  CBC: No results for input(s): WBC, NEUTROABS, HGB, HCT, MCV, PLT in the last 168 hours.  Cardiac Enzymes: No results for input(s): CKTOTAL, CKMB, CKMBINDEX, TROPONINI in the last 168 hours.  Lipid Panel: No results for input(s): CHOL, TRIG, HDL, CHOLHDL, VLDL, LDLCALC in the last 168 hours.  Imaging: No results found.   Assessment: 70 year old male presenting with GTC seizures. Received 10 mg Versed with EMS and has been loaded with 2000 mg Keppra in the ED. Now intubated. Uncertain if the patient has a history of seizures or not.  1. Exam is consistent with recent paralytic administration in conjunction with Versed sedation.  2. CT head: No evidence of acute intracranial abnormality. Similar encephalomalacia in the anterolateral right temporal lobe and bilateral occipital lobes. Moderate chronic microvascular ischemic disease. 3. Hypokalemia and elevated Cr. Ionized calcium level is low. Lactate 4.7. Normal WBC. Utox negative for cocaine, opiates and amphetamines.    Recommendations: 1. Continue Keppra at 1000 mg IV BID (ordered) 2. STAT EEG  (ordered) 3. CIWA protocol for possible EtOH withdrawal 4. Frequent neuro checks 5. MRI brain when stable.  6. Inpatient seizure precautions 7. Will need to discuss with patient outpatient seizure precautions prior to discharge.   Addendum: EEG: This study is suggestive of moderate diffuse encephalopathy, nonspecific to etiology. The excessive beta activity seen in the background is most likely due to the effect of benzodiazepine and is a benign EEG pattern. No seizures or epileptiform discharges were seen throughout the recording. A spell of shivering was captured without electrographic correlate.   Addendum (9:40 PM): Patient on low-dose propofol. Bucking the vent and exhibiting purposeful movements with lowered drip rate. No shivering, jerking or other seizure-like activity noted.    Electronically signed: Dr. Caryl Pina 01/19/2021, 12:03 PM

## 2021-01-19 NOTE — Procedures (Signed)
Patient Name: Adam Mcclain  MRN: [id number]  Epilepsy Attending: Charlsie Quest  Referring Physician/Provider: Dr Caryl Pina Date: 01/19/2021 Duration: 27.02 mins  Patient history: 70 year old male presenting with GTC seizures. EEG to evaluate for seizure  Level of alertness:  lethargic  AEDs during EEG study: LEV, Versed  Technical aspects: This EEG study was done with scalp electrodes positioned according to the 10-20 International system of electrode placement. Electrical activity was acquired at a sampling rate of 500Hz  and reviewed with a high frequency filter of 70Hz  and a low frequency filter of 1Hz . EEG data were recorded continuously and digitally stored.   Description: EEG showed continuous generalized polymorphic sharply contoured 3 to 6 Hz theta-delta slowing. There is an excessive amount of 15 to 18 Hz beta activity with irregular morphology distributed symmetrically and diffusely. Hyperventilation and photic stimulation were not performed.     ABNORMALITY - Continuous slow, generalized - Excessive beta, generalized  IMPRESSION: This study is suggestive of moderate diffuse encephalopathy, nonspecific etiology. The excessive beta activity seen in the background is most likely due to the effect of benzodiazepine and is a benign EEG pattern. No seizures or epileptiform discharges were seen throughout the recording.   Lis Savitt 

## 2021-01-20 ENCOUNTER — Inpatient Hospital Stay (HOSPITAL_COMMUNITY): Payer: Medicare Other

## 2021-01-20 DIAGNOSIS — R569 Unspecified convulsions: Secondary | ICD-10-CM | POA: Diagnosis not present

## 2021-01-20 DIAGNOSIS — R6883 Chills (without fever): Secondary | ICD-10-CM | POA: Diagnosis not present

## 2021-01-20 DIAGNOSIS — J9601 Acute respiratory failure with hypoxia: Secondary | ICD-10-CM | POA: Diagnosis not present

## 2021-01-20 LAB — BASIC METABOLIC PANEL
Anion gap: 6 (ref 5–15)
BUN: 5 mg/dL — ABNORMAL LOW (ref 8–23)
CO2: 23 mmol/L (ref 22–32)
Calcium: 8.3 mg/dL — ABNORMAL LOW (ref 8.9–10.3)
Chloride: 111 mmol/L (ref 98–111)
Creatinine, Ser: 1.32 mg/dL — ABNORMAL HIGH (ref 0.61–1.24)
GFR, Estimated: 58 mL/min — ABNORMAL LOW (ref >60)
Glucose, Bld: 95 mg/dL (ref 70–99)
Potassium: 3.7 mmol/L (ref 3.5–5.1)
Sodium: 140 mmol/L (ref 135–145)

## 2021-01-20 LAB — GLUCOSE, CAPILLARY
Glucose-Capillary: 121 mg/dL — ABNORMAL HIGH (ref 70–99)
Glucose-Capillary: 82 mg/dL (ref 70–99)
Glucose-Capillary: 105 mg/dL — ABNORMAL HIGH (ref 70–99)

## 2021-01-20 LAB — CBC
HCT: 42.3 % (ref 39.0–52.0)
Hemoglobin: 14.4 g/dL (ref 13.0–17.0)
MCH: 31.9 pg (ref 26.0–34.0)
MCHC: 34 g/dL (ref 30.0–36.0)
MCV: 93.6 fL (ref 80.0–100.0)
Platelets: 190 10*3/uL (ref 150–400)
RBC: 4.52 MIL/uL (ref 4.22–5.81)
RDW: 12.6 % (ref 11.5–15.5)
WBC: 11 10*3/uL — ABNORMAL HIGH (ref 4.0–10.5)
nRBC: 0 % (ref 0.0–0.2)

## 2021-01-20 LAB — PHOSPHORUS
Phosphorus: <1 mg/dL — CL (ref 2.5–4.6)
Phosphorus: 2.6 mg/dL (ref 2.5–4.6)
Phosphorus: 2.9 mg/dL (ref 2.5–4.6)

## 2021-01-20 LAB — LACTIC ACID, PLASMA: Lactic Acid, Venous: 3.2 mmol/L (ref 0.5–1.9)

## 2021-01-20 LAB — MAGNESIUM
Magnesium: 1.7 mg/dL (ref 1.7–2.4)
Magnesium: 2.1 mg/dL (ref 1.7–2.4)

## 2021-01-20 LAB — TRIGLYCERIDES: Triglycerides: 192 mg/dL — ABNORMAL HIGH (ref <150)

## 2021-01-20 MED ORDER — PANTOPRAZOLE SODIUM 40 MG PO PACK
40.0000 mg | PACK | Freq: Every day | ORAL | Status: DC
  Administered 2021-01-20 – 2021-02-02 (×12): 40 mg
  Filled 2021-01-20 (×12): qty 20

## 2021-01-20 MED ORDER — CHLORHEXIDINE GLUCONATE 0.12% ORAL RINSE (MEDLINE KIT)
15.0000 mL | Freq: Two times a day (BID) | OROMUCOSAL | Status: DC
  Administered 2021-01-20 – 2021-02-02 (×27): 15 mL via OROMUCOSAL

## 2021-01-20 MED ORDER — CHLORHEXIDINE GLUCONATE CLOTH 2 % EX PADS
6.0000 | MEDICATED_PAD | Freq: Every day | CUTANEOUS | Status: DC
  Administered 2021-01-21 – 2021-02-02 (×13): 6 via TOPICAL

## 2021-01-20 MED ORDER — MAGNESIUM SULFATE 2 GM/50ML IV SOLN
2.0000 g | Freq: Once | INTRAVENOUS | Status: AC
  Administered 2021-01-20: 2 g via INTRAVENOUS
  Filled 2021-01-20: qty 50

## 2021-01-20 MED ORDER — PROSOURCE TF PO LIQD
45.0000 mL | Freq: Three times a day (TID) | ORAL | Status: DC
  Administered 2021-01-20 – 2021-01-25 (×15): 45 mL
  Filled 2021-01-20 (×15): qty 45

## 2021-01-20 MED ORDER — VITAL AF 1.2 CAL PO LIQD
1000.0000 mL | ORAL | Status: DC
  Administered 2021-01-20 – 2021-01-24 (×5): 1000 mL

## 2021-01-20 MED ORDER — SODIUM PHOSPHATES 45 MMOLE/15ML IV SOLN: 30.0000 mmol | Freq: Once | INTRAVENOUS | Status: DC

## 2021-01-20 MED ORDER — ORAL CARE MOUTH RINSE
15.0000 mL | OROMUCOSAL | Status: DC
  Administered 2021-01-20 – 2021-02-02 (×129): 15 mL via OROMUCOSAL

## 2021-01-20 MED ORDER — POTASSIUM PHOSPHATES 15 MMOLE/5ML IV SOLN
30.0000 mmol | Freq: Once | INTRAVENOUS | Status: AC
  Administered 2021-01-20: 30 mmol via INTRAVENOUS
  Filled 2021-01-20: qty 10

## 2021-01-20 NOTE — Progress Notes (Signed)
RT NOTE: patient currently getting placed on EEG.  Not breathing over set RR.  Will hold on SBT this AM.  Tolerating current ventilator settings well at this time.  Will continue to monitor.

## 2021-01-20 NOTE — Progress Notes (Addendum)
Patient with severe hypophosphatemia. CCM has been notified.   Given hypophosphatemia, underlying alcohol overuse is suspected. His new onset seizure activity may have been due to EtOH withdrawal, hypophosphatemia, or a combination of both. His old strokes, as seen on CT, may also serve as seizure onset zones.   Addendum: Per RN, brother at bedside stated that the patient does not drink EtOH or use illicit substances.   Electronically signed: Dr. Caryl Pina

## 2021-01-20 NOTE — Progress Notes (Signed)
Pharmacy Electrolyte Replacement  Recent Labs:  Recent Labs    01/20/21 0443  K 3.7  MG 1.7  PHOS <1.0*  CREATININE 1.32*    Low Critical Values (K </= 2.5, Phos </= 1, Mg </= 1) Present: Phos = <1  MD Contacted: Celine Mans, N  Plan: CCM already repleted with KPhos IV x 1 Mag sulfate 2g IV x 1 Repeat Phos this evening, bmet/mag in AM   Leia Alf, PharmD, BCPS Please check AMION for all [facility] contact numbers Clinical Pharmacist 01/20/2021 11:18 AM

## 2021-01-20 NOTE — Progress Notes (Signed)
LTM EEG in process. Result pending

## 2021-01-20 NOTE — Progress Notes (Signed)
NAME:  Adam Mcclain, MRN:  [id number], DOB:  [birth year], LOS: 1 ADMISSION DATE:  01/19/2021, CONSULTATION DATE: 5/6 REFERRING MD: Dr. Rubin Payor, CHIEF COMPLAINT: Seizure  History of Present Illness:  70 year old male with past medical history as below, which is significant for CVA, HTN, afibrillation on aspirin and Plavix, COPD, and CKD.  In the midday hours of 5/6 he had accompanied by family member to a doctor's appointment.  He was waiting in the car when a bystander walked by and noticed he had a gaze deviation and was shaking.  EMS was called.  Upon EMS arrival the patient was unresponsive and was administered 10 mg of Versed.  He was bagged in route to the emergency department where he was promptly intubated on presentation.  The ED physician did note some rigidity in the upper extremities concerning for seizure activity.  CT of the head was nonacute.  The patient was evaluated by neurology who provided a loading dose of Keppra and started EEG monitoring.  PCCM was asked to admit the patient due to the requirement of mechanical ventilation.  Pertinent  Medical History  Stroke, hypertension, GERD, atrial fibrillation, COPD, CKD.  Significant Hospital Events: Including procedures, antibiotic start and stop dates in addition to other pertinent events   . 5/6 admit to [facility] for seizures on the ventilator.  Interim History / Subjective:  Overnight having shivering, propofol increased. hyp  Objective   Blood pressure (!) 155/81, pulse 72, temperature 99.6 F (37.6 C), temperature source Axillary, resp. rate 18, weight 80 kg, SpO2 100 %.    Vent Mode: PRVC FiO2 (%):  [40 %-100 %] 40 % Set Rate:  [18 bmp] 18 bmp Vt Set:  [580 mL] 580 mL PEEP:  [5 cmH20] 5 cmH20 Plateau Pressure:  [14 cmH20-38 cmH20] 16 cmH20   Intake/Output Summary (Last 24 hours) at 01/20/2021 1111 Last data filed at 01/20/2021 1000 Gross per 24 hour  Intake 3164.75 ml  Output 2950 ml  Net 214.75 ml   Filed  Weights   01/19/21 1302  Weight: 80 kg    Examination: General: Thin elderly appearing male on mechanical ventilator via endotracheal tube HENT: EEG leads in place Lungs: Clear bilateral breath sounds Cardiovascular: RRR no mrg Abdomen: Soft, non distended, normoactive bowel sounds Extremities: No acute deformity Neuro: not responsive, not following commands, withdraws to painful stim  Labs/imaging that I have personally reviewed  (right click and "Reselect all SmartList Selections" daily)  CT head: No evidence of acute intracranial abnormality.  Similar encephalomalacia in the anterior lateral right temporal lobe and bilateral occipital nerves.  Moderate chronic microvascular ischemic disease.  Phos<1 Na 140, K 3.7, Cr 1.32 WBC 11, Hgb 14.4  Telemetry leads have significant rhythmic artifact suggesting ongoing shivering?  Resolved Hospital Problem list     Assessment & Plan:   New onset Seizure:  Intubated in the emergency department for protection purposes. No epileptiform discharges on EEG.  - continue continuous EEG, will await read - contnue Keppra, propofol -Neurology following -DC Wellbutrin as it will lower the seizure threshold - concern for etoh mediated seizures given profound hypophosphatemia - replacing aggressively  Acute hypoxemic respiratory failure in the setting of seizure -Full vent support, not appropriate for SAT/SBT at this time -Propofol, as needed fentanyl for RASS goal of 0 to -1 -ABG, CXR as indicated -Wean FiO2 for oxygen saturation greater than 92%  Paroxysmal atrial fibrillation: on ASA/plavix HTN history - Telemetry monitoring - Holding home lisinopril/HCTZ for now  AKI CKD Hypophosphatemia - Holding HCTZ/ACE - repeat phos level tonight, will replace as needed  COPD history documented. No associated home meds listed. No evidence of acute exacerbation.  - PRN Duonebs  GOC: There is in alternative medical record set for merge, which  mentions DNR status.I am unable to contact family to confirm so will keep him full code for now and continue attempts to contact his daughter.  Will follow up with family and obtain collateral information today. Brother will be here later today.  Best practice (right click and "Reselect all SmartList Selections" daily)  Diet:  NPO Pain/Anxiety/Delirium protocol (if indicated): Yes (RASS goal -1,-2) VAP protocol (if indicated): Yes DVT prophylaxis: Subcutaneous Heparin GI prophylaxis: PPI Glucose control:  SSI No Central venous access:  N/A Arterial line:  N/A Foley:  N/A Mobility:  bed rest  PT consulted: N/A Last date of multidisciplinary goals of care discussion [ ]  Code Status:  full code Disposition: ICU  The patient is critically ill due to respiratory failure.  Critical care was necessary to treat or prevent imminent or life-threatening deterioration.  Critical care was time spent personally by me on the following activities: development of treatment plan with patient and/or surrogate as well as nursing, discussions with consultants, evaluation of patient's response to treatment, examination of patient, obtaining history from patient or surrogate, ordering and performing treatments and interventions, ordering and review of laboratory studies, ordering and review of radiographic studies, pulse oximetry, re-evaluation of patient's condition and participation in multidisciplinary rounds.   Critical Care Time devoted to patient care services described in this note is 35 minutes. This time reflects time of care of this signee . This critical care time does not reflect separately billable procedures or procedure time, teaching time or supervisory time of PA/NP/Med student/Med Resident etc but could involve care discussion time.       Charlott Holler [facility] 01/20/2021 11:11 AM  Pager: see AMION  If no response to pager , please call  critical care on call (see AMION) until 7pm After 7:00 pm call Elink      Labs   CBC: Recent Labs  Lab 01/19/21 1158 01/19/21 1244 01/20/21 0443  WBC 7.2  --  11.0*  NEUTROABS 5.5  --   --   HGB 16.3 14.6 14.4  HCT 47.9 43.0 42.3  MCV 94.3  --  93.6  PLT 165  --  190    Basic Metabolic Panel: Recent Labs  Lab 01/19/21 1158 01/19/21 1244 01/19/21 2031 01/20/21 0443  NA 135 138 137 140  K 2.7* 2.8* 3.1* 3.7  CL 101  --  104 111  CO2 19*  --  27 23  GLUCOSE 138*  --  94 95  BUN 7*  --  <5* 5*  CREATININE 1.42*  --  1.31* 1.32*  CALCIUM 8.6*  --  7.9* 8.3*  MG  --   --   --  1.7  PHOS  --   --   --  <1.0*   GFR: CrCl cannot be calculated (Unknown ideal weight.). Recent Labs  Lab 01/19/21 1158 01/19/21 1452 01/20/21 0443  WBC 7.2  --  11.0*  LATICACIDVEN  --  4.7*  --     Liver Function Tests: Recent Labs  Lab 01/19/21 1158  AST 27  ALT 20  ALKPHOS 122  BILITOT 0.7  PROT 6.5  ALBUMIN 3.8   No results for input(s): LIPASE, AMYLASE in  the last 168 hours. No results for input(s): AMMONIA in the last 168 hours.  ABG    Component Value Date/Time   PHART 7.453 (H) 01/19/2021 1244   PCO2ART 37.8 01/19/2021 1244   PO2ART 512 (H) 01/19/2021 1244   HCO3 26.5 01/19/2021 1244   TCO2 28 01/19/2021 1244   O2SAT 100.0 01/19/2021 1244     Coagulation Profile: No results for input(s): INR, PROTIME in the last 168 hours.  Cardiac Enzymes: Recent Labs  Lab 01/19/21 1452  CKTOTAL 290

## 2021-01-20 NOTE — Progress Notes (Signed)
Called by RN - generalized shivering noted at 0630-given 2mg  Ativan IV - no response. Recommended 2mg  Ativan IV x1 again. Increase Propofol to 35 mcg/kg/min (on 25 for now) LTM EEG - IF shows sz,  next step  - Load with Fosphey 20mg /kg EEG order placed - technologists should be starting shift soon - will have them prioritize this EEG Neurology will follow  -- , MD Neurologist [facility] Pager: [phone]

## 2021-01-20 NOTE — Progress Notes (Signed)
Initial Nutrition Assessment  DOCUMENTATION CODES:   Not applicable  INTERVENTION:  Tube Feeding via OG:  Vital 1.2 AF at 55 ml/hr Pro-Source TF 45 mL TID  Provides 132 g of protein, 1704 kcals, 1069 mL of free water  TF regimen and propofol at current rate providing 2148 total kcal/day   NUTRITION DIAGNOSIS:   Inadequate oral intake related to acute illness as evidenced by NPO status.   GOAL:   Patient will meet greater than or equal to 90% of their needs  MONITOR:   Vent status,TF tolerance,Weight trends,Labs  REASON FOR ASSESSMENT:   Ventilator,Consult Enteral/tube feeding initiation and management  ASSESSMENT:   70 yo male admitted with new onset seizures, acute respiratory failure requiring intubation.PMH includes stroke, HTN, GERD, afib, COPD, CKD  Patient is currently intubated on ventilator support, sedated, requiring phenylephrine MV: 10.4 L/min Temp (24hrs), Avg:98.9 F (37.2 C), Min:97.7 F (36.5 C), Max:99.6 F (37.6 C)  Propofol: 16.8 ml/hr  OG tube with tip in stomach per abd xray  Unable to obtain diet and weight history from patient at this time  No height or previous weight encounters. Current wt 80 kg.    Labs: phosphorous <1.0 (L) Meds: liquid MVI,KCL, potassium phosphate, thiamine  Diet Order:   Diet Order            Diet NPO time specified  Diet effective now                 EDUCATION NEEDS:   Not appropriate for education at this time  Skin:  Skin Assessment: Reviewed RN Assessment  Last BM:  PTA  Height:   Ht Readings from Last 1 Encounters:  No data found for Ht    Weight:   Wt Readings from Last 1 Encounters:  01/19/21 80 kg     BMI:  There is no height or weight on file to calculate BMI.  Estimated Nutritional Needs:   Kcal:  2000-2200 kcala  Protein:  120-140 g  Fluid:  >/= 2L   Romelle Starcher MS, RDN, LDN, CNSC Registered Dietitian III Clinical Nutrition RD Pager and On-Call Pager Number  Located in [place]

## 2021-01-20 NOTE — Progress Notes (Signed)
Patient has been on continuous EEG monitor since this morning. Notified Dr. Otelia Limes to discuss decreasing propofol gtt. Okay to titrate to RASS goal at this time. Order updated.

## 2021-01-20 NOTE — Progress Notes (Signed)
Neurology Progress Note  Brief HPI: 70 y.o. male with PMHx of CVA, HTN, AF on aspirin and clopidogrel, COPD, and CKD who was brought in by EMS after bystanders witnessed the unresponsive patient looking towards his left while shaking. En route, he was given 10mg  Versed and emergently intubated in the ED where it was reported that he has a history of seizures but without supportive documented evidence of seizure history per chart review.   Subjective: Overnight concern for ongoing seizure activity versus shivering s/p 2 mg IV Ativan x 2 without response.  LTM EEG initiated for ongoing monitoring Currently on Propofol- increased from 25 mcg/kg/min to 35 mcg/kg/min following Ativan administration. No evidence of shivering versus seizure activity on examination this morning.  Exam: Vitals:   01/20/21 0745 01/20/21 0800  BP: 108/68 (!) 141/78  Pulse: 67 64  Resp: 18 18  Temp:    SpO2: 100% 100%   Gen: In bed, intubated and sedated Resp: non-labored breathing, no respiratory distress Abd: soft, non-tender  Neuro: Mental Status: Intubated and sedated. Sedation not paused for examination due to ongoing concern for seizure activity. Patient does not open eyes or move extremities to stimuli. Unable to verbalize due to condition- intubated and sedated. He is unable to provide history of present illness.  Cranial Nerves: Pupils 3 mm --> 2 mm, equal, round, and reactive to light bilaterally, patient does not open eyes or track examiner, cough elicited via in-line suctioning, gag intact, no spontaneous respirations over set ventilator rate. Unable to assess other cranial nerves 2/2 patient condition: intubated and heavily sedated. Motor: Extremities flaccid- patient heavily sedated on Propofol and a total of 4mg  IV Ativan this morning at approximately 06:30. No movement of extremities to noxious stimuli. Bulk is normal.Tone is increased throughout with increased tone of lower extremities compared to  upper extremities. Sensory: Unable to assess due to patient condition: intubated and sedated DTR: 3+ and symmetric patellae and biceps without clonus Plantars: Mute bilaterally  Pertinent Labs: CBC    Component Value Date/Time   WBC 11.0 (H) 01/20/2021 0443   RBC 4.52 01/20/2021 0443   HGB 14.4 01/20/2021 0443   HCT 42.3 01/20/2021 0443   PLT 190 01/20/2021 0443   MCV 93.6 01/20/2021 0443   MCH 31.9 01/20/2021 0443   MCHC 34.0 01/20/2021 0443   RDW 12.6 01/20/2021 0443   LYMPHSABS 1.2 01/19/2021 1158   MONOABS 0.3 01/19/2021 1158   EOSABS 0.1 01/19/2021 1158   BASOSABS 0.0 01/19/2021 1158   CMP     Component Value Date/Time   NA 140 01/20/2021 0443   K 3.7 01/20/2021 0443   CL 111 01/20/2021 0443   CO2 23 01/20/2021 0443   GLUCOSE 95 01/20/2021 0443   BUN 5 (L) 01/20/2021 0443   CREATININE 1.32 (H) 01/20/2021 0443   CALCIUM 8.3 (L) 01/20/2021 0443   PROT 6.5 01/19/2021 1158   ALBUMIN 3.8 01/19/2021 1158   AST 27 01/19/2021 1158   ALT 20 01/19/2021 1158   ALKPHOS 122 01/19/2021 1158   BILITOT 0.7 01/19/2021 1158   GFRNONAA 58 (L) 01/20/2021 0443   Lab Results  Component Value Date   CALCIUM 8.3 (L) 01/20/2021   PHOS <1.0 (LL) 01/20/2021  Magnesium: 1.7  Lactic Acid, Venous    Component Value Date/Time   LATICACIDVEN 4.7 (HH) 01/19/2021 1452   Drugs of Abuse     Component Value Date/Time   LABOPIA NONE DETECTED 01/19/2021 1237   COCAINSCRNUR NONE DETECTED 01/19/2021 1237   LABBENZ  POSITIVE (A) 01/19/2021 1237   AMPHETMU NONE DETECTED 01/19/2021 1237   THCU NONE DETECTED 01/19/2021 1237   LABBARB NONE DETECTED 01/19/2021 1237    Urinalysis    Component Value Date/Time   COLORURINE STRAW (A) 01/19/2021 1210   APPEARANCEUR CLEAR 01/19/2021 1210   LABSPEC 1.008 01/19/2021 1210   PHURINE 5.0 01/19/2021 1210   GLUCOSEU NEGATIVE 01/19/2021 1210   HGBUR MODERATE (A) 01/19/2021 1210   BILIRUBINUR NEGATIVE 01/19/2021 1210   KETONESUR NEGATIVE 01/19/2021  1210   PROTEINUR 100 (A) 01/19/2021 1210   NITRITE NEGATIVE 01/19/2021 1210   LEUKOCYTESUR NEGATIVE 01/19/2021 1210   Imaging Reviewed: EEG 5/6: "This study is suggestive of moderate diffuse encephalopathy, nonspecific etiology. The excessive beta activity seen in the background is most likely due to the effect of benzodiazepine and is a benign EEG pattern. No seizures or epileptiform discharges were seen throughout the recording."  CT Head 5/6: 1. No evidence of acute intracranial abnormality. 2. Similar encephalomalacia in the anterolateral right temporal lobe and bilateral occipital lobes. 3. Moderate chronic microvascular ischemic disease.  Impression: 70 year old male presenting with GTC seizures. Received 10 mg Versed with EMS and was loaded with 2000 mg Keppra in the ED. Now intubated. Uncertain if the patient has a history of seizures or not. Continued seizures versus shivering in the ICU.  1. Exam is consistent with heavy sedation: on propofol gtt, s/p total of 4mg  IV Ativan this morning. Flaccid extremities without shivering or myoclonic movements. PERRL, cough and gag reflexes remain intact, hyperactive reflexes throughout without clonus, and increased tone throughout. 2. CT head: No evidence of acute intracranial abnormality. Similar encephalomalacia in the anterolateral right temporal lobe and bilateral occipital lobes. Moderate chronic microvascular ischemic disease. 3. Elevated creatinine, phosphorous < 1.0, magnesium 1.7, calcium 8.3, lactic acid elevation 4.7- likely reactive. Initially with hypokalemia with potassium 2.7, corrected to 3.7 this morning with replacement. 4. EEG 5/6 without seizures or epileptiform discharges. EEG suggestive of moderate diffuse encephalopathy with excessive background beta activity likely secondary to benzodiazepine use. Generalized shivering noted overnight, LTM EEG initiated for further evaluation of shivering versus seizure  activity.  Recommendations: - LTM EEG initiated - Frequent neuro checks - Continue Keppra 1,000 mg IV BID - CIWA protocol for possible ETOH withdrawal - MRI brain when able  - 2mg  Ativan IV PRN for any seizure lasting > 5 minutes - Inpatient seizure precautions - For ongoing seizures on LTM plan to load with fosphenytoin 20 mg/kg - Appreciate management of metabolic derangements per CCM  , AGACNP-BC [facility] [phone]  Electronically signed: Dr. Lanae Boast

## 2021-01-21 ENCOUNTER — Inpatient Hospital Stay (HOSPITAL_COMMUNITY): Payer: Medicare Other

## 2021-01-21 DIAGNOSIS — E876 Hypokalemia: Secondary | ICD-10-CM | POA: Diagnosis not present

## 2021-01-21 DIAGNOSIS — G9340 Encephalopathy, unspecified: Secondary | ICD-10-CM | POA: Diagnosis not present

## 2021-01-21 DIAGNOSIS — J9601 Acute respiratory failure with hypoxia: Secondary | ICD-10-CM | POA: Diagnosis not present

## 2021-01-21 DIAGNOSIS — R4182 Altered mental status, unspecified: Secondary | ICD-10-CM | POA: Diagnosis not present

## 2021-01-21 DIAGNOSIS — R569 Unspecified convulsions: Secondary | ICD-10-CM | POA: Diagnosis not present

## 2021-01-21 LAB — CBC WITH DIFFERENTIAL/PLATELET
Abs Immature Granulocytes: 0.09 10*3/uL — ABNORMAL HIGH (ref 0.00–0.07)
Basophils Absolute: 0 10*3/uL (ref 0.0–0.1)
Basophils Relative: 0 %
Eosinophils Absolute: 0 10*3/uL (ref 0.0–0.5)
Eosinophils Relative: 0 %
HCT: 34 % — ABNORMAL LOW (ref 39.0–52.0)
Hemoglobin: 11.3 g/dL — ABNORMAL LOW (ref 13.0–17.0)
Immature Granulocytes: 1 %
Lymphocytes Relative: 7 %
Lymphs Abs: 0.9 10*3/uL (ref 0.7–4.0)
MCH: 32.4 pg (ref 26.0–34.0)
MCHC: 33.2 g/dL (ref 30.0–36.0)
MCV: 97.4 fL (ref 80.0–100.0)
Monocytes Absolute: 0.7 10*3/uL (ref 0.1–1.0)
Monocytes Relative: 6 %
Neutro Abs: 10.5 10*3/uL — ABNORMAL HIGH (ref 1.7–7.7)
Neutrophils Relative %: 86 %
Platelets: 125 10*3/uL — ABNORMAL LOW (ref 150–400)
RBC: 3.49 MIL/uL — ABNORMAL LOW (ref 4.22–5.81)
RDW: 13.2 % (ref 11.5–15.5)
WBC: 12.2 10*3/uL — ABNORMAL HIGH (ref 4.0–10.5)
nRBC: 0 % (ref 0.0–0.2)

## 2021-01-21 LAB — BASIC METABOLIC PANEL
Anion gap: 7 (ref 5–15)
BUN: 15 mg/dL (ref 8–23)
CO2: 21 mmol/L — ABNORMAL LOW (ref 22–32)
Calcium: 7.5 mg/dL — ABNORMAL LOW (ref 8.9–10.3)
Chloride: 112 mmol/L — ABNORMAL HIGH (ref 98–111)
Creatinine, Ser: 1.51 mg/dL — ABNORMAL HIGH (ref 0.61–1.24)
GFR, Estimated: 49 mL/min — ABNORMAL LOW (ref >60)
Glucose, Bld: 123 mg/dL — ABNORMAL HIGH (ref 70–99)
Potassium: 3.1 mmol/L — ABNORMAL LOW (ref 3.5–5.1)
Sodium: 140 mmol/L (ref 135–145)

## 2021-01-21 LAB — PHOSPHORUS
Phosphorus: 2.8 mg/dL (ref 2.5–4.6)
Phosphorus: 1.9 mg/dL — ABNORMAL LOW (ref 2.5–4.6)

## 2021-01-21 LAB — MAGNESIUM
Magnesium: 2.2 mg/dL (ref 1.7–2.4)
Magnesium: 2.3 mg/dL (ref 1.7–2.4)

## 2021-01-21 LAB — GLUCOSE, CAPILLARY
Glucose-Capillary: 106 mg/dL — ABNORMAL HIGH (ref 70–99)
Glucose-Capillary: 124 mg/dL — ABNORMAL HIGH (ref 70–99)
Glucose-Capillary: 93 mg/dL (ref 70–99)
Glucose-Capillary: 86 mg/dL (ref 70–99)

## 2021-01-21 MED ORDER — POTASSIUM CHLORIDE 20 MEQ PO PACK
40.0000 meq | PACK | ORAL | Status: AC
  Administered 2021-01-21 (×2): 40 meq
  Filled 2021-01-21 (×2): qty 2

## 2021-01-21 MED ORDER — ACETAMINOPHEN 325 MG PO TABS
650.0000 mg | ORAL_TABLET | Freq: Four times a day (QID) | ORAL | Status: DC | PRN
  Administered 2021-01-21 – 2021-01-31 (×4): 650 mg
  Filled 2021-01-21 (×4): qty 2

## 2021-01-21 MED ORDER — DOCUSATE SODIUM 50 MG/5ML PO LIQD: 100.0000 mg | Freq: Two times a day (BID) | ORAL | Status: DC | PRN

## 2021-01-21 MED ORDER — ACETAMINOPHEN 160 MG/5ML PO SOLN
650.0000 mg | Freq: Once | ORAL | Status: AC
  Administered 2021-01-21: 650 mg
  Filled 2021-01-21: qty 20.3

## 2021-01-21 MED ORDER — POLYETHYLENE GLYCOL 3350 17 G PO PACK: 17.0000 g | PACK | Freq: Every day | ORAL | Status: DC | PRN

## 2021-01-21 NOTE — Progress Notes (Signed)
eLink Physician-Brief Progress Note Patient Name: Adam Mcclain DOB: March 31, 1951 MRN: [id number]   Date of Service  01/21/2021  HPI/Events of Note  Fever to 101.6 F - No obvious source. Nurse states that urine looks clear. CXR on 01/19/2021 without evidence of pneumonia. Last WBC = 11.0. No old central lines to D/C.  eICU Interventions  Plan: 1. CBC with platelets at 5 AM. 2. Portable CXR at 5 AM. 3. Tylenol liquid 650 mg per tube X 1 now. 4. Defer further cultures to PCCM rounding team.     Intervention Category Major Interventions: Other:  Lenell Antu 01/21/2021, 12:48 AM

## 2021-01-21 NOTE — Progress Notes (Signed)
cEEG reviewed -generalized slowing. No seizures on prelim review.  Milon Dikes MD Neurology

## 2021-01-21 NOTE — Progress Notes (Signed)
Dr. Celine Mans notified concerning Hypertension and temp of 102.8 (O).  Tylenol ordered, Propofol stopped 1hr ago, plan to watch blood pressure and see if it will go down once Tylenol given.

## 2021-01-21 NOTE — Progress Notes (Signed)
Patients brother called for update, information shared after confirmation of password.

## 2021-01-21 NOTE — Plan of Care (Signed)
  Problem: Safety: Goal: Non-violent Restraint(s) Outcome: Not Progressing   Problem: Education: Goal: Knowledge of General Education information will improve Description: Including pain rating scale, medication(s)/side effects and non-pharmacologic comfort measures Outcome: Not Progressing   Problem: Health Behavior/Discharge Planning: Goal: Ability to manage health-related needs will improve Outcome: Not Progressing   Problem: Clinical Measurements: Goal: Ability to maintain clinical measurements within normal limits will improve Outcome: Progressing Goal: Will remain free from infection Outcome: Progressing Note: Pt remains unresponsive at this time, weaning propofol  Goal: Diagnostic test results will improve Outcome: Progressing Goal: Respiratory complications will improve Outcome: Progressing Goal: Cardiovascular complication will be avoided Outcome: Progressing   Problem: Activity: Goal: Risk for activity intolerance will decrease Outcome: Progressing   Problem: Nutrition: Goal: Adequate nutrition will be maintained Outcome: Progressing Note: Pt on tube feeds via feeding tube   Problem: Coping: Goal: Level of anxiety will decrease Outcome: Progressing   Problem: Elimination: Goal: Will not experience complications related to bowel motility Outcome: Progressing Goal: Will not experience complications related to urinary retention Outcome: Progressing   Problem: Pain Managment: Goal: General experience of comfort will improve Outcome: Progressing

## 2021-01-21 NOTE — Progress Notes (Signed)
Neurology Progress Note  Subjective: No acute overnight events Propofol weaned with LTM evidence of somewhat improved encephalopathy No evidence of seizures or epileptiform discharges overnight  Exam: Vitals:   01/21/21 1345 01/21/21 1400  BP: (!) 157/69 (!) 147/70  Pulse: 87 82  Resp: 18 18  Temp:    SpO2: 100% 100%   Gen: In bed, intubated and sedated Resp: ETT in place with mechanical ventilation, no respirations over set vent rate Abd: soft, non-tender  Neuro: Mental Status: Intubated and sedated. Patient with brief eye opening with noxious stimuli with minimal withdrawal x 4 extremities with noxious stimuli. Unable to verbalize due to condition- intubated and sedated. He is unable to provide history of present illness.  Cranial Nerves: Pupils 3 mm --> 2 mm, equal, round, and reactive to light bilaterally, patient does not fixate or track examiner or other stimuli with brief eye opening, cough elicited via in-line suctioning, gag intact, no spontaneous respirations over set ventilator rate. Unable to assess other cranial nerves 2/2 patient condition: intubated and sedated. Motor: Minimal withdrawal of all extremities x 4 with application of noxious stimuli. Bulk is normal.Tone is mildly increased throughout with increased tone of lower extremities compared to upper extremities. Sensory: Patient withdraws upper and lower extremities equally (minimally) with application of noxious stimuli DTR: 3+ and symmetric patellae and biceps without clonus Gait: Deferred  Imaging Reviewed: EEG 5/6: "This study is suggestive of moderate diffuse encephalopathy, nonspecific etiology.The excessive beta activity seen in the background is most likely due to the effect of benzodiazepine and is a benign EEG pattern. No seizures or epileptiform discharges were seen throughout the recording."  EEG 5/8: This study was initially suggestive of profound diffuse encephalopathy likely due to sedation. As  propofol was weaned, eeg was suggestive of moderate to severe diffuse encephalopathy, nonspecific etiology. No seizures or epileptiform discharges were seen throughout the recording.  CT Head 5/6: 1. No evidence of acute intracranial abnormality. 2. Similar encephalomalacia in the anterolateral right temporal lobe and bilateral occipital lobes. 3. Moderate chronic microvascular ischemic disease.  Assessment: 70 year old male presenting with GTC seizures. Received 10 mg Versed with EMS and was loaded with 2000 mg Keppra in the ED. Intubated in the ED and admitted to the ICU. Unclear if history of seizures. Continued seizures versus shivering in the ICU.  1. Examis consistent with continued sedation but improved since yesterday: on propofol gtt at 15 mcg/kg/min. Patient opens eyes briefly to noxious stimulation with bilateral withdrawal of extremities x 4 with noxious stimulation. Brainstem reflexes remain intact.  2. CT head:No evidence of acute intracranial abnormality. Similar encephalomalacia in the anterolateral right temporal lobe and bilateral occipital lobes. Moderate chronic microvascular ischemic disease. 3. Improvement in most metabolic derangements creatinine, lactic acid 4.7 --> 3.2, phosphorous <1 --> 2.8, magnesium 1.7 --> 2.2, and creatinine with some improvement. Patient remains hypocalcemic 8.3 --> 7.5 4. LTM EEG 5/7-5/8 with initial portions of the tracings showing profound diffuse encephalopathy likely due to sedation. As propofol was weaned, eeg was suggestive of moderate to severe diffuse encephalopathy, nonspecific to etiology. No seizures or epileptiform discharges were seen throughout the recording.    Recommendations: - LTM EEG monitoring; continue for tonight. Once patient is weaned off propofol sedation, can discontinue LTM if there are no electrographic seizures while off sedation.  - Frequent neuro checks - Continue Keppra 1,000 mg IV BID - CIWA protocol for  possible ETOH withdrawal - MRI brain when able (not ordered and will defer to  CCM on when to order given that he is intubated)  - 2mg  Ativan IV PRN for any seizure lasting > 5 minutes - Inpatient seizure precautions - Continue holding Wellbutrin as it lowers the seizure threshold - Appreciate management of metabolic derangements per CCM - Continue to wean off of sedation as tolerated - Neurology will continue to follow  , AGACNP-BC [facility] [phone]  Electronically signed: Dr. [phone]

## 2021-01-21 NOTE — Progress Notes (Signed)
NAME:  Adam Mcclain, MRN:  [id number], DOB:  July 14, 1951, LOS: 2 ADMISSION DATE:  01/19/2021, CONSULTATION DATE: 5/6 REFERRING MD: Dr. Rubin Payor, CHIEF COMPLAINT: Seizure  History of Present Illness:  70 year old male with past medical history as below, which is significant for CVA, HTN, afibrillation on aspirin and Plavix, COPD, and CKD.  In the midday hours of 5/6 he had accompanied by family member to a doctor's appointment.  He was waiting in the car when a bystander walked by and noticed he had a gaze deviation and was shaking.  EMS was called.  Upon EMS arrival the patient was unresponsive and was administered 10 mg of Versed.  He was bagged in route to the emergency department where he was promptly intubated on presentation.  The ED physician did note some rigidity in the upper extremities concerning for seizure activity.  CT of the head was nonacute.  The patient was evaluated by neurology who provided a loading dose of Keppra and started EEG monitoring.  PCCM was asked to admit the patient due to the requirement of mechanical ventilation.  Pertinent  Medical History  Stroke, hypertension, GERD, atrial fibrillation, COPD, CKD.  Significant Hospital Events: Including procedures, antibiotic start and stop dates in addition to other pertinent events   . 5/6 admit to [facility] for seizures on the ventilator. . 5/7 EEG shows no seizure activity  Interim History / Subjective:  Overnight had some titration of propofol and neosynephrine. Febrile to 101.6, now downtrending.   Objective   Blood pressure (!) 80/51, pulse 72, temperature 100.2 F (37.9 C), temperature source Oral, resp. rate 18, weight 62.8 kg, SpO2 100 %.    Vent Mode: PRVC FiO2 (%):  [30 %-40 %] 30 % Set Rate:  [18 bmp] 18 bmp Vt Set:  [580 mL] 580 mL PEEP:  [5 cmH20] 5 cmH20 Plateau Pressure:  [16 cmH20-22 cmH20] 17 cmH20   Intake/Output Summary (Last 24 hours) at 01/21/2021 0856 Last data filed at 01/21/2021 0800 Gross  per 24 hour  Intake 2892.76 ml  Output 1125 ml  Net 1767.76 ml   Filed Weights   01/19/21 1302 01/21/21 0500  Weight: 80 kg 62.8 kg    Examination: General: Thin elderly appearing male on mechanical ventilator via endotracheal tube HENT: EEG leads in place Lungs: Clear bilateral breath sounds Cardiovascular: RRR no mrg Abdomen: Soft, non distended, normoactive bowel sounds Extremities: No acute deformity Neuro: not responsive, not following commands, withdraws to painful stim in bilateral lower extremities  Labs/imaging that I have personally reviewed  (right click and "Reselect all SmartList Selections" daily)  Na 140 K 3.1 Cl 112 Cr 1.51 WBC 12.2 Hgb 11.3  Resolved Hospital Problem list     Assessment & Plan:   New onset Seizure:  Intubated in the emergency department for protection purposes. No epileptiform discharges on EEG.  - continue continuous EEG shows no further seizure like activity. - continue Keppra, propofol -Neurology following -holding wellbutrin  Acute hypoxemic respiratory failure in the setting of seizure -Full vent support, not appropriate for SAT/SBT at this time -Propofol, as needed fentanyl for RASS goal of 0 to -1 -ABG, CXR as indicated -Wean FiO2 for oxygen saturation greater than 92% - titrate down propofol this morning to assess neuro status better.  Paroxysmal atrial fibrillation: on ASA/plavix HTN history - Telemetry monitoring - in NSR for now - Holding home lisinopril/HCTZ for now  AKI CKD Hypokalemia - Holding HCTZ/ACE - replacing with KCl today 80 meq  COPD  history documented. No associated home meds listed. No evidence of acute exacerbation.  - PRN Duonebs  Best practice (right click and "Reselect all SmartList Selections" daily)  Diet:  NPO Pain/Anxiety/Delirium protocol (if indicated): Yes (RASS goal -1,-2) VAP protocol (if indicated): Yes DVT prophylaxis: Subcutaneous Heparin GI prophylaxis: PPI Glucose control:   SSI No Central venous access:  N/A Arterial line:  N/A Foley:  N/A Mobility:  bed rest  PT consulted: N/A Last date of multidisciplinary goals of care discussion [ ]  Code Status:  full code Disposition: ICU  The patient is critically ill due to respiratory failure, encephalopathy, acute stroke.  Critical care was necessary to treat or prevent imminent or life-threatening deterioration.  Critical care was time spent personally by me on the following activities: development of treatment plan with patient and/or surrogate as well as nursing, discussions with consultants, evaluation of patient's response to treatment, examination of patient, obtaining history from patient or surrogate, ordering and performing treatments and interventions, ordering and review of laboratory studies, ordering and review of radiographic studies, pulse oximetry, re-evaluation of patient's condition and participation in multidisciplinary rounds.   Critical Care Time devoted to patient care services described in this note is 31 minutes. This time reflects time of care of this signee . This critical care time does not reflect separately billable procedures or procedure time, teaching time or supervisory time of PA/NP/Med student/Med Resident etc but could involve care discussion time.       Charlott Holler [facility] 01/21/2021 8:56 AM  Pager: see AMION  If no response to pager , please call critical care on call (see AMION) until 7pm After 7:00 pm call Elink      Labs   CBC: Recent Labs  Lab 01/19/21 1158 01/19/21 1244 01/20/21 0443 01/21/21 0549  WBC 7.2  --  11.0* 12.2*  NEUTROABS 5.5  --   --  10.5*  HGB 16.3 14.6 14.4 11.3*  HCT 47.9 43.0 42.3 34.0*  MCV 94.3  --  93.6 97.4  PLT 165  --  190 125*    Basic Metabolic Panel: Recent Labs  Lab 01/19/21 1158 01/19/21 1244 01/19/21 2031 01/20/21 0443 01/20/21 1629 01/20/21 2025 01/21/21 0549  NA 135  138 137 140  --   --  140  K 2.7* 2.8* 3.1* 3.7  --   --  3.1*  CL 101  --  104 111  --   --  112*  CO2 19*  --  27 23  --   --  21*  GLUCOSE 138*  --  94 95  --   --  123*  BUN 7*  --  <5* 5*  --   --  15  CREATININE 1.42*  --  1.31* 1.32*  --   --  1.51*  CALCIUM 8.6*  --  7.9* 8.3*  --   --  7.5*  MG  --   --   --  1.7 2.1  --  2.2  PHOS  --   --   --  <1.0* 2.6 2.9 2.8   GFR: CrCl cannot be calculated (Unknown ideal weight.). Recent Labs  Lab 01/19/21 1158 01/19/21 1452 01/20/21 0443 01/20/21 1059 01/21/21 0549  WBC 7.2  --  11.0*  --  12.2*  LATICACIDVEN  --  4.7*  --  3.2*  --     Liver Function Tests: Recent Labs  Lab 01/19/21 1158  AST 27  ALT  20  ALKPHOS 122  BILITOT 0.7  PROT 6.5  ALBUMIN 3.8   No results for input(s): LIPASE, AMYLASE in the last 168 hours. No results for input(s): AMMONIA in the last 168 hours.  ABG    Component Value Date/Time   PHART 7.453 (H) 01/19/2021 1244   PCO2ART 37.8 01/19/2021 1244   PO2ART 512 (H) 01/19/2021 1244   HCO3 26.5 01/19/2021 1244   TCO2 28 01/19/2021 1244   O2SAT 100.0 01/19/2021 1244     Coagulation Profile: No results for input(s): INR, PROTIME in the last 168 hours.  Cardiac Enzymes: Recent Labs  Lab 01/19/21 1452  CKTOTAL 290

## 2021-01-21 NOTE — Procedures (Addendum)
Patient Name: Adam Mcclain  MRN: [id number]  Epilepsy Attending: Charlsie Quest  Referring Physician/Provider: Dr Caryl Pina Duration: 01/20/2021 0809 to 01/21/2021 0809  Patient history: 70 year old male presenting with GTC seizures. EEG to evaluate for seizure  Level of alertness:  comatose/sedated  AEDs during EEG study: LEV, Versed, propofol  Technical aspects: This EEG study was done with scalp electrodes positioned according to the 10-20 International system of electrode placement. Electrical activity was acquired at a sampling rate of 500Hz  and reviewed with a high frequency filter of 70Hz  and a low frequency filter of 1Hz . EEG data were recorded continuously and digitally stored.   Description: EEG initially showed burst suppression pattern with bursts of low amplitude 15-18hz  beta activity admixed with 5-7hz  theta slowing lasting 1-2 seconds alternating with generalized suppression lasting 2-3 seconds.Gradually, as propofol was weaned off around 1730 on 01/20/2021, eeg showed continuous generalized polymorphic sharply contoured 3 to 6 Hz theta-delta slowing at times with triphasic morphology. Hyperventilation and photic stimulation were not performed.     ABNORMALITY - Burst suppression, generalized - Continuous slow, generalized  IMPRESSION: This study was initially suggestive of profound diffuse encephalopathy likely due to sedation. As propofol was weaned, eeg was suggestive of moderate to severe diffuse encephalopathy, nonspecific etiology. No seizures or epileptiform discharges were seen throughout the recording.   Jamilette Suchocki 

## 2021-01-22 ENCOUNTER — Inpatient Hospital Stay (HOSPITAL_COMMUNITY): Payer: Medicare Other

## 2021-01-22 DIAGNOSIS — R569 Unspecified convulsions: Secondary | ICD-10-CM | POA: Diagnosis not present

## 2021-01-22 DIAGNOSIS — J9601 Acute respiratory failure with hypoxia: Secondary | ICD-10-CM | POA: Diagnosis not present

## 2021-01-22 LAB — BASIC METABOLIC PANEL
Anion gap: 6 (ref 5–15)
BUN: 18 mg/dL (ref 8–23)
CO2: 21 mmol/L — ABNORMAL LOW (ref 22–32)
Calcium: 8.3 mg/dL — ABNORMAL LOW (ref 8.9–10.3)
Chloride: 113 mmol/L — ABNORMAL HIGH (ref 98–111)
Creatinine, Ser: 1.28 mg/dL — ABNORMAL HIGH (ref 0.61–1.24)
GFR, Estimated: >60 mL/min (ref >60)
Glucose, Bld: 175 mg/dL — ABNORMAL HIGH (ref 70–99)
Potassium: 3.8 mmol/L (ref 3.5–5.1)
Sodium: 140 mmol/L (ref 135–145)

## 2021-01-22 LAB — GLUCOSE, CAPILLARY
Glucose-Capillary: 125 mg/dL — ABNORMAL HIGH (ref 70–99)
Glucose-Capillary: 129 mg/dL — ABNORMAL HIGH (ref 70–99)
Glucose-Capillary: 140 mg/dL — ABNORMAL HIGH (ref 70–99)
Glucose-Capillary: 141 mg/dL — ABNORMAL HIGH (ref 70–99)
Glucose-Capillary: 153 mg/dL — ABNORMAL HIGH (ref 70–99)
Glucose-Capillary: 159 mg/dL — ABNORMAL HIGH (ref 70–99)
Glucose-Capillary: 172 mg/dL — ABNORMAL HIGH (ref 70–99)

## 2021-01-22 LAB — MAGNESIUM: Magnesium: 2 mg/dL (ref 1.7–2.4)

## 2021-01-22 LAB — PHOSPHORUS: Phosphorus: 2.3 mg/dL — ABNORMAL LOW (ref 2.5–4.6)

## 2021-01-22 LAB — TSH: TSH: 1.353 u[IU]/mL (ref 0.350–4.500)

## 2021-01-22 LAB — CBC
HCT: 35.9 % — ABNORMAL LOW (ref 39.0–52.0)
Hemoglobin: 12.1 g/dL — ABNORMAL LOW (ref 13.0–17.0)
MCH: 32.2 pg (ref 26.0–34.0)
MCHC: 33.7 g/dL (ref 30.0–36.0)
MCV: 95.5 fL (ref 80.0–100.0)
Platelets: 110 10*3/uL — ABNORMAL LOW (ref 150–400)
RBC: 3.76 MIL/uL — ABNORMAL LOW (ref 4.22–5.81)
RDW: 13.2 % (ref 11.5–15.5)
WBC: 7.9 10*3/uL (ref 4.0–10.5)
nRBC: 0 % (ref 0.0–0.2)

## 2021-01-22 LAB — AMMONIA: Ammonia: 33 umol/L (ref 9–35)

## 2021-01-22 LAB — VITAMIN B12: Vitamin B-12: 158 pg/mL — ABNORMAL LOW (ref 180–914)

## 2021-01-22 LAB — FOLATE: Folate: 12.7 ng/mL (ref >5.9)

## 2021-01-22 MED ORDER — MIDAZOLAM HCL 2 MG/2ML IJ SOLN
2.0000 mg | Freq: Once | INTRAMUSCULAR | Status: AC
  Administered 2021-01-22: 2 mg via INTRAVENOUS
  Filled 2021-01-22: qty 2

## 2021-01-22 MED ORDER — LABETALOL HCL 5 MG/ML IV SOLN
10.0000 mg | INTRAVENOUS | Status: DC | PRN
  Administered 2021-01-22 – 2021-01-31 (×2): 10 mg via INTRAVENOUS
  Filled 2021-01-22 (×4): qty 4

## 2021-01-22 MED ORDER — LISINOPRIL 20 MG PO TABS
20.0000 mg | ORAL_TABLET | Freq: Every day | ORAL | Status: DC
  Administered 2021-01-22 – 2021-01-23 (×2): 20 mg
  Filled 2021-01-22 (×3): qty 1

## 2021-01-22 MED ORDER — POTASSIUM PHOSPHATES 15 MMOLE/5ML IV SOLN
15.0000 mmol | Freq: Once | INTRAVENOUS | Status: AC
  Administered 2021-01-22: 15 mmol via INTRAVENOUS
  Filled 2021-01-22: qty 5

## 2021-01-22 MED ORDER — INSULIN ASPART 100 UNIT/ML IJ SOLN
0.0000 [IU] | INTRAMUSCULAR | Status: DC
  Administered 2021-01-22 – 2021-01-23 (×6): 1 [IU] via SUBCUTANEOUS
  Administered 2021-01-23: 2 [IU] via SUBCUTANEOUS
  Administered 2021-01-24 (×3): 1 [IU] via SUBCUTANEOUS
  Administered 2021-01-24: 2 [IU] via SUBCUTANEOUS
  Administered 2021-01-24 – 2021-01-26 (×4): 1 [IU] via SUBCUTANEOUS
  Administered 2021-01-26: 2 [IU] via SUBCUTANEOUS
  Administered 2021-01-27: 3 [IU] via SUBCUTANEOUS
  Administered 2021-01-27 (×2): 2 [IU] via SUBCUTANEOUS
  Administered 2021-01-27: 5 [IU] via SUBCUTANEOUS
  Administered 2021-01-27: 2 [IU] via SUBCUTANEOUS
  Administered 2021-01-27: 1 [IU] via SUBCUTANEOUS
  Administered 2021-01-27 – 2021-01-28 (×3): 2 [IU] via SUBCUTANEOUS
  Administered 2021-01-29 – 2021-01-31 (×8): 1 [IU] via SUBCUTANEOUS
  Administered 2021-02-01: 2 [IU] via SUBCUTANEOUS
  Administered 2021-02-01 – 2021-02-02 (×4): 1 [IU] via SUBCUTANEOUS

## 2021-01-22 MED ORDER — AMLODIPINE BESYLATE 5 MG PO TABS
5.0000 mg | ORAL_TABLET | Freq: Every day | ORAL | Status: DC
  Administered 2021-01-22 – 2021-01-23 (×2): 5 mg
  Filled 2021-01-22 (×5): qty 1

## 2021-01-22 NOTE — Progress Notes (Signed)
EEG unhooked.

## 2021-01-22 NOTE — Procedures (Addendum)
Patient Name:Adam Mcclain HUT:[id number] Epilepsy Attending:Taylyn Brame Annabelle Harman Referring Physician/Provider:Dr Caryl Pina Duration:01/21/2021 6568 to 01/22/2021 1275  Patient history:70 year old male presenting with GTC seizures.EEG to evaluate for seizure  Level of alertness:Lethargic  AEDs during EEG study:LEV  Technical aspects: This EEG study was done with scalp electrodes positioned according to the 10-20 International system of electrode placement. Electrical activity was acquired at a sampling rate of 500Hz  and reviewed with a high frequency filter of 70Hz  and a low frequency filter of 1Hz . EEG data were recorded continuously and digitally stored.   Description: EEG showed intermittent generalized periodic discharges with triphasic morphology at 2 to 2.5 Hz. Additionally there is continuous generalized 3 to 6 Hz theta-delta slowing as well as 15 to 18 Hz beta activity in frontocentral region.  Event button was pressed on 01/21/2021 at 0950. Per RN patient had shaking of arms and legs which was difficult to visualize on camera.  Concomitant EEG before, during and after the event did not show any EEG changes due to seizure.  ABNORMALITY - Periodic discharges with triphasic morphology, generalized   - Continuous slow, generalized  IMPRESSION: This study showed periodic discharges with triphasic morphology at 2 to 2.5 Hz which is on the ictal-interictal continuum.  Additionally there is evidence of moderate to severe diffuse encephalopathy, nonspecific etiology. No seizures were seen throughout the recording.  Alanys Godino 

## 2021-01-22 NOTE — Progress Notes (Signed)
RT note-Patient taken to MRI and now back in 4N-29.

## 2021-01-22 NOTE — Progress Notes (Signed)
NAME:  Adam Mcclain, MRN:  [id number], DOB:  March 01, 1951, LOS: 3 ADMISSION DATE:  01/19/2021, CONSULTATION DATE: 5/6 REFERRING MD: Dr. Rubin Payor, CHIEF COMPLAINT: Seizure  History of Present Illness:  70 year old male with past medical history as below, which is significant for CVA, HTN, afibrillation on aspirin and Plavix, COPD, and CKD.  In the midday hours of 5/6 he had accompanied by family member to a doctor's appointment.  He was waiting in the car when a bystander walked by and noticed he had a gaze deviation and was shaking.  EMS was called.  Upon EMS arrival the patient was unresponsive and was administered 10 mg of Versed.  He was bagged in route to the emergency department where he was promptly intubated on presentation.  The ED physician did note some rigidity in the upper extremities concerning for seizure activity.  CT of the head was nonacute.  The patient was evaluated by neurology who provided a loading dose of Keppra and started EEG monitoring.  PCCM was asked to admit the patient due to the requirement of mechanical ventilation.  Significant Hospital Events: Including procedures, antibiotic start and stop dates in addition to other pertinent events   . 5/6 admit to [facility] for seizures on the ventilator. . 5/7 EEG shows no seizure activity . 5/8 propofol was turned off, patient is spiked fever but white count trended down  Interim History / Subjective:  Sedation was turned off, EEG showed no seizures.  Patient spiked fever of 102.8 but with a downtrending WBC count Still remain drowsy, tolerating pressure support trial   Objective   Blood pressure 127/74, pulse 74, temperature 98.4 F (36.9 C), temperature source Oral, resp. rate 18, weight 62.8 kg, SpO2 100 %.    Vent Mode: PRVC FiO2 (%):  [30 %] 30 % Set Rate:  [18 bmp] 18 bmp Vt Set:  [580 mL] 580 mL PEEP:  [5 cmH20] 5 cmH20 Plateau Pressure:  [16 cmH20-20 cmH20] 16 cmH20   Intake/Output Summary (Last 24 hours)  at 01/22/2021 1025 Last data filed at 01/22/2021 0700 Gross per 24 hour  Intake 2274.8 ml  Output 950 ml  Net 1324.8 ml   Filed Weights   01/19/21 1302 01/21/21 0500  Weight: 80 kg 62.8 kg    Examination: General: Thin elderly appearing male on mechanical ventilator via endotracheal tube HENT: EEG leads in place, moist mucous membranes, no JVD Lungs: Clear bilateral breath sounds, no wheezes or rhonchi Cardiovascular: RRR no mrg Abdomen: Soft, non distended, normoactive bowel sounds Extremities: No acute deformity Neuro: not responsive, not following commands, withdraws to painful stim in bilateral lower extremities Skin: No rash  Labs/imaging that I have personally reviewed  (right click and "Reselect all SmartList Selections" daily)  Na 140 K 3.8 Cl 113 Cr 1.28 WBC 7.9 Hgb 12.1  Resolved Hospital Problem list     Assessment & Plan:   New onset Seizure Witnessed by bystander EEG showed no seizures, diffuse slowing consistent with encephalopathy could be related to medications I:e propofol/fentanyl he was on Continue Keppra Neurology following We will get MRI brain today after EEG is off Continue to hold wellbutrin  Acute hypoxemic respiratory failure in the setting of seizure Continue mechanical ventilation, patient is tolerating spontaneous breathing trial but is not opening his eyes or following commands Watch for respiratory distress Off sedation completely  Paroxysmal atrial fibrillation: on ASA/plavix HTN Patient is in sinus rhythm Telemetry monitoring Continue to hold home lisinopril/HCTZ for now  AKI on  CKD stage III A Hypokalemia, resolved Hypophosphatemia Patient serum creatinine has improved to baseline Electrolytes are being supplemented aggressively Continue to monitor  COPD history documented. No associated home meds listed. No evidence of acute exacerbation.  PRN Duonebs  Best practice (right click and "Reselect all SmartList Selections"  daily)  Diet:  NPO tube feeds Pain/Anxiety/Delirium protocol (if indicated): Hold sedation VAP protocol (if indicated): Yes DVT prophylaxis: Subcutaneous Heparin GI prophylaxis: PPI Glucose control:  SSI No Central venous access:  N/A Arterial line:  N/A Foley:  N/A Mobility:  bed rest  PT consulted: N/A Last date of multidisciplinary goals of care discussion [pending] Code Status:  full code Disposition: ICU   Total critical care time: 39 minutes  Performed by: Cheri Fowler   Critical care time was exclusive of separately billable procedures and treating other patients.   Critical care was necessary to treat or prevent imminent or life-threatening deterioration.   Critical care was time spent personally by me on the following activities: development of treatment plan with patient and/or surrogate as well as nursing, discussions with consultants, evaluation of patient's response to treatment, examination of patient, obtaining history from patient or surrogate, ordering and performing treatments and interventions, ordering and review of laboratory studies, ordering and review of radiographic studies, pulse oximetry and re-evaluation of patient's condition.   Cheri Fowler MD [facility] See Amion for pager If no response to pager, please call [phone] until 7pm After 7pm, Please call E-link [phone]

## 2021-01-22 NOTE — Progress Notes (Signed)
Phos 2.3 ?Replaced per protocol  ?

## 2021-01-23 ENCOUNTER — Inpatient Hospital Stay (HOSPITAL_COMMUNITY): Payer: Medicare Other

## 2021-01-23 DIAGNOSIS — I639 Cerebral infarction, unspecified: Secondary | ICD-10-CM

## 2021-01-23 DIAGNOSIS — Z789 Other specified health status: Secondary | ICD-10-CM | POA: Diagnosis not present

## 2021-01-23 DIAGNOSIS — R569 Unspecified convulsions: Secondary | ICD-10-CM | POA: Diagnosis not present

## 2021-01-23 DIAGNOSIS — I63411 Cerebral infarction due to embolism of right middle cerebral artery: Secondary | ICD-10-CM | POA: Diagnosis not present

## 2021-01-23 DIAGNOSIS — J9601 Acute respiratory failure with hypoxia: Secondary | ICD-10-CM | POA: Diagnosis not present

## 2021-01-23 DIAGNOSIS — L8990 Pressure ulcer of unspecified site, unspecified stage: Secondary | ICD-10-CM | POA: Insufficient documentation

## 2021-01-23 LAB — BASIC METABOLIC PANEL
Anion gap: 8 (ref 5–15)
BUN: 22 mg/dL (ref 8–23)
CO2: 24 mmol/L (ref 22–32)
Calcium: 8.5 mg/dL — ABNORMAL LOW (ref 8.9–10.3)
Chloride: 109 mmol/L (ref 98–111)
Creatinine, Ser: 1.28 mg/dL — ABNORMAL HIGH (ref 0.61–1.24)
GFR, Estimated: >60 mL/min (ref >60)
Glucose, Bld: 157 mg/dL — ABNORMAL HIGH (ref 70–99)
Potassium: 3.9 mmol/L (ref 3.5–5.1)
Sodium: 141 mmol/L (ref 135–145)

## 2021-01-23 LAB — TRIGLYCERIDES: Triglycerides: 279 mg/dL — ABNORMAL HIGH (ref <150)

## 2021-01-23 LAB — GLUCOSE, CAPILLARY
Glucose-Capillary: 118 mg/dL — ABNORMAL HIGH (ref 70–99)
Glucose-Capillary: 136 mg/dL — ABNORMAL HIGH (ref 70–99)
Glucose-Capillary: 145 mg/dL — ABNORMAL HIGH (ref 70–99)
Glucose-Capillary: 147 mg/dL — ABNORMAL HIGH (ref 70–99)
Glucose-Capillary: 150 mg/dL — ABNORMAL HIGH (ref 70–99)
Glucose-Capillary: 167 mg/dL — ABNORMAL HIGH (ref 70–99)

## 2021-01-23 LAB — HEMOGLOBIN A1C
Hgb A1c MFr Bld: 5.3 % (ref 4.8–5.6)
Mean Plasma Glucose: 105.41 mg/dL

## 2021-01-23 LAB — LIPID PANEL
Cholesterol: 118 mg/dL (ref 0–200)
HDL: 15 mg/dL — ABNORMAL LOW (ref >40)
LDL Cholesterol: 43 mg/dL (ref 0–99)
Total CHOL/HDL Ratio: 7.9 RATIO
Triglycerides: 298 mg/dL — ABNORMAL HIGH (ref <150)
VLDL: 60 mg/dL — ABNORMAL HIGH (ref 0–40)

## 2021-01-23 LAB — PHOSPHORUS: Phosphorus: 2.6 mg/dL (ref 2.5–4.6)

## 2021-01-23 MED ORDER — FENTANYL CITRATE (PF) 100 MCG/2ML IJ SOLN
INTRAMUSCULAR | Status: AC
  Filled 2021-01-23: qty 2

## 2021-01-23 MED ORDER — CLOPIDOGREL BISULFATE 75 MG PO TABS: 75.0000 mg | ORAL_TABLET | Freq: Every day | ORAL | Status: DC

## 2021-01-23 MED ORDER — DEXMEDETOMIDINE HCL IN NACL 400 MCG/100ML IV SOLN
0.1000 ug/kg/h | INTRAVENOUS | Status: DC
  Administered 2021-01-23: 0.4 ug/kg/h via INTRAVENOUS
  Administered 2021-01-23: 1 ug/kg/h via INTRAVENOUS
  Administered 2021-01-24: 0.8 ug/kg/h via INTRAVENOUS
  Administered 2021-01-24: 1 ug/kg/h via INTRAVENOUS
  Administered 2021-01-24: 0.8 ug/kg/h via INTRAVENOUS
  Administered 2021-01-25: 1 ug/kg/h via INTRAVENOUS
  Filled 2021-01-23 (×7): qty 100

## 2021-01-23 MED ORDER — SODIUM CHLORIDE 0.9 % IV SOLN
3.0000 g | Freq: Three times a day (TID) | INTRAVENOUS | Status: AC
  Administered 2021-01-23 – 2021-01-29 (×20): 3 g via INTRAVENOUS
  Filled 2021-01-23: qty 3
  Filled 2021-01-23 (×3): qty 8
  Filled 2021-01-23 (×2): qty 3
  Filled 2021-01-23 (×3): qty 8
  Filled 2021-01-23: qty 3
  Filled 2021-01-23: qty 8
  Filled 2021-01-23 (×2): qty 3
  Filled 2021-01-23 (×3): qty 8
  Filled 2021-01-23: qty 3
  Filled 2021-01-23: qty 8
  Filled 2021-01-23 (×2): qty 3

## 2021-01-23 MED ORDER — FENTANYL CITRATE (PF) 100 MCG/2ML IJ SOLN: 25.0000 ug | Freq: Once | INTRAMUSCULAR | Status: AC

## 2021-01-23 MED ORDER — THIAMINE HCL 100 MG/ML IJ SOLN
250.0000 mg | Freq: Every day | INTRAVENOUS | Status: AC
  Administered 2021-01-25 – 2021-01-30 (×6): 250 mg via INTRAVENOUS
  Filled 2021-01-23 (×6): qty 2.5

## 2021-01-23 MED ORDER — MIDAZOLAM HCL 2 MG/2ML IJ SOLN
INTRAMUSCULAR | Status: AC
  Filled 2021-01-23: qty 2

## 2021-01-23 MED ORDER — THIAMINE HCL 100 MG/ML IJ SOLN
100.0000 mg | Freq: Every day | INTRAMUSCULAR | Status: DC
  Administered 2021-01-31 – 2021-02-02 (×3): 100 mg via INTRAVENOUS
  Filled 2021-01-23 (×3): qty 2

## 2021-01-23 MED ORDER — ATORVASTATIN CALCIUM 40 MG PO TABS
40.0000 mg | ORAL_TABLET | Freq: Every day | ORAL | Status: DC
  Administered 2021-01-23 – 2021-02-02 (×10): 40 mg
  Filled 2021-01-23 (×11): qty 1

## 2021-01-23 MED ORDER — VITAMIN B-12 1000 MCG PO TABS
1000.0000 ug | ORAL_TABLET | Freq: Every day | ORAL | Status: DC
  Filled 2021-01-23: qty 1

## 2021-01-23 MED ORDER — CLOPIDOGREL BISULFATE 75 MG PO TABS
75.0000 mg | ORAL_TABLET | Freq: Every day | ORAL | Status: DC
  Administered 2021-01-23 – 2021-01-27 (×4): 75 mg
  Filled 2021-01-23 (×5): qty 1

## 2021-01-23 MED ORDER — KETAMINE HCL 50 MG/5ML IJ SOSY
PREFILLED_SYRINGE | INTRAMUSCULAR | Status: AC
  Filled 2021-01-23: qty 5

## 2021-01-23 MED ORDER — THIAMINE HCL 100 MG/ML IJ SOLN
500.0000 mg | Freq: Three times a day (TID) | INTRAVENOUS | Status: AC
  Administered 2021-01-23 – 2021-01-25 (×6): 500 mg via INTRAVENOUS
  Filled 2021-01-23 (×6): qty 5

## 2021-01-23 MED ORDER — CYANOCOBALAMIN 1000 MCG/ML IJ SOLN
1000.0000 ug | Freq: Every day | INTRAMUSCULAR | Status: AC
  Administered 2021-01-23 – 2021-01-29 (×7): 1000 ug via INTRAMUSCULAR
  Filled 2021-01-23 (×7): qty 1

## 2021-01-23 MED ORDER — FENTANYL CITRATE (PF) 100 MCG/2ML IJ SOLN
25.0000 ug | INTRAMUSCULAR | Status: DC | PRN
  Administered 2021-01-23: 50 ug via INTRAVENOUS
  Administered 2021-01-23: 100 ug via INTRAVENOUS
  Filled 2021-01-23: qty 2

## 2021-01-23 MED ORDER — ETOMIDATE 2 MG/ML IV SOLN
INTRAVENOUS | Status: AC
  Administered 2021-01-23: 20 mg
  Filled 2021-01-23: qty 20

## 2021-01-23 MED ORDER — ROCURONIUM BROMIDE 10 MG/ML (PF) SYRINGE
PREFILLED_SYRINGE | INTRAVENOUS | Status: AC
  Administered 2021-01-23: 100 mg
  Filled 2021-01-23: qty 10

## 2021-01-23 MED ORDER — ASPIRIN 81 MG PO CHEW
81.0000 mg | CHEWABLE_TABLET | Freq: Every day | ORAL | Status: DC
  Administered 2021-01-23 – 2021-01-27 (×4): 81 mg
  Filled 2021-01-23 (×5): qty 1

## 2021-01-23 MED ORDER — LACTATED RINGERS IV BOLUS
1000.0000 mL | Freq: Once | INTRAVENOUS | Status: AC
  Administered 2021-01-23: 1000 mL via INTRAVENOUS

## 2021-01-23 NOTE — Progress Notes (Addendum)
NAME:  Adam Mcclain, MRN:  [id number], DOB:  11-05-1950, LOS: 4 ADMISSION DATE:  01/19/2021, CONSULTATION DATE: 5/6 REFERRING MD: Dr. Rubin Payor, CHIEF COMPLAINT: Seizure  History of Present Illness:  70 year old male with past medical history as below, which is significant for CVA, HTN, afibrillation on aspirin and Plavix, COPD, and CKD.  In the midday hours of 5/6 he had accompanied by family member to a doctor's appointment.  He was waiting in the car when a bystander walked by and noticed he had a gaze deviation and was shaking.  EMS was called.  Upon EMS arrival the patient was unresponsive and was administered 10 mg of Versed.  He was bagged in route to the emergency department where he was promptly intubated on presentation.  The ED physician did note some rigidity in the upper extremities concerning for seizure activity.  CT of the head was nonacute.  The patient was evaluated by neurology who provided a loading dose of Keppra and started EEG monitoring.  PCCM was asked to admit the patient due to the requirement of mechanical ventilation.  Significant Hospital Events: Including procedures, antibiotic start and stop dates in addition to other pertinent events   . 5/6 admit to [facility] for seizures on the ventilator. . 5/7 EEG shows no seizure activity . 5/8 propofol was turned off, patient is spiked fever but white count trended down . 5/9 EEG was negative for seizures, MRI was done showed very small right parietal stroke  Interim History / Subjective:  Patient remain off sedation except he got couple of fentanyl as needed pushes at night due to agitation Continues to spike low-grade fever, respiratory culture is growing polymicrobial's Tolerating pressure support trial this morning   Objective   Blood pressure 110/67, pulse 79, temperature (!) 100.7 F (38.2 C), temperature source Axillary, resp. rate 18, weight 63 kg, SpO2 99 %.    Vent Mode: PRVC FiO2 (%):  [30 %] 30 % Set  Rate:  [18 bmp] 18 bmp Vt Set:  [580 mL] 580 mL PEEP:  [5 cmH20] 5 cmH20 Pressure Support:  [8 cmH20] 8 cmH20 Plateau Pressure:  [13 cmH20-19 cmH20] 14 cmH20   Intake/Output Summary (Last 24 hours) at 01/23/2021 0817 Last data filed at 01/23/2021 0700 Gross per 24 hour  Intake 1826.14 ml  Output 1450 ml  Net 376.14 ml   Filed Weights   01/19/21 1302 01/21/21 0500 01/23/21 0500  Weight: 80 kg 62.8 kg 63 kg    Examination: General: Thin elderly appearing male on mechanical ventilator via endotracheal tube HENT: Atraumatic, normocephalic, moist mucous membranes, no JVD Lungs: Coarse breath sounds bilaterally, no wheezes or rhonchi Cardiovascular: RRR no mrg Abdomen: Soft, non distended, normoactive bowel sounds Extremities: No acute deformity Neuro: Opens eyes with vocal stimuli, following intermittent commands, moving all 4 extremities spontaneously Skin: No dry skin all around forehead  Labs/imaging that I have personally reviewed  (right click and "Reselect all SmartList Selections" daily)  Na 141 K 3.9 Cl 109 Cr 1.28 WBC 7.9 Hgb 12.1  5/9: MRI brain: New subcentimeter acute infarct in right parietal region.  With multiple chronic infarcts  Resolved Hospital Problem list   AKI on CKD stage III A Hypokalemia Hypophosphatemia   Assessment & Plan:   New onset Seizure Acute right parietal stroke Witnessed by bystander EEG showed no seizures, diffuse slowing consistent with encephalopathy could be related to medications I:e propofol/fentanyl he was on Continue Keppra Neurology input is appreciated MRI brain confirmed new stroke  in right parietal region Continue aspirin and atorvastatin Continue to hold wellbutrin  Acute hypoxemic respiratory failure in the setting of seizure Continue mechanical ventilation, patient is tolerating spontaneous breathing trial  Now he is following commands, watch for respiratory distress, if he tolerates for 30 minutes we will try to  extubate Keep off sedation  Paroxysmal atrial fibrillation: Not on AC HTN Remained in sinus rhythm Telemetry monitoring Restarted on amlodipine and lisinopril Continue to hold HCTZ  COPD history documented. No associated home meds listed. No evidence of acute exacerbation.  PRN Duonebs  Best practice (right click and "Reselect all SmartList Selections" daily)  Diet:  NPO Pain/Anxiety/Delirium protocol (if indicated):  VAP protocol (if indicated): Yes DVT prophylaxis: Subcutaneous Heparin GI prophylaxis: PPI Glucose control:  SSI No Central venous access:  N/A Arterial line:  N/A Foley:  N/A Mobility:  bed rest  PT consulted: N/A Last date of multidisciplinary goals of care discussion [5/10] Code Status:  full code Disposition: ICU   Total critical care time: 36 minutes  Performed by: Cheri Fowler   Critical care time was exclusive of separately billable procedures and treating other patients.   Critical care was necessary to treat or prevent imminent or life-threatening deterioration.   Critical care was time spent personally by me on the following activities: development of treatment plan with patient and/or surrogate as well as nursing, discussions with consultants, evaluation of patient's response to treatment, examination of patient, obtaining history from patient or surrogate, ordering and performing treatments and interventions, ordering and review of laboratory studies, ordering and review of radiographic studies, pulse oximetry and re-evaluation of patient's condition.   Cheri Fowler MD [facility] See Amion for pager If no response to pager, please call [phone] until 7pm After 7pm, Please call E-link [phone]

## 2021-01-23 NOTE — Progress Notes (Signed)
Patient was extubated after he tolerated pressure support trial for 30 minutes and started following commands.  Post extubation patient was noted to have a weak cough with copious amount of yellow/greenish secretions from respiratory tract, during suctioning he vomited bilious content, and probably aspirated, after that he became tachypneic, hypoxic and struggling to breathe.  His respiratory rate went up to 40s with crackles bilateral.  Decision was made to reintubate and place him on ventilator. Patient was started on IV Unasyn for likely aspiration pneumonia   Patient's brother was called and updated about his condition.  Additional critical care time: 50 minutes  Performed by: Cheri Fowler   Critical care time was exclusive of separately billable procedures and treating other patients.   Critical care was necessary to treat or prevent imminent or life-threatening deterioration.   Critical care was time spent personally by me on the following activities: development of treatment plan with patient and/or surrogate as well as nursing, discussions with consultants, evaluation of patient's response to treatment, examination of patient, obtaining history from patient or surrogate, ordering and performing treatments and interventions, ordering and review of laboratory studies, ordering and review of radiographic studies, pulse oximetry and re-evaluation of patient's condition.   Cheri Fowler MD [facility] See Amion for pager If no response to pager, please call [phone] until 7pm After 7pm, Please call E-link [phone]

## 2021-01-23 NOTE — Progress Notes (Signed)
eLink Physician-Brief Progress Note Patient Name: Adam Mcclain DOB: 31-Mar-1951 MRN: [id number]   Date of Service  01/23/2021  HPI/Events of Note  Patient sub-optimally sedated for a trip to MRI, he is currently on 0.5 mcg of Precedex. Bedside RN is concerned about hypotension with increased sedation.  eICU Interventions  Precedex titrated up  To 0.7 mcg, if he tolerates it,  Precedex will be titrated further to try to achieve adequate sedation for MRI trip.        Thomasene Lot Everlean Bucher 01/23/2021, 9:26 PM

## 2021-01-23 NOTE — Progress Notes (Signed)
NEUROLOGY CONSULTATION PROGRESS NOTE   Date of service: Jan 23, 2021 Patient Name: Adam Mcclain MRN:  [id number] DOB:  December 03, 1950  Brief HPI  Adam Mcclain is a 70 year old male with a PMHx of CVA, HTN, atrial fibrillation on aspirin and Plavix, COPD, and CKD presenting with GTC seizures. Received 10 mg Versed with EMS andwasloaded with 2000 mg Keppra in the ED. Intubated in the ED and admitted to the ICU. Unclear if history of seizures. cEEG with Triphasics 2-3 Hz on ictal-interictal spectrum but no seizures. Also notable for moderate to severe diffuse encephalopathy. cEEG was disontinued after LTM for more than 48 hours with no seizures.  MRI Brain with possible subcentimeter acute right parietal cortical infarct along the margin of a chronic parieto-occipital infarct. Large chronic left PCA infarct. Encephalomalacia in the anterior right temporal lobe. Moderate chronic small vessel ischemic disease with multiple chronic lacunar infarcts.   Interval Hx   He was noted by RN to be following commands this AM and was extubated but had to be re-intubated for respiratory status.  Vitals   Vitals:   01/23/21 0909 01/23/21 1000 01/23/21 1115 01/23/21 1200  BP: (!) 218/90 100/82 (!) 73/61   Pulse: (!) 110 99 81   Resp: 18 18 18    Temp:    97.6 F (36.4 C)  TempSrc:    Axillary  SpO2: 99% 99% 99%   Weight:         There is no height or weight on file to calculate BMI.  Physical Exam   General: Laying comfortably in bed; intubated. HENT: Normal oropharynx and mucosa. Normal external appearance of ears and nose. Neck: Supple, no pain or tenderness CV: No JVD. No peripheral edema. Pulmonary: Symmetric Chest rise. Normal respiratory effort. Abdomen: Soft to touch, non-tender. Ext: No cyanosis, edema, or deformity Skin: No rash. Normal palpation of skin.  Musculoskeletal: Normal digits and nails by inspection. No clubbing.  Neurologic Examination  Mental status/Cognition: Partially  opens eyes to loud clap. Tracks faces on left and right. Nods his head to answer simple questions. Speech/language: unable to assess due to intubation. Cranial nerves:   CN II Pupils equal and reactive to light, blinks to threat BL   CN III,IV,VI EOM intact, no gaze preference or deviation, no nystagmus   CN V    CN VII Symmetric facial smile.   CN VIII Turns head towards speech.   CN IX & X    CN XI    CN XII    Motor:  Muscle bulk: poor, tone normal Unable to do detailes strength testinf but holds BL arms up off the bed and holds BL legs up off the bed.  Sensation:  Light touch Intact to light touch.   Pin prick    Temperature    Vibration   Proprioception    Coordination/Complex Motor:  - Finger to Nose intact BL  Labs   Basic Metabolic Panel:  Lab Results  Component Value Date   NA 141 01/23/2021   K 3.9 01/23/2021   CO2 24 01/23/2021   GLUCOSE 157 (H) 01/23/2021   BUN 22 01/23/2021   CREATININE 1.28 (H) 01/23/2021   CALCIUM 8.5 (L) 01/23/2021   GFRNONAA >60 01/23/2021   HbA1c: No results found for: HGBA1C LDL: No results found for: [facility] Urine Drug Screen:     Component Value Date/Time   LABOPIA NONE DETECTED 01/19/2021 1237   COCAINSCRNUR NONE DETECTED 01/19/2021 1237   LABBENZ POSITIVE (A) 01/19/2021  1237   AMPHETMU NONE DETECTED 01/19/2021 1237   THCU NONE DETECTED 01/19/2021 1237   LABBARB NONE DETECTED 01/19/2021 1237    Alcohol Level     Component Value Date/Time   ETH <10 01/19/2021 1158   No results found for: PHENYTOIN, ZONISAMIDE, LAMOTRIGINE, LEVETIRACETA No results found for: PHENYTOIN, PHENOBARB, VALPROATE, CBMZ  Imaging and Diagnostic studies  MR BRAIN WO CONTRAST  IMPRESSION: 1. Possible subcentimeter acute right parietal cortical infarct along the margin of a chronic parieto-occipital infarct. 2. Large chronic left PCA infarct. 3. Encephalomalacia in the anterior right temporal lobe. 4. Moderate chronic small vessel ischemic  disease with multiple chronic lacunar infarcts.  Vit B12: 158   Impression   Adam Mcclain is a 70 y.o. male with a PMHx of CVA, HTN, atrial fibrillation on aspirin and Plavix(not sure why he is not on [facility]), COPD, and CKD presenting with GTC seizures. Resolved with Versed. Now more awake and follows basic commands even while intubated. Likely prolonged post ictal or toxic metabolic encephalopathy. He is deficient in Vitamin B12 so will prophylactically give high dose thiamine. As for the noted stroke on MRI Brain, likely due to history of Afibb.  I attempted to contact family on the phone number listed to see why he is not on anticoagulation but no answer.  Will get stroke workup.  Recommendations  Plan:  - Frequent Neuro checks per stroke unit protocol - Vascular imaging with MRA Angio Head without contrast and US Carotid doppler - TTE pending. - Recommend obtaining Lipid panel with LDL - Please start statin if LDL > 70 - Recommend HbA1c - Antithrombotic - Aspirin 81mg  daily for now. Will need to clarify with family about diagnosis of Afib and reason for not being on Anticoagulation. - Recommend DVT ppx - SBP goal - permissive hypertension first 24 h < 220/110. Held home meds.  - Recommend Telemetry monitoring for arrythmia - Recommend bedside swallow screen prior to PO intake. - Stroke education booklet - Recommend PT/OT/SLP consult  ______________________________________________________________________   Thank you for the opportunity to take part in the care of this patient. If you have any further questions, please contact the neurology consultation attending.  Signed,  [facility] Pager Number Erick Blinks

## 2021-01-23 NOTE — Progress Notes (Signed)
Carotid duplex has been completed.  Critical results given to [place], RN at [phone].  Results can be found under chart review under CV PROC. 01/23/2021 5:58 PM Nilay Mangrum RVT, RDMS

## 2021-01-23 NOTE — Procedures (Signed)
Extubation Procedure Note  Patient Details:   Name: Adam Mcclain DOB: June 22, 1951 MRN: [id number]   Airway Documentation:  Airway 7.5 mm (Active)  Secured at (cm) 26 cm 01/19/21 0002   Vent end date: (not recorded) Vent end time: (not recorded)   Evaluation  O2 sats: Transiently fell after procedure.  Complications: Complications of vomited following extubation, developed stridor & was reintubated.  Patient did not tolerate procedure well. Bilateral Breath Sounds: Rhonchi,Diminished   No   Patient vomited following extubation with possible aspiration then developed stridor. Dr. Merrily Pew was called to the bedside & the patient was bagged with 100% FIO2 & re intubated.   Namrata Dangler, Margaretmary Dys 01/23/2021, 08:50 AM

## 2021-01-23 NOTE — Procedures (Signed)
Intubation Procedure Note  Adam Mcclain  [id number]  Oct 29, 1950  Date:01/23/21  Time:9:07 AM   Provider Performing:Milania Haubner    Procedure: Intubation (31500)  Indication(s) Respiratory Failure  Consent Risks of the procedure as well as the alternatives and risks of each were explained to the patient and/or caregiver.  Consent for the procedure was obtained and is signed in the bedside chart   Anesthesia Etomidate and Rocuronium   Time Out Verified patient identification, verified procedure, site/side was marked, verified correct patient position, special equipment/implants available, medications/allergies/relevant history reviewed, required imaging and test results available.   Sterile Technique Usual hand hygeine, masks, and gloves were used   Procedure Description Patient positioned in bed supine.  Sedation given as noted above.  Patient was intubated with endotracheal tube using DL.  View was Grade 3 only epiglottis .  Number of attempts was 1.  Colorimetric CO2 detector was consistent with tracheal placement.   Complications/Tolerance None; patient tolerated the procedure well. Chest X-ray is ordered to verify placement.   EBL Minimal   Specimen(s) None

## 2021-01-23 NOTE — Progress Notes (Signed)
MRI attempted at 22:15 , no answer

## 2021-01-23 NOTE — Progress Notes (Signed)
eLink Physician-Brief Progress Note Patient Name: Adam Mcclain DOB: July 13, 1951 MRN: [id number]   Date of Service  01/23/2021  HPI/Events of Note  Pt is waking up on the vent off all sedation.  eICU Interventions  Give fentanyl prn.     Intervention Category Minor Interventions: Other:  Larinda Buttery 01/23/2021, 1:01 AM

## 2021-01-23 NOTE — Progress Notes (Signed)
Spoke to Dr.  Amada Jupiter,  ok to hold off MRI and re-visit in am .

## 2021-01-23 NOTE — Progress Notes (Signed)
MRI attempted at 23:28 no answer

## 2021-01-24 ENCOUNTER — Inpatient Hospital Stay (HOSPITAL_COMMUNITY): Payer: Medicare Other

## 2021-01-24 ENCOUNTER — Other Ambulatory Visit (HOSPITAL_COMMUNITY): Payer: Medicare Other

## 2021-01-24 DIAGNOSIS — I6389 Other cerebral infarction: Secondary | ICD-10-CM

## 2021-01-24 DIAGNOSIS — I6330 Cerebral infarction due to thrombosis of unspecified cerebral artery: Principal | ICD-10-CM | POA: Insufficient documentation

## 2021-01-24 LAB — GLUCOSE, CAPILLARY
Glucose-Capillary: 110 mg/dL — ABNORMAL HIGH (ref 70–99)
Glucose-Capillary: 138 mg/dL — ABNORMAL HIGH (ref 70–99)
Glucose-Capillary: 142 mg/dL — ABNORMAL HIGH (ref 70–99)
Glucose-Capillary: 144 mg/dL — ABNORMAL HIGH (ref 70–99)
Glucose-Capillary: 146 mg/dL — ABNORMAL HIGH (ref 70–99)
Glucose-Capillary: 147 mg/dL — ABNORMAL HIGH (ref 70–99)
Glucose-Capillary: 161 mg/dL — ABNORMAL HIGH (ref 70–99)

## 2021-01-24 LAB — ECHOCARDIOGRAM COMPLETE
Area-P 1/2: 3.37 cm2
S' Lateral: 2.5 cm
Weight: 2137.58 oz

## 2021-01-24 LAB — CULTURE, RESPIRATORY W GRAM STAIN

## 2021-01-24 MED ORDER — FENTANYL CITRATE (PF) 100 MCG/2ML IJ SOLN: 50.0000 ug | INTRAMUSCULAR | Status: DC

## 2021-01-24 MED ORDER — FENTANYL CITRATE (PF) 100 MCG/2ML IJ SOLN
50.0000 ug | Freq: Four times a day (QID) | INTRAMUSCULAR | Status: DC | PRN
  Administered 2021-01-24 (×2): 50 ug via INTRAVENOUS
  Filled 2021-01-24 (×2): qty 2

## 2021-01-24 MED ORDER — FENTANYL CITRATE (PF) 100 MCG/2ML IJ SOLN
50.0000 ug | INTRAMUSCULAR | Status: DC | PRN
  Administered 2021-01-24: 50 ug via INTRAVENOUS
  Filled 2021-01-24: qty 2

## 2021-01-24 NOTE — Progress Notes (Signed)
  Echocardiogram 2D Echocardiogram has been performed.  Delcie Roch 01/24/2021, 3:43 PM

## 2021-01-24 NOTE — Progress Notes (Signed)
eLink Physician-Brief Progress Note Patient Name: Adam Mcclain DOB: 24-Dec-1950 MRN: [id number]   Date of Service  01/24/2021  HPI/Events of Note  Patient with sub-optimal sedation on the ventilator, he is very agitated and at risk of self extubation. Patient is for possible a.m. extubation and enteral nutrition is to be held at midnight per note in the chart but there is no order.  eICU Interventions  Precedex ceiling increased to 1.2 mcg, bilateral soft wrist restraints ordered, enteral nutrition hold order placed for midnight.        [name] 01/24/2021, 8:11 PM

## 2021-01-24 NOTE — Progress Notes (Signed)
STROKE TEAM PROGRESS NOTE  Adam Mcclain is a 70 y/o gentleman with a history of previous CVAs who presented after being found unresponsive and apparently having a seizure in his car by a stranger. He was waiting in the car during a family member's doctor's appointment when he was found. He was given midazolam by EMS and BVM to the ED where he was intubated. He had a keppra load in the ED and an EEG performed showing no seizures. He had some LE shaking during the EEG which was not associated with seizure activity on EEG. Stroke found on work up.   INTERVAL HISTORY No visitors at bedside Patient remains intubated but is wide-awake alert and interactive and follows commands and moves all 4 extremities well without any focal deficits.  .  Patient was extubated but required reintubation due to vomiting and aspiration.  Plan is to extubate tomorrow Vitals:   01/24/21 1101 01/24/21 1200 01/24/21 1215 01/24/21 1300  BP: 93/64 (!) 82/51 95/66 97/65   Pulse: (!) 57 65 (!) 58 (!) 56  Resp: (!) 27 (!) 28 (!) 32 (!) 29  Temp:  98.8 F (37.1 C)    TempSrc:  Axillary    SpO2: 98% 99% 98% 98%  Weight:       CBC:  Recent Labs  Lab 01/19/21 1158 01/19/21 1244 01/21/21 0549 01/22/21 0400  WBC 7.2   < > 12.2* 7.9  NEUTROABS 5.5  --  10.5*  --   HGB 16.3   < > 11.3* 12.1*  HCT 47.9   < > 34.0* 35.9*  MCV 94.3   < > 97.4 95.5  PLT 165   < > 125* 110*   < > = values in this interval not displayed.   Basic Metabolic Panel:  Recent Labs  Lab 01/21/21 1631 01/22/21 0400 01/23/21 0433  NA  --  140 141  K  --  3.8 3.9  CL  --  113* 109  CO2  --  21* 24  GLUCOSE  --  175* 157*  BUN  --  18 22  CREATININE  --  1.28* 1.28*  CALCIUM  --  8.3* 8.5*  MG 2.3 2.0  --   PHOS 1.9* 2.3* 2.6   Lipid Panel:  Recent Labs  Lab 01/23/21 1215  CHOL 118  TRIG 298*  HDL 15*  CHOLHDL 7.9  VLDL 60*  LDLCALC 43   HgbA1c:  Recent Labs  Lab 01/23/21 1215  HGBA1C 5.3   Urine Drug Screen:  Recent Labs  Lab  01/19/21 1237  LABOPIA NONE DETECTED  COCAINSCRNUR NONE DETECTED  LABBENZ POSITIVE*  AMPHETMU NONE DETECTED  THCU NONE DETECTED  LABBARB NONE DETECTED    Alcohol Level  Recent Labs  Lab 01/19/21 1158  ETH <10   IMAGING and DIAGNOSTICS  MRI 1. Possible subcentimeter acute right parietal cortical infarct along the margin of a chronic parieto-occipital infarct. 2. Large chronic left PCA infarct. 3. Encephalomalacia in the anterior right temporal lobe. 4. Moderate chronic small vessel ischemic disease with multiple chronic lacunar infarcts.  CT head  1. No evidence of acute intracranial abnormality. 2. Similar encephalomalacia in the anterolateral right temporal lobe and bilateral occipital lobes. 3. Moderate chronic microvascular ischemic disease.  Carotid Doppler Study Right Carotid: Velocities in the right ICA are consistent with a 1-39% stenosis. Hemodynamically significant plaque 80-99% visualized in the CCA.  The ECA appears >50% stenosed.   Left Carotid: Velocities in the left ICA are consistent with a 60-79%  stenosis. Non-hemodynamically significant plaque <50% noted in the CCA. The ECA appears >50% stenosed.   Vertebrals: Left vertebral artery demonstrates antegrade flow. Right Vertebral artery shows severely dampened waveforms.  Subclavians: Normal flow hemodynamics were seen in the right subclavian  artery.  Left subclavian artery shows low resistive, post stenotic waveforms.   PHYSICAL EXAM General:  Pleasant frail elderly Caucasian male laying comfortably in bed; intubated. HENT: Normal oropharynx and mucosa. Normal external appearance of ears and nose. Neck: Supple, no pain or tenderness CV: No JVD. No peripheral edema. Pulmonary: Symmetric Chest rise. Normal respiratory effort. Abdomen: Soft to touch, non-tender. Ext: No cyanosis, edema, or deformity Skin: No rash. Normal palpation of skin.  Musculoskeletal: Normal digits and nails by inspection. No  clubbing.  Neurologic Examination  Mental status/Cognition:  Patient is intubated.  Partially opens eyes to sound. Tracks faces on left and right. Nods his head to answer simple questions. Speech/language: unable to assess due to intubation. Cranial nerves:   CN II Pupils equal and reactive to light, blinks to threat BL   CN III,IV,VI EOM intact, no gaze preference or deviation, no nystagmus   CN V    CN VII Symmetric facial smile.   CN VIII Turns head towards speech.   CN IX & X    CN XI    CN XII    Motor:  Muscle bulk: poor, tone normal.  Able to raise all 4 extremities up against gravity without focal weakness. Unable to do detailes strength testinf but holds BL arms up off the bed and holds BL legs up off the bed.  ASSESSMENT/PLAN Adam Mcclain is a 70 year old male with a PMHx ofCVA, HTN, atrialfibrillation on aspirin and Plavix, COPD, and CKDpresenting with GTC seizures found to have a possible subcentimeter acute right parietal cortical infarct.  Possible subcentimeter acute right parietal cortical infarct along the margin of a chronic parieto-occipital infarct -likely clinically silent as the semiology of his seizures seems more related to his old left PCA infarct.  Etiology of both strokes likely atrial fibrillation   Code Stroke:  No evidence of acute intracranial abnormality.  MRI  Possible subcentimeter acute right parietal cortical infarct along the margin of a chronic parieto-occipital infarct. Large chronic left PCA infarct and multiple chronic lacunar infarcts.   TCD bubble study is pending  Carotid Doppler:  Hemodynamically significant plaque 80-99% visualized in the CCA. Will need consideration for revascularization when more stable/appropriate. Pending  2D Echo pending  VTE prophylaxis - heparin 5000 units ppx     Diet   Diet NPO time specified     Currently on DAPT, final plan is pending more information  Therapy recommendations:  TBD  Disposition:  TBD  A fib not on AC A fib history but evidently not on anticoagulant but on DAPT prior to admission. Attempted to reach daughter and brother by phone to discuss or at least obtain PCP name/contact info. Neither answered their phones. Voicemails left. Dr. Derry Lory also tried yesterday.   Seizure  Unclear if history of seizures.   cEEG with Triphasics 2-3 Hz on ictal-interictal spectrum but no seizures.   Also notable for moderate to severe diffuse encephalopathy.   cEEG was disontinued after LTM for more than 48 hours with no seizures.  Continue Keppra 1000mg  BID IV  Hypertension  Home meds:  Norvasc 5, Lasix 20mg , Zestril 20mg , all daily  Permissive hypertension (OK if < 220/120) but gradually normalize in 5-7 days . Long-term BP goal normotensive  Hyperlipidemia  Home meds:  Lipitor 40mg    LDL 43, goal < 70  Continue statin at discharge  Dysphagia Feeding   NPO  On TF @55cc /hr  Diabetes type II Controlled  HgbA1c 5.3, goal < 7.0  CBGs Recent Labs    01/24/21 0352 01/24/21 0755 01/24/21 1131  GLUCAP 147* 161* 144*      SSI  Other Stroke Risk Factors  Advanced Age >/= 74   History not on file, family not answering phone today   Order placed to obtain weight and record  Large chronic left PCA infarct and multiple chronic lacunar infarcts.  Other Active Problems     Hospital day # 5  I have personally obtained history,examined this patient, reviewed notes, independently viewed imaging studies, participated in medical decision making and plan of care.ROS completed by me personally and pertinent positives fully documented  I have made any additions or clarifications directly to the above note. Agree with note above.  Patient presented with the weakness seizure likely symptomatic from his old stroke.  MRI also shows an acute right parietal infarct which is likely clinically silent and the etiology likely is atrial fibrillation.   Recommend anticoagulation with Eliquis for secondary stroke prevention and aggressive risk factor modification.  Continue ongoing stroke work-up.  Extubate when tolerated as per pulmonary critical care team.  Long discussion with patient and with Dr. 03/26/21 critical care medicines.  Continue Keppra 1 g twice daily for seizures. This patient is critically ill and at significant risk of neurological worsening, death and care requires constant monitoring of vital signs, hemodynamics,respiratory and cardiac monitoring, extensive review of multiple databases, frequent neurological assessment, discussion with family, other specialists and medical decision making of high complexity.I have made any additions or clarifications directly to the above note.This critical care time does not reflect procedure time, or teaching time or supervisory time of PA/NP/Med Resident etc but could involve care discussion time.  I spent 30 minutes of neurocritical care time  in the care of  this patient.      75, MD Medical Director [facility] Pager: [phone] 01/24/2021 3:13 PM   To contact Stroke Continuity provider, please refer to [phone]. After hours, contact General Neurology

## 2021-01-24 NOTE — Plan of Care (Deleted)
progressing 

## 2021-01-24 NOTE — Progress Notes (Addendum)
NAME:  Adam Mcclain, MRN:  [id number], DOB:  1951-05-23, LOS: 5 ADMISSION DATE:  01/19/2021, CONSULTATION DATE: 5/6 REFERRING MD: Dr. Rubin Payor, CHIEF COMPLAINT: Seizure  History of Present Illness:  70 year old male with past medical history as below, which is significant for CVA, HTN, afibrillation on aspirin and Plavix, COPD, and CKD.  In the midday hours of 5/6 he had accompanied by family member to a doctor's appointment.  He was waiting in the car when a bystander walked by and noticed he had a gaze deviation and was shaking.  EMS was called.  Upon EMS arrival the patient was unresponsive and was administered 10 mg of Versed.  He was bagged in route to the emergency department where he was promptly intubated on presentation.  The ED physician did note some rigidity in the upper extremities concerning for seizure activity.  CT of the head was nonacute.  The patient was evaluated by neurology who provided a loading dose of Keppra and started EEG monitoring.  PCCM was asked to admit the patient due to the requirement of mechanical ventilation.  Significant Hospital Events: Including procedures, antibiotic start and stop dates in addition to other pertinent events   . 5/6 admit to [facility] for seizures on the ventilator. . 5/7 EEG shows no seizure activity . 5/8 propofol was turned off, patient is spiked fever but white count trended down . 5/9 EEG was negative for seizures, MRI was done showed very small right parietal stroke . 5/10 trial of extubation, patient is struggled had to be reintubated  Interim History / Subjective:  Patient was given a trial of extubation yesterday after he tolerated pressure support trial for half an hour, post extubation he was tachypneic, hypoxic and vomited with aspiration into the lungs, based on unstable vital signs patient was immediately reintubated, placed on mechanical ventilation Currently tolerating pressure support trial but continued to have copious  amount of thick secretions  Objective   Blood pressure 117/77, pulse (!) 56, temperature 98.8 F (37.1 C), temperature source Axillary, resp. rate 18, weight 60.6 kg, SpO2 99 %.    Vent Mode: PRVC FiO2 (%):  [40 %-100 %] 40 % Set Rate:  [18 bmp] 18 bmp Vt Set:  [580 mL] 580 mL PEEP:  [5 cmH20] 5 cmH20 Plateau Pressure:  [16 cmH20-19 cmH20] 19 cmH20   Intake/Output Summary (Last 24 hours) at 01/24/2021 0804 Last data filed at 01/24/2021 0600 Gross per 24 hour  Intake 3927.48 ml  Output 2000 ml  Net 1927.48 ml   Filed Weights   01/21/21 0500 01/23/21 0500 01/24/21 0500  Weight: 62.8 kg 63 kg 60.6 kg    Examination: General: Acute on chronically ill looking elderly male, orally intubated HENT: Atraumatic, normocephalic, moist mucous membranes, no JVD.  ETT/OGT in place Lungs: Bilateral rhonchi all over, no wheezes or crackles Cardiovascular: RRR no mrg Abdomen: Soft, non distended, normoactive bowel sounds Extremities: No acute deformity Neuro: Awake, following intermittent commands, moving all 4 extremities spontaneously Skin: dry skin all around forehead  Labs/imaging that I have personally reviewed  (right click and "Reselect all SmartList Selections" daily)  Na 141 K 3.9 Cl 109 Cr 1.28 WBC 7.9 Hgb 12.1  5/9: MRI brain: New subcentimeter acute infarct in right parietal region.  With multiple chronic infarcts  Resolved Hospital Problem list   AKI on CKD stage III A Hypokalemia Hypophosphatemia   Assessment & Plan:   New onset Seizure Acute right parietal stroke EEG showed no seizures, diffuse slowing  consistent with encephalopathy  Continue Keppra Neurology input is appreciated MRI brain confirmed new stroke in right parietal region Echocardiogram, MRA head and neck is pending Continue aspirin, Plavix and atorvastatin Continue to hold wellbutrin  Acute hypoxemic respiratory failure in the setting of seizure and aspiration pneumonia Patient had to be  reintubated because of respiratory distress after he was given trial of extubation yesterday Currently tolerating pressure support trial but he has copious amount of thick secretions Respiratory culture is growing polymicrobial's Started on IV Unasyn yesterday since then he is afebrile and white count is trending down Minimize sedation, currently on Precedex and as needed fentanyl Spoke with patient's brother who is healthcare proxy, decision was made to give him a trial of another extubation in the next day or 2 if he fails then proceed with tracheostomy  Paroxysmal atrial fibrillation: Not on AC HTN Remained in sinus rhythm Telemetry monitoring Restarted on amlodipine and lisinopril Continue to hold HCTZ  Hypertriglyceridemia Propofol was stopped Continue atorvastatin  COPD history documented. No associated home meds listed. No evidence of acute exacerbation.  PRN Duonebs  Best practice (right click and "Reselect all SmartList Selections" daily)  Diet:  NPO.  Tube feeds Pain/Anxiety/Delirium protocol (if indicated): Precedex and as needed fentanyl VAP protocol (if indicated): Yes DVT prophylaxis: Subcutaneous Heparin GI prophylaxis: PPI Glucose control:  SSI No Central venous access:  N/A Arterial line:  N/A Foley:  N/A Mobility:  bed rest  PT consulted: N/A Last date of multidisciplinary goals of care discussion [5/10: Spoke with patient's brother Brett Canales) who is healthcare proxy, decision was made to give him a trial of another extubation in the next day or 2 if he fails then proceed with tracheostomy  Code Status:  full code Disposition: ICU   Total critical care time: 35 minutes  Performed by: Cheri Fowler   Critical care time was exclusive of separately billable procedures and treating other patients.   Critical care was necessary to treat or prevent imminent or life-threatening deterioration.   Critical care was time spent personally by me on the following  activities: development of treatment plan with patient and/or surrogate as well as nursing, discussions with consultants, evaluation of patient's response to treatment, examination of patient, obtaining history from patient or surrogate, ordering and performing treatments and interventions, ordering and review of laboratory studies, ordering and review of radiographic studies, pulse oximetry and re-evaluation of patient's condition.   Cheri Fowler MD [facility] See Amion for pager If no response to pager, please call [phone] until 7pm After 7pm, Please call E-link [phone]

## 2021-01-25 ENCOUNTER — Inpatient Hospital Stay (HOSPITAL_COMMUNITY): Payer: Medicare Other

## 2021-01-25 ENCOUNTER — Encounter (HOSPITAL_COMMUNITY): Payer: Self-pay | Admitting: Critical Care Medicine

## 2021-01-25 DIAGNOSIS — I6330 Cerebral infarction due to thrombosis of unspecified cerebral artery: Secondary | ICD-10-CM | POA: Diagnosis not present

## 2021-01-25 DIAGNOSIS — R569 Unspecified convulsions: Secondary | ICD-10-CM

## 2021-01-25 LAB — GLUCOSE, CAPILLARY
Glucose-Capillary: 112 mg/dL — ABNORMAL HIGH (ref 70–99)
Glucose-Capillary: 133 mg/dL — ABNORMAL HIGH (ref 70–99)
Glucose-Capillary: 67 mg/dL — ABNORMAL LOW (ref 70–99)
Glucose-Capillary: 71 mg/dL (ref 70–99)
Glucose-Capillary: 87 mg/dL (ref 70–99)
Glucose-Capillary: 95 mg/dL (ref 70–99)

## 2021-01-25 LAB — COMPREHENSIVE METABOLIC PANEL
ALT: 54 U/L — ABNORMAL HIGH (ref 0–44)
AST: 56 U/L — ABNORMAL HIGH (ref 15–41)
Albumin: 1.9 g/dL — ABNORMAL LOW (ref 3.5–5.0)
Alkaline Phosphatase: 84 U/L (ref 38–126)
Anion gap: 5 (ref 5–15)
BUN: 31 mg/dL — ABNORMAL HIGH (ref 8–23)
CO2: 24 mmol/L (ref 22–32)
Calcium: 8.2 mg/dL — ABNORMAL LOW (ref 8.9–10.3)
Chloride: 114 mmol/L — ABNORMAL HIGH (ref 98–111)
Creatinine, Ser: 1.19 mg/dL (ref 0.61–1.24)
GFR, Estimated: >60 mL/min (ref >60)
Glucose, Bld: 126 mg/dL — ABNORMAL HIGH (ref 70–99)
Potassium: 3.1 mmol/L — ABNORMAL LOW (ref 3.5–5.1)
Sodium: 143 mmol/L (ref 135–145)
Total Bilirubin: 0.3 mg/dL (ref 0.3–1.2)
Total Protein: 5.4 g/dL — ABNORMAL LOW (ref 6.5–8.1)

## 2021-01-25 LAB — MAGNESIUM: Magnesium: 1.9 mg/dL (ref 1.7–2.4)

## 2021-01-25 LAB — POCT I-STAT 7, (LYTES, BLD GAS, ICA,H+H)
Acid-Base Excess: 1 mmol/L (ref 0.0–2.0)
Bicarbonate: 24.7 mmol/L (ref 20.0–28.0)
Calcium, Ion: 1.19 mmol/L (ref 1.15–1.40)
HCT: 32 % — ABNORMAL LOW (ref 39.0–52.0)
Hemoglobin: 10.9 g/dL — ABNORMAL LOW (ref 13.0–17.0)
O2 Saturation: 100 %
Patient temperature: 98.3
Potassium: 3.6 mmol/L (ref 3.5–5.1)
Sodium: 143 mmol/L (ref 135–145)
TCO2: 26 mmol/L (ref 22–32)
pCO2 arterial: 34.3 mmHg (ref 32.0–48.0)
pH, Arterial: 7.465 — ABNORMAL HIGH (ref 7.350–7.450)
pO2, Arterial: 327 mmHg — ABNORMAL HIGH (ref 83.0–108.0)

## 2021-01-25 LAB — CBC
HCT: 34.5 % — ABNORMAL LOW (ref 39.0–52.0)
Hemoglobin: 11.2 g/dL — ABNORMAL LOW (ref 13.0–17.0)
MCH: 31.4 pg (ref 26.0–34.0)
MCHC: 32.5 g/dL (ref 30.0–36.0)
MCV: 96.6 fL (ref 80.0–100.0)
Platelets: 130 10*3/uL — ABNORMAL LOW (ref 150–400)
RBC: 3.57 MIL/uL — ABNORMAL LOW (ref 4.22–5.81)
RDW: 13.4 % (ref 11.5–15.5)
WBC: 7.9 10*3/uL (ref 4.0–10.5)
nRBC: 0 % (ref 0.0–0.2)

## 2021-01-25 LAB — PHOSPHORUS: Phosphorus: 2.3 mg/dL — ABNORMAL LOW (ref 2.5–4.6)

## 2021-01-25 MED ORDER — VECURONIUM BROMIDE 10 MG IV SOLR
INTRAVENOUS | Status: AC
  Administered 2021-01-25: 10 mg via INTRAVENOUS
  Filled 2021-01-25: qty 10

## 2021-01-25 MED ORDER — DEXTROSE 50 % IV SOLN
12.5000 g | INTRAVENOUS | Status: AC
  Administered 2021-01-25: 12.5 g via INTRAVENOUS

## 2021-01-25 MED ORDER — POTASSIUM CHLORIDE 10 MEQ/100ML IV SOLN
10.0000 meq | INTRAVENOUS | Status: AC
  Administered 2021-01-25 (×2): 10 meq via INTRAVENOUS
  Filled 2021-01-25 (×2): qty 100

## 2021-01-25 MED ORDER — DEXTROSE 50 % IV SOLN
INTRAVENOUS | Status: AC
  Filled 2021-01-25: qty 50

## 2021-01-25 MED ORDER — FOLIC ACID 5 MG/ML IJ SOLN
1.0000 mg | Freq: Once | INTRAMUSCULAR | Status: AC
  Administered 2021-01-25: 1 mg via INTRAVENOUS
  Filled 2021-01-25 (×2): qty 0.2

## 2021-01-25 MED ORDER — PROPOFOL 1000 MG/100ML IV EMUL
5.0000 ug/kg/min | INTRAVENOUS | Status: DC
  Administered 2021-01-25 – 2021-01-26 (×3): 40 ug/kg/min via INTRAVENOUS
  Administered 2021-01-27: 10 ug/kg/min via INTRAVENOUS
  Filled 2021-01-25 (×4): qty 100

## 2021-01-25 MED ORDER — ETOMIDATE 2 MG/ML IV SOLN
INTRAVENOUS | Status: AC
  Administered 2021-01-25: 20 mg via INTRAVENOUS
  Filled 2021-01-25: qty 10

## 2021-01-25 MED ORDER — POTASSIUM PHOSPHATES 15 MMOLE/5ML IV SOLN
30.0000 mmol | Freq: Once | INTRAVENOUS | Status: AC
  Administered 2021-01-25: 30 mmol via INTRAVENOUS
  Filled 2021-01-25: qty 10

## 2021-01-25 MED ORDER — ETOMIDATE 2 MG/ML IV SOLN: 20.0000 mg | Freq: Once | INTRAVENOUS | Status: AC

## 2021-01-25 MED ORDER — VECURONIUM BROMIDE 10 MG IV SOLR: 10.0000 mg | Freq: Once | INTRAVENOUS | Status: AC

## 2021-01-25 MED ORDER — PANTOPRAZOLE SODIUM 40 MG IV SOLR
40.0000 mg | Freq: Once | INTRAVENOUS | Status: AC
  Administered 2021-01-25: 40 mg via INTRAVENOUS
  Filled 2021-01-25: qty 40

## 2021-01-25 MED ORDER — POTASSIUM CHLORIDE 20 MEQ PO PACK
20.0000 meq | PACK | ORAL | Status: DC
Start: 2021-01-25 — End: 2021-01-25
  Administered 2021-01-25: 20 meq
  Filled 2021-01-25: qty 1

## 2021-01-25 NOTE — Consult Note (Signed)
Chief Complaint: Patient was seen in consultation today for right common cartoid artery stenosis  Referring Physician(s): Dr. Pearlean BrownieSethi  Supervising Physician: Baldemar Lenise Macedo Rodrigues, [place]  [facility]  History of Present Illness: Adam Mcclain is a 70 y.o. male with past medical history of a fib on Plavix and aspirin, GERD, HTN, PAD, CVA, COPD, and CKD who [facility] with seizure-like activity. He was intubated in the ED for airway protection and loaded with Keppra.  He was found to have an acute stroke.  Carotid dopplers revealed right common carotid stenosis. NIR consulted for possible angiogram with angioplasty vs. Stent placement.    PA to bedside alongside Dr. Tommie Samsde Macedo Rodrigues.  Patient recently extubated this AM.  Appears debilitated with difficulty with secretions s/p recent extubation.  O2 sats variable, but low 90s on [place]. [place]. Brother at bedside states patient lives at home alone.   Past Medical History:  Diagnosis Date  . A-fib (HCC)   . GERD (gastroesophageal reflux disease)   . HTN (hypertension)   . Nephrolithiasis   . PAD (peripheral artery disease) (HCC)   . Stroke [facility](HCC)     Past Surgical History:  Procedure Laterality Date  . AORTOGRAM    . CATARACT EXTRACTION, BILATERAL      Allergies: Patient has no known allergies.  Medications: Prior to Admission medications   Medication Sig Start Date End Date Taking? Authorizing Provider  ALPRAZolam Prudy Feeler(XANAX) 0.5 MG tablet Take 0.5 mg by mouth 2 (two) times daily. 01/01/21   [provider]  amLODipine (NORVASC) 5 MG tablet Take 5 mg by mouth daily. 01/15/21   [provider]  atorvastatin (LIPITOR) 40 MG tablet Take 40 mg by mouth daily. for cholesterol 01/15/21   [provider]  azelastine (OPTIVAR) 0.05 % ophthalmic solution Place 1 drop into both eyes 2 (two) times daily. 11/23/20   [provider]  cephALEXin (KEFLEX) 500 MG capsule Take 500 mg by  mouth 2 (two) times daily. For seven days 01/01/21   [provider]  citalopram (CELEXA) 20 MG tablet Take 20 mg by mouth daily. 08/23/20   [provider]  DULoxetine (CYMBALTA) 60 MG capsule Take 60 mg by mouth daily. 12/09/20   [provider]  famotidine (PEPCID) 20 MG tablet Take 20 mg by mouth 2 (two) times daily. 08/23/20   [provider]  furosemide (LASIX) 20 MG tablet Take 20 mg by mouth daily. 01/15/21   [provider]  lisinopril (ZESTRIL) 20 MG tablet Take 20 mg by mouth daily. 08/23/20   [provider]     Family History  Problem Relation Age of Onset  . Hypertension Father   . Diabetes Father   . Cancer Father     Social History   Socioeconomic History  . Marital status: Widowed    Spouse name: Not on file  . Number of children: Not on file  . Years of education: Not on file  . Highest education level: Not on file  Occupational History  . Not on file  Tobacco Use  . Smoking status: Current Every Day Smoker  . Smokeless tobacco: Never Used  Substance and Sexual Activity  . Alcohol use: Not Currently  . Drug use: Not on file  . Sexual activity: Not on file  Other Topics Concern  . Not on file  Social History Narrative  . Not on file   Social Determinants of Health   Financial Resource Strain:  Not on file  Food Insecurity: Not on file  Transportation Needs: Not on file  Physical Activity: Not on file  Stress: Not on file  Social Connections: Not on file     Review of Systems: A 12 point ROS discussed and pertinent positives are indicated in the HPI above.  All other systems are negative.  Review of Systems  Unable to perform ROS: Acuity of condition    Vital Signs: BP 130/82   Pulse (!) 57   Temp 100 F (37.8 C) (Oral)   Resp (!) 22   Wt 133 lb 9.6 oz (60.6 kg)   SpO2 99%   Physical Exam Vitals and nursing note reviewed.  Constitutional:      General: He is not in acute distress.     Appearance: Normal appearance. He is ill-appearing.  HENT:     Mouth/Throat:     Mouth: Mucous membranes are moist.     Pharynx: Oropharynx is clear.  Cardiovascular:     Rate and Rhythm: Regular rhythm. Bradycardia present.  Pulmonary:     Effort: No respiratory distress.     Breath sounds: Rhonchi and rales present.  Musculoskeletal:     Cervical back: Normal range of motion and neck supple.  Neurological:     General: No focal deficit present.     Mental Status: He is alert and oriented to person, place, and time. Mental status is at baseline.  Psychiatric:        Mood and Affect: Mood normal.        Behavior: Behavior normal.        Thought Content: Thought content normal.        Judgment: Judgment normal.     Imaging: CT Head Wo Contrast  Result Date: 01/19/2021 CLINICAL DATA:  Found down.  Possible seizure. EXAM: CT HEAD WITHOUT CONTRAST TECHNIQUE: Contiguous axial images were obtained from the base of the skull through the vertex without intravenous contrast. COMPARISON:  CT head December 30, 2020. FINDINGS: Brain: Similar encephalomalacia in the anterolateral right temporal lobe, and bilateral occipital lobes. Similar small remote lacunar infarct versus dilated perivascular space in the left basal ganglia. No evidence of acute large vascular territory infarct. Moderate patchy white matter hypodensities, similar and likely related to chronic microvascular ischemic disease. No acute hemorrhage. Similar ex vacuo ventricular dilation without hydrocephalus. No visible extra-axial fluid collection. No mass lesion or abnormal mass effect. Vascular: No hyperdense vessel.  Calcific atherosclerosis. Skull: No acute fracture. Sinuses/Orbits: Sinuses are largely clear. No acute orbital abnormality. Other: No mastoid effusions. Debris in bilateral external auditory canals, presumably cerumen. Fluid layering in the pharynx, likely related to intubation. IMPRESSION: 1. No evidence of acute  intracranial abnormality. 2. Similar encephalomalacia in the anterolateral right temporal lobe and bilateral occipital lobes. 3. Moderate chronic microvascular ischemic disease. Electronically Signed   By: Feliberto Harts MD   On: 01/19/2021 12:53   MR BRAIN WO CONTRAST  Result Date: 01/22/2021 CLINICAL DATA:  Seizure. EXAM: MRI HEAD WITHOUT CONTRAST TECHNIQUE: Multiplanar, multiecho pulse sequences of the brain and surrounding structures were obtained without intravenous contrast. COMPARISON:  Head CT 01/19/2021 and MRI 03/04/2019 FINDINGS: Brain: There is a moderate-sized chronic right parieto-occipital infarct with associated chronic blood products. Scattered subcentimeter foci of trace diffusion weighted signal hyperintensity in this region are largely felt to be secondary to susceptibility artifact from chronic blood products, however there may be a superimposed subcentimeter acute cortical infarct in the right parietal lobe along the anterosuperior aspect  of the chronic encephalomalacia (series 3, image 36). No acute infarct is identified elsewhere. There is a large chronic left PCA infarct with chronic blood products. There is also encephalomalacia anteriorly in the right temporal lobe which may reflect an old infarct or be secondary to remote trauma. Patchy T2 hyperintensities in the cerebral white matter bilaterally are nonspecific but compatible with moderate chronic small vessel ischemic disease. Chronic lacunar infarcts are noted in the deep white matter of the right cerebral hemisphere and in the left basal ganglia. There is mild to moderate cerebral atrophy. Scattered chronic microhemorrhages are noted in the cerebrum and cerebellum including in the deep gray nuclei and may reflect the sequelae of chronic hypertension. No mass, midline shift, or extra-axial fluid collection is evident. Vascular: Major intracranial vascular flow voids are preserved. Skull and upper cervical spine: Unremarkable bone  marrow signal. Congenital C2-3 fusion. Sinuses/Orbits: Bilateral cataract extraction. Clear paranasal sinuses. Small to moderate bilateral mastoid effusions. Other: Fluid in the pharynx in the setting of intubation. IMPRESSION: 1. Possible subcentimeter acute right parietal cortical infarct along the margin of a chronic parieto-occipital infarct. 2. Large chronic left PCA infarct. 3. Encephalomalacia in the anterior right temporal lobe. 4. Moderate chronic small vessel ischemic disease with multiple chronic lacunar infarcts. Electronically Signed   By: Sebastian Ache M.D.   On: 01/22/2021 16:37   DG CHEST PORT 1 VIEW  Result Date: 01/23/2021 CLINICAL DATA:  Hypoxia EXAM: PORTABLE CHEST 1 VIEW COMPARISON:  Jan 21, 2021 FINDINGS: Endotracheal tube tip is at the carina directed toward the right main bronchus. Nasogastric tube tip and side port in stomach. No edema or airspace opacity. Apparent nipple shadow on the left, stable. Heart size and pulmonary vascularity are normal. No adenopathy. No bone lesions. IMPRESSION: Endotracheal tube tip at carina directed toward the right main bronchus. Advise withdrawing endotracheal tube approximately 3 cm. No edema or airspace opacity. These results will be called to the ordering clinician or representative by the Radiologist Assistant, and communication documented in the PACS or [employer]. Electronically Signed   By: Bretta Bang III M.D.   On: 01/23/2021 09:53   DG CHEST PORT 1 VIEW  Result Date: 01/21/2021 CLINICAL DATA:  Unresponsive, respirator dependent EXAM: PORTABLE CHEST 1 VIEW COMPARISON:  01/19/2021 chest radiograph. FINDINGS: Endotracheal tube tip is 3.0 cm above the carina. Enteric tube enters stomach with the tip not seen on this image. Stable cardiomediastinal silhouette with normal heart size. No pneumothorax. No pleural effusion. Mild medial bibasilar atelectasis, new. No pulmonary edema. IMPRESSION: 1. Well-positioned endotracheal and enteric  tubes. 2. New mild medial bibasilar atelectasis. Electronically Signed   By: Delbert Phenix M.D.   On: 01/21/2021 08:39   DG Chest Portable 1 View  Result Date: 01/19/2021 CLINICAL DATA:  Unresponsive. EXAM: PORTABLE CHEST 1 VIEW COMPARISON:  Chest x-ray dated December 30, 2020. FINDINGS: Endotracheal tube in place with the tip 4.7 cm above the carina. Enteric tube in the stomach. The heart size and mediastinal contours are within normal limits. Normal pulmonary vascularity. No focal consolidation, pleural effusion, or pneumothorax. No acute osseous abnormality. Prior cholecystectomy. IMPRESSION: 1. Appropriately positioned endotracheal and enteric tubes. 2. No active disease. Electronically Signed   By: Obie Dredge M.D.   On: 01/19/2021 12:30   DG Abd Portable 1V  Result Date: 01/20/2021 CLINICAL DATA:  Enteric tube placement. EXAM: PORTABLE ABDOMEN - 1 VIEW COMPARISON:  Chest x-ray 01/19/2021 FINDINGS: Evidence of patient's enteric tube in adequate position with tip just left  of midline over the stomach in the upper abdomen. Surgical clips over the gallbladder fossa. Nonobstructive bowel gas pattern. Remainder the exam is unchanged. IMPRESSION: Enteric tube with tip just left of midline over the stomach in the upper abdomen. Electronically Signed   By: Elberta Fortis M.D.   On: 01/20/2021 10:49   EEG adult  Result Date: 01/19/2021 Charlsie Quest, MD     01/19/2021  1:30 PM Patient Name: ELONZO SOPP MRN: [id number] Epilepsy Attending: Charlsie Quest Referring Physician/Provider: Dr Caryl Pina Date: 01/19/2021 Duration: 27.02 mins Patient history: 70 year old male presenting with GTC seizures. EEG to evaluate for seizure Level of alertness:  lethargic AEDs during EEG study: LEV, Versed Technical aspects: This EEG study was done with scalp electrodes positioned according to the 10-20 International system of electrode placement. Electrical activity was acquired at a sampling rate of  and reviewed with  a high frequency filter of  and a low frequency filter of . EEG data were recorded continuously and digitally stored. Description: EEG showed continuous generalized polymorphic sharply contoured 3 to 6 Hz theta-delta slowing. There is an excessive amount of 15 to 18 Hz beta activity with irregular morphology distributed symmetrically and diffusely. Hyperventilation and photic stimulation were not performed.   ABNORMALITY - Continuous slow, generalized - Excessive beta, generalized IMPRESSION: This study is suggestive of moderate diffuse encephalopathy, nonspecific etiology. The excessive beta activity seen in the background is most likely due to the effect of benzodiazepine and is a benign EEG pattern. No seizures or epileptiform discharges were seen throughout the recording. Priyanka Annabelle Harman   Overnight EEG with video  Result Date: 01/21/2021 Charlsie Quest, MD     01/21/2021  9:34 AM Patient Name: NEVAEH CASILLAS MRN: [id number] Epilepsy Attending: Charlsie Quest Referring Physician/Provider: Dr Caryl Pina Duration: 01/20/2021 0809 to 01/21/2021 0809  Patient history: 70 year old male presenting with GTC seizures. EEG to evaluate for seizure  Level of alertness:  comatose/sedated  AEDs during EEG study: LEV, Versed, propofol  Technical aspects: This EEG study was done with scalp electrodes positioned according to the 10-20 International system of electrode placement. Electrical activity was acquired at a sampling rate of  and reviewed with a high frequency filter of  and a low frequency filter of . EEG data were recorded continuously and digitally stored.  Description: EEG initially showed burst suppression pattern with bursts of low amplitude 15-18hz  beta activity admixed with 5-7hz  theta slowing lasting 1-2 seconds alternating with generalized suppression lasting 2-3 seconds.Gradually, as propofol was weaned off around 1730 on 01/20/2021, eeg showed continuous generalized polymorphic  sharply contoured 3 to 6 Hz theta-delta slowing at times with triphasic morphology. Hyperventilation and photic stimulation were not performed.    ABNORMALITY - Burst suppression, generalized - Continuous slow, generalized  IMPRESSION: This study was initially suggestive of profound diffuse encephalopathy likely due to sedation. As propofol was weaned, eeg was suggestive of moderate to severe diffuse encephalopathy, nonspecific etiology. No seizures or epileptiform discharges were seen throughout the recording.  Charlsie Quest   ECHOCARDIOGRAM COMPLETE  Result Date: 01/24/2021    ECHOCARDIOGRAM REPORT   Patient Name:   BAYRON DALTO Date of Exam: 01/24/2021 Medical Rec #:  [id number]     Height: Accession #:    [id number]    Weight:       133.6 lb Date of Birth:  04-19-1951     BSA:  1.543 m Patient Age:    69 years      BP:           101/69 mmHg Patient Gender: M             HR:           60 bpm. Exam Location:  Inpatient Procedure: 2D Echo Indications:    stroke  History:        Patient has no prior history of Echocardiogram examinations.                 COPD and chronic kidney disease, Arrythmias:Atrial Fibrillation;                 Risk Factors:Hypertension.  Sonographer:    Delcie Roch Referring Phys: [id number] [facility]  Sonographer Comments: Echo performed with patient supine and on artificial respirator and suboptimal parasternal window. Image acquisition challenging due to uncooperative patient. IMPRESSIONS  1. Left ventricular ejection fraction, by estimation, is 60 to 65%. The left ventricle has normal function. The left ventricle has no regional wall motion abnormalities. Left ventricular diastolic parameters were normal.  2. Right ventricular systolic function is normal. The right ventricular size is normal.  3. The mitral valve is normal in structure. No evidence of mitral valve regurgitation. No evidence of mitral stenosis.  4. The aortic valve has an indeterminant number  of cusps. Aortic valve regurgitation is not visualized. No aortic stenosis is present.  5. The inferior vena cava is normal in size with greater than 50% respiratory variability, suggesting right atrial pressure of 3 mmHg. FINDINGS  Left Ventricle: Left ventricular ejection fraction, by estimation, is 60 to 65%. The left ventricle has normal function. The left ventricle has no regional wall motion abnormalities. The left ventricular internal cavity size was normal in size. There is  no left ventricular hypertrophy. Left ventricular diastolic parameters were normal. Right Ventricle: The right ventricular size is normal.Right ventricular systolic function is normal. Left Atrium: Left atrial size was normal in size. Right Atrium: Right atrial size was normal in size. Pericardium: There is no evidence of pericardial effusion. Mitral Valve: The mitral valve is normal in structure. No evidence of mitral valve regurgitation. No evidence of mitral valve stenosis. Tricuspid Valve: The tricuspid valve is normal in structure. Tricuspid valve regurgitation is trivial. No evidence of tricuspid stenosis. Aortic Valve: The aortic valve has an indeterminant number of cusps. Aortic valve regurgitation is not visualized. No aortic stenosis is present. Pulmonic Valve: The pulmonic valve was not well visualized. Pulmonic valve regurgitation is not visualized. No evidence of pulmonic stenosis. Aorta: The aortic root is normal in size and structure. Venous: The inferior vena cava is normal in size with greater than 50% respiratory variability, suggesting right atrial pressure of 3 mmHg. IAS/Shunts: No atrial level shunt detected by color flow Doppler.  LEFT VENTRICLE PLAX 2D LVIDd:         4.00 cm  Diastology LVIDs:         2.50 cm  LV e' medial:    7.40 cm/s LV PW:         0.90 cm  LV E/e' medial:  10.8 LV IVS:        0.80 cm  LV e' lateral:   7.94 cm/s LVOT diam:     1.80 cm  LV E/e' lateral: 10.0 LV SV:         61 LV SV Index:   39  LVOT Area:  2.54 cm  RIGHT VENTRICLE             IVC RV S prime:     16.00 cm/s  IVC diam: 2.00 cm TAPSE (M-mode): 2.5 cm LEFT ATRIUM             Index       RIGHT ATRIUM           Index LA diam:        3.00 cm 1.94 cm/m  RA Area:     14.70 cm LA Vol (A2C):   44.3 ml 28.71 ml/m RA Volume:   37.40 ml  24.24 ml/m LA Vol (A4C):   42.4 ml 27.48 ml/m LA Biplane Vol: 44.0 ml 28.52 ml/m  AORTIC VALVE LVOT Vmax:   116.00 cm/s LVOT Vmean:  71.200 cm/s LVOT VTI:    0.238 m  AORTA Ao Root diam: 2.90 cm MITRAL VALVE MV Area (PHT): 3.37 cm    SHUNTS MV Decel Time: 225 msec    Systemic VTI:  0.24 m MV E velocity: 79.70 cm/s  Systemic Diam: 1.80 cm MV A velocity: 63.80 cm/s MV E/A ratio:  1.25 Olga Millers MD Electronically signed by Olga Millers MD Signature Date/Time: 01/24/2021/4:20:42 PM    Final    VAS US CAROTID  Result Date: 01/23/2021 Carotid Arterial Duplex Study Patient Name:  OLIVERIO CHO  Date of Exam:   01/23/2021 Medical Rec #: [id number]      Accession #:    [id number] Date of Birth: 1950-12-25      Patient Gender: M Patient Age:   69Y Exam Location:  [facility] Procedure:      VAS US CAROTID Referring Phys: [id number] [facility] --------------------------------------------------------------------------------  Indications:       CVA. Risk Factors:      Hypertension, hyperlipidemia, current smoker, prior CVA,                    PAD. Other Factors:     Afib, CKD, COPD. Comparison Study:  No previous exams Performing Technologist: Ernestene Mention  Examination Guidelines: A complete evaluation includes B-mode imaging, spectral Doppler, color Doppler, and power Doppler as needed of all accessible portions of each vessel. Bilateral testing is considered an integral part of a complete examination. Limited examinations for reoccurring indications may be performed as noted.  Right Carotid Findings: +----------+--------+--------+--------+------------------+------------------+           PSV cm/sEDV  cm/sStenosisPlaque DescriptionComments           +----------+--------+--------+--------+------------------+------------------+ CCA Prox  58      20                                intimal thickening +----------+--------+--------+--------+------------------+------------------+ CCA Distal627     144     >50%    heterogenous      intimal thickening +----------+--------+--------+--------+------------------+------------------+ ICA Prox  77      16      1-39%   heterogenous      intimal thickening +----------+--------+--------+--------+------------------+------------------+ ICA Distal51      20                                                   +----------+--------+--------+--------+------------------+------------------+ ECA       318     0       >  50%    heterogenous                         +----------+--------+--------+--------+------------------+------------------+ +----------+--------+-------+----------------+-------------------+           PSV cm/sEDV cmsDescribe        Arm Pressure (mmHG) +----------+--------+-------+----------------+-------------------+ [id number]            Multiphasic, WNL                    +----------+--------+-------+----------------+-------------------+ +---------+--------+--------+-----------------+ VertebralPSV cm/sEDV cm/sSeverely dampened +---------+--------+--------+-----------------+  Left Carotid Findings: +----------+-------+--------+--------+-----------------------+-----------------+           PSV    EDV cm/sStenosisPlaque Description     Comments                    cm/s                                                            +----------+-------+--------+--------+-----------------------+-----------------+ CCA Prox  97     25                                     intimal                                                                   thickening         +----------+-------+--------+--------+-----------------------+-----------------+ CCA Distal98     24              heterogenous           intimal                                                                   thickening        +----------+-------+--------+--------+-----------------------+-----------------+ ICA Prox  269    69      60-79%  heterogenous and                                                          calcific                                 +----------+-------+--------+--------+-----------------------+-----------------+ ICA Distal125    27                                                       +----------+-------+--------+--------+-----------------------+-----------------+  ECA       564    39      >50%    heterogenous                             +----------+-------+--------+--------+-----------------------+-----------------+ +----------+-------+--------+-------------------------------+------------------+           PSV    EDV cm/sDescribe                       Arm Pressure                 cm/s                                          (mmHG)             +----------+-------+--------+-------------------------------+------------------+ Subclavian71     13      Low resistive, post stenotic                                               flow                                              +----------+-------+--------+-------------------------------+------------------+ +---------+--------+--+--------+--+---------+ VertebralPSV cm/s42EDV cm/s13Antegrade +---------+--------+--+--------+--+---------+   Summary: Right Carotid: Velocities in the right ICA are consistent with a 1-39% stenosis.                Hemodynamically significant plaque 80-99% visualized in the CCA.                The ECA appears >50% stenosed. Left Carotid: Velocities in the left ICA are consistent with a 60-79% stenosis.               Non-hemodynamically significant plaque  <50% noted in the CCA. The               ECA appears >50% stenosed. Vertebrals:  Left vertebral artery demonstrates antegrade flow. Right vertebral              artery shows severely dampened waveforms. Subclavians: Normal flow hemodynamics were seen in the right subclavian artery.              Left subclavian artery shows low resistive, post stenotic              waveforms. *See table(s) above for measurements and observations.  Vascular consult recommended. Electronically signed by Gretta Began MD on 01/23/2021 at 9:43:00 PM.    Final    VAS [place] TRANSCRANIAL DOPPLER  Result Date: 01/25/2021  Transcranial Doppler Patient Name:  ELVIN MCCARTIN  Date of Exam:   01/25/2021 Medical Rec #: [id number]      Accession #:    [id number] Date of Birth: [birth year]      Patient Gender: M Patient Age:   69Y Exam Location:  [facility] Procedure:      VAS [place] TRANSCRANIAL DOPPLER Referring Phys: [id number] DELILA A BAILEY-MODZIK --------------------------------------------------------------------------------  Indications: Stroke. Comparison Study: no prior Performing Technologist: Blanch Media RVS  Examination Guidelines: A complete evaluation includes B-mode imaging, spectral Doppler, color Doppler, and power  Doppler as needed of all accessible portions of each vessel. Bilateral testing is considered an integral part of a complete examination. Limited examinations for reoccurring indications may be performed as noted.  +----------+-------------+----------+-----------+-------+ RIGHT TCD Right VM (cm)Depth (cm)PulsatilityComment +----------+-------------+----------+-----------+-------+ MCA           58.00                 1.10            +----------+-------------+----------+-----------+-------+ ACA          -32.00                 0.88            +----------+-------------+----------+-----------+-------+ Term ICA      43.00                 1.01             +----------+-------------+----------+-----------+-------+ PCA           35.00                 0.60            +----------+-------------+----------+-----------+-------+ Opthalmic     11.00                 2.08            +----------+-------------+----------+-----------+-------+ ICA siphon    46.00                 1.27            +----------+-------------+----------+-----------+-------+ Vertebral    -28.00                 0.99            +----------+-------------+----------+-----------+-------+  +----------+------------+----------+-----------+------------------+ LEFT TCD  Left VM (cm)Depth (cm)Pulsatility     Comment       +----------+------------+----------+-----------+------------------+ MCA          20.00                 0.79                       +----------+------------+----------+-----------+------------------+ ACA          -23.00                0.61                       +----------+------------+----------+-----------+------------------+ Term ICA                                   unable to insonate +----------+------------+----------+-----------+------------------+ PCA                                        unable to insonate +----------+------------+----------+-----------+------------------+ Opthalmic    17.00                 1.17                       +----------+------------+----------+-----------+------------------+ ICA siphon   53.00                 1.18                       +----------+------------+----------+-----------+------------------+  Vertebral    -24.00                1.18                       +----------+------------+----------+-----------+------------------+  +------------+-------+-------+             VM cm/sComment +------------+-------+-------+ Dist Basilar-20.00         +------------+-------+-------+ Summary:  Poor left temporal window limits evaluation of anetrior circulation on left otherwise normal mean flow  velocities in majority of identified vessels of anterior and posterior cerebral circulation. *See table(s) above for TCD measurements and observations.  Diagnosing physician: Delia Heady MD Electronically signed by Delia Heady MD on 01/25/2021 at 11:58:40 AM.    Final     Labs:  CBC: Recent Labs    01/20/21 0443 01/21/21 0549 01/22/21 0400 01/25/21 0414  WBC 11.0* 12.2* 7.9 7.9  HGB 14.4 11.3* 12.1* 11.2*  HCT 42.3 34.0* 35.9* 34.5*  PLT 190 125* 110* 130*    COAGS: No results for input(s): INR, APTT in the last 8760 hours.  BMP: Recent Labs    01/21/21 0549 01/22/21 0400 01/23/21 0433 01/25/21 0414  NA 140 140 141 143  K 3.1* 3.8 3.9 3.1*  CL 112* 113* 109 114*  CO2 21* 21* 24 24  GLUCOSE 123* 175* 157* 126*  BUN 31*  CALCIUM 7.5* 8.3* 8.5* 8.2*  CREATININE 1.51* 1.28* 1.28* 1.19  GFRNONAA 49* >60 >60 >60    LIVER FUNCTION TESTS: Recent Labs    01/19/21 1158 01/25/21 0414  BILITOT 0.7 0.3  AST 27 56*  ALT 20 54*  ALKPHOS 122 84  PROT 6.5 5.4*  ALBUMIN 3.8 1.9*    TUMOR MARKERS: No results for input(s): AFPTM, CEA, CA199, CHROMGRNA in the last 8760 hours.  Assessment and Plan: Subcentimeter acute right parietal cortical infarct Carotid stenosis IR consulted for angioplasty vs. Stenting of carotid stenosis in patient with history of prior CVAs.  Mr. Boughner is assessed at bedside this AM.  He found to be acutely ill, recently extubated with increased secretions.  Hopefully he will be able to maintain his airway and avoid trach consideration.  O2 sats variable, but maintaining 93-94% on nasal cannula.  BP 85/61 s/p weaning from precedex drip and extubation. HR 57.  Appears weak.  He does have hemodynamically significant plaque 80-99% in the right CCA which may warrant treatment once he is medically stable.  At this time acute medical recovery is paramount and NIR will monitor for status, disposition, and potential discharge needs in order to  coordinate possible angiogram/intervention.  He has resumed his aspirin and Plavix at this time.  NIR following.   Thank you for this interesting consult.  I greatly enjoyed meeting RAMELO OETKEN and look forward to participating in their care.  A copy of this report was sent to the requesting provider on this date.  Electronically Signed: Hoyt Koch, PA 01/25/2021, 12:52 PM   I spent a total of 40 Minutes    in face to face in clinical consultation, greater than 50% of which was counseling/coordinating care for right common carotid stenosis.

## 2021-01-25 NOTE — Progress Notes (Signed)
TCD has been completed.   Preliminary results in CV Proc.   Blanch Media 01/25/2021 11:48 AM

## 2021-01-25 NOTE — Procedures (Signed)
Intubation Procedure Note  Adam Mcclain  [id number]  August 08, 1951  Date:01/25/21  Time:4:10 PM   Provider Performing:Delberta Folts    Procedure: Intubation (31500)  Indication(s) Respiratory Failure  Consent Risks of the procedure as well as the alternatives and risks of each were explained to the patient and/or caregiver.  Consent for the procedure was obtained and is signed in the bedside chart   Anesthesia Etomidate and Rocuronium   Time Out Verified patient identification, verified procedure, site/side was marked, verified correct patient position, special equipment/implants available, medications/allergies/relevant history reviewed, required imaging and test results available.   Sterile Technique Usual hand hygeine, masks, and gloves were used   Procedure Description Patient positioned in bed supine.  Sedation given as noted above.  Patient was intubated with endotracheal tube using DL.  View was Grade 3 only epiglottis .  Number of attempts was 1.  Colorimetric CO2 detector was consistent with tracheal placement.   Complications/Tolerance None; patient tolerated the procedure well. Chest X-ray is ordered to verify placement.   EBL Minimal   Specimen(s) None

## 2021-01-25 NOTE — Progress Notes (Signed)
STROKE TEAM PROGRESS NOTE  [name] is a 70 y/o gentleman with a history of previous CVAs who presented after being found unresponsive and apparently having a seizure in his car by a stranger. He was waiting in the car during a family member's doctor's appointment when he was found. He was given midazolam by EMS and BVM to the ED where he was intubated. He had a keppra load in the ED and an EEG performed showing no seizures. He had some LE shaking during the EEG which was not associated with seizure activity on EEG. Stroke found on work up.   INTERVAL HISTORY No visitors at bedside Patient recently extubated about 10 min ago, Awake and following simple commands, severely hypophonic, attempting to speak but mostly incomprehensible.  Explained his stroke diagnosis, carotid stenosis, seizure and plan of care recommendations. Unable to assess his understanding of this conversation due to above communication deficits but will continue to reiterate. He was attentive and did attempt to answer questions.    Vitals:   01/25/21 0600 01/25/21 0800 01/25/21 0817 01/25/21 0819  BP: 128/84  130/82   Pulse: (!) 58  (!) 57   Resp: 19  (!) 22   Temp:  97.8 F (36.6 C)    TempSrc:  Axillary    SpO2: 97%  98% 96%  Weight:       CBC:  Recent Labs  Lab 01/19/21 1158 01/19/21 1244 01/21/21 0549 01/22/21 0400 01/25/21 0414  WBC 7.2   < > 12.2* 7.9 7.9  NEUTROABS 5.5  --  10.5*  --   --   HGB 16.3   < > 11.3* 12.1* 11.2*  HCT 47.9   < > 34.0* 35.9* 34.5*  MCV 94.3   < > 97.4 95.5 96.6  PLT 165   < > 125* 110* 130*   < > = values in this interval not displayed.   Basic Metabolic Panel:  Recent Labs  Lab 01/22/21 0400 01/23/21 0433 01/25/21 0414  NA 140 141 143  K 3.8 3.9 3.1*  CL 113* 109 114*  CO2 21* 24 24  GLUCOSE 175* 157* 126*  BUN 18 22 31*  CREATININE 1.28* 1.28* 1.19  CALCIUM 8.3* 8.5* 8.2*  MG 2.0  --  1.9  PHOS 2.3* 2.6 2.3*   Lipid Panel:  Recent Labs  Lab 01/23/21 1215   CHOL 118  TRIG 298*  HDL 15*  CHOLHDL 7.9  VLDL 60*  LDLCALC 43   HgbA1c:  Recent Labs  Lab 01/23/21 1215  HGBA1C 5.3   Urine Drug Screen:  Recent Labs  Lab 01/19/21 1237  LABOPIA NONE DETECTED  COCAINSCRNUR NONE DETECTED  LABBENZ POSITIVE*  AMPHETMU NONE DETECTED  THCU NONE DETECTED  LABBARB NONE DETECTED    Alcohol Level  Recent Labs  Lab 01/19/21 1158  ETH <10   IMAGING and DIAGNOSTICS  MRI 1. Possible subcentimeter acute right parietal cortical infarct along the margin of a chronic parieto-occipital infarct. 2. Large chronic left PCA infarct. 3. Encephalomalacia in the anterior right temporal lobe. 4. Moderate chronic small vessel ischemic disease with multiple chronic lacunar infarcts.  CT head  1. No evidence of acute intracranial abnormality. 2. Similar encephalomalacia in the anterolateral right temporal lobe and bilateral occipital lobes. 3. Moderate chronic microvascular ischemic disease.  Carotid Doppler Study Right Carotid: Velocities in the right ICA are consistent with a 1-39% stenosis. Hemodynamically significant plaque 80-99% visualized in the CCA.  The ECA appears >50% stenosed.   Left Carotid:  Velocities in the left ICA are consistent with a 60-79%  stenosis. Non-hemodynamically significant plaque <50% noted in the CCA. The ECA appears >50% stenosed.   Vertebrals: Left vertebral artery demonstrates antegrade flow. Right Vertebral artery shows severely dampened waveforms.  Subclavians: Normal flow hemodynamics were seen in the right subclavian  artery.  Left subclavian artery shows low resistive, post stenotic waveforms.   PHYSICAL EXAM General:  Pleasant frail elderly Caucasian male laying comfortably in bed;  HENT: Normal oropharynx and mucosa. Normal external appearance of ears and nose. Neck: Supple, no pain or tenderness CV: No JVD. No peripheral edema. Pulmonary: Symmetric Chest rise. Normal respiratory effort. Abdomen: Soft  to touch, non-tender. Ext: No cyanosis, edema, or deformity Skin: No rash. Normal palpation of skin.  Musculoskeletal: Normal digits and nails by inspection. No clubbing.  Neurologic Examination  Mental status/Cognition:  Is awake and alert.  Follows commands well.  Severe hypophonia and dysarthria following extubation but can be understood with some difficulty Speech/language:  No aphasia but hypophonic dysarthria Cranial nerves:   CN II Pupils equal and reactive to light, blinks to threat BL   CN III,IV,VI EOM intact, no gaze preference or deviation, no nystagmus   CN V    CN VII Symmetric facial smile.   CN VIII Turns head towards speech.   CN IX & X    CN XI    CN XII    Motor:  Muscle bulk: poor, tone normal.  Able to raise all 4 extremities up against gravity without focal weakness. Unable to do detailes strength testinf but holds BL arms up off the bed and holds BL legs up off the bed.  ASSESSMENT/PLAN Adam Mcclain is a 70 year old male with a PMHx ofCVA, HTN, atrialfibrillation on aspirin and Plavix, COPD, and CKDpresenting with GTC seizures found to have a possible subcentimeter acute right parietal cortical infarct.  Possible subcentimeter acute right parietal cortical infarct along the margin of a chronic parieto-occipital infarct -likely clinically silent as the semiology of his seizures seems more related to his old left PCA infarct.  Etiology of both strokes likely atrial fibrillation   Code Stroke:  No evidence of acute intracranial abnormality.  MRI  Possible subcentimeter acute right parietal cortical infarct along the margin of a chronic parieto-occipital infarct. Large chronic left PCA infarct and multiple chronic lacunar infarcts.   TCD bubble study is pending  Carotid Doppler:  Hemodynamically significant plaque 80-99% visualized in the CCA on the right. Will need consideration for revascularization when more stable/appropriate. Old chart is now  available pending merge showing history of right CEA 2017   2D Echo pending  VTE prophylaxis - heparin 5000 units ppx     Diet   Diet NPO time specified    Currently on DAPT, final plan is pending more information  Therapy recommendations: TBD  Disposition:  TBD  A fib not on AC  A fib history but evidently not on anticoagulant but on DAPT prior to admission. Attempted to reach daughter and brother by phone to discuss or at least obtain PCP name/contact info. Neither answered their phones. Voicemails left. Dr. Derry Lory also tried yesterday. With pharmacist's help have identified Jeffry Vogelsang of [facility] as a prescriber so will reach out to obtain history.   Old chart for merge has been identified with Dr. Donzetta Sprung as PCP. I have left a message with his nurse trying to clarify anticoagulation history.   Sinus rhythm currently  Seizure  Unclear if history  of seizures.   cEEG with Triphasics 2-3 Hz on ictal-interictal spectrum but no seizures.   Also notable for moderate to severe diffuse encephalopathy.   cEEG was disontinued after LTM for more than 48 hours with no seizures.  Continue Keppra 1000mg  BID IV  Hypertension  Home meds:  Norvasc 5, Lasix 20mg , Zestril 20mg , all daily  Permissive hypertension (OK if < 220/120) but gradually normalize in 5-7 days . Long-term BP goal normotensive  Acute hypoxic resp failure  Aspiration PNA  Management per CCM, appreciated  Extubated 5/12  +Moraxella catarrhalis on resp culture. CCM planning IV Unasyn x 7 days.   Hyperlipidemia  Home meds:  Lipitor 40mg    LDL 43, goal < 70  Continue statin at discharge  Dysphagia Feeding   NPO  On TF @55cc /hr  Diabetes type II Controlled  HgbA1c 5.3, goal < 7.0  CBGs Recent Labs    01/24/21 2340 01/25/21 0359 01/25/21 0806  GLUCAP 142* 133* 112*      SSI  Other Stroke Risk Factors  Advanced Age >/= 44   Order placed to obtain weight and record  Large  chronic left PCA infarct and multiple chronic lacunar infarcts.  Other Active Problems  Hypokalemia/hypophosphatemia: monitor and replete, CCM appreciated   Hospital day # 6  I have personally obtained history,examined this patient, reviewed notes, independently viewed imaging studies, participated in medical decision making and plan of care.ROS completed by me personally and pertinent positives fully documented  I have made any additions or clarifications directly to the above note. Agree with note above.  Continue mobilization out of bed.  Therapy consults.  Speech therapy for swallow eval.  Consult interventional neuroradiology to discuss right carotid revascularization due to stent.  Continue Keppra for seizures and dual antiplatelet therapy for high-grade right carotid stenosis and patient is able to swallow..  Discussed with Dr. critical care medicine  This patient is critically ill and at significant risk of neurological worsening, death and care requires constant monitoring of vital signs, hemodynamics,respiratory and cardiac monitoring, extensive review of multiple databases, frequent neurological assessment, discussion with family, other specialists and medical decision making of high complexity.I have made any additions or clarifications directly to the above note.This critical care time does not reflect procedure time, or teaching time or supervisory time of PA/NP/Med Resident etc but could involve care discussion time.  I spent 30 minutes of neurocritical care time  in the care of  this patient.     03/26/21, MD Medical Director [facility] Pager: [phone] 01/25/2021 3:44 PM    To contact Stroke Continuity provider, please refer to Merrily Pew. After hours, contact General Neurology

## 2021-01-25 NOTE — Progress Notes (Signed)
NAME:  Adam Mcclain, MRN:  [id number], DOB:  1950/11/02, LOS: 6 ADMISSION DATE:  01/19/2021, CONSULTATION DATE: 5/6 REFERRING MD: Dr. Rubin Payor, CHIEF COMPLAINT: Seizure  History of Present Illness:  70 year old male with past medical history as below, which is significant for CVA, HTN, afibrillation on aspirin and Plavix, COPD, and CKD.  In the midday hours of 5/6 he had accompanied by family member to a doctor's appointment.  He was waiting in the car when a bystander walked by and noticed he had a gaze deviation and was shaking.  EMS was called.  Upon EMS arrival the patient was unresponsive and was administered 10 mg of Versed.  He was bagged in route to the emergency department where he was promptly intubated on presentation.  The ED physician did note some rigidity in the upper extremities concerning for seizure activity.  CT of the head was nonacute.  The patient was evaluated by neurology who provided a loading dose of Keppra and started EEG monitoring.  PCCM was asked to admit the patient due to the requirement of mechanical ventilation.  Significant Hospital Events: Including procedures, antibiotic start and stop dates in addition to other pertinent events   . 5/6 admit to [facility] for seizures on the ventilator. . 5/7 EEG shows no seizure activity . 5/8 propofol was turned off, patient is spiked fever but white count trended down . 5/9 EEG was negative for seizures, MRI was done showed very small right parietal stroke . 5/10 trial of extubation, patient is struggled had to be reintubated . 5/11 MRI brain showed subcentimeter new stroke . 5/12 tolerated pressure support, extubated  Interim History / Subjective:  Patient tolerated pressure support, extubated this morning Overnight he was very agitated requiring increasing doses of Precedex and as needed fentanyl  Objective   Blood pressure 130/82, pulse (!) 57, temperature 97.8 F (36.6 C), temperature source Axillary, resp. rate  (!) 22, weight 60.6 kg, SpO2 96 %.    Vent Mode: PSV;CPAP FiO2 (%):  [40 %] 40 % Set Rate:  [18 bmp] 18 bmp Vt Set:  [580 mL] 580 mL PEEP:  [5 cmH20] 5 cmH20 Pressure Support:  [8 cmH20-12 cmH20] 12 cmH20 Plateau Pressure:  [17 cmH20-24 cmH20] 18 cmH20   Intake/Output Summary (Last 24 hours) at 01/25/2021 2025 Last data filed at 01/25/2021 0600 Gross per 24 hour  Intake 1710.1 ml  Output 1550 ml  Net 160.1 ml   Filed Weights   01/21/21 0500 01/23/21 0500 01/24/21 0500  Weight: 62.8 kg 63 kg 60.6 kg    Examination: General: Acute on chronically ill looking elderly male, lying on the bed HENT: Atraumatic, normocephalic, moist mucous membranes, no JVD Lungs: Bilateral rhonchi all over, no wheezes or crackles Cardiovascular: RRR no mrg Abdomen: Soft, non distended, normoactive bowel sounds Extremities: No acute deformity Neuro: Awake, following intermittent commands, moving all 4 extremities spontaneously Skin: dry skin all around forehead  Labs/imaging that I have personally reviewed  (right click and "Reselect all SmartList Selections" daily)  Na 143 K 3.1 Cl 114 Cr 1.19 WBC 7.9 Hgb 11.2  Respiratory culture: Grew Moraxella catarrhalis 5/9: MRI brain: New subcentimeter acute infarct in right parietal region.  With multiple chronic infarcts  Resolved Hospital Problem list   AKI on CKD stage III A  Assessment & Plan:   New onset Seizure Acute right parietal stroke EEG showed no seizures, diffuse slowing consistent with encephalopathy  Continue Keppra MRI brain confirmed new stroke in right parietal region  Echocardiogram showed normal EF with no wall motion abnormalities MRA head and neck is pending He may need TEE before discharge Continue aspirin, Plavix and atorvastatin Continue to hold wellbutrin  Acute hypoxemic respiratory failure in the setting of seizure and aspiration pneumonia with Moraxella catarrhalis Patient tolerated pressure support trial this  morning, extubated So far doing well on nasal cannula oxygen Respiratory culture grew Moraxella Continue IV Unasyn for total of 7 days Titrate oxygen with O2 sat goal of 92 to 95%  Paroxysmal atrial fibrillation: Not on AC HTN Remained in sinus rhythm Continue amlodipine and lisinopril Continue to hold HCTZ  Hypokalemia/hypophosphatemia Continue aggressive electrolyte supplements  Hypertriglyceridemia Continue atorvastatin  COPD history documented. No associated home meds listed. No evidence of acute exacerbation.  PRN Duonebs  Best practice (right click and "Reselect all SmartList Selections" daily)  Diet:  NPO.  Speech and swallow evaluation Pain/Anxiety/Delirium protocol (if indicated): N/A VAP protocol (if indicated): N/A DVT prophylaxis: Subcutaneous Heparin GI prophylaxis: PPI Glucose control:  SSI No Central venous access:  N/A Arterial line:  N/A Foley:  N/A Mobility: Out of bed to chair PT consulted: Yes Last date of multidisciplinary goals of care discussion [5/10: Spoke with patient's brother Brett Canales) who is healthcare proxy, decision was made to give him a trial of another extubation in the next day or 2 if he fails then proceed with tracheostomy  Code Status:  full code Disposition: ICU   Total critical care time: 33 minutes  Performed by: Cheri Fowler   Critical care time was exclusive of separately billable procedures and treating other patients.   Critical care was necessary to treat or prevent imminent or life-threatening deterioration.   Critical care was time spent personally by me on the following activities: development of treatment plan with patient and/or surrogate as well as nursing, discussions with consultants, evaluation of patient's response to treatment, examination of patient, obtaining history from patient or surrogate, ordering and performing treatments and interventions, ordering and review of laboratory studies, ordering and review of  radiographic studies, pulse oximetry and re-evaluation of patient's condition.   Cheri Fowler MD [facility] See Amion for pager If no response to pager, please call [phone] until 7pm After 7pm, Please call E-link [phone]

## 2021-01-25 NOTE — Progress Notes (Signed)
On 3 PM patient was noted to be tachypneic, struggling with weak cough unable to bring up phlegm, he was getting tired Decision was made to proceed with endotracheal intubation and mechanical ventilation Patient's brother was updated, he was consented for tracheostomy tomorrow at 55

## 2021-01-25 NOTE — Procedures (Signed)
Extubation Procedure Note  Patient Details:   Name: Adam Mcclain DOB: Dec 26, 1950 MRN: [id number]   Airway Documentation:    Vent end date: 01/25/21 Vent end time: 0859   Evaluation  O2 sats: stable throughout Complications: No apparent complications Patient did tolerate procedure well. Bilateral Breath Sounds: Rhonchi   Yes   Patient extubated per order to 4L  with no apparent complications. Positive cuff leak was noted prior to extubation. Patient is alert and is able to speak. Vitals are stable. Rt will continue to monitor.   Kadarious Dikes Lajuana Ripple 01/25/2021, 9:38 AM

## 2021-01-26 DIAGNOSIS — E43 Unspecified severe protein-calorie malnutrition: Secondary | ICD-10-CM | POA: Insufficient documentation

## 2021-01-26 DIAGNOSIS — I6330 Cerebral infarction due to thrombosis of unspecified cerebral artery: Secondary | ICD-10-CM | POA: Diagnosis not present

## 2021-01-26 LAB — GLUCOSE, CAPILLARY
Glucose-Capillary: 100 mg/dL — ABNORMAL HIGH (ref 70–99)
Glucose-Capillary: 114 mg/dL — ABNORMAL HIGH (ref 70–99)
Glucose-Capillary: 121 mg/dL — ABNORMAL HIGH (ref 70–99)
Glucose-Capillary: 125 mg/dL — ABNORMAL HIGH (ref 70–99)
Glucose-Capillary: 156 mg/dL — ABNORMAL HIGH (ref 70–99)
Glucose-Capillary: 193 mg/dL — ABNORMAL HIGH (ref 70–99)
Glucose-Capillary: 65 mg/dL — ABNORMAL LOW (ref 70–99)
Glucose-Capillary: 87 mg/dL (ref 70–99)

## 2021-01-26 LAB — TRIGLYCERIDES: Triglycerides: 309 mg/dL — ABNORMAL HIGH (ref <150)

## 2021-01-26 MED ORDER — ONDANSETRON HCL 4 MG/2ML IJ SOLN
4.0000 mg | Freq: Four times a day (QID) | INTRAMUSCULAR | Status: DC | PRN
  Administered 2021-01-26: 4 mg via INTRAVENOUS
  Filled 2021-01-26: qty 2

## 2021-01-26 MED ORDER — DEXAMETHASONE SODIUM PHOSPHATE 4 MG/ML IJ SOLN
4.0000 mg | Freq: Four times a day (QID) | INTRAMUSCULAR | Status: AC
  Administered 2021-01-26 – 2021-01-27 (×4): 4 mg via INTRAVENOUS
  Filled 2021-01-26 (×4): qty 1

## 2021-01-26 MED ORDER — POTASSIUM CHLORIDE 10 MEQ/100ML IV SOLN
10.0000 meq | INTRAVENOUS | Status: AC
  Administered 2021-01-26 (×4): 10 meq via INTRAVENOUS
  Filled 2021-01-26 (×4): qty 100

## 2021-01-26 MED ORDER — PROSOURCE TF PO LIQD: 90.0000 mL | Freq: Two times a day (BID) | ORAL | Status: DC

## 2021-01-26 MED ORDER — DEXTROSE 50 % IV SOLN
12.5000 g | INTRAVENOUS | Status: AC
  Administered 2021-01-26: 12.5 g via INTRAVENOUS

## 2021-01-26 MED ORDER — DEXTROSE 10 % IV SOLN: INTRAVENOUS | Status: DC

## 2021-01-26 MED ORDER — OSMOLITE 1.5 CAL PO LIQD
1000.0000 mL | ORAL | Status: DC
  Administered 2021-01-26 – 2021-02-01 (×7): 1000 mL
  Filled 2021-01-26 (×3): qty 1000

## 2021-01-26 MED ORDER — ADULT MULTIVITAMIN W/MINERALS CH
1.0000 | ORAL_TABLET | Freq: Every day | ORAL | Status: DC
  Administered 2021-01-27 – 2021-02-02 (×7): 1
  Filled 2021-01-26 (×7): qty 1

## 2021-01-26 MED ORDER — PROSOURCE TF PO LIQD
45.0000 mL | Freq: Two times a day (BID) | ORAL | Status: DC
  Administered 2021-01-26 – 2021-02-02 (×14): 45 mL
  Filled 2021-01-26 (×15): qty 45

## 2021-01-26 NOTE — Progress Notes (Signed)
eLink Physician-Brief Progress Note Patient Name: KARIS EMIG DOB: 1951-01-14 MRN: [id number]   Date of Service  01/26/2021  HPI/Events of Note  Hypglycemia episodes X 2  - Blood glucose 65 and 67. Treated with D50.  eICU Interventions  Plan: 1. D10W IV infusion at 40 mL/hour.     Intervention Category Major Interventions: Other:  Lenell Antu 01/26/2021, 5:57 AM

## 2021-01-26 NOTE — Progress Notes (Signed)
Pt self self extubated and was placed on NRB. Pt has strong congested cough post extubation but is confused and unable to use [place] at this time

## 2021-01-26 NOTE — Progress Notes (Signed)
SLP Cancellation Note  Patient Details Name: Adam Mcclain MRN: [id number] DOB: 04/26/1951   Cancelled treatment:        Order received. On vent and having trach today. Will follow/    [name] 01/26/2021, 7:56 AM   Breck Coons Lonell Face.Ed Nurse, children's [phone] Office [phone]

## 2021-01-26 NOTE — Progress Notes (Addendum)
Nutrition Follow-up  DOCUMENTATION CODES:   Severe malnutrition in context of chronic illness  INTERVENTION:   Tube feeding via Cortrak tube: Osmolite 1.5 at 55 ml/h (1320 ml per day) Prosource TF 45 ml BID  Provides 2060 kcal, 104 gm protein, 1003 ml free water daily  D/C Liquid MVI, this is incomplete MVI with minerals daily per tube  NUTRITION DIAGNOSIS:   Severe Malnutrition related to chronic illness (COPD, CKD) as evidenced by severe muscle depletion,mild fat depletion. Ongoing.   GOAL:   Patient will meet greater than or equal to 90% of their needs Met with TF.   MONITOR:   TF tolerance,Diet advancement  REASON FOR ASSESSMENT:   Ventilator,Consult Enteral/tube feeding initiation and management  ASSESSMENT:   70 yo male admitted with new onset seizures, acute respiratory failure requiring intubation.PMH includes stroke, HTN, GERD, afib, COPD, CKD  Pt discussed during ICU rounds and with RN.   5/10 failed extubation 5/12 failed extubation 5/13 self-extubated; cortrak placed with tip gastric   Medications reviewed and include: vitamin M09, decadron, folic acid, SSI, liquid MVI, thiamine D10 @ 40 ml/hr 10 mEq KCl every hr x 4  Labs reviewed: TG: 309, K+ 3.1 CBG's: 65-121   NUTRITION - FOCUSED PHYSICAL EXAM:  Flowsheet Row Most Recent Value  Orbital Region Moderate depletion  Upper Arm Region Severe depletion  Thoracic and Lumbar Region Severe depletion  Buccal Region Moderate depletion  Temple Region Moderate depletion  Clavicle Bone Region Moderate depletion  Clavicle and Acromion Bone Region Severe depletion  Scapular Bone Region Severe depletion  Dorsal Hand Severe depletion  Patellar Region Severe depletion  Anterior Thigh Region Severe depletion  Posterior Calf Region Severe depletion  Edema (RD Assessment) None  Hair Reviewed  [matted]  Eyes Reviewed  Mouth Reviewed  [poor dentition]  Skin Reviewed  Nails Reviewed       Diet  Order:   Diet Order            Diet NPO time specified  Diet effective now                 EDUCATION NEEDS:   No education needs have been identified at this time  Skin:  Skin Assessment: Skin Integrity Issues: Skin Integrity Issues:: Stage I Stage I: 2 places on forehead  Last BM:  unmeasured BM x 10; rectal tube placed 5/12  Height:   Ht Readings from Last 1 Encounters:  No data found for Ht    Weight:   Wt Readings from Last 1 Encounters:  01/24/21 60.6 kg    Ideal Body Weight:     BMI:  There is no height or weight on file to calculate BMI.  Estimated Nutritional Needs:   Kcal:  1800-2000  Protein:  95-115 grams  Fluid:  >/= 2L  Sotirios Navarro P., RD, LDN, CNSC See AMiON for contact information

## 2021-01-26 NOTE — Procedures (Signed)
Cortrak  Person Inserting Tube:  Leanna Hamid E, RD Tube Type:  Cortrak - 43 inches Tube Location:  Left nare Initial Placement:  Stomach Secured by: Bridle Technique Used to Measure Tube Placement:  Documented cm marking at nare/ corner of mouth Cortrak Secured At:  65 cm    Cortrak Tube Team Note:  Consult received to place a Cortrak feeding tube.   No x-ray is required. RN may begin using tube.    If the tube becomes dislodged please keep the tube and contact the Cortrak team at [email or web address] (password [email or web address]) for replacement.  If after hours and replacement cannot be delayed, place a NG tube and confirm placement with an abdominal x-ray.    Allison Silva, MS, RD, LDN RD pager number and weekend/on-call pager number located in Amion.   

## 2021-01-26 NOTE — Progress Notes (Signed)
STROKE TEAM PROGRESS NOTE     INTERVAL HISTORY Patient had been extubated yesterday morning but developed stridor requiring reintubation.  He self extubated himself this morning.  He is currently on nonrebreather but awake and arousable and follows commands well.  Moves all 4 extremities equally.  No focal deficits.   Vitals:   01/26/21 0800 01/26/21 0900 01/26/21 1000 01/26/21 1100  BP: 122/75 (!) 128/94 114/80 (!) 120/92  Pulse: 78 68 73 80  Resp: (!) 23 (!) 28 (!) 26 (!) 37  Temp: 98.6 F (37 C)     TempSrc: Axillary     SpO2: 100% 100% 99% 100%  Weight:       CBC:  Recent Labs  Lab 01/19/21 1158 01/19/21 1244 01/21/21 0549 01/22/21 0400 01/25/21 0414 01/25/21 1621  WBC 7.2   < > 12.2* 7.9 7.9  --   NEUTROABS 5.5  --  10.5*  --   --   --   HGB 16.3   < > 11.3* 12.1* 11.2* 10.9*  HCT 47.9   < > 34.0* 35.9* 34.5* 32.0*  MCV 94.3   < > 97.4 95.5 96.6  --   PLT 165   < > 125* 110* 130*  --    < > = values in this interval not displayed.   Basic Metabolic Panel:  Recent Labs  Lab 01/22/21 0400 01/23/21 0433 01/25/21 0414 01/25/21 1621  NA 140 141 143 143  K 3.8 3.9 3.1* 3.6  CL 113* 109 114*  --   CO2 21* 24 24  --   GLUCOSE 175* 157* 126*  --   BUN 18 22 31*  --   CREATININE 1.28* 1.28* 1.19  --   CALCIUM 8.3* 8.5* 8.2*  --   MG 2.0  --  1.9  --   PHOS 2.3* 2.6 2.3*  --    Lipid Panel:  Recent Labs  Lab 01/23/21 1215 01/26/21 0436  CHOL 118  --   TRIG 298* 309*  HDL 15*  --   CHOLHDL 7.9  --   VLDL 60*  --   LDLCALC 43  --    HgbA1c:  Recent Labs  Lab 01/23/21 1215  HGBA1C 5.3   Urine Drug Screen:  Recent Labs  Lab 01/19/21 1237  LABOPIA NONE DETECTED  COCAINSCRNUR NONE DETECTED  LABBENZ POSITIVE*  AMPHETMU NONE DETECTED  THCU NONE DETECTED  LABBARB NONE DETECTED    Alcohol Level  Recent Labs  Lab 01/19/21 1158  ETH <10   IMAGING and DIAGNOSTICS  MRI 1. Possible subcentimeter acute right parietal cortical infarct along the  margin of a chronic parieto-occipital infarct. 2. Large chronic left PCA infarct. 3. Encephalomalacia in the anterior right temporal lobe. 4. Moderate chronic small vessel ischemic disease with multiple chronic lacunar infarcts.  CT head  1. No evidence of acute intracranial abnormality. 2. Similar encephalomalacia in the anterolateral right temporal lobe and bilateral occipital lobes. 3. Moderate chronic microvascular ischemic disease.  Carotid Doppler Study Right Carotid: Velocities in the right ICA are consistent with a 1-39% stenosis. Hemodynamically significant plaque 80-99% visualized in the CCA.  The ECA appears >50% stenosed.   Left Carotid: Velocities in the left ICA are consistent with a 60-79%  stenosis. Non-hemodynamically significant plaque <50% noted in the CCA. The ECA appears >50% stenosed.   Vertebrals: Left vertebral artery demonstrates antegrade flow. Right Vertebral artery shows severely dampened waveforms.  Subclavians: Normal flow hemodynamics were seen in the right subclavian  artery.  Left subclavian artery shows low resistive, post stenotic waveforms.   PHYSICAL EXAM General:  Pleasant frail elderly Caucasian male on nonrebreather.  Mild respiratory distress.   HENT: Normal oropharynx and mucosa. Normal external appearance of ears and nose. Neck: Supple, no pain or tenderness CV: No JVD. No peripheral edema. Pulmonary: Symmetric Chest rise.  Mild respiratory distress.  Is on facemask nonrebreather Abdomen: Soft to touch, non-tender. Ext: No cyanosis, edema, or deformity Skin: No rash. Normal palpation of skin.  Musculoskeletal: Normal digits and nails by inspection. No clubbing.  Neurologic Examination  Mental status/Cognition:  Is awake and alert.  Follows commands well.  Severe hypophonia and dysarthria following extubation but can be understood with some difficulty Speech/language:  No aphasia but hypophonic dysarthria Cranial nerves:   CN II  Pupils equal and reactive to light, blinks to threat BL   CN III,IV,VI EOM intact, no gaze preference or deviation, no nystagmus   CN V    CN VII Symmetric facial smile.   CN VIII Turns head towards speech.   CN IX & X    CN XI    CN XII    Motor:  Muscle bulk: poor, tone normal.  Able to raise all 4 extremities up against gravity without focal weakness. Unable to do detailes strength testinf but holds BL arms up off the bed and holds BL legs up off the bed.  ASSESSMENT/PLAN Adam Mcclain is a 70 year old male with a PMHx ofCVA, HTN, atrialfibrillation on aspirin and Plavix, COPD, and CKDpresenting with GTC seizures found to have a possible subcentimeter acute right parietal cortical infarct.  Possible subcentimeter acute right parietal cortical infarct along the margin of a chronic parieto-occipital infarct -likely clinically silent as the semiology of his seizures seems more related to his old left PCA infarct.  Etiology of both strokes likely atrial fibrillation   Code Stroke:  No evidence of acute intracranial abnormality.  MRI  Possible subcentimeter acute right parietal cortical infarct along the margin of a chronic parieto-occipital infarct. Large chronic left PCA infarct and multiple chronic lacunar infarcts.   TCD bubble study :Poor left temporal window limits evaluation of anetrior circulation on  left otherwise normal mean flow velocities in majority of identified  vessels of anterior and posterior cerebral circulation.   Carotid Doppler:  Hemodynamically significant plaque 80-99% visualized in the CCA on the right. Will need consideration for revascularization when more stable/appropriate. Old chart is now available pending merge showing history of right CEA 2017   2D Echo  Left ventricular ejection fraction, by estimation, is 60 to 65%. The left ventricle has normal function. The left ventricle has no regional wall motion abnormalities. Left ventricular diastolic  parameters were normal.   VTE prophylaxis - heparin 5000 units ppx     Diet   Diet NPO time specified    Currently on DAPT, final plan is pending more information  Therapy recommendations: TBD  Disposition:  TBD  A fib not on AC  A fib history but evidently not on anticoagulant but on DAPT prior to admission. Attempted to reach daughter and brother by phone to discuss or at least obtain PCP name/contact info. Neither answered their phones. Voicemails left. Dr. Derry Lory also tried yesterday. With pharmacist's help have identified Adam Mcclain of [facility] as a prescriber so will reach out to obtain history.   Old chart for merge has been identified with Dr. Donzetta Sprung as PCP. I have left a message with his nurse trying to  clarify anticoagulation history.   Sinus rhythm currently  Seizure  Unclear if history of seizures.   cEEG with Triphasics 2-3 Hz on ictal-interictal spectrum but no seizures.   Also notable for moderate to severe diffuse encephalopathy.   cEEG was disontinued after LTM for more than 48 hours with no seizures.  Continue Keppra 1000mg  BID IV  Hypertension  Home meds:  Norvasc 5, Lasix 20mg , Zestril 20mg , all daily  Permissive hypertension (OK if < 220/120) but gradually normalize in 5-7 days . Long-term BP goal normotensive  Acute hypoxic resp failure  Aspiration PNA  Management per CCM, appreciated  Extubated 5/12  +Moraxella catarrhalis on resp culture. CCM planning IV Unasyn x 7 days.   Hyperlipidemia  Home meds:  Lipitor 40mg    LDL 43, goal < 70  Continue statin at discharge  Dysphagia Feeding   NPO  On TF @55cc /hr  Diabetes type II Controlled  HgbA1c 5.3, goal < 7.0  CBGs Recent Labs    01/26/21 0423 01/26/21 0520 01/26/21 0816  GLUCAP 65* 121* 87      SSI  Other Stroke Risk Factors  Advanced Age >/= 54   Order placed to obtain weight and record  Large chronic left PCA infarct and multiple chronic lacunar  infarcts.  Other Active Problems  Hypokalemia/hypophosphatemia: monitor and replete, CCM appreciated   Hospital day # 7     Continue weaning off nonrebreather and oxygen as per CCM team.  Therapy consults.  Speech therapy for swallow eval.  Consulted interventional neuroradiology to discuss elective right carotid revascularization due to stent.  Continue Keppra for seizures and dual antiplatelet therapy for high-grade right carotid stenosis and patient is able to swallow..  Discussed with Dr. critical care medicine and Dr. 01/28/21. 01/28/21  This patient is critically ill and at significant risk of neurological worsening, death and care requires constant monitoring of vital signs, hemodynamics,respiratory and cardiac monitoring, extensive review of multiple databases, frequent neurological assessment, discussion with family, other specialists and medical decision making of high complexity.I have made any additions or clarifications directly to the above note.This critical care time does not reflect procedure time, or teaching time or supervisory time of PA/NP/Med Resident etc but could involve care discussion time.  I spent 30 minutes of neurocritical care time  in the care of  this patient.     01/28/21, MD Medical Director [facility] Pager: [phone] 01/26/2021 11:43 AM    To contact Stroke Continuity provider, please refer to Dorice Lamas. After hours, contact General Neurology

## 2021-01-26 NOTE — Progress Notes (Signed)
NAME:  Adam Mcclain, MRN:  [id number], DOB:  1950/10/22, LOS: 7 ADMISSION DATE:  01/19/2021, CONSULTATION DATE: 5/6 REFERRING MD: Dr. Rubin Payor, CHIEF COMPLAINT: Seizure  History of Present Illness:  70 year old male with past medical history as below, which is significant for CVA, HTN, afibrillation on aspirin and Plavix, COPD, and CKD.  In the midday hours of 5/6 he had accompanied by family member to a doctor's appointment.  He was waiting in the car when a bystander walked by and noticed he had a gaze deviation and was shaking.  EMS was called.  Upon EMS arrival the patient was unresponsive and was administered 10 mg of Versed.  He was bagged in route to the emergency department where he was promptly intubated on presentation.  The ED physician did note some rigidity in the upper extremities concerning for seizure activity.  CT of the head was nonacute.  The patient was evaluated by neurology who provided a loading dose of Keppra and started EEG monitoring.  PCCM was asked to admit the patient due to the requirement of mechanical ventilation.  Significant Hospital Events: Including procedures, antibiotic start and stop dates in addition to other pertinent events   . 5/6 admit to [facility] for seizures on the ventilator. . 5/7 EEG shows no seizure activity . 5/8 propofol was turned off, patient is spiked fever but white count trended down . 5/9 EEG was negative for seizures, MRI was done showed very small right parietal stroke . 5/10 trial of extubation, patient is struggled had to be reintubated . 5/11 MRI brain showed subcentimeter new stroke . 5/12 tolerated pressure support, extubated and had to be reintubated because of respiratory distress . 5/13 patient was a scheduled for tracheostomy but he self extubated early this morning at shift change  Interim History / Subjective:  Patient had to be reintubated late in the afternoon yesterday due to respiratory distress This morning at shift  change patient self extubated, so far doing well, will continue to watch for respiratory distress  Objective   Blood pressure 125/77, pulse 84, temperature 98.6 F (37 C), temperature source Axillary, resp. rate 17, weight 60.6 kg, SpO2 100 %.    Vent Mode: PRVC FiO2 (%):  [50 %-100 %] 50 % Set Rate:  [18 bmp] 18 bmp Vt Set:  [580 mL] 580 mL PEEP:  [5 cmH20] 5 cmH20 Plateau Pressure:  [16 cmH20-19 cmH20] 17 cmH20   Intake/Output Summary (Last 24 hours) at 01/26/2021 0920 Last data filed at 01/26/2021 0600 Gross per 24 hour  Intake 1393.57 ml  Output 1275 ml  Net 118.57 ml   Filed Weights   01/21/21 0500 01/23/21 0500 01/24/21 0500  Weight: 62.8 kg 63 kg 60.6 kg    Examination: General: Acute on chronically ill looking elderly male, lying on the bed on nonrebreather mask HENT: Atraumatic, normocephalic, moist mucous membranes, no JVD Lungs: Bilateral rhonchi all over, no wheezes or crackles Cardiovascular: RRR no mrg Abdomen: Soft, non distended, normoactive bowel sounds Extremities: No acute deformity Neuro: Awake, following intermittent commands, moving all 4 extremities spontaneously Skin: No rash  Labs/imaging that I have personally reviewed  (right click and "Reselect all SmartList Selections" daily)  Na 143 K 3.6 Cl 114 Cr 1.19 WBC 7.9 Hgb 11.2  Respiratory culture: Grew Moraxella catarrhalis 5/9: MRI brain: New subcentimeter acute infarct in right parietal region.  With multiple chronic infarcts  Resolved Hospital Problem list   AKI on CKD stage III A  Assessment & Plan:  New onset Seizure Acute right parietal stroke No more seizures, EEG was negative Continue Keppra MRI brain confirmed new stroke in right parietal region MRA head and neck is pending He may need TEE before discharge Continue aspirin, Plavix and atorvastatin Continue to hold wellbutrin  Acute hypoxemic respiratory failure in the setting of seizure and aspiration pneumonia with  Moraxella catarrhalis Patient was successfully extubated yesterday but later in the day again he started with respiratory distress, he became tachypneic and hypoxic had to be reintubated This morning at shift change he self extubated so far he looks okay Will continue to watch for respiratory distress, if he fails again, will proceed with tracheostomy later in the day.  Currently on nonrebreather mask Respiratory culture grew Moraxella Continue IV Unasyn for total of 7 days Titrate oxygen with O2 sat goal of 92 to 95%  Bilateral carotid stenosis Carotid vascular ultrasound showed severe right internal carotid artery stenosis 80-99%, moderate to severe stenosis on the left 60 to 79% Once patient gets stable, he will need IR guided ICA stent  Paroxysmal atrial fibrillation: Not on AC HTN Remained in sinus rhythm Continue amlodipine Continue to hold HCTZ and lisinopril  Hypokalemia/hypophosphatemia Continue aggressive electrolyte supplements  Hypertriglyceridemia Continue atorvastatin Avoid propofol and Cleviprex  COPD history documented. No associated home meds listed. No evidence of acute exacerbation.  PRN Duonebs  Best practice (right click and "Reselect all SmartList Selections" daily)  Diet:  NPO.  Speech and swallow evaluation Pain/Anxiety/Delirium protocol (if indicated): N/A VAP protocol (if indicated): N/A DVT prophylaxis: Subcutaneous Heparin GI prophylaxis: PPI Glucose control:  SSI No Central venous access:  N/A Arterial line:  N/A Foley:  N/A Mobility: Out of bed to chair PT consulted: Yes Last date of multidisciplinary goals of care discussion [5/12: Spoke with patient's brother Brett Canales) who is healthcare proxy, decision was to proceed with tracheostomy, currently patient self extubated, if he requires reintubation then will proceed with tracheostomy today  Code Status:  full code Disposition: ICU   Total critical care time: 34 minutes  Performed by: Cheri Fowler   Critical care time was exclusive of separately billable procedures and treating other patients.   Critical care was necessary to treat or prevent imminent or life-threatening deterioration.   Critical care was time spent personally by me on the following activities: development of treatment plan with patient and/or surrogate as well as nursing, discussions with consultants, evaluation of patient's response to treatment, examination of patient, obtaining history from patient or surrogate, ordering and performing treatments and interventions, ordering and review of laboratory studies, ordering and review of radiographic studies, pulse oximetry and re-evaluation of patient's condition.   Cheri Fowler MD [facility] See Amion for pager If no response to pager, please call [phone] until 7pm After 7pm, Please call E-link [phone]

## 2021-01-27 ENCOUNTER — Inpatient Hospital Stay (HOSPITAL_COMMUNITY): Payer: Medicare Other

## 2021-01-27 DIAGNOSIS — E43 Unspecified severe protein-calorie malnutrition: Secondary | ICD-10-CM | POA: Diagnosis not present

## 2021-01-27 DIAGNOSIS — I480 Paroxysmal atrial fibrillation: Secondary | ICD-10-CM

## 2021-01-27 DIAGNOSIS — R569 Unspecified convulsions: Secondary | ICD-10-CM | POA: Diagnosis not present

## 2021-01-27 DIAGNOSIS — Z8673 Personal history of transient ischemic attack (TIA), and cerebral infarction without residual deficits: Secondary | ICD-10-CM

## 2021-01-27 DIAGNOSIS — J156 Pneumonia due to other aerobic Gram-negative bacteria: Secondary | ICD-10-CM

## 2021-01-27 DIAGNOSIS — J988 Other specified respiratory disorders: Secondary | ICD-10-CM

## 2021-01-27 DIAGNOSIS — Z789 Other specified health status: Secondary | ICD-10-CM | POA: Diagnosis not present

## 2021-01-27 DIAGNOSIS — I6330 Cerebral infarction due to thrombosis of unspecified cerebral artery: Secondary | ICD-10-CM | POA: Diagnosis not present

## 2021-01-27 DIAGNOSIS — I6523 Occlusion and stenosis of bilateral carotid arteries: Secondary | ICD-10-CM

## 2021-01-27 LAB — POCT I-STAT 7, (LYTES, BLD GAS, ICA,H+H)
Acid-Base Excess: 0 mmol/L (ref 0.0–2.0)
Bicarbonate: 23.8 mmol/L (ref 20.0–28.0)
Calcium, Ion: 1.12 mmol/L — ABNORMAL LOW (ref 1.15–1.40)
HCT: 35 % — ABNORMAL LOW (ref 39.0–52.0)
Hemoglobin: 11.9 g/dL — ABNORMAL LOW (ref 13.0–17.0)
O2 Saturation: 99 %
Patient temperature: 36.9
Potassium: 3.9 mmol/L (ref 3.5–5.1)
Sodium: 143 mmol/L (ref 135–145)
TCO2: 25 mmol/L (ref 22–32)
pCO2 arterial: 34.7 mmHg (ref 32.0–48.0)
pH, Arterial: 7.444 (ref 7.350–7.450)
pO2, Arterial: 124 mmHg — ABNORMAL HIGH (ref 83.0–108.0)

## 2021-01-27 LAB — BASIC METABOLIC PANEL
Anion gap: 13 (ref 5–15)
BUN: 20 mg/dL (ref 8–23)
CO2: 21 mmol/L — ABNORMAL LOW (ref 22–32)
Calcium: 8.2 mg/dL — ABNORMAL LOW (ref 8.9–10.3)
Chloride: 107 mmol/L (ref 98–111)
Creatinine, Ser: 1.23 mg/dL (ref 0.61–1.24)
GFR, Estimated: >60 mL/min (ref >60)
Glucose, Bld: 174 mg/dL — ABNORMAL HIGH (ref 70–99)
Potassium: 4.8 mmol/L (ref 3.5–5.1)
Sodium: 141 mmol/L (ref 135–145)

## 2021-01-27 LAB — CBC
HCT: 38.9 % — ABNORMAL LOW (ref 39.0–52.0)
Hemoglobin: 12.9 g/dL — ABNORMAL LOW (ref 13.0–17.0)
MCH: 31.9 pg (ref 26.0–34.0)
MCHC: 33.2 g/dL (ref 30.0–36.0)
MCV: 96.3 fL (ref 80.0–100.0)
Platelets: 300 10*3/uL (ref 150–400)
RBC: 4.04 MIL/uL — ABNORMAL LOW (ref 4.22–5.81)
RDW: 13.4 % (ref 11.5–15.5)
WBC: 14.6 10*3/uL — ABNORMAL HIGH (ref 4.0–10.5)
nRBC: 0 % (ref 0.0–0.2)

## 2021-01-27 LAB — HEPATIC FUNCTION PANEL
ALT: 93 U/L — ABNORMAL HIGH (ref 0–44)
AST: 65 U/L — ABNORMAL HIGH (ref 15–41)
Albumin: 2.2 g/dL — ABNORMAL LOW (ref 3.5–5.0)
Alkaline Phosphatase: 93 U/L (ref 38–126)
Bilirubin, Direct: 0.4 mg/dL — ABNORMAL HIGH (ref 0.0–0.2)
Indirect Bilirubin: 1.5 mg/dL — ABNORMAL HIGH (ref 0.3–0.9)
Total Bilirubin: 1.9 mg/dL — ABNORMAL HIGH (ref 0.3–1.2)
Total Protein: 6.1 g/dL — ABNORMAL LOW (ref 6.5–8.1)

## 2021-01-27 LAB — GLUCOSE, CAPILLARY
Glucose-Capillary: 144 mg/dL — ABNORMAL HIGH (ref 70–99)
Glucose-Capillary: 160 mg/dL — ABNORMAL HIGH (ref 70–99)
Glucose-Capillary: 178 mg/dL — ABNORMAL HIGH (ref 70–99)
Glucose-Capillary: 193 mg/dL — ABNORMAL HIGH (ref 70–99)
Glucose-Capillary: 206 mg/dL — ABNORMAL HIGH (ref 70–99)
Glucose-Capillary: 276 mg/dL — ABNORMAL HIGH (ref 70–99)

## 2021-01-27 MED ORDER — NOREPINEPHRINE 4 MG/250ML-% IV SOLN
INTRAVENOUS | Status: AC
  Administered 2021-01-27: 10 ug/min via INTRAVENOUS
  Filled 2021-01-27: qty 250

## 2021-01-27 MED ORDER — MIDAZOLAM HCL 2 MG/2ML IJ SOLN
INTRAMUSCULAR | Status: AC
  Filled 2021-01-27: qty 4

## 2021-01-27 MED ORDER — POLYETHYLENE GLYCOL 3350 17 G PO PACK
17.0000 g | PACK | Freq: Every day | ORAL | Status: DC
  Administered 2021-01-28: 17 g
  Filled 2021-01-27: qty 1

## 2021-01-27 MED ORDER — LEVETIRACETAM 500 MG PO TABS: 1000.0000 mg | ORAL_TABLET | Freq: Two times a day (BID) | ORAL | Status: DC

## 2021-01-27 MED ORDER — FENTANYL CITRATE (PF) 100 MCG/2ML IJ SOLN
300.0000 ug | Freq: Once | INTRAMUSCULAR | Status: AC
Start: 2021-01-27 — End: 2021-01-27

## 2021-01-27 MED ORDER — FENTANYL CITRATE (PF) 100 MCG/2ML IJ SOLN: 200.0000 ug | Freq: Once | INTRAMUSCULAR | Status: DC

## 2021-01-27 MED ORDER — MIDAZOLAM BOLUS VIA INFUSION
0.0000 mg | INTRAVENOUS | Status: DC | PRN
Start: 2021-01-27 — End: 2021-01-31
  Filled 2021-01-27: qty 5

## 2021-01-27 MED ORDER — MIDAZOLAM HCL 2 MG/2ML IJ SOLN
INTRAMUSCULAR | Status: AC
  Administered 2021-01-27: 6 mg via INTRAVENOUS
  Filled 2021-01-27: qty 2

## 2021-01-27 MED ORDER — MIDAZOLAM HCL 2 MG/2ML IJ SOLN: 5.0000 mg | Freq: Once | INTRAMUSCULAR | Status: DC

## 2021-01-27 MED ORDER — DOCUSATE SODIUM 50 MG/5ML PO LIQD
100.0000 mg | Freq: Two times a day (BID) | ORAL | Status: DC
  Administered 2021-01-28: 100 mg

## 2021-01-27 MED ORDER — ETOMIDATE 2 MG/ML IV SOLN: 40.0000 mg | Freq: Once | INTRAVENOUS | Status: DC

## 2021-01-27 MED ORDER — MIDAZOLAM HCL 2 MG/2ML IJ SOLN: 6.0000 mg | Freq: Once | INTRAMUSCULAR | Status: AC

## 2021-01-27 MED ORDER — FENTANYL CITRATE (PF) 100 MCG/2ML IJ SOLN
INTRAMUSCULAR | Status: AC
  Administered 2021-01-27: 300 ug via INTRAVENOUS
  Filled 2021-01-27: qty 2

## 2021-01-27 MED ORDER — KETAMINE HCL 50 MG/5ML IJ SOSY
PREFILLED_SYRINGE | INTRAMUSCULAR | Status: AC
  Filled 2021-01-27: qty 5

## 2021-01-27 MED ORDER — ETOMIDATE 2 MG/ML IV SOLN
INTRAVENOUS | Status: AC
  Filled 2021-01-27: qty 20

## 2021-01-27 MED ORDER — ASPIRIN 81 MG PO CHEW
324.0000 mg | CHEWABLE_TABLET | Freq: Every day | ORAL | Status: DC
  Administered 2021-01-28 – 2021-01-30 (×3): 324 mg
  Filled 2021-01-27 (×3): qty 4

## 2021-01-27 MED ORDER — DEXMEDETOMIDINE HCL IN NACL 400 MCG/100ML IV SOLN
0.0000 ug/kg/h | INTRAVENOUS | Status: AC
  Administered 2021-01-27: 0.4 ug/kg/h via INTRAVENOUS
  Administered 2021-01-27: 0.6 ug/kg/h via INTRAVENOUS
  Administered 2021-01-29 (×2): 0.2 ug/kg/h via INTRAVENOUS
  Administered 2021-01-30: 0.6 ug/kg/h via INTRAVENOUS
  Filled 2021-01-27 (×4): qty 100

## 2021-01-27 MED ORDER — NOREPINEPHRINE 4 MG/250ML-% IV SOLN
0.0000 ug/min | INTRAVENOUS | Status: DC
  Administered 2021-01-27: 5 ug/min via INTRAVENOUS
  Administered 2021-01-27: 10 ug/min via INTRAVENOUS
  Filled 2021-01-27 (×2): qty 250

## 2021-01-27 MED ORDER — ROCURONIUM BROMIDE 10 MG/ML (PF) SYRINGE
PREFILLED_SYRINGE | INTRAVENOUS | Status: AC
  Filled 2021-01-27: qty 10

## 2021-01-27 MED ORDER — MIDAZOLAM 50MG/50ML (1MG/ML) PREMIX INFUSION
0.0000 mg/h | INTRAVENOUS | Status: DC
  Administered 2021-01-27 – 2021-01-28 (×2): 2 mg/h via INTRAVENOUS
  Administered 2021-01-29: 3 mg/h via INTRAVENOUS
  Filled 2021-01-27 (×3): qty 50

## 2021-01-27 MED ORDER — FENTANYL CITRATE (PF) 100 MCG/2ML IJ SOLN
INTRAMUSCULAR | Status: AC
  Filled 2021-01-27: qty 2

## 2021-01-27 MED ORDER — LEVETIRACETAM IN NACL 1000 MG/100ML IV SOLN
1000.0000 mg | Freq: Two times a day (BID) | INTRAVENOUS | Status: DC
  Administered 2021-01-27 – 2021-01-29 (×5): 1000 mg via INTRAVENOUS
  Filled 2021-01-27 (×6): qty 100

## 2021-01-27 MED ORDER — VECURONIUM BROMIDE 10 MG IV SOLR: 10.0000 mg | Freq: Once | INTRAVENOUS | Status: DC

## 2021-01-27 MED ORDER — PROPOFOL 10 MG/ML IV BOLUS
100.0000 mg | Freq: Once | INTRAVENOUS | Status: AC
  Administered 2021-01-27: 10 mg via INTRAVENOUS

## 2021-01-27 NOTE — Procedures (Signed)
[name] is a 70 year old gentleman who was admitted on 01/19/21 with seizure activity resulting in respiratory failure. He self extubated 01/26/21 AM but developed increased work of breathing, grunting and desaturation.  Hence he required re-intubation. It is reported that patient is being considered for tracheostomy.  Informed consent was acquired from patient's brother Adam Mcclain. Indication: Acute respiratory failure secondary to airway compromise. Time out was established.  Drug for induction: Fentanyl, Propofol, Versed  Patient was easily intubated with size 8 ET tube under direct visualization of the glidescope. There were copious secretions in the oropharynx. The cords were visualized and the ET tube was passed without trauma. The ET tube was secured at 24 cm at the lips.  Post intubation CXR confirmed good position.  Patient tolerated the procedure well.  Levophed was started prior to the intubation to avoid hypotension.

## 2021-01-27 NOTE — Progress Notes (Signed)
NAME:  ALEKSANDAR DUVE, MRN:  [id number], DOB:  November 11, 1950, LOS: 8 ADMISSION DATE:  01/19/2021, CONSULTATION DATE: 5/6 REFERRING MD: Dr. Rubin Payor, CHIEF COMPLAINT: Seizure  History of Present Illness:  70 year old male with past medical history as below, which is significant for CVA, HTN, afibrillation on aspirin and Plavix, COPD, and CKD.  In the midday hours of 5/6 he had accompanied by family member to a doctor's appointment.  He was waiting in the car when a bystander walked by and noticed he had a gaze deviation and was shaking.  EMS was called.  Upon EMS arrival the patient was unresponsive and was administered 10 mg of Versed.  He was bagged in route to the emergency department where he was promptly intubated on presentation.  The ED physician did note some rigidity in the upper extremities concerning for seizure activity.  CT of the head was nonacute.  The patient was evaluated by neurology who provided a loading dose of Keppra and started EEG monitoring.  PCCM was asked to admit the patient due to the requirement of mechanical ventilation.  Significant Hospital Events: Including procedures, antibiotic start and stop dates in addition to other pertinent events   . 5/6 admit to [facility] for seizures on the ventilator. . 5/7 EEG shows no seizure activity . 5/8 propofol was turned off, patient is spiked fever but white count trended down . 5/9 EEG was negative for seizures, MRI was done showed very small right parietal stroke . 5/10 trial of extubation, patient is struggled had to be reintubated . 5/11 MRI brain showed subcentimeter new stroke . 5/12 tolerated pressure support, extubated and had to be reintubated because of respiratory distress . 5/13 patient was a scheduled for tracheostomy but he self extubated early this morning at shift change . 5/14 reintubated overnight for uncontrolled WOB and inability to manage secretions  Interim History / Subjective:  Reintubated overnight. Back  on propofol and norepi.  Objective   Blood pressure 98/80, pulse 85, temperature 98.1 F (36.7 C), temperature source Axillary, resp. rate (!) 22, weight 58.9 kg, SpO2 97 %.    Vent Mode: PRVC FiO2 (%):  [30 %-40 %] 30 % Set Rate:  [18 bmp] 18 bmp Vt Set:  [580 mL] 580 mL PEEP:  [5 cmH20] 5 cmH20 Plateau Pressure:  [17 cmH20] 17 cmH20   Intake/Output Summary (Last 24 hours) at 01/27/2021 1023 Last data filed at 01/27/2021 0640 Gross per 24 hour  Intake 2894.24 ml  Output 1200 ml  Net 1694.24 ml   Filed Weights   01/23/21 0500 01/24/21 0500 01/27/21 0400  Weight: 63 kg 60.6 kg 58.9 kg    Examination: General: critically ill appearing man lying in bed in NAD, intubated and sedated HENT: Martin/AT, eyes anicteric Lungs: minimal rhonchi, clear secretions from ETTm breathing above the vent Cardiovascular: S1S2, RRR Abdomen: soft, NT, ND Extremities: no cyanosis or clubbing Neuro: RASS -3, moving nonpurposefully, opening eyes some but not following commands Skin: no rashes or ecchymoses, warm & dry  Labs/imaging that I have personally reviewed  (right click and "Reselect all SmartList Selections" daily)  Na 141 K 4.8 Cl 107 Cr 1.23 WBC 14.6 Hgb 12.9  7.44/ 34.7/124/23.8 5/9 Respiratory culture: Grew Moraxella catarrhalis, B- lactamase + 5/9: MRI brain: New subcentimeter acute infarct in right parietal region.  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 Failed extubated x 3  now. -discussed with brother, planning for tracheostomy this afternoon. -LTVV, 4-8cc/kg IBW with goal Pplat<30 and DP<15 -VAP prevention protocol -PAD protocol for sedation -Continue IV Unasyn for 7 days -titrate FiO2 to maintain SpO2 >90%  Bilateral carotid stenosis Carotid vascular ultrasound showed severe right internal carotid artery stenosis 80-99%, moderate to severe stenosis on the left 60 to 79% -Once patient gets stable, he will need IR guided ICA stent -aspirin, DAPT  Paroxysmal atrial fibrillation: Not on AC HTN Remained in sinus rhythm -Continue amlodipine -Continue to hold HCTZ and lisinopril  Hypokalemia/hypophosphatemia- resolved -Con't to monitor  Hypertriglyceridemia -Continue atorvastatin -Avoid Cleviprex & propofol >> stopping propofol today  COPD history documented. No associated home meds listed. No evidence of acute exacerbation.  -PRN Duonebs; f needing frequently can add long-acting  Hyperbilirubinemia, elevated LFTs -RUQ [place] -monitor  Best practice (right click and "Reselect all SmartList Selections" daily)  Diet:  Tube Feed .  Pain/Anxiety/Delirium protocol (if indicated): yes VAP protocol (if indicated): yes DVT prophylaxis: Subcutaneous Heparin GI prophylaxis: PPI Glucose control:  SSI Yes Central venous access:  N/A Arterial line:  N/A Foley:  N/A Mobility: Out of bed to chair PT consulted: Yes Last date of multidisciplinary goals of care discussion [5/14: trach planned for today; d/w brother [name] at bedside with RN present  Code Status:  full code Disposition: ICU  This patient is critically ill with multiple organ system failure which requires frequent high complexity decision making, assessment, support, evaluation, and titration of therapies. This was completed through the application of advanced monitoring technologies and extensive interpretation of multiple databases. During this encounter critical care time was devoted to patient  care services described in this note for 40 minutes.  Steffanie Dunn, DO 01/27/21 10:46 AM [facility]

## 2021-01-27 NOTE — Progress Notes (Signed)
Inpatient Rehab Admissions Coordinator Note:   Per PT recommendations, pt was screened for CIR candidacy by Wolfgang Phoenix, MS, CCC-SLP.  At this time we are not recommending an inpatient rehab consult. Note pt is on the ventilator.  Will follow from a distance.    Wolfgang Phoenix, MS, CCC-SLP Admissions Coordinator [phone] 01/27/21 8:55 PM

## 2021-01-27 NOTE — Evaluation (Signed)
Physical Therapy Evaluation Patient Details Name: Adam Mcclain MRN: [id number] DOB: [birth year] Today's Date: 01/27/2021   History of Present Illness  70 y.o. male admitted on 01/19/21 for seizure.  Found to have several punctate acute right parietal cortical infarcts, etiology unclear (possibly a fib no on anticoagulant vs R CCA high grade stenosis).  He was intubated in the ED, extubated on 5/10, but had to be re-intubated due to vomiting and aspirating, extubated again on 5/12, but had to be re-intubated later that day.  Pt then self-extubated on 5/13 and had to be reintubated on 5/14.  Pt with significant PMH of stroke, PAD, HTN, nephrolithiasis, A-fib.  Clinical Impression  Limited bed level assessment as pt was still intubated and quite sedate.  He was arousable to his name and sternal rub, but only momentarily.  I could not get him to follow any commands.  I repositioned him higher in the bed and positioned him in high chair mode. VSS throughout with changes in position and LE ROM.  Vent settings: PRVC, FiO2 30 % PEEP 5.    Follow Up Recommendations CIR    Equipment Recommendations  Wheelchair (measurements PT);Wheelchair cushion (measurements PT);Hospital bed    Recommendations for Other Services       Precautions / Restrictions Precautions Precautions: Fall Precaution Comments: left inattention and L sided weakness?      Mobility  Bed Mobility Overal bed mobility: Needs Assistance             General bed mobility comments: Total assist to scoot up in bed with RT assist.    Transfers                    Ambulation/Gait                Stairs            Wheelchair Mobility    Modified Rankin (Stroke Patients Only)       Balance                                             Pertinent Vitals/Pain Pain Assessment: Faces Pain Score: 0-No pain    Home Living                   Additional Comments: pt unable to  report, no family present    Prior Function                 Hand Dominance        Extremity/Trunk Assessment   Upper Extremity Assessment Upper Extremity Assessment: Difficult to assess due to impaired cognition (difficult to assess due to necessary bil wrist restraints)    Lower Extremity Assessment Lower Extremity Assessment: RLE deficits/detail;LLE deficits/detail RLE Deficits / Details: Pt with intact ROM and no increased tone.  Unable to follow any commands as he was fighting sedation on the vent LLE Deficits / Details: Pt with intact ROM and no increased tone.  Unable to follow any commands as he was fighting sedation on the vent    Cervical / Trunk Assessment Cervical / Trunk Assessment: Other exceptions Cervical / Trunk Exceptions: head rotated to the left, possible inattention or neglect of L side  Communication   Communication: Other (comment) (ETT-intubated)  Cognition Arousal/Alertness: Lethargic;Suspect due to medications   Overall Cognitive Status: Difficult to assess Area of Impairment:  Attention                   Current Attention Level: Focused (only arousable for a few seconds)                  General Comments General comments (skin integrity, edema, etc.): VSS throughout on PRVC vent FiO2 30%, PEEP 5, BP 127/98 and remained in that area with multiple re-checks.  When I could get pt to arouse and he did open his eyes I saw downward beating nystagmus.    Exercises General Exercises - Lower Extremity Ankle Circles/Pumps: PROM;Both;10 reps Heel Slides: PROM;Both;10 reps Hip ABduction/ADduction: PROM;Both;10 reps   Assessment/Plan    PT Assessment Patient needs continued PT services  PT Problem List Decreased strength;Decreased activity tolerance;Decreased balance;Decreased mobility;Decreased coordination;Decreased cognition;Decreased knowledge of use of DME;Decreased safety awareness;Cardiopulmonary status limiting activity        PT Treatment Interventions DME instruction;Gait training;Stair training;Functional mobility training;Therapeutic activities;Therapeutic exercise;Balance training;Neuromuscular re-education;Cognitive remediation;Patient/family education;Wheelchair mobility training;Manual techniques    PT Goals (Current goals can be found in the Care Plan section)  Acute Rehab PT Goals PT Goal Formulation: Patient unable to participate in goal setting Time For Goal Achievement: 02/10/21 Potential to Achieve Goals: Good    Frequency Min 3X/week   Barriers to discharge        Co-evaluation               AM-PAC PT "6 Clicks" Mobility  Outcome Measure Help needed turning from your back to your side while in a flat bed without using bedrails?: Total Help needed moving from lying on your back to sitting on the side of a flat bed without using bedrails?: Total Help needed moving to and from a bed to a chair (including a wheelchair)?: Total Help needed standing up from a chair using your arms (e.g., wheelchair or bedside chair)?: Total Help needed to walk in hospital room?: Total Help needed climbing 3-5 steps with a railing? : Total 6 Click Score: 6    End of Session Equipment Utilized During Treatment: Oxygen Activity Tolerance: Patient limited by lethargy Patient left: in bed;with call bell/phone within reach;Other (comment) (in high chair mode) Nurse Communication: Other (comment);Mobility status (left in high chair mode) PT Visit Diagnosis: Muscle weakness (generalized) (M62.81);Difficulty in walking, not elsewhere classified (R26.2);Other symptoms and signs involving the nervous system (R29.898);Hemiplegia and hemiparesis Hemiplegia - Right/Left: Left Hemiplegia - dominant/non-dominant: Non-dominant Hemiplegia - caused by: Cerebral infarction    Time: 8786-7672 PT Time Calculation (min) (ACUTE ONLY): 18 min   Charges:   PT Evaluation $PT Eval Moderate Complexity: 1 Mod         Corinna Capra, PT, DPT  Acute Rehabilitation [phone] pager [phone]) [phone] office

## 2021-01-27 NOTE — Progress Notes (Signed)
eLink Physician-Brief Progress Note Patient Name: Adam Mcclain DOB: [birth year] MRN: [id number]   Date of Service  01/27/2021  HPI/Events of Note  Patient self-extubated yesterday prior to planned tracheostomy. Left on FM O2, His has a history of being re-intubated X 3. Now with increased WOB. Not able to effectively cough and mobilize secretions. Sat = 100% on NRB mask. RR = 30.  eICU Interventions  The patient likely needs reintubation. Will ask PCCM ground team to evaluate him at bedside.      Intervention Category Major Interventions: Other:  Lenell Antu 01/27/2021, 3:43 AM

## 2021-01-27 NOTE — Progress Notes (Addendum)
STROKE TEAM PROGRESS NOTE   SUBJECTIVE (INTERVAL HISTORY) His RN and RT are at the bedside.  Pt self extubated overnight but re-intubated due to breathing difficulty. Scheduled trach today but postponed due to on plavix. Now plavix on hold, will increase ASA dose. 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    OBJECTIVE Temp:  [97.9 F (36.6 C)-100.2 F (37.9 C)] 98.1 F (36.7 C) (05/14 0800) Pulse Rate:  [70-95] 72 (05/14 1100) Cardiac Rhythm: Atrial fibrillation (05/13 1915) Resp:  [17-35] 17 (05/14 1100) BP: (98-147)/(73-97) 110/84 (05/14 1100) SpO2:  [88 %-100 %] 97 % (05/14 1100) FiO2 (%):  [30 %-40 %] 30 % (05/14 0802) Weight:  [58.9 kg] 58.9 kg (05/14 0400)  Recent Labs  Lab 01/26/21 1556 01/26/21 1938 01/26/21 2333 01/27/21 0342 01/27/21 0839  GLUCAP 125* 156* 193* 193* 276*   Recent Labs  Lab 01/20/21 1629 01/20/21 2025 01/21/21 0549 01/21/21 1631 01/22/21 0400 01/23/21 0433 01/25/21 0414 01/25/21 1621 01/27/21 0607 01/27/21 0617  NA  --    < > 140  --  140 141 143 143 141 143  K  --    < > 3.1*  --  3.8 3.9 3.1* 3.6 4.8 3.9  CL  --   --  112*  --  113* 109 114*  --  107  --   CO2  --   --  21*  --  21* 24 24  --  21*  --   GLUCOSE  --   --  123*  --  175* 157* 126*  --  174*  --   BUN  --   --  15  --  18 22 31*  --  20  --   CREATININE  --   --  1.51*  --  1.28* 1.28* 1.19  --  1.23  --   CALCIUM  --    < > 7.5*  --  8.3* 8.5* 8.2*  --  8.2*  --   MG 2.1  --  2.2 2.3 2.0  --  1.9  --   --   --   PHOS 2.6   < > 2.8 1.9* 2.3* 2.6 2.3*  --   --   --    < > = values in this interval not displayed.   Recent Labs  Lab 01/25/21 0414 01/27/21 0607  AST 56* 65*  ALT 54* 93*  ALKPHOS 84 93  BILITOT 0.3 1.9*  PROT 5.4* 6.1*  ALBUMIN 1.9* 2.2*   Recent Labs  Lab 01/21/21 0549 01/22/21 0400 01/25/21 0414 01/25/21 1621 01/27/21 0607 01/27/21 0617  WBC 12.2* 7.9 7.9  --  14.6*  --    NEUTROABS 10.5*  --   --   --   --   --   HGB 11.3* 12.1* 11.2* 10.9* 12.9* 11.9*  HCT 34.0* 35.9* 34.5* 32.0* 38.9* 35.0*  MCV 97.4 95.5 96.6  --  96.3  --   PLT 125* 110* 130*  --  300  --    No results for input(s): CKTOTAL, CKMB, CKMBINDEX, TROPONINI in the last 168 hours. No results for input(s): LABPROT, INR in the last 72 hours. No results for input(s): COLORURINE, LABSPEC, PHURINE, GLUCOSEU, HGBUR, BILIRUBINUR, KETONESUR, PROTEINUR, UROBILINOGEN, NITRITE, LEUKOCYTESUR in the last 72 hours.  Invalid input(s): APPERANCEUR     Component Value Date/Time   CHOL 118 01/23/2021 1215   TRIG 309 (H) 01/26/2021 1610  HDL 15 (L) 01/23/2021 1215   CHOLHDL 7.9 01/23/2021 1215   VLDL 60 (H) 01/23/2021 1215   LDLCALC 43 01/23/2021 1215   Lab Results  Component Value Date   HGBA1C 5.3 01/23/2021      Component Value Date/Time   LABOPIA NONE DETECTED 01/19/2021 1237   COCAINSCRNUR NONE DETECTED 01/19/2021 1237   LABBENZ POSITIVE (A) 01/19/2021 1237   AMPHETMU NONE DETECTED 01/19/2021 1237   THCU NONE DETECTED 01/19/2021 1237   LABBARB NONE DETECTED 01/19/2021 1237    No results for input(s): ETH in the last 168 hours.  I have personally reviewed the radiological images below and agree with the radiology interpretations.  DG Chest 1 View  Result Date: 01/25/2021 CLINICAL DATA:  Intubation EXAM: CHEST  1 VIEW COMPARISON:  Portable exam 1553 hours compared to 01/23/2021 FINDINGS: Tip of endotracheal tube projects 2.9 cm above carina. Normal heart size, mediastinal contours, and pulmonary vascularity. Minimal atelectasis at lung bases. No definite infiltrate, pleural effusion or pneumothorax. IMPRESSION: Minimal bibasilar atelectasis. Electronically Signed   By: Ulyses SouthwardMark  Boles M.D.   On: 01/25/2021 17:12   DG Abd 1 View  Result Date: 01/25/2021 CLINICAL DATA:  Check gastric catheter placement EXAM: ABDOMEN - 1 VIEW COMPARISON:  None. FINDINGS: Gastric catheter is noted with the tip in  the stomach. Proximal side port lies in the distal esophagus however. This should be advanced several cm deeper into the stomach. IMPRESSION: Gastric catheter as described which should be advanced further into the stomach. Electronically Signed   By: Alcide CleverMark  Lukens M.D.   On: 01/25/2021 18:50   CT Head Wo Contrast  Result Date: 01/19/2021 CLINICAL DATA:  Found down.  Possible seizure. EXAM: CT HEAD WITHOUT CONTRAST TECHNIQUE: Contiguous axial images were obtained from the base of the skull through the vertex without intravenous contrast. COMPARISON:  CT head December 30, 2020. FINDINGS: Brain: Similar encephalomalacia in the anterolateral right temporal lobe, and bilateral occipital lobes. Similar small remote lacunar infarct versus dilated perivascular space in the left basal ganglia. No evidence of acute large vascular territory infarct. Moderate patchy white matter hypodensities, similar and likely related to chronic microvascular ischemic disease. No acute hemorrhage. Similar ex vacuo ventricular dilation without hydrocephalus. No visible extra-axial fluid collection. No mass lesion or abnormal mass effect. Vascular: No hyperdense vessel.  Calcific atherosclerosis. Skull: No acute fracture. Sinuses/Orbits: Sinuses are largely clear. No acute orbital abnormality. Other: No mastoid effusions. Debris in bilateral external auditory canals, presumably cerumen. Fluid layering in the pharynx, likely related to intubation. IMPRESSION: 1. No evidence of acute intracranial abnormality. 2. Similar encephalomalacia in the anterolateral right temporal lobe and bilateral occipital lobes. 3. Moderate chronic microvascular ischemic disease. Electronically Signed   By: Feliberto HartsFrederick S Jones MD   On: 01/19/2021 12:53   MR BRAIN WO CONTRAST  Result Date: 01/22/2021 CLINICAL DATA:  Seizure. EXAM: MRI HEAD WITHOUT CONTRAST TECHNIQUE: Multiplanar, multiecho pulse sequences of the brain and surrounding structures were obtained without  intravenous contrast. COMPARISON:  Head CT 01/19/2021 and MRI 03/04/2019 FINDINGS: Brain: There is a moderate-sized chronic right parieto-occipital infarct with associated chronic blood products. Scattered subcentimeter foci of trace diffusion weighted signal hyperintensity in this region are largely felt to be secondary to susceptibility artifact from chronic blood products, however there may be a superimposed subcentimeter acute cortical infarct in the right parietal lobe along the anterosuperior aspect of the chronic encephalomalacia (series 3, image 36). No acute infarct is identified elsewhere. There is a large chronic left PCA infarct  with chronic blood products. There is also encephalomalacia anteriorly in the right temporal lobe which may reflect an old infarct or be secondary to remote trauma. Patchy T2 hyperintensities in the cerebral white matter bilaterally are nonspecific but compatible with moderate chronic small vessel ischemic disease. Chronic lacunar infarcts are noted in the deep white matter of the right cerebral hemisphere and in the left basal ganglia. There is mild to moderate cerebral atrophy. Scattered chronic microhemorrhages are noted in the cerebrum and cerebellum including in the deep gray nuclei and may reflect the sequelae of chronic hypertension. No mass, midline shift, or extra-axial fluid collection is evident. Vascular: Major intracranial vascular flow voids are preserved. Skull and upper cervical spine: Unremarkable bone marrow signal. Congenital C2-3 fusion. Sinuses/Orbits: Bilateral cataract extraction. Clear paranasal sinuses. Small to moderate bilateral mastoid effusions. Other: Fluid in the pharynx in the setting of intubation. IMPRESSION: 1. Possible subcentimeter acute right parietal cortical infarct along the margin of a chronic parieto-occipital infarct. 2. Large chronic left PCA infarct. 3. Encephalomalacia in the anterior right temporal lobe. 4. Moderate chronic small  vessel ischemic disease with multiple chronic lacunar infarcts. Electronically Signed   By: Sebastian Ache M.D.   On: 01/22/2021 16:37   DG CHEST PORT 1 VIEW  Result Date: 01/27/2021 CLINICAL DATA:  Encounter for intubation of airway. EXAM: PORTABLE CHEST 1 VIEW COMPARISON:  01/25/2021 FINDINGS: ET tube tip is above the carina. There is a feeding tube with tip well below the GE junction in the projection of the right abdomen. Mild bibasilar opacities. No pleural effusions or interstitial edema. IMPRESSION: 1. ET tube tip above the carina. 2. Mild bibasilar opacities compatible with atelectasis versus pneumonia. Electronically Signed   By: Signa Kell M.D.   On: 01/27/2021 05:57   DG CHEST PORT 1 VIEW  Result Date: 01/23/2021 CLINICAL DATA:  Hypoxia EXAM: PORTABLE CHEST 1 VIEW COMPARISON:  Jan 21, 2021 FINDINGS: Endotracheal tube tip is at the carina directed toward the right main bronchus. Nasogastric tube tip and side port in stomach. No edema or airspace opacity. Apparent nipple shadow on the left, stable. Heart size and pulmonary vascularity are normal. No adenopathy. No bone lesions. IMPRESSION: Endotracheal tube tip at carina directed toward the right main bronchus. Advise withdrawing endotracheal tube approximately 3 cm. No edema or airspace opacity. These results will be called to the ordering clinician or representative by the Radiologist Assistant, and communication documented in the PACS or [employer]. Electronically Signed   By: Bretta Bang III M.D.   On: 01/23/2021 09:53   DG CHEST PORT 1 VIEW  Result Date: 01/21/2021 CLINICAL DATA:  Unresponsive, respirator dependent EXAM: PORTABLE CHEST 1 VIEW COMPARISON:  01/19/2021 chest radiograph. FINDINGS: Endotracheal tube tip is 3.0 cm above the carina. Enteric tube enters stomach with the tip not seen on this image. Stable cardiomediastinal silhouette with normal heart size. No pneumothorax. No pleural effusion. Mild medial bibasilar  atelectasis, new. No pulmonary edema. IMPRESSION: 1. Well-positioned endotracheal and enteric tubes. 2. New mild medial bibasilar atelectasis. Electronically Signed   By: Delbert Phenix M.D.   On: 01/21/2021 08:39   DG Chest Portable 1 View  Result Date: 01/19/2021 CLINICAL DATA:  Unresponsive. EXAM: PORTABLE CHEST 1 VIEW COMPARISON:  Chest x-ray dated December 30, 2020. FINDINGS: Endotracheal tube in place with the tip 4.7 cm above the carina. Enteric tube in the stomach. The heart size and mediastinal contours are within normal limits. Normal pulmonary vascularity. No focal consolidation, pleural effusion, or pneumothorax.  No acute osseous abnormality. Prior cholecystectomy. IMPRESSION: 1. Appropriately positioned endotracheal and enteric tubes. 2. No active disease. Electronically Signed   By: Obie Dredge M.D.   On: 01/19/2021 12:30   DG Abd Portable 1V  Result Date: 01/20/2021 CLINICAL DATA:  Enteric tube placement. EXAM: PORTABLE ABDOMEN - 1 VIEW COMPARISON:  Chest x-ray 01/19/2021 FINDINGS: Evidence of patient's enteric tube in adequate position with tip just left of midline over the stomach in the upper abdomen. Surgical clips over the gallbladder fossa. Nonobstructive bowel gas pattern. Remainder the exam is unchanged. IMPRESSION: Enteric tube with tip just left of midline over the stomach in the upper abdomen. Electronically Signed   By: Elberta Fortis M.D.   On: 01/20/2021 10:49   EEG adult  Result Date: 01/19/2021 Charlsie Quest, MD     01/19/2021  1:30 PM Patient Name: Adam Mcclain MRN: [id number] Epilepsy Attending: Charlsie Quest Referring Physician/Provider: Dr Caryl Pina Date: 01/19/2021 Duration: 27.02 mins Patient history: 70 year old male presenting with GTC seizures. EEG to evaluate for seizure Level of alertness:  lethargic AEDs during EEG study: LEV, Versed Technical aspects: This EEG study was done with scalp electrodes positioned according to the 10-20 International system of  electrode placement. Electrical activity was acquired at a sampling rate of 500Hz  and reviewed with a high frequency filter of 70Hz  and a low frequency filter of 1Hz . EEG data were recorded continuously and digitally stored. Description: EEG showed continuous generalized polymorphic sharply contoured 3 to 6 Hz theta-delta slowing. There is an excessive amount of 15 to 18 Hz beta activity with irregular morphology distributed symmetrically and diffusely. Hyperventilation and photic stimulation were not performed.   ABNORMALITY - Continuous slow, generalized - Excessive beta, generalized IMPRESSION: This study is suggestive of moderate diffuse encephalopathy, nonspecific etiology. The excessive beta activity seen in the background is most likely due to the effect of benzodiazepine and is a benign EEG pattern. No seizures or epileptiform discharges were seen throughout the recording. Priyanka Annabelle Harman   Overnight EEG with video  Result Date: 01/21/2021 Charlsie Quest, MD     01/21/2021  9:34 AM Patient Name: Adam Mcclain MRN: [id number] Epilepsy Attending: Charlsie Quest Referring Physician/Provider: Dr Caryl Pina Duration: 01/20/2021 0809 to 01/21/2021 0809  Patient history: 70 year old male presenting with GTC seizures. EEG to evaluate for seizure  Level of alertness:  comatose/sedated  AEDs during EEG study: LEV, Versed, propofol  Technical aspects: This EEG study was done with scalp electrodes positioned according to the 10-20 International system of electrode placement. Electrical activity was acquired at a sampling rate of 500Hz  and reviewed with a high frequency filter of 70Hz  and a low frequency filter of 1Hz . EEG data were recorded continuously and digitally stored.  Description: EEG initially showed burst suppression pattern with bursts of low amplitude 15-18hz  beta activity admixed with 5-7hz  theta slowing lasting 1-2 seconds alternating with generalized suppression lasting 2-3 seconds.Gradually, as  propofol was weaned off around 1730 on 01/20/2021, eeg showed continuous generalized polymorphic sharply contoured 3 to 6 Hz theta-delta slowing at times with triphasic morphology. Hyperventilation and photic stimulation were not performed.    ABNORMALITY - Burst suppression, generalized - Continuous slow, generalized  IMPRESSION: This study was initially suggestive of profound diffuse encephalopathy likely due to sedation. As propofol was weaned, eeg was suggestive of moderate to severe diffuse encephalopathy, nonspecific etiology. No seizures or epileptiform discharges were seen throughout the recording.  Priyanka Annabelle Harman  ECHOCARDIOGRAM COMPLETE  Result Date: 01/24/2021    ECHOCARDIOGRAM REPORT   Patient Name:   Adam Mcclain Date of Exam: 01/24/2021 Medical Rec #:  [id number]     Height: Accession #:    [id number]    Weight:       133.6 lb Date of Birth:  1951-04-19     BSA:          1.543 m Patient Age:    69 years      BP:           101/69 mmHg Patient Gender: M             HR:           60 bpm. Exam Location:  Inpatient Procedure: 2D Echo Indications:    stroke  History:        Patient has no prior history of Echocardiogram examinations.                 COPD and chronic kidney disease, Arrythmias:Atrial Fibrillation;                 Risk Factors:Hypertension.  Sonographer:    Delcie Roch Referring Phys: [id number] [facility]  Sonographer Comments: Echo performed with patient supine and on artificial respirator and suboptimal parasternal window. Image acquisition challenging due to uncooperative patient. IMPRESSIONS  1. Left ventricular ejection fraction, by estimation, is 60 to 65%. The left ventricle has normal function. The left ventricle has no regional wall motion abnormalities. Left ventricular diastolic parameters were normal.  2. Right ventricular systolic function is normal. The right ventricular size is normal.  3. The mitral valve is normal in structure. No evidence of mitral valve  regurgitation. No evidence of mitral stenosis.  4. The aortic valve has an indeterminant number of cusps. Aortic valve regurgitation is not visualized. No aortic stenosis is present.  5. The inferior vena cava is normal in size with greater than 50% respiratory variability, suggesting right atrial pressure of 3 mmHg. FINDINGS  Left Ventricle: Left ventricular ejection fraction, by estimation, is 60 to 65%. The left ventricle has normal function. The left ventricle has no regional wall motion abnormalities. The left ventricular internal cavity size was normal in size. There is  no left ventricular hypertrophy. Left ventricular diastolic parameters were normal. Right Ventricle: The right ventricular size is normal.Right ventricular systolic function is normal. Left Atrium: Left atrial size was normal in size. Right Atrium: Right atrial size was normal in size. Pericardium: There is no evidence of pericardial effusion. Mitral Valve: The mitral valve is normal in structure. No evidence of mitral valve regurgitation. No evidence of mitral valve stenosis. Tricuspid Valve: The tricuspid valve is normal in structure. Tricuspid valve regurgitation is trivial. No evidence of tricuspid stenosis. Aortic Valve: The aortic valve has an indeterminant number of cusps. Aortic valve regurgitation is not visualized. No aortic stenosis is present. Pulmonic Valve: The pulmonic valve was not well visualized. Pulmonic valve regurgitation is not visualized. No evidence of pulmonic stenosis. Aorta: The aortic root is normal in size and structure. Venous: The inferior vena cava is normal in size with greater than 50% respiratory variability, suggesting right atrial pressure of 3 mmHg. IAS/Shunts: No atrial level shunt detected by color flow Doppler.  LEFT VENTRICLE PLAX 2D LVIDd:         4.00 cm  Diastology LVIDs:         2.50 cm  LV e' medial:    7.40 cm/s LV PW:  0.90 cm  LV E/e' medial:  10.8 LV IVS:        0.80 cm  LV e' lateral:    7.94 cm/s LVOT diam:     1.80 cm  LV E/e' lateral: 10.0 LV SV:         61 LV SV Index:   39 LVOT Area:     2.54 cm  RIGHT VENTRICLE             IVC RV S prime:     16.00 cm/s  IVC diam: 2.00 cm TAPSE (M-mode): 2.5 cm LEFT ATRIUM             Index       RIGHT ATRIUM           Index LA diam:        3.00 cm 1.94 cm/m  RA Area:     14.70 cm LA Vol (A2C):   44.3 ml 28.71 ml/m RA Volume:   37.40 ml  24.24 ml/m LA Vol (A4C):   42.4 ml 27.48 ml/m LA Biplane Vol: 44.0 ml 28.52 ml/m  AORTIC VALVE LVOT Vmax:   116.00 cm/s LVOT Vmean:  71.200 cm/s LVOT VTI:    0.238 m  AORTA Ao Root diam: 2.90 cm MITRAL VALVE MV Area (PHT): 3.37 cm    SHUNTS MV Decel Time: 225 msec    Systemic VTI:  0.24 m MV E velocity: 79.70 cm/s  Systemic Diam: 1.80 cm MV A velocity: 63.80 cm/s MV E/A ratio:  1.25 Olga Millers MD Electronically signed by Olga Millers MD Signature Date/Time: 01/24/2021/4:20:42 PM    Final    VAS US CAROTID  Result Date: 01/23/2021 Carotid Arterial Duplex Study Patient Name:  Adam Mcclain  Date of Exam:   01/23/2021 Medical Rec #: [id number]      Accession #:    [id number] Date of Birth: [birth year]      Patient Gender: M Patient Age:   69Y Exam Location:  [facility] Procedure:      VAS US CAROTID Referring Phys: [id number] [facility] --------------------------------------------------------------------------------  Indications:       CVA. Risk Factors:      Hypertension, hyperlipidemia, current smoker, prior CVA,                    PAD. Other Factors:     Afib, CKD, COPD. Comparison Study:  No previous exams Performing Technologist: Ernestene Mention  Examination Guidelines: A complete evaluation includes B-mode imaging, spectral Doppler, color Doppler, and power Doppler as needed of all accessible portions of each vessel. Bilateral testing is considered an integral part of a complete examination. Limited examinations for reoccurring indications may be performed as noted.  Right Carotid Findings:  +----------+--------+--------+--------+------------------+------------------+           PSV cm/sEDV cm/sStenosisPlaque DescriptionComments           +----------+--------+--------+--------+------------------+------------------+ CCA Prox  58      20                                intimal thickening +----------+--------+--------+--------+------------------+------------------+ CCA Distal627     144     >50%    heterogenous      intimal thickening +----------+--------+--------+--------+------------------+------------------+ ICA Prox  77      16      1-39%   heterogenous      intimal thickening +----------+--------+--------+--------+------------------+------------------+ ICA Distal51      20                                                   +----------+--------+--------+--------+------------------+------------------+  ECA       318     0       >50%    heterogenous                         +----------+--------+--------+--------+------------------+------------------+ +----------+--------+-------+----------------+-------------------+           PSV cm/sEDV cmsDescribe        Arm Pressure (mmHG) +----------+--------+-------+----------------+-------------------+ [id number]            Multiphasic, WNL                    +----------+--------+-------+----------------+-------------------+ +---------+--------+--------+-----------------+ VertebralPSV cm/sEDV cm/sSeverely dampened +---------+--------+--------+-----------------+  Left Carotid Findings: +----------+-------+--------+--------+-----------------------+-----------------+           PSV    EDV cm/sStenosisPlaque Description     Comments                    cm/s                                                            +----------+-------+--------+--------+-----------------------+-----------------+ CCA Prox  97     25                                     intimal                                                                    thickening        +----------+-------+--------+--------+-----------------------+-----------------+ CCA Distal98     24              heterogenous           intimal                                                                   thickening        +----------+-------+--------+--------+-----------------------+-----------------+ ICA Prox  269    69      60-79%  heterogenous and                                                          calcific                                 +----------+-------+--------+--------+-----------------------+-----------------+ ICA Distal125    27                                                       +----------+-------+--------+--------+-----------------------+-----------------+  ECA       564    39      >50%    heterogenous                             +----------+-------+--------+--------+-----------------------+-----------------+ +----------+-------+--------+-------------------------------+------------------+           PSV    EDV cm/sDescribe                       Arm Pressure                 cm/s                                          (mmHG)             +----------+-------+--------+-------------------------------+------------------+ Subclavian71     13      Low resistive, post stenotic                                               flow                                              +----------+-------+--------+-------------------------------+------------------+ +---------+--------+--+--------+--+---------+ VertebralPSV cm/s42EDV cm/s13Antegrade +---------+--------+--+--------+--+---------+   Summary: Right Carotid: Velocities in the right ICA are consistent with a 1-39% stenosis.                Hemodynamically significant plaque 80-99% visualized in the CCA.                The ECA appears >50% stenosed. Left Carotid: Velocities in the left ICA are consistent with a 60-79% stenosis.                Non-hemodynamically significant plaque <50% noted in the CCA. The               ECA appears >50% stenosed. Vertebrals:  Left vertebral artery demonstrates antegrade flow. Right vertebral              artery shows severely dampened waveforms. Subclavians: Normal flow hemodynamics were seen in the right subclavian artery.              Left subclavian artery shows low resistive, post stenotic              waveforms. *See table(s) above for measurements and observations.  Vascular consult recommended. Electronically signed by Gretta Began MD on 01/23/2021 at 9:43:00 PM.    Final    VAS [place] TRANSCRANIAL DOPPLER  Result Date: 01/25/2021  Transcranial Doppler Patient Name:  Adam Mcclain  Date of Exam:   01/25/2021 Medical Rec #: [id number]      Accession #:    [id number] Date of Birth: 01/25/1951      Patient Gender: M Patient Age:   69Y Exam Location:  [facility] Procedure:      VAS [place] TRANSCRANIAL DOPPLER Referring Phys: [id number] DELILA A BAILEY-MODZIK --------------------------------------------------------------------------------  Indications: Stroke. Comparison Study: no prior Performing Technologist: Blanch Media RVS  Examination Guidelines: A complete evaluation includes B-mode imaging, spectral Doppler, color Doppler, and power  Doppler as needed of all accessible portions of each vessel. Bilateral testing is considered an integral part of a complete examination. Limited examinations for reoccurring indications may be performed as noted.  +----------+-------------+----------+-----------+-------+ RIGHT TCD Right VM (cm)Depth (cm)PulsatilityComment +----------+-------------+----------+-----------+-------+ MCA           58.00                 1.10            +----------+-------------+----------+-----------+-------+ ACA          -32.00                 0.88            +----------+-------------+----------+-----------+-------+ Term ICA      43.00                 1.01             +----------+-------------+----------+-----------+-------+ PCA           35.00                 0.60            +----------+-------------+----------+-----------+-------+ Opthalmic     11.00                 2.08            +----------+-------------+----------+-----------+-------+ ICA siphon    46.00                 1.27            +----------+-------------+----------+-----------+-------+ Vertebral    -28.00                 0.99            +----------+-------------+----------+-----------+-------+  +----------+------------+----------+-----------+------------------+ LEFT TCD  Left VM (cm)Depth (cm)Pulsatility     Comment       +----------+------------+----------+-----------+------------------+ MCA          20.00                 0.79                       +----------+------------+----------+-----------+------------------+ ACA          -23.00                0.61                       +----------+------------+----------+-----------+------------------+ Term ICA                                   unable to insonate +----------+------------+----------+-----------+------------------+ PCA                                        unable to insonate +----------+------------+----------+-----------+------------------+ Opthalmic    17.00                 1.17                       +----------+------------+----------+-----------+------------------+ ICA siphon   53.00                 1.18                       +----------+------------+----------+-----------+------------------+  Vertebral    -24.00                1.18                       +----------+------------+----------+-----------+------------------+  +------------+-------+-------+             VM cm/sComment +------------+-------+-------+ Dist Basilar-20.00         +------------+-------+-------+ Summary:  Poor left temporal window limits evaluation of anetrior circulation on left otherwise normal mean flow  velocities in majority of identified vessels of anterior and posterior cerebral circulation. *See table(s) above for TCD measurements and observations.  Diagnosing physician: Delia Heady MD Electronically signed by Delia Heady MD on 01/25/2021 at 11:58:40 AM.    Final      PHYSICAL EXAM  Temp:  [97.9 F (36.6 C)-100.2 F (37.9 C)] 98.1 F (36.7 C) (05/14 0800) Pulse Rate:  [70-95] 72 (05/14 1100) Resp:  [17-35] 17 (05/14 1100) BP: (98-147)/(73-97) 110/84 (05/14 1100) SpO2:  [88 %-100 %] 97 % (05/14 1100) FiO2 (%):  [30 %-40 %] 30 % (05/14 0802) Weight:  [58.9 kg] 58.9 kg (05/14 0400)  General - Well nourished, well developed, intubated on precedex.  Ophthalmologic - fundi not visualized due to noncooperation.  Cardiovascular - Regular rate and rhythm.  Neuro - intubated on precedex, eyes widely open, following most simple commands both midline and peripheral. Eyes in mid position, tracking bilaterally, but not blinking to visual threat on the left, inconsistently blinking to visual threat on the right, PERRL. Corneal reflex present, gag and cough present. Breathing over the vent.  Facial symmetry not able to test due to ET tube.  Tongue protrusion not cooperative. BUE on restrain, finger grip equally bilaterally, BLEs 4/5 proximal and 3/5 distally, equal bilaterally. No babinski. Sensation, coordination not cooperative and gait not tested.   ASSESSMENT/PLAN Adam Mcclain a 70 year old malewith a PMHx ofCVA, HTN, atrialfibrillation on aspirin and Plavix, COPD, and CKDpresenting with GTC seizuresfound to have a possible subcentimeter acute right parietal cortical infarct.   Stroke - several punctate acute right parietal cortical infarct, etiology uncertain, AF not AC vs. Right CCA high grade stenosis  CT: No evidence of acute intracranial abnormality.  MRI Possible subcentimeter acute right parietal cortical infarcts along the margin of a chronic parieto-occipital infarct.  Large chronic left PCA infarct and multiple chronic lacunar infarcts.   TCD unremarkable  Carotid Doppler: right CCA 80-99%.  Left ICA 60 to 79% stenosis.  Right ABI severely dampened waveforms.  Left subclavian artery low resistant, post stenotic waveforms  2D Echo EF 60 to 65%  VTE prophylaxis - heparin 5000 units ppx  No antithrombotics PTA, was on DAPT, currently Plavix on hold in preparation for tracheostomy.  Continue aspirin 325.  Therapy recommendations: Pending  Disposition:  Pending  A fib not on AC  A fib history not on anticoagulant but on DAPT prior to admission.   Sinus rhythm currently  Need to consider AC when no further procedure planned.  Seizure  No clare history of seizures.  cEEG with Triphasics 2-3 Hz on ictal-interictal spectrum but no seizures. Also notable for moderate to severe diffuse encephalopathy.   cEEG was disontinued after LTM for more than 48 hours with no seizures.  Continue Keppra  BID IV->PO  Carotid stenosis  history of right CEA 2017  Carotid Doppler: right CCA 80-99%.  Left ICA 60 to 79% stenosis.  Right ABI severely dampened waveforms.  Left subclavian  artery low resistant, post stenotic waveforms  Will consider CTA head and neck once more stable  Will consider revascularization according to CTA head and neck  Hypertension  Home meds: Norvasc 5, Lasix 20mg , Zestril 20mg    Currently on Levophed  Avoid low BP  Long-term BP goal 130-150 given severe carotid stenosis  Acute hypoxic resp failure  Aspiration PNA  Management per CCM, appreciated  Self -extubated 5/13 -> reintubated  +Moraxella catarrhalis on resp culture.   CCM planning IV Unasyn x 7 days.   Tracheostomy planned next week after Plavix washout  Hyperlipidemia  Home meds: Lipitor 40mg    LDL 43, goal < 70  LFT elevated, AST/ALT 56/54-> 65/93  Lipitor resumed  Continue statin at discharge  Dysphagia  NPO  Speech on  board  On TF @55cc /hr  Diabetes type II Controlled  HgbA1c 5.3, goal < 7.0  CBGs  SSI  Close outpatient PCP follow-up  Other Stroke Risk Factors  Advanced Age >/= 68   History of stroke Large chronic left PCA infarct and multiple chronic lacunar infarcts.  Other Active Problems  Hypokalemia, K3.1->3.6->3.9  Leukocytosis WBC 7.9->14.6  Hospital day # 8  This patient is critically ill due to right MCA stroke, A. fib not on AC, bilateral carotid stenosis, seizure, respiratory failure and at significant risk of neurological worsening, death form recurrent stroke, heart failure, chronic occlusion, status epilepticus. This patient's care requires constant monitoring of vital signs, hemodynamics, respiratory and cardiac monitoring, review of multiple databases, neurological assessment, discussion with family, other specialists and medical decision making of high complexity. I spent 40 minutes of neurocritical care time in the care of this patient.  I discussed with Dr. Chestine Spore CCM.  Marvel Plan, MD PhD Stroke Neurology 01/27/2021 11:30 AM    To contact Stroke Continuity provider, please refer to [email or web address]. After hours, contact General Neurology

## 2021-01-27 NOTE — Progress Notes (Addendum)
OT Cancellation Note  Patient Details Name: [name] MRN: [id number] DOB: February 20, 1951   Cancelled Treatment:    Reason Eval/Treat Not Completed: Other (comment) (Intubated. Decreased arousal. Will return as schedule allows.)  Shandora Koogler M [place] Freyre Kamara Allan MSOT, OTR/L Acute Rehab Pager: [phone] Office: [phone] 01/27/2021, 3:42 PM

## 2021-01-27 NOTE — Progress Notes (Signed)
Increased work of breathing , using accessory muscle. Requiring non re-breather to maintain sats . SpO 2 97% after non rebreather placement . Contacted and spoke to [facility] MD: high risk reintubation

## 2021-01-27 NOTE — Care Plan (Signed)
Holding plavix x  5 days for trach. Will be delayed until washout. D/w neurology-- planning to increase aspirin in the interim.  Steffanie Dunn, DO 01/27/21 11:19 AM [facility]

## 2021-01-28 ENCOUNTER — Encounter (HOSPITAL_COMMUNITY): Payer: Self-pay | Admitting: Critical Care Medicine

## 2021-01-28 ENCOUNTER — Inpatient Hospital Stay (HOSPITAL_COMMUNITY): Payer: Medicare Other

## 2021-01-28 DIAGNOSIS — I6330 Cerebral infarction due to thrombosis of unspecified cerebral artery: Secondary | ICD-10-CM | POA: Diagnosis not present

## 2021-01-28 DIAGNOSIS — I6523 Occlusion and stenosis of bilateral carotid arteries: Secondary | ICD-10-CM | POA: Diagnosis not present

## 2021-01-28 DIAGNOSIS — R569 Unspecified convulsions: Secondary | ICD-10-CM | POA: Diagnosis not present

## 2021-01-28 DIAGNOSIS — E781 Pure hyperglyceridemia: Secondary | ICD-10-CM

## 2021-01-28 DIAGNOSIS — J690 Pneumonitis due to inhalation of food and vomit: Secondary | ICD-10-CM

## 2021-01-28 LAB — BASIC METABOLIC PANEL
Anion gap: 7 (ref 5–15)
BUN: 19 mg/dL (ref 8–23)
CO2: 25 mmol/L (ref 22–32)
Calcium: 7.9 mg/dL — ABNORMAL LOW (ref 8.9–10.3)
Chloride: 109 mmol/L (ref 98–111)
Creatinine, Ser: 1.16 mg/dL (ref 0.61–1.24)
GFR, Estimated: >60 mL/min (ref >60)
Glucose, Bld: 200 mg/dL — ABNORMAL HIGH (ref 70–99)
Potassium: 3.3 mmol/L — ABNORMAL LOW (ref 3.5–5.1)
Sodium: 141 mmol/L (ref 135–145)

## 2021-01-28 LAB — CBC
HCT: 33.1 % — ABNORMAL LOW (ref 39.0–52.0)
Hemoglobin: 11.2 g/dL — ABNORMAL LOW (ref 13.0–17.0)
MCH: 32.2 pg (ref 26.0–34.0)
MCHC: 33.8 g/dL (ref 30.0–36.0)
MCV: 95.1 fL (ref 80.0–100.0)
Platelets: 248 10*3/uL (ref 150–400)
RBC: 3.48 MIL/uL — ABNORMAL LOW (ref 4.22–5.81)
RDW: 13.2 % (ref 11.5–15.5)
WBC: 9.2 10*3/uL (ref 4.0–10.5)
nRBC: 0 % (ref 0.0–0.2)

## 2021-01-28 LAB — GLUCOSE, CAPILLARY
Glucose-Capillary: 104 mg/dL — ABNORMAL HIGH (ref 70–99)
Glucose-Capillary: 108 mg/dL — ABNORMAL HIGH (ref 70–99)
Glucose-Capillary: 155 mg/dL — ABNORMAL HIGH (ref 70–99)
Glucose-Capillary: 190 mg/dL — ABNORMAL HIGH (ref 70–99)
Glucose-Capillary: 69 mg/dL — ABNORMAL LOW (ref 70–99)
Glucose-Capillary: 73 mg/dL (ref 70–99)

## 2021-01-28 LAB — MAGNESIUM: Magnesium: 2.3 mg/dL (ref 1.7–2.4)

## 2021-01-28 MED ORDER — QUETIAPINE FUMARATE 25 MG PO TABS
50.0000 mg | ORAL_TABLET | Freq: Two times a day (BID) | ORAL | Status: DC
  Administered 2021-01-28 – 2021-01-31 (×8): 50 mg
  Filled 2021-01-28 (×8): qty 2

## 2021-01-28 MED ORDER — POTASSIUM CHLORIDE 10 MEQ/100ML IV SOLN
10.0000 meq | INTRAVENOUS | Status: AC
  Administered 2021-01-28 (×4): 10 meq via INTRAVENOUS
  Filled 2021-01-28 (×4): qty 100

## 2021-01-28 MED ORDER — DEXTROSE 50 % IV SOLN: 12.5000 g | INTRAVENOUS | Status: AC

## 2021-01-28 MED ORDER — DEXTROSE 50 % IV SOLN
INTRAVENOUS | Status: AC
  Administered 2021-01-28: 12.5 g via INTRAVENOUS
  Filled 2021-01-28: qty 50

## 2021-01-28 MED ORDER — IOHEXOL 350 MG/ML SOLN
50.0000 mL | Freq: Once | INTRAVENOUS | Status: AC | PRN
  Administered 2021-01-28: 50 mL via INTRAVENOUS

## 2021-01-28 MED ORDER — POTASSIUM CHLORIDE 20 MEQ PO PACK
20.0000 meq | PACK | ORAL | Status: AC
  Administered 2021-01-28 (×2): 20 meq
  Filled 2021-01-28 (×2): qty 1

## 2021-01-28 NOTE — Progress Notes (Signed)
eLink Physician-Brief Progress Note Patient Name: Adam Mcclain DOB: 04-03-1951 MRN: [id number]   Date of Service  01/28/2021  HPI/Events of Note  Patient with sinus bradycardia while on Precedex. EKG shows sinus bradycardia to 50 bpm. There are non-specific TWIs throughout. QTc is prolonged. No prior EKG for comparison.  K 3.3 this AM (repletion already ordered). No Mg checked.   eICU Interventions  Ordered Mg level as add-on. Precedex has been stopped. Continue to monitor.     Intervention Category Intermediate Interventions: Arrhythmia - evaluation and management  Janae Bridgeman 01/28/2021, 6:39 AM

## 2021-01-28 NOTE — Progress Notes (Signed)
Spoke with Elink RN Amil Amen concerned about bradycardia with HR mid 40s, but recently had pause and HR as low as 37.  Advised to hold precedex for now and continue versed infusion.  Pt adequately sedated at this time.  Will follow-up as needed.

## 2021-01-28 NOTE — Progress Notes (Signed)
Transported pt to CT and back to room 4N29 on servo. No issues

## 2021-01-28 NOTE — Progress Notes (Signed)
NAME:  JIMMY PLESSINGER, MRN:  [id number], DOB:  05/20/1951, LOS: 9 ADMISSION DATE:  01/19/2021, CONSULTATION DATE: 5/6 REFERRING MD: Dr. Rubin Payor, CHIEF COMPLAINT: Seizure  History of Present Illness:  70 year old male with past medical history as below, which is significant for CVA, HTN, afibrillation on aspirin and Plavix, COPD, and CKD.  In the midday hours of 5/6 he had accompanied by family member to a doctor's appointment.  He was waiting in the car when a bystander walked by and noticed he had a gaze deviation and was shaking.  EMS was called.  Upon EMS arrival the patient was unresponsive and was administered 10 mg of Versed.  He was bagged in route to the emergency department where he was promptly intubated on presentation.  The ED physician did note some rigidity in the upper extremities concerning for seizure activity.  CT of the head was nonacute.  The patient was evaluated by neurology who provided a loading dose of Keppra and started EEG monitoring.  PCCM was asked to admit the patient due to the requirement of mechanical ventilation.  Significant Hospital Events: Including procedures, antibiotic start and stop dates in addition to other pertinent events   . 5/6 admit to [facility] for seizures on the ventilator. . 5/7 EEG shows no seizure activity . 5/8 propofol was turned off, patient is spiked fever but white count trended down . 5/9 EEG was negative for seizures, MRI was done showed very small right parietal stroke . 5/10 trial of extubation, patient is struggled had to be reintubated . 5/11 MRI brain showed subcentimeter new stroke . 5/12 tolerated pressure support, extubated and had to be reintubated because of respiratory distress . 5/13 patient was a scheduled for tracheostomy but he self extubated early this morning at shift change . 5/14 reintubated overnight for uncontrolled WOB and inability to manage secretions. Plavix stopped in prep for trach. .   Interim History /  Subjective:  Awake, he denies complaints.  Objective   Blood pressure (!) 82/66, pulse (!) 55, temperature (!) 96.6 F (35.9 C), temperature source Axillary, resp. rate 20, weight 63 kg, SpO2 (!) 89 %.    Vent Mode: PRVC FiO2 (%):  [30 %] 30 % Set Rate:  [18 bmp] 18 bmp Vt Set:  [580 mL] 580 mL PEEP:  [5 cmH20] 5 cmH20 Plateau Pressure:  [15 cmH20-18 cmH20] 16 cmH20   Intake/Output Summary (Last 24 hours) at 01/28/2021 1043 Last data filed at 01/28/2021 8588 Gross per 24 hour  Intake 2136.43 ml  Output 1900 ml  Net 236.43 ml   Filed Weights   01/24/21 0500 01/27/21 0400 01/28/21 0400  Weight: 60.6 kg 58.9 kg 63 kg    Examination: General: critically il appearing man lying in bed in NAD HENT: [place]/AT, eyes anicteric Lungs: rhonchi, thick secretions from ETT but clear Cardiovascular: S1S2, RRR Abdomen: soft, NT Extremities: no c/c/e Neuro: RASS 0, moving all extremities on command against gravity. Skin: no rashes, warm & dry  Labs/imaging that I have personally reviewed  (right click and "Reselect all SmartList Selections" daily)  Na 141 K 3.3 Cl 109 Cr 1.16 WBC 9.2 Hgb 11.2  5/9 Respiratory culture: Grew Moraxella catarrhalis, B- lactamase +  RUQ [place] 5/14> no cystic duct dilation, prev cholecystectomy. Multiple hepatic cysts  Resolved Hospital Problem list   AKI on CKD stage III A  Assessment & Plan:   New onset Seizure from previous L PCA infarction Acute right parietal stroke -EEG was negative -Con't  keppra, appreciate neurology's recommendations -May need TEE prior to discharge to assess for LAA thrombus -Con't ASA, plavix on hold for 5 days for trach. Atorvastatin daily. -Con't to hold wellbutrin, probably should not be restarted due to seizure risk  Acute hypoxemic respiratory failure in the setting of seizure and aspiration pneumonia with Moraxella catarrhalis. Failed extubated x 3 now. -discussed with brother on 5/14, planning for tracheostomy this week  after plavix washout -LTVV -VAP prevention protocol -PAD protocol for sedation; goal RASS 0 to -1. Adding seroquel to help with agitation. -Continue IV Unasyn for 7 days (day #6) -titrate FiO2 to maintain SpO2 >90%  Bilateral carotid stenosis Carotid vascular ultrasound showed severe right internal carotid artery stenosis 80-99%, moderate to severe stenosis on the left 60 to 79% -Once stable, he will need IR guided ICA stent -aspirin ok, but plavix on hold due to need for trach; d/w neurology  Paroxysmal atrial fibrillation: Not on AC HTN -holding amlodipine due to hypotension on precedex -Continue to hold HCTZ and lisinopril  Hypokalemia/hypophosphatemia- resolved -Con't to monitor  Hypertriglyceridemia -Continue atorvastatin -Avoid Cleviprex & propofol   COPD history documented. No associated home meds listed. No evidence of acute exacerbation.  -PRN Duonebs; if needing frequently can add long-acting BDs  Hyperbilirubinemia, elevated LFTs -recheck tomorrow; if worsening may need to stop statin  Best practice (right click and "Reselect all SmartList Selections" daily)  Diet:  Tube Feed .  Pain/Anxiety/Delirium protocol (if indicated): yes VAP protocol (if indicated): yes DVT prophylaxis: Subcutaneous Heparin GI prophylaxis: PPI Glucose control:  SSI Yes Central venous access:  N/A Arterial line:  N/A Foley:  N/A Mobility: Out of bed to chair PT consulted: Yes Last date of multidisciplinary goals of care discussion [5/14: trach planned after plavix washout; d/w brother [name] at bedside with RN present  Code Status:  full code Disposition: ICU  This patient is critically ill with multiple organ system failure which requires frequent high complexity decision making, assessment, support, evaluation, and titration of therapies. This was completed through the application of advanced monitoring technologies and extensive interpretation of multiple databases. During this  encounter critical care time was devoted to patient care services described in this note for 36 minutes.  Steffanie Dunn, DO 01/28/21 11:01 AM [facility]

## 2021-01-28 NOTE — Progress Notes (Signed)
STROKE TEAM PROGRESS NOTE   SUBJECTIVE (INTERVAL HISTORY) No family is at the bedside.  Pt intubated on precedex, more awake alert today and eyes wide open, following most simple commands. BP and HR are low, likely due to precedex. Still has copious thick secretions.     OBJECTIVE Temp:  [96.6 F (35.9 C)-98.2 F (36.8 C)] 96.6 F (35.9 C) (05/15 0800) Pulse Rate:  [42-82] 55 (05/15 0734) Cardiac Rhythm: Normal sinus rhythm (05/14 2000) Resp:  [17-22] 20 (05/15 0734) BP: (71-126)/(61-100) 82/66 (05/15 0734) SpO2:  [89 %-99 %] 89 % (05/15 0734) FiO2 (%):  [30 %] 30 % (05/15 0734) Weight:  [63 kg] 63 kg (05/15 0400)  Recent Labs  Lab 01/27/21 1544 01/27/21 1942 01/27/21 2331 01/28/21 0329 01/28/21 0814  GLUCAP 144* 160* 178* 190* 155*   Recent Labs  Lab 01/21/21 1631 01/22/21 0400 01/22/21 0400 01/23/21 0433 01/25/21 0414 01/25/21 1621 01/27/21 0607 01/27/21 0617 01/28/21 0042  NA  --  140   < > 141 143 143 141 143 141  K  --  3.8   < > 3.9 3.1* 3.6 4.8 3.9 3.3*  CL  --  113*  --  109 114*  --  107  --  109  CO2  --  21*  --  24 24  --  21*  --  25  GLUCOSE  --  175*  --  157* 126*  --  174*  --  200*  BUN  --  18  --  22 31*  --  20  --  19  CREATININE  --  1.28*  --  1.28* 1.19  --  1.23  --  1.16  CALCIUM  --  8.3*   < > 8.5* 8.2*  --  8.2*  --  7.9*  MG 2.3 2.0  --   --  1.9  --   --   --   --   PHOS 1.9* 2.3*  --  2.6 2.3*  --   --   --   --    < > = values in this interval not displayed.   Recent Labs  Lab 01/25/21 0414 01/27/21 0607  AST 56* 65*  ALT 54* 93*  ALKPHOS 84 93  BILITOT 0.3 1.9*  PROT 5.4* 6.1*  ALBUMIN 1.9* 2.2*   Recent Labs  Lab 01/22/21 0400 01/25/21 0414 01/25/21 1621 01/27/21 0607 01/27/21 0617 01/28/21 0042  WBC 7.9 7.9  --  14.6*  --  9.2  HGB 12.1* 11.2* 10.9* 12.9* 11.9* 11.2*  HCT 35.9* 34.5* 32.0* 38.9* 35.0* 33.1*  MCV 95.5 96.6  --  96.3  --  95.1  PLT 110* 130*  --  300  --  248   No results for input(s):  CKTOTAL, CKMB, CKMBINDEX, TROPONINI in the last 168 hours. No results for input(s): LABPROT, INR in the last 72 hours. No results for input(s): COLORURINE, LABSPEC, PHURINE, GLUCOSEU, HGBUR, BILIRUBINUR, KETONESUR, PROTEINUR, UROBILINOGEN, NITRITE, LEUKOCYTESUR in the last 72 hours.  Invalid input(s): APPERANCEUR     Component Value Date/Time   CHOL 118 01/23/2021 1215   TRIG 309 (H) 01/26/2021 0436   HDL 15 (L) 01/23/2021 1215   CHOLHDL 7.9 01/23/2021 1215   VLDL 60 (H) 01/23/2021 1215   LDLCALC 43 01/23/2021 1215   Lab Results  Component Value Date   HGBA1C 5.3 01/23/2021      Component Value Date/Time   LABOPIA NONE DETECTED 01/19/2021 1237   COCAINSCRNUR NONE DETECTED 01/19/2021  1237   LABBENZ POSITIVE (A) 01/19/2021 1237   AMPHETMU NONE DETECTED 01/19/2021 1237   THCU NONE DETECTED 01/19/2021 1237   LABBARB NONE DETECTED 01/19/2021 1237    No results for input(s): ETH in the last 168 hours.  I have personally reviewed the radiological images below and agree with the radiology interpretations.  DG Chest 1 View  Result Date: 01/25/2021 CLINICAL DATA:  Intubation EXAM: CHEST  1 VIEW COMPARISON:  Portable exam 1553 hours compared to 01/23/2021 FINDINGS: Tip of endotracheal tube projects 2.9 cm above carina. Normal heart size, mediastinal contours, and pulmonary vascularity. Minimal atelectasis at lung bases. No definite infiltrate, pleural effusion or pneumothorax. IMPRESSION: Minimal bibasilar atelectasis. Electronically Signed   By: Ulyses SouthwardMark  Boles M.D.   On: 01/25/2021 17:12   DG Abd 1 View  Result Date: 01/25/2021 CLINICAL DATA:  Check gastric catheter placement EXAM: ABDOMEN - 1 VIEW COMPARISON:  None. FINDINGS: Gastric catheter is noted with the tip in the stomach. Proximal side port lies in the distal esophagus however. This should be advanced several cm deeper into the stomach. IMPRESSION: Gastric catheter as described which should be advanced further into the stomach.  Electronically Signed   By: Alcide CleverMark  Lukens M.D.   On: 01/25/2021 18:50   CT Head Wo Contrast  Result Date: 01/19/2021 CLINICAL DATA:  Found down.  Possible seizure. EXAM: CT HEAD WITHOUT CONTRAST TECHNIQUE: Contiguous axial images were obtained from the base of the skull through the vertex without intravenous contrast. COMPARISON:  CT head December 30, 2020. FINDINGS: Brain: Similar encephalomalacia in the anterolateral right temporal lobe, and bilateral occipital lobes. Similar small remote lacunar infarct versus dilated perivascular space in the left basal ganglia. No evidence of acute large vascular territory infarct. Moderate patchy white matter hypodensities, similar and likely related to chronic microvascular ischemic disease. No acute hemorrhage. Similar ex vacuo ventricular dilation without hydrocephalus. No visible extra-axial fluid collection. No mass lesion or abnormal mass effect. Vascular: No hyperdense vessel.  Calcific atherosclerosis. Skull: No acute fracture. Sinuses/Orbits: Sinuses are largely clear. No acute orbital abnormality. Other: No mastoid effusions. Debris in bilateral external auditory canals, presumably cerumen. Fluid layering in the pharynx, likely related to intubation. IMPRESSION: 1. No evidence of acute intracranial abnormality. 2. Similar encephalomalacia in the anterolateral right temporal lobe and bilateral occipital lobes. 3. Moderate chronic microvascular ischemic disease. Electronically Signed   By: Feliberto HartsFrederick S Jones MD   On: 01/19/2021 12:53   MR BRAIN WO CONTRAST  Result Date: 01/22/2021 CLINICAL DATA:  Seizure. EXAM: MRI HEAD WITHOUT CONTRAST TECHNIQUE: Multiplanar, multiecho pulse sequences of the brain and surrounding structures were obtained without intravenous contrast. COMPARISON:  Head CT 01/19/2021 and MRI 03/04/2019 FINDINGS: Brain: There is a moderate-sized chronic right parieto-occipital infarct with associated chronic blood products. Scattered subcentimeter foci  of trace diffusion weighted signal hyperintensity in this region are largely felt to be secondary to susceptibility artifact from chronic blood products, however there may be a superimposed subcentimeter acute cortical infarct in the right parietal lobe along the anterosuperior aspect of the chronic encephalomalacia (series 3, image 36). No acute infarct is identified elsewhere. There is a large chronic left PCA infarct with chronic blood products. There is also encephalomalacia anteriorly in the right temporal lobe which may reflect an old infarct or be secondary to remote trauma. Patchy T2 hyperintensities in the cerebral white matter bilaterally are nonspecific but compatible with moderate chronic small vessel ischemic disease. Chronic lacunar infarcts are noted in the deep white matter of the  right cerebral hemisphere and in the left basal ganglia. There is mild to moderate cerebral atrophy. Scattered chronic microhemorrhages are noted in the cerebrum and cerebellum including in the deep gray nuclei and may reflect the sequelae of chronic hypertension. No mass, midline shift, or extra-axial fluid collection is evident. Vascular: Major intracranial vascular flow voids are preserved. Skull and upper cervical spine: Unremarkable bone marrow signal. Congenital C2-3 fusion. Sinuses/Orbits: Bilateral cataract extraction. Clear paranasal sinuses. Small to moderate bilateral mastoid effusions. Other: Fluid in the pharynx in the setting of intubation. IMPRESSION: 1. Possible subcentimeter acute right parietal cortical infarct along the margin of a chronic parieto-occipital infarct. 2. Large chronic left PCA infarct. 3. Encephalomalacia in the anterior right temporal lobe. 4. Moderate chronic small vessel ischemic disease with multiple chronic lacunar infarcts. Electronically Signed   By: Sebastian Ache M.D.   On: 01/22/2021 16:37   DG CHEST PORT 1 VIEW  Result Date: 01/27/2021 CLINICAL DATA:  Encounter for intubation  of airway. EXAM: PORTABLE CHEST 1 VIEW COMPARISON:  01/25/2021 FINDINGS: ET tube tip is above the carina. There is a feeding tube with tip well below the GE junction in the projection of the right abdomen. Mild bibasilar opacities. No pleural effusions or interstitial edema. IMPRESSION: 1. ET tube tip above the carina. 2. Mild bibasilar opacities compatible with atelectasis versus pneumonia. Electronically Signed   By: Signa Kell M.D.   On: 01/27/2021 05:57   DG CHEST PORT 1 VIEW  Result Date: 01/23/2021 CLINICAL DATA:  Hypoxia EXAM: PORTABLE CHEST 1 VIEW COMPARISON:  Jan 21, 2021 FINDINGS: Endotracheal tube tip is at the carina directed toward the right main bronchus. Nasogastric tube tip and side port in stomach. No edema or airspace opacity. Apparent nipple shadow on the left, stable. Heart size and pulmonary vascularity are normal. No adenopathy. No bone lesions. IMPRESSION: Endotracheal tube tip at carina directed toward the right main bronchus. Advise withdrawing endotracheal tube approximately 3 cm. No edema or airspace opacity. These results will be called to the ordering clinician or representative by the Radiologist Assistant, and communication documented in the PACS or [employer]. Electronically Signed   By: Bretta Bang III M.D.   On: 01/23/2021 09:53   DG CHEST PORT 1 VIEW  Result Date: 01/21/2021 CLINICAL DATA:  Unresponsive, respirator dependent EXAM: PORTABLE CHEST 1 VIEW COMPARISON:  01/19/2021 chest radiograph. FINDINGS: Endotracheal tube tip is 3.0 cm above the carina. Enteric tube enters stomach with the tip not seen on this image. Stable cardiomediastinal silhouette with normal heart size. No pneumothorax. No pleural effusion. Mild medial bibasilar atelectasis, new. No pulmonary edema. IMPRESSION: 1. Well-positioned endotracheal and enteric tubes. 2. New mild medial bibasilar atelectasis. Electronically Signed   By: Delbert Phenix M.D.   On: 01/21/2021 08:39   DG Chest  Portable 1 View  Result Date: 01/19/2021 CLINICAL DATA:  Unresponsive. EXAM: PORTABLE CHEST 1 VIEW COMPARISON:  Chest x-ray dated December 30, 2020. FINDINGS: Endotracheal tube in place with the tip 4.7 cm above the carina. Enteric tube in the stomach. The heart size and mediastinal contours are within normal limits. Normal pulmonary vascularity. No focal consolidation, pleural effusion, or pneumothorax. No acute osseous abnormality. Prior cholecystectomy. IMPRESSION: 1. Appropriately positioned endotracheal and enteric tubes. 2. No active disease. Electronically Signed   By: Obie Dredge M.D.   On: 01/19/2021 12:30   DG Abd Portable 1V  Result Date: 01/20/2021 CLINICAL DATA:  Enteric tube placement. EXAM: PORTABLE ABDOMEN - 1 VIEW COMPARISON:  Chest x-ray  01/19/2021 FINDINGS: Evidence of patient's enteric tube in adequate position with tip just left of midline over the stomach in the upper abdomen. Surgical clips over the gallbladder fossa. Nonobstructive bowel gas pattern. Remainder the exam is unchanged. IMPRESSION: Enteric tube with tip just left of midline over the stomach in the upper abdomen. Electronically Signed   By: Elberta Fortis M.D.   On: 01/20/2021 10:49   EEG adult  Result Date: 01/19/2021 Charlsie Quest, MD     01/19/2021  1:30 PM Patient Name: EYTAN CARRIGAN MRN: [id number] Epilepsy Attending: Charlsie Quest Referring Physician/Provider: Dr Caryl Pina Date: 01/19/2021 Duration: 27.02 mins Patient history: 70 year old male presenting with GTC seizures. EEG to evaluate for seizure Level of alertness:  lethargic AEDs during EEG study: LEV, Versed Technical aspects: This EEG study was done with scalp electrodes positioned according to the 10-20 International system of electrode placement. Electrical activity was acquired at a sampling rate of  and reviewed with a high frequency filter of  and a low frequency filter of . EEG data were recorded continuously and digitally stored.  Description: EEG showed continuous generalized polymorphic sharply contoured 3 to 6 Hz theta-delta slowing. There is an excessive amount of 15 to 18 Hz beta activity with irregular morphology distributed symmetrically and diffusely. Hyperventilation and photic stimulation were not performed.   ABNORMALITY - Continuous slow, generalized - Excessive beta, generalized IMPRESSION: This study is suggestive of moderate diffuse encephalopathy, nonspecific etiology. The excessive beta activity seen in the background is most likely due to the effect of benzodiazepine and is a benign EEG pattern. No seizures or epileptiform discharges were seen throughout the recording. Priyanka Annabelle Harman   Overnight EEG with video  Result Date: 01/21/2021 Charlsie Quest, MD     01/21/2021  9:34 AM Patient Name: LEX LINHARES MRN: [id number] Epilepsy Attending: Charlsie Quest Referring Physician/Provider: Dr Caryl Pina Duration: 01/20/2021 0809 to 01/21/2021 0809  Patient history: 70 year old male presenting with GTC seizures. EEG to evaluate for seizure  Level of alertness:  comatose/sedated  AEDs during EEG study: LEV, Versed, propofol  Technical aspects: This EEG study was done with scalp electrodes positioned according to the 10-20 International system of electrode placement. Electrical activity was acquired at a sampling rate of  and reviewed with a high frequency filter of  and a low frequency filter of . EEG data were recorded continuously and digitally stored.  Description: EEG initially showed burst suppression pattern with bursts of low amplitude 15-18hz  beta activity admixed with 5-7hz  theta slowing lasting 1-2 seconds alternating with generalized suppression lasting 2-3 seconds.Gradually, as propofol was weaned off around 1730 on 01/20/2021, eeg showed continuous generalized polymorphic sharply contoured 3 to 6 Hz theta-delta slowing at times with triphasic morphology. Hyperventilation and photic stimulation were  not performed.    ABNORMALITY - Burst suppression, generalized - Continuous slow, generalized  IMPRESSION: This study was initially suggestive of profound diffuse encephalopathy likely due to sedation. As propofol was weaned, eeg was suggestive of moderate to severe diffuse encephalopathy, nonspecific etiology. No seizures or epileptiform discharges were seen throughout the recording.  Charlsie Quest   ECHOCARDIOGRAM COMPLETE  Result Date: 01/24/2021    ECHOCARDIOGRAM REPORT   Patient Name:   COYE DAWOOD Date of Exam: 01/24/2021 Medical Rec #:  [id number]     Height: Accession #:    [id number]    Weight:       133.6 lb Date of Birth:  May 01, 1951  BSA:          1.543 m Patient Age:    69 years      BP:           101/69 mmHg Patient Gender: M             HR:           60 bpm. Exam Location:  Inpatient Procedure: 2D Echo Indications:    stroke  History:        Patient has no prior history of Echocardiogram examinations.                 COPD and chronic kidney disease, Arrythmias:Atrial Fibrillation;                 Risk Factors:Hypertension.  Sonographer:    Delcie Roch Referring Phys: [id number] [facility]  Sonographer Comments: Echo performed with patient supine and on artificial respirator and suboptimal parasternal window. Image acquisition challenging due to uncooperative patient. IMPRESSIONS  1. Left ventricular ejection fraction, by estimation, is 60 to 65%. The left ventricle has normal function. The left ventricle has no regional wall motion abnormalities. Left ventricular diastolic parameters were normal.  2. Right ventricular systolic function is normal. The right ventricular size is normal.  3. The mitral valve is normal in structure. No evidence of mitral valve regurgitation. No evidence of mitral stenosis.  4. The aortic valve has an indeterminant number of cusps. Aortic valve regurgitation is not visualized. No aortic stenosis is present.  5. The inferior vena cava is normal in  size with greater than 50% respiratory variability, suggesting right atrial pressure of 3 mmHg. FINDINGS  Left Ventricle: Left ventricular ejection fraction, by estimation, is 60 to 65%. The left ventricle has normal function. The left ventricle has no regional wall motion abnormalities. The left ventricular internal cavity size was normal in size. There is  no left ventricular hypertrophy. Left ventricular diastolic parameters were normal. Right Ventricle: The right ventricular size is normal.Right ventricular systolic function is normal. Left Atrium: Left atrial size was normal in size. Right Atrium: Right atrial size was normal in size. Pericardium: There is no evidence of pericardial effusion. Mitral Valve: The mitral valve is normal in structure. No evidence of mitral valve regurgitation. No evidence of mitral valve stenosis. Tricuspid Valve: The tricuspid valve is normal in structure. Tricuspid valve regurgitation is trivial. No evidence of tricuspid stenosis. Aortic Valve: The aortic valve has an indeterminant number of cusps. Aortic valve regurgitation is not visualized. No aortic stenosis is present. Pulmonic Valve: The pulmonic valve was not well visualized. Pulmonic valve regurgitation is not visualized. No evidence of pulmonic stenosis. Aorta: The aortic root is normal in size and structure. Venous: The inferior vena cava is normal in size with greater than 50% respiratory variability, suggesting right atrial pressure of 3 mmHg. IAS/Shunts: No atrial level shunt detected by color flow Doppler.  LEFT VENTRICLE PLAX 2D LVIDd:         4.00 cm  Diastology LVIDs:         2.50 cm  LV e' medial:    7.40 cm/s LV PW:         0.90 cm  LV E/e' medial:  10.8 LV IVS:        0.80 cm  LV e' lateral:   7.94 cm/s LVOT diam:     1.80 cm  LV E/e' lateral: 10.0 LV SV:         61 LV SV  Index:   39 LVOT Area:     2.54 cm  RIGHT VENTRICLE             IVC RV S prime:     16.00 cm/s  IVC diam: 2.00 cm TAPSE (M-mode): 2.5 cm  LEFT ATRIUM             Index       RIGHT ATRIUM           Index LA diam:        3.00 cm 1.94 cm/m  RA Area:     14.70 cm LA Vol (A2C):   44.3 ml 28.71 ml/m RA Volume:   37.40 ml  24.24 ml/m LA Vol (A4C):   42.4 ml 27.48 ml/m LA Biplane Vol: 44.0 ml 28.52 ml/m  AORTIC VALVE LVOT Vmax:   116.00 cm/s LVOT Vmean:  71.200 cm/s LVOT VTI:    0.238 m  AORTA Ao Root diam: 2.90 cm MITRAL VALVE MV Area (PHT): 3.37 cm    SHUNTS MV Decel Time: 225 msec    Systemic VTI:  0.24 m MV E velocity: 79.70 cm/s  Systemic Diam: 1.80 cm MV A velocity: 63.80 cm/s MV E/A ratio:  1.25 Olga Millers MD Electronically signed by Olga Millers MD Signature Date/Time: 01/24/2021/4:20:42 PM    Final    VAS US CAROTID  Result Date: 01/23/2021 Carotid Arterial Duplex Study Patient Name:  WYETH HOFFER  Date of Exam:   01/23/2021 Medical Rec #: [id number]      Accession #:    [id number] Date of Birth: 10/04/1950      Patient Gender: M Patient Age:   69Y Exam Location:  [facility] Procedure:      VAS US CAROTID Referring Phys: [id number] [facility] --------------------------------------------------------------------------------  Indications:       CVA. Risk Factors:      Hypertension, hyperlipidemia, current smoker, prior CVA,                    PAD. Other Factors:     Afib, CKD, COPD. Comparison Study:  No previous exams Performing Technologist: Ernestene Mention  Examination Guidelines: A complete evaluation includes B-mode imaging, spectral Doppler, color Doppler, and power Doppler as needed of all accessible portions of each vessel. Bilateral testing is considered an integral part of a complete examination. Limited examinations for reoccurring indications may be performed as noted.  Right Carotid Findings: +----------+--------+--------+--------+------------------+------------------+           PSV cm/sEDV cm/sStenosisPlaque DescriptionComments            +----------+--------+--------+--------+------------------+------------------+ CCA Prox  58      20                                intimal thickening +----------+--------+--------+--------+------------------+------------------+ CCA Distal627     144     >50%    heterogenous      intimal thickening +----------+--------+--------+--------+------------------+------------------+ ICA Prox  77      16      1-39%   heterogenous      intimal thickening +----------+--------+--------+--------+------------------+------------------+ ICA Distal51      20                                                   +----------+--------+--------+--------+------------------+------------------+ ECA  318     0       >50%    heterogenous                         +----------+--------+--------+--------+------------------+------------------+ +----------+--------+-------+----------------+-------------------+           PSV cm/sEDV cmsDescribe        Arm Pressure (mmHG) +----------+--------+-------+----------------+-------------------+ [id number]            Multiphasic, WNL                    +----------+--------+-------+----------------+-------------------+ +---------+--------+--------+-----------------+ VertebralPSV cm/sEDV cm/sSeverely dampened +---------+--------+--------+-----------------+  Left Carotid Findings: +----------+-------+--------+--------+-----------------------+-----------------+           PSV    EDV cm/sStenosisPlaque Description     Comments                    cm/s                                                            +----------+-------+--------+--------+-----------------------+-----------------+ CCA Prox  97     25                                     intimal                                                                   thickening        +----------+-------+--------+--------+-----------------------+-----------------+ CCA Distal98      24              heterogenous           intimal                                                                   thickening        +----------+-------+--------+--------+-----------------------+-----------------+ ICA Prox  269    69      60-79%  heterogenous and                                                          calcific                                 +----------+-------+--------+--------+-----------------------+-----------------+ ICA Distal125    27                                                       +----------+-------+--------+--------+-----------------------+-----------------+  ECA       564    39      >50%    heterogenous                             +----------+-------+--------+--------+-----------------------+-----------------+ +----------+-------+--------+-------------------------------+------------------+           PSV    EDV cm/sDescribe                       Arm Pressure                 cm/s                                          (mmHG)             +----------+-------+--------+-------------------------------+------------------+ Subclavian71     13      Low resistive, post stenotic                                               flow                                              +----------+-------+--------+-------------------------------+------------------+ +---------+--------+--+--------+--+---------+ VertebralPSV cm/s42EDV cm/s13Antegrade +---------+--------+--+--------+--+---------+   Summary: Right Carotid: Velocities in the right ICA are consistent with a 1-39% stenosis.                Hemodynamically significant plaque 80-99% visualized in the CCA.                The ECA appears >50% stenosed. Left Carotid: Velocities in the left ICA are consistent with a 60-79% stenosis.               Non-hemodynamically significant plaque <50% noted in the CCA. The               ECA appears >50% stenosed. Vertebrals:  Left vertebral artery  demonstrates antegrade flow. Right vertebral              artery shows severely dampened waveforms. Subclavians: Normal flow hemodynamics were seen in the right subclavian artery.              Left subclavian artery shows low resistive, post stenotic              waveforms. *See table(s) above for measurements and observations.  Vascular consult recommended. Electronically signed by Gretta Began MD on 01/23/2021 at 9:43:00 PM.    Final    VAS [place] TRANSCRANIAL DOPPLER  Result Date: 01/25/2021  Transcranial Doppler Patient Name:  KASTIEL SIMONIAN  Date of Exam:   01/25/2021 Medical Rec #: [id number]      Accession #:    [id number] Date of Birth: February 27, 1951      Patient Gender: M Patient Age:   69Y Exam Location:  [facility] Procedure:      VAS [place] TRANSCRANIAL DOPPLER Referring Phys: [id number] DELILA A BAILEY-MODZIK --------------------------------------------------------------------------------  Indications: Stroke. Comparison Study: no prior Performing Technologist: Blanch Media RVS  Examination Guidelines: A complete evaluation includes B-mode imaging, spectral Doppler, color Doppler, and power  Doppler as needed of all accessible portions of each vessel. Bilateral testing is considered an integral part of a complete examination. Limited examinations for reoccurring indications may be performed as noted.  +----------+-------------+----------+-----------+-------+ RIGHT TCD Right VM (cm)Depth (cm)PulsatilityComment +----------+-------------+----------+-----------+-------+ MCA           58.00                 1.10            +----------+-------------+----------+-----------+-------+ ACA          -32.00                 0.88            +----------+-------------+----------+-----------+-------+ Term ICA      43.00                 1.01            +----------+-------------+----------+-----------+-------+ PCA           35.00                 0.60             +----------+-------------+----------+-----------+-------+ Opthalmic     11.00                 2.08            +----------+-------------+----------+-----------+-------+ ICA siphon    46.00                 1.27            +----------+-------------+----------+-----------+-------+ Vertebral    -28.00                 0.99            +----------+-------------+----------+-----------+-------+  +----------+------------+----------+-----------+------------------+ LEFT TCD  Left VM (cm)Depth (cm)Pulsatility     Comment       +----------+------------+----------+-----------+------------------+ MCA          20.00                 0.79                       +----------+------------+----------+-----------+------------------+ ACA          -23.00                0.61                       +----------+------------+----------+-----------+------------------+ Term ICA                                   unable to insonate +----------+------------+----------+-----------+------------------+ PCA                                        unable to insonate +----------+------------+----------+-----------+------------------+ Opthalmic    17.00                 1.17                       +----------+------------+----------+-----------+------------------+ ICA siphon   53.00                 1.18                       +----------+------------+----------+-----------+------------------+  Vertebral    -24.00                1.18                       +----------+------------+----------+-----------+------------------+  +------------+-------+-------+             VM cm/sComment +------------+-------+-------+ Dist Basilar-20.00         +------------+-------+-------+ Summary:  Poor left temporal window limits evaluation of anetrior circulation on left otherwise normal mean flow velocities in majority of identified vessels of anterior and posterior cerebral circulation. *See table(s) above for  TCD measurements and observations.  Diagnosing physician: Delia Heady MD Electronically signed by Delia Heady MD on 01/25/2021 at 11:58:40 AM.    Final    US Abdomen Limited RUQ (LIVER/GB)  Result Date: 01/27/2021 CLINICAL DATA:  Hyperbilirubinemia. EXAM: ULTRASOUND ABDOMEN LIMITED RIGHT UPPER QUADRANT COMPARISON:  None. FINDINGS: Gallbladder: Gallbladder is surgically absent. Common bile duct: Diameter: 6.7 millimeter Liver: The liver parenchyma is normal in echogenicity. Numerous hepatic cysts are identified. In the LEFT hepatic lobe, cyst is 3.0 x 3.0 x 4.0 centimeters. A second cyst is 2 x 2 x 2 centimeters. In the RIGHT hepatic lobe, cyst is 4 x 3 x 3 centimeters. No solid or suspicious liver masses. Portal vein is patent on color Doppler imaging with normal direction of blood flow towards the liver. Other: None. IMPRESSION: 1. Multiple hepatic cysts. 2. Cholecystectomy. 3. No biliary duct dilatation identified. Electronically Signed   By: Norva Pavlov M.D.   On: 01/27/2021 13:45     PHYSICAL EXAM  Temp:  [96.6 F (35.9 C)-98.2 F (36.8 C)] 96.6 F (35.9 C) (05/15 0800) Pulse Rate:  [42-82] 55 (05/15 0734) Resp:  [17-22] 20 (05/15 0734) BP: (71-126)/(61-100) 82/66 (05/15 0734) SpO2:  [89 %-99 %] 89 % (05/15 0734) FiO2 (%):  [30 %] 30 % (05/15 0734) Weight:  [63 kg] 63 kg (05/15 0400)  General - Well nourished, well developed, intubated on precedex.  Ophthalmologic - fundi not visualized due to noncooperation.  Cardiovascular - Regular rate and rhythm.  Neuro - intubated on precedex, eyes widely open, following most simple commands both midline and peripheral. Eyes in mid position, tracking bilaterally, but not blinking to visual threat on the left, inconsistently blinking to visual threat on the right, PERRL. Corneal reflex present, gag and cough present. Breathing over the vent.  Facial symmetry not able to test due to ET tube.  Tongue protrusion not cooperative. BUE on  restrain, finger grip equally bilaterally, BLEs 4/5 proximal and 3/5 distally, equal bilaterally. No babinski. Sensation, coordination not cooperative and gait not tested.   ASSESSMENT/PLAN Adam Mcclain a 70 year old malewith a PMHx ofCVA, HTN, atrialfibrillation on aspirin and Plavix, COPD, and CKDpresenting with GTC seizures found to have a possible subcentimeter acute right parietal cortical infarct.   Stroke - several punctate acute right parietal cortical infarct, etiology uncertain, AF not AC vs. Right CCA high grade stenosis  CT:  No evidence of acute intracranial abnormality.  MRI  Possible subcentimeter acute right parietal cortical infarcts along the margin of a chronic parieto-occipital infarct. Large chronic left PCA infarct and multiple chronic lacunar infarcts.   TCD unremarkable  Carotid Doppler:  right CCA 80-99%.  Left ICA 60 to 79% stenosis.  Right ABI severely dampened waveforms.  Left subclavian artery low resistant, post stenotic waveforms  2D Echo EF 60 to 65%  VTE prophylaxis - heparin 5000 units ppx  No antithrombotics PTA, was on DAPT, currently Plavix on hold in preparation for tracheostomy.  Continue aspirin 325.  Therapy recommendations: Pending  Disposition:   Pending  A fib not on AC  A fib history not on anticoagulant but on DAPT prior to admission.   Sinus rhythm currently  Need to consider AC when no further procedure planned.  Seizure  No clare history of seizures.  cEEG with Triphasics 2-3 Hz on ictal-interictal spectrum but no seizures. Also notable for moderate to severe diffuse encephalopathy.   cEEG was disontinued after LTM for more than 48 hours with no seizures.  Continue Keppra 1000mg  BID IV->PO via cortrak  Carotid stenosis  history of right CEA 2017  Carotid Doppler:  right CCA 80-99%.  Left ICA 60 to 79% stenosis.  Right ABI severely dampened waveforms.  Left subclavian artery low resistant, post stenotic  waveforms  CTA head and neck pending  Will consider revascularization according to CTA head and neck  Hypertension  Home meds:  Norvasc 5, Lasix 20mg , Zestril 20mg    Currently on Levophed  Avoid low BP  Long-term BP goal 130-150 given severe carotid stenosis  Acute hypoxic resp failure  Aspiration PNA  Management per CCM, appreciated  Self -extubated 5/13 -> reintubated  +Moraxella catarrhalis on resp culture.   CCM planning IV Unasyn x 7 days.   Still has copious thick secretions  Tracheostomy planned next week after Plavix washout  Hyperlipidemia  Home meds:  Lipitor 40mg    LDL 43, goal < 70  LFT elevated, AST/ALT 56/54-> 65/93-> pending  Lipitor resumed  Continue statin at discharge  Dysphagia  NPO  Speech on board  On TF @55cc /hr  Diabetes type II Controlled  HgbA1c 5.3, goal < 7.0  CBGs  SSI  Close outpatient PCP follow-up  Other Stroke Risk Factors  Advanced Age >/= 33   History of stroke Large chronic left PCA infarct and multiple chronic lacunar infarcts.  Other Active Problems  Hypokalemia, K3.1->3.6->3.9->3.3 supplement  Leukocytosis WBC 7.9->14.6->9.2  Hospital day # 9  This patient is critically ill due to right MCA infarct, b/l carotid stenosis, seizure, respiratory failure, afib not on AC, elevated LFT and at significant risk of neurological worsening, death form recurrent stroke, status epilepticus, heart failure, respiratory failure, sepsis. This patient's care requires constant monitoring of vital signs, hemodynamics, respiratory and cardiac monitoring, review of multiple databases, neurological assessment, discussion with family, other specialists and medical decision making of high complexity. I spent 35 minutes of neurocritical care time in the care of this patient. I discussed with Dr. Chestine Spore CCM.   Marvel Plan, MD PhD Stroke Neurology 01/28/2021 9:36 AM    To contact Stroke Continuity provider, please refer  to [email or web address]. After hours, contact General Neurology

## 2021-01-28 NOTE — Progress Notes (Addendum)
Patient 0000 CBG 69. Gave 12.5 g of D50. Will recheck CBG.   CBG 67. Badi (charge RN) gave patient another 12.5 g of D50.   CBG 115 :)

## 2021-01-28 NOTE — Progress Notes (Signed)
Called CT and scheduled head scan for 2230.

## 2021-01-28 NOTE — Progress Notes (Signed)
[facility] ADULT ICU REPLACEMENT PROTOCOL   The patient does apply for the [facility] Adult ICU Electrolyte Replacment Protocol based on the criteria listed below:   1. Is GFR >/= 30 ml/min? Yes.    Patient's GFR today is >60 2. Is SCr </= 2? Yes.   Patient's SCr is 1.16 ml/kg/hr 3. Did SCr increase >/= 0.5 in 24 hours? No. 4. Abnormal electrolyte(s): K+ 3.3 5. Ordered repletion with: protocol 6. If a panic level lab has been reported, has the CCM MD in charge been notified? Yes.  .   Physician:  Dr. Antonietta Jewel, Lilia Argue 01/28/2021 3:43 AM

## 2021-01-29 ENCOUNTER — Other Ambulatory Visit: Payer: Self-pay

## 2021-01-29 ENCOUNTER — Inpatient Hospital Stay (HOSPITAL_COMMUNITY): Payer: Medicare Other

## 2021-01-29 DIAGNOSIS — I6521 Occlusion and stenosis of right carotid artery: Secondary | ICD-10-CM

## 2021-01-29 DIAGNOSIS — G458 Other transient cerebral ischemic attacks and related syndromes: Secondary | ICD-10-CM

## 2021-01-29 DIAGNOSIS — I6330 Cerebral infarction due to thrombosis of unspecified cerebral artery: Secondary | ICD-10-CM | POA: Diagnosis not present

## 2021-01-29 DIAGNOSIS — R569 Unspecified convulsions: Secondary | ICD-10-CM | POA: Diagnosis not present

## 2021-01-29 DIAGNOSIS — Z789 Other specified health status: Secondary | ICD-10-CM | POA: Diagnosis not present

## 2021-01-29 DIAGNOSIS — I6523 Occlusion and stenosis of bilateral carotid arteries: Secondary | ICD-10-CM | POA: Diagnosis not present

## 2021-01-29 LAB — CBC
HCT: 37.8 % — ABNORMAL LOW (ref 39.0–52.0)
Hemoglobin: 12.2 g/dL — ABNORMAL LOW (ref 13.0–17.0)
MCH: 31.6 pg (ref 26.0–34.0)
MCHC: 32.3 g/dL (ref 30.0–36.0)
MCV: 97.9 fL (ref 80.0–100.0)
Platelets: 239 10*3/uL (ref 150–400)
RBC: 3.86 MIL/uL — ABNORMAL LOW (ref 4.22–5.81)
RDW: 13.8 % (ref 11.5–15.5)
WBC: 10.5 10*3/uL (ref 4.0–10.5)
nRBC: 0.2 % (ref 0.0–0.2)

## 2021-01-29 LAB — HEPATIC FUNCTION PANEL
ALT: 66 U/L — ABNORMAL HIGH (ref 0–44)
AST: 38 U/L (ref 15–41)
Albumin: 2.1 g/dL — ABNORMAL LOW (ref 3.5–5.0)
Alkaline Phosphatase: 91 U/L (ref 38–126)
Bilirubin, Direct: 0.2 mg/dL (ref 0.0–0.2)
Indirect Bilirubin: 0.6 mg/dL (ref 0.3–0.9)
Total Bilirubin: 0.8 mg/dL (ref 0.3–1.2)
Total Protein: 5.5 g/dL — ABNORMAL LOW (ref 6.5–8.1)

## 2021-01-29 LAB — GLUCOSE, CAPILLARY
Glucose-Capillary: 103 mg/dL — ABNORMAL HIGH (ref 70–99)
Glucose-Capillary: 115 mg/dL — ABNORMAL HIGH (ref 70–99)
Glucose-Capillary: 124 mg/dL — ABNORMAL HIGH (ref 70–99)
Glucose-Capillary: 134 mg/dL — ABNORMAL HIGH (ref 70–99)
Glucose-Capillary: 140 mg/dL — ABNORMAL HIGH (ref 70–99)
Glucose-Capillary: 67 mg/dL — ABNORMAL LOW (ref 70–99)
Glucose-Capillary: 81 mg/dL (ref 70–99)
Glucose-Capillary: 86 mg/dL (ref 70–99)

## 2021-01-29 LAB — BASIC METABOLIC PANEL
Anion gap: 9 (ref 5–15)
BUN: 28 mg/dL — ABNORMAL HIGH (ref 8–23)
CO2: 23 mmol/L (ref 22–32)
Calcium: 8.1 mg/dL — ABNORMAL LOW (ref 8.9–10.3)
Chloride: 110 mmol/L (ref 98–111)
Creatinine, Ser: 1.29 mg/dL — ABNORMAL HIGH (ref 0.61–1.24)
GFR, Estimated: 59 mL/min — ABNORMAL LOW (ref >60)
Glucose, Bld: 132 mg/dL — ABNORMAL HIGH (ref 70–99)
Potassium: 4.7 mmol/L (ref 3.5–5.1)
Sodium: 142 mmol/L (ref 135–145)

## 2021-01-29 MED ORDER — MIDAZOLAM HCL 2 MG/2ML IJ SOLN
4.0000 mg | Freq: Once | INTRAMUSCULAR | Status: AC
  Administered 2021-01-29: 4 mg via INTRAVENOUS
  Filled 2021-01-29: qty 4

## 2021-01-29 MED ORDER — LACTATED RINGERS IV BOLUS
1000.0000 mL | Freq: Once | INTRAVENOUS | Status: AC
  Administered 2021-01-29: 1000 mL via INTRAVENOUS

## 2021-01-29 MED ORDER — LIDOCAINE HCL (PF) 2 % IJ SOLN
0.0000 mL | Freq: Once | INTRAMUSCULAR | Status: DC | PRN
  Filled 2021-01-29: qty 20

## 2021-01-29 MED ORDER — VECURONIUM BROMIDE 10 MG IV SOLR
10.0000 mg | Freq: Once | INTRAVENOUS | Status: AC
  Administered 2021-01-29: 10 mg via INTRAVENOUS
  Filled 2021-01-29: qty 10

## 2021-01-29 MED ORDER — ETOMIDATE 2 MG/ML IV SOLN
20.0000 mg | Freq: Once | INTRAVENOUS | Status: AC
  Administered 2021-01-29: 20 mg via INTRAVENOUS
  Filled 2021-01-29: qty 10

## 2021-01-29 MED ORDER — DEXTROSE 50 % IV SOLN
12.5000 g | Freq: Once | INTRAVENOUS | Status: AC
  Administered 2021-01-29: 12.5 g via INTRAVENOUS

## 2021-01-29 MED ORDER — FENTANYL CITRATE (PF) 100 MCG/2ML IJ SOLN
200.0000 ug | Freq: Once | INTRAMUSCULAR | Status: AC
  Administered 2021-01-29: 200 ug via INTRAVENOUS
  Filled 2021-01-29: qty 4

## 2021-01-29 NOTE — Procedures (Signed)
Percutaneous Tracheostomy Procedure Note   Adam Mcclain  [id number]  18-Dec-1950  Date:01/29/21  Time:2:26 PM   Provider Performing:Dvon Jiles  Procedure: Percutaneous Tracheostomy with Bronchoscopic Guidance (58099)  Indication(s) Acute respiratory failure  Consent Risks of the procedure as well as the alternatives and risks of each were explained to the patient and/or caregiver.  Consent for the procedure was obtained.  Anesthesia Etomidate, Versed, Fentanyl, Vecuronium   Time Out Verified patient identification, verified procedure, site/side was marked, verified correct patient position, special equipment/implants available, medications/allergies/relevant history reviewed, required imaging and test results available.   Sterile Technique Maximal sterile technique including sterile barrier drape, hand hygiene, sterile gown, sterile gloves, mask, hair covering.    Procedure Description Appropriate anatomy identified by palpation.  Patient's neck prepped and draped in sterile fashion.  1% lidocaine with epinephrine was used to anesthetize skin overlying neck.  1.5cm incision made and blunt dissection performed until tracheal rings could be easily palpated.   Then a size 6 Shiley tracheostomy was placed under bronchoscopic visualization using usual Seldinger technique and serial dilation.   Bronchoscope confirmed placement above the carina.  Tracheostomy was sutured in place with adhesive pad to protect skin under pressure.    Patient connected to ventilator.   Complications/Tolerance None; patient tolerated the procedure well. Chest X-ray is ordered to confirm no post-procedural complication.   EBL Minimal   Specimen(s) None

## 2021-01-29 NOTE — Consult Note (Addendum)
Hospital Consult    Reason for Consult: Right carotid stenosis/ left subclavian steal Requesting Physician:  Dr. Roda Shutters MRN #:  [id number]  History of Present Illness: This is a 70 y.o. male with significant past medical history including prior CVA, hypertension, atrial fibrillation on Aspirin and plavix, hypertriglyceridemia, COPD, and CKD who presented to [facility] ED via EMS with seizure like activity and unresponsive. He was intubated in route. On admission he was found to have possible right parietal infarct, acute hypoxic respiratory failure with aspiration pneumonia with failed extubation 3x. CT of head showed no acute abnormalities. EEG was performed and showed no seizure activity. MRI showing chronic right parieto- occipital infarct, left chronic PCA infarct, multiple chronic lacunar infarcts and new small right parietal infarct. CTA showing occlusion of left subclavian, 50% left ICA stenosis, 80 % right CCA stenosis, extensive intracranial disease. Carotid duplex also showing high grade right CCA stenosis of > 80% and left ICA stenosis of 50%. Most of medical history obtained from chart due to patient being intubated and no family at bedside  Vascular surgery has been consulted for evaluation of his right carotid stenosis and left subclavian occlusion  Past Medical History:  Diagnosis Date  . A-fib (HCC)   . GERD (gastroesophageal reflux disease)   . HTN (hypertension)   . Nephrolithiasis   . PAD (peripheral artery disease) (HCC)   . Stroke [facility])     Past Surgical History:  Procedure Laterality Date  . AORTOGRAM    . CATARACT EXTRACTION, BILATERAL    . CHOLECYSTECTOMY      No Known Allergies  Prior to Admission medications   Medication Sig Start Date End Date Taking? Authorizing Provider  ALPRAZolam Prudy Feeler) 0.5 MG tablet Take 0.5 mg by mouth 2 (two) times daily. 01/01/21   [provider]  amLODipine (NORVASC) 5 MG tablet Take 5 mg by mouth daily. 01/15/21   [provider]  atorvastatin (LIPITOR) 40 MG tablet Take 40 mg by mouth daily. for cholesterol 01/15/21   [provider]  azelastine (OPTIVAR) 0.05 % ophthalmic solution Place 1 drop into both eyes 2 (two) times daily. 11/23/20   [provider]  cephALEXin (KEFLEX) 500 MG capsule Take 500 mg by mouth 2 (two) times daily. For seven days 01/01/21   [provider]  citalopram (CELEXA) 20 MG tablet Take 20 mg by mouth daily. 08/23/20   [provider]  DULoxetine (CYMBALTA) 60 MG capsule Take 60 mg by mouth daily. 12/09/20   [provider]  famotidine (PEPCID) 20 MG tablet Take 20 mg by mouth 2 (two) times daily. 08/23/20   [provider]  furosemide (LASIX) 20 MG tablet Take 20 mg by mouth daily. 01/15/21   [provider]  lisinopril (ZESTRIL) 20 MG tablet Take 20 mg by mouth daily. 08/23/20   [provider]    Social History   Socioeconomic History  . Marital status: Widowed    Spouse name: Not on file  . Number of children: Not on file  . Years of education: Not on file  . Highest education level: Not on file  Occupational History  . Not on file  Tobacco Use  . Smoking status: Current Every Day Smoker  . Smokeless tobacco: Never Used  Substance and Sexual Activity  . Alcohol use: Not Currently  . Drug use: Not on file  . Sexual activity: Not on file  Other Topics Concern  . Not on file  Social History Narrative  .  Not on file   Social Determinants of Health   Financial Resource Strain: Not on file  Food Insecurity: Not on file  Transportation Needs: Not on file  Physical Activity: Not on file  Stress: Not on file  Social Connections: Not on file  Intimate Partner Violence: Not on file     Family History  Problem Relation Age of Onset  . Hypertension Father   . Diabetes Father   . Cancer Father     ROS: Otherwise negative unless mentioned in HPI  Physical Examination  Vitals:   01/29/21 0900  01/29/21 1000  BP: 108/64 131/68  Pulse: 63 72  Resp: 18 19  Temp:    SpO2: 98% 100%   There is no height or weight on file to calculate BMI.  General:  WDWN in NAD, intubated Gait: Not observed HENT: WNL, normocephalic Pulmonary: normal non-labored breathing, intubated Cardiac: regular Vascular Exam/Pulses: 2+ dp pulses bilaterally, feet warm and well perfused Musculoskeletal: no muscle wasting or atrophy  Neurologic: alert, able to follow simple commands and tracks appropriately. Grip strength equal bilaterally, moving extremities without any appreciable deficits  CBC    Component Value Date/Time   WBC 10.5 01/29/2021 0116   RBC 3.86 (L) 01/29/2021 0116   HGB 12.2 (L) 01/29/2021 0116   HCT 37.8 (L) 01/29/2021 0116   PLT 239 01/29/2021 0116   MCV 97.9 01/29/2021 0116   MCH 31.6 01/29/2021 0116   MCHC 32.3 01/29/2021 0116   RDW 13.8 01/29/2021 0116   LYMPHSABS 0.9 01/21/2021 0549   MONOABS 0.7 01/21/2021 0549   EOSABS 0.0 01/21/2021 0549   BASOSABS 0.0 01/21/2021 0549    BMET    Component Value Date/Time   NA 142 01/29/2021 0116   K 4.7 01/29/2021 0116   CL 110 01/29/2021 0116   CO2 23 01/29/2021 0116   GLUCOSE 132 (H) 01/29/2021 0116   BUN 28 (H) 01/29/2021 0116   CREATININE 1.29 (H) 01/29/2021 0116   CALCIUM 8.1 (L) 01/29/2021 0116   GFRNONAA 59 (L) 01/29/2021 0116    COAGS: No results found for: INR, PROTIME   Non-Invasive Vascular Imaging:   CTA Head and Neck: 01/28/21 IMPRESSION: CT HEAD IMPRESSION:  1. Stable head CT. Recently identified possible small parietal infarcts not visible by CT. No other new acute intracranial abnormality. 2. Underlying atrophy with chronic ischemic changes as above. 3. Right-sided paranasal sinus disease.  CTA HEAD AND NECK IMPRESSION:  1. 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 Left subclavian artery narrowed and  attenuated but patent distally. 2. 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 Visualized flow within the left vertebral artery may be retrograde in nature given the proximal left subclavian artery occlusion. 3. 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 4. 50% atheromatous stenosis at the left carotid bifurcation. 5. 50% proximal right subclavian artery stenosis. 6. Extensive atheromatous change throughout the carotid siphons with associated moderate multifocal stenosis. 7. Short-segment moderate to severe mid right M1 stenosis. 8. 2-3 mm focal outpouching extending medially from the cavernous left ICA, suspicious for a small aneurysm. 9. Small layering bilateral pleural effusions with underlying emphysema.  MRI Brain w/o contrast: 01/22/21 IMPRESSION: 1. Possible subcentimeter acute right parietal cortical infarct along the margin of a chronic parieto-occipital infarct. 2. Large chronic left PCA infarct. 3. Encephalomalacia in the anterior right temporal lobe. 4. Moderate  chronic small vessel ischemic disease with multiple chronic lacunar infarcts.  VAS US Carotid Duplex Bilateral : 01/23/21 Summary:  Right Carotid: Velocities in the right ICA are consistent with a 1-39% stenosis. Hemodynamically significant plaque 80-99% visualized in the CCA. The ECA appears >50% stenosed.  Left Carotid: Velocities in the left ICA are consistent with a 60-79% stenosis. Non-hemodynamically significant plaque <50% noted in the CCA. The ECA appears >50% stenosed.  Vertebrals: Left vertebral artery demonstrates antegrade flow. Right vertebral artery shows severely dampened waveforms.  Subclavians: Normal flow hemodynamics were seen in the right subclavian artery. Left subclavian artery shows low resistive, post stenotic waveforms.   Statin:  Yes.   Beta Blocker:  No. Aspirin:  Yes.   ACEI:  Yes.   ARB:  No. CCB use:   Yes Other antiplatelets/anticoagulants:  No.    ASSESSMENT/PLAN: This is a 70 y.o. male who presented with seizure like activity found to have possible acute right parietal stroke with right CCA stenosis of >80% on doppler and CTA. He presently has multiple medical issues going on including failed extubation x3, pending tracheostomy today, aspiration pneumonia, extensive intracranial disease with prior infarcts, and atrial fibrillation not on anticoagulation so it is hard to say what is the source of possible right acute parietal infarct. Appears neurologically intact but hard to assess or know patients baseline. No indication for intervention for occluded left subclavian at this time. - Recommend close follow up but would not recommend any urgent surgical intervention until medically more stable -Continue Aspirin and statin. Restart Plavix when safe after tracheostomy - Continue adequate blood pressure control. On Norvasc, Lasix and Zestril - Patient was seen with Dr. Chestine Spore, on call vascular surgeon, who will provider further management recommendations  Dory Horn [facility] [phone] 01/29/2021  10:47 AM   I have seen and evaluated the patient. I agree with the PA note as documented above.  70 year old male admitted with questionable generalized seizure and then found to have acute right parietal cortical infarcts now intubated in the ICU.  Vascular surgery was consulted for 80% right common carotid stenosis and also left subclavian occlusion with question of left subclavian steal.  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  I have reviewed the CTA and on the side of his stroke he has no significant carotid bifurcation disease in the ICA and agree he has about an 80% mid common carotid stenosis.  I feel the etiology for common carotid lesions is less well  understood and he has multiple other underlying risk factors for his stroke including A. fib not on anticoagulation as well as significant intracranial disease.  I would not plan any urgent surgical intervention at this time.  I agree with neurology plan likely would recommend anticoagulation when appropriate.  He is also being treated for aspiration pneumonia with plan for trach today after 3 failed extubations.  In regards to his left subclavian occlusion hard to know that he has any left arm symptoms or posterior basilar symptoms until he is more awake and we can have a conversation with him.  Many subclavian artery occlusions are asymptomatic and do not require intervention.  Vascular surgery will continue to follow.  Cephus Shelling, MD [facility] [place] Office: [phone]

## 2021-01-29 NOTE — Progress Notes (Signed)
OT Cancellation Note  Patient Details Name: Adam Mcclain MRN: [id number] DOB: 1950-09-20   Cancelled Treatment:    Reason Eval/Treat Not Completed: Patient at procedure or test/ unavailable Staff setup for a trach with sterile environment at this time. OT to attempt at later time when more appropriate.  Wynona Neat, OTR/L  Acute Rehabilitation Services Pager: [phone] Office: [phone] .  01/29/2021, 3:37 PM

## 2021-01-29 NOTE — Procedures (Signed)
Diagnostic Bronchoscopy  Adam Mcclain  [id number]  19-Jul-1951  Date:01/29/21  Time:2:27 PM   Provider Performing:Sinthia Karabin C Gizel Riedlinger   Procedure: Diagnostic Bronchoscopy (67209)  Indication(s) Assist with direct visualization of tracheostomy placement  Consent Risks of the procedure as well as the alternatives and risks of each were explained to the patient and/or caregiver.  Consent for the procedure was obtained.   Anesthesia See separate tracheostomy note   Time Out Verified patient identification, verified procedure, site/side was marked, verified correct patient position, special equipment/implants available, medications/allergies/relevant history reviewed, required imaging and test results available.   Sterile Technique Usual hand hygiene, masks, gowns, and gloves were used   Procedure Description Bronchoscope advanced through endotracheal tube and into airway.  After suctioning out tracheal secretions, bronchoscope used to provide direct visualization of tracheostomy placement.   Complications/Tolerance None; patient tolerated the procedure well.   EBL None  Specimen(s) None

## 2021-01-29 NOTE — Progress Notes (Signed)
NAME:  Adam Mcclain, MRN:  [id number], DOB:  [birth year], LOS: 10 ADMISSION DATE:  01/19/2021, CONSULTATION DATE: 5/6 REFERRING MD: Dr. Rubin Payor, CHIEF COMPLAINT: Seizure  History of Present Illness:  70 year old male with past medical history as below, which is significant for CVA, HTN, afibrillation on aspirin and Plavix, COPD, and CKD.  In the midday hours of 5/6 he had accompanied by family member to a doctor's appointment.  He was waiting in the car when a bystander walked by and noticed he had a gaze deviation and was shaking.  EMS was called.  Upon EMS arrival the patient was unresponsive and was administered 10 mg of Versed.  He was bagged in route to the emergency department where he was promptly intubated on presentation.  The ED physician did note some rigidity in the upper extremities concerning for seizure activity.  CT of the head was nonacute.  The patient was evaluated by neurology who provided a loading dose of Keppra and started EEG monitoring.  PCCM was asked to admit the patient due to the requirement of mechanical ventilation.  Significant Hospital Events: Including procedures, antibiotic start and stop dates in addition to other pertinent events   . 5/6 admit to [facility] for seizures on the ventilator. . 5/7 EEG shows no seizure activity . 5/8 propofol was turned off, patient is spiked fever but white count trended down . 5/9 EEG was negative for seizures, MRI was done showed very small right parietal stroke . 5/10 trial of extubation, patient is struggled had to be reintubated . 5/11 MRI brain showed subcentimeter new stroke . 5/12 tolerated pressure support, extubated and had to be reintubated because of respiratory distress . 5/13 patient was a scheduled for tracheostomy but he self extubated early this morning at shift change . 5/14 reintubated overnight for uncontrolled WOB and inability to manage secretions. Plavix stopped in prep for trach. . Became bradycardic while  on Precedex, dose was decreased and he was started on Versed  Interim History / Subjective:  Overnight patient went for CT head and CTA head and neck.  Tolerated procedure well  Objective   Blood pressure (!) 152/73, pulse 66, temperature 99.6 F (37.6 C), temperature source Oral, resp. rate (!) 23, weight 60.7 kg, SpO2 99 %.    Vent Mode: PRVC FiO2 (%):  [30 %] 30 % Set Rate:  [18 bmp] 18 bmp Vt Set:  [580 mL] 580 mL PEEP:  [5 cmH20] 5 cmH20 Plateau Pressure:  [17 cmH20-27 cmH20] 17 cmH20   Intake/Output Summary (Last 24 hours) at 01/29/2021 0811 Last data filed at 01/29/2021 0600 Gross per 24 hour  Intake 1818.6 ml  Output 1280 ml  Net 538.6 ml   Filed Weights   01/27/21 0400 01/28/21 0400 01/29/21 0400  Weight: 58.9 kg 63 kg 60.7 kg    Examination: General: critically il appearing man lying in bed, orally intubated HENT: [place]/AT, eyes anicteric, moist mucous membranes Lungs: rhonchi, thick secretions from ETT but clear Cardiovascular: S1S2, RRR Abdomen: soft, NT Extremities: no c/c/e Neuro: RASS 0, moving all extremities on command against gravity. Skin: no rashes, warm & dry  Labs/imaging that I have personally reviewed  (right click and "Reselect all SmartList Selections" daily)  Na 142 K 4.7 Cl 110 Cr 1.29 WBC 10.5 Hgb 12.2  5/9 Respiratory culture: Grew Moraxella catarrhalis, B- lactamase +  RUQ [place] 5/14> no cystic duct dilation, prev cholecystectomy. Multiple hepatic cysts  5/15: CTA neck Severe 80% right CCA stenosis. 4.  50% atheromatous stenosis at the left carotid bifurcation. 5. 50% proximal right subclavian artery stenosis.  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 Atorvastatin daily. Con't to hold wellbutrin, probably should not be restarted due to seizure risk  Acute hypoxemic respiratory failure in the setting of seizure and aspiration pneumonia with Moraxella catarrhalis. Failed extubated x 3 now. Patient's brother is on board with plan for tracheostomy after after plavix washout Continue lung protective ventilation VAP prevention protocol PAD protocol for sedation; goal RASS 0 to -1. Adding seroquel to help with agitation, continue Precedex/midazolam Continue IV Unasyn for 7 days (day #7)  Bilateral carotid stenosis Carotid vascular ultrasound showed severe right common carotid artery stenosis 80%, moderate to moderate stenosis on the left common carotid 50% Once stable, he will need IR guided ICA stent aspirin ok, but plavix on hold due to need for trach; d/w neurology  Paroxysmal atrial fibrillation: Not on AC HTN Holding home antihypertensives including amlodipine, HCTZ and lisinopril  Hypertriglyceridemia -Continue atorvastatin -Avoid Cleviprex & propofol   COPD history documented. No associated home meds listed. No evidence of acute exacerbation.  -PRN Duonebs; if needing frequently can add long-acting BDs  Best practice (right click and "Reselect all SmartList Selections" daily)  Diet:  Tube Feed .  Pain/Anxiety/Delirium protocol (if indicated): yes VAP protocol (if indicated): yes DVT prophylaxis: Subcutaneous Heparin GI prophylaxis: PPI Glucose control:  SSI Yes Central venous access:  N/A Arterial line:  N/A Foley:  N/A Mobility: Out of bed to chair PT consulted: Yes Last date of multidisciplinary goals of care discussion [5/14: trach planned after plavix washout; d/w brother [name] at bedside with RN present  Code Status:  full code Disposition: ICU  Total critical care time: 32 minutes  Performed by: Cheri Fowler   Critical care time was  exclusive of separately billable procedures and treating other patients.   Critical care was necessary to treat or prevent imminent or life-threatening deterioration.   Critical care was time spent personally by me on the following activities: development of treatment plan with patient and/or surrogate as well as nursing, discussions with consultants, evaluation of patient's response to treatment, examination of patient, obtaining history from patient or surrogate, ordering and performing treatments and interventions, ordering and review of laboratory studies, ordering and review of radiographic studies, pulse oximetry and re-evaluation of patient's condition.   Cheri Fowler MD [facility] See Amion for pager If no response to pager, please call [phone] until 7pm After 7pm, Please call E-link [phone]

## 2021-01-29 NOTE — Procedures (Signed)
Bedside Tracheostomy Insertion Procedure Note   Patient Details:   Name: Adam Mcclain DOB: 02/28/1951 MRN: [id number]  Procedure: Tracheostomy  Pre Procedure Assessment: ET Tube Size: ET Tube secured at lip (cm): Bite block in place: No Breath Sounds: Clear and Diminished  Post Procedure Assessment: BP (!) 84/59   Pulse 67   Temp 98.1 F (36.7 C) (Axillary)   Resp 18   Wt 60.7 kg   SpO2 100%  O2 sats: stable throughout Complications: No apparent complications Patient did tolerate procedure well Tracheostomy Brand:Shiley Tracheostomy Style:Cuffed Tracheostomy Size: 6 Tracheostomy Secured DHW:[id number] Tracheostomy Placement Confirmation:Trach cuff visualized and in place    Suszanne Conners 01/29/2021, 2:32 PM

## 2021-01-29 NOTE — Progress Notes (Signed)
STROKE TEAM PROGRESS NOTE   SUBJECTIVE (INTERVAL HISTORY) RNs are at the bedside.  Pt still intubated on precedex, but awake alert following commands. Pending trach this week. CTA head and neck showed right CCA 80% stenosis, will have vascular surgery consult.    OBJECTIVE Temp:  [97.9 F (36.6 C)-99.6 F (37.6 C)] 97.9 F (36.6 C) (05/16 0800) Pulse Rate:  [60-79] 66 (05/16 0808) Cardiac Rhythm: Normal sinus rhythm (05/16 0800) Resp:  [18-25] 23 (05/16 0808) BP: (85-152)/(59-73) 152/73 (05/16 0808) SpO2:  [93 %-100 %] 99 % (05/16 0808) FiO2 (%):  [30 %] 30 % (05/16 0808) Weight:  [60.7 kg] 60.7 kg (05/16 0400)  Recent Labs  Lab 01/28/21 2344 01/29/21 0032 01/29/21 0055 01/29/21 0401 01/29/21 0808  GLUCAP 69* 67* 115* 134* 124*   Recent Labs  Lab 01/23/21 0433 01/25/21 0414 01/25/21 1621 01/27/21 0607 01/27/21 0617 01/28/21 0042 01/29/21 0116  NA 141 143 143 141 143 141 142  K 3.9 3.1* 3.6 4.8 3.9 3.3* 4.7  CL 109 114*  --  107  --  109 110  CO2 24 24  --  21*  --  25 23  GLUCOSE 157* 126*  --  174*  --  200* 132*  BUN 22 31*  --  20  --  19 28*  CREATININE 1.28* 1.19  --  1.23  --  1.16 1.29*  CALCIUM 8.5* 8.2*  --  8.2*  --  7.9* 8.1*  MG  --  1.9  --   --   --  2.3  --   PHOS 2.6 2.3*  --   --   --   --   --    Recent Labs  Lab 01/25/21 0414 01/27/21 0607 01/29/21 0116  AST 56* 65* 38  ALT 54* 93* 66*  ALKPHOS 84 93 91  BILITOT 0.3 1.9* 0.8  PROT 5.4* 6.1* 5.5*  ALBUMIN 1.9* 2.2* 2.1*   Recent Labs  Lab 01/25/21 0414 01/25/21 1621 01/27/21 0607 01/27/21 0617 01/28/21 0042 01/29/21 0116  WBC 7.9  --  14.6*  --  9.2 10.5  HGB 11.2* 10.9* 12.9* 11.9* 11.2* 12.2*  HCT 34.5* 32.0* 38.9* 35.0* 33.1* 37.8*  MCV 96.6  --  96.3  --  95.1 97.9  PLT 130*  --  300  --  248 239   No results for input(s): CKTOTAL, CKMB, CKMBINDEX, TROPONINI in the last 168 hours. No results for input(s): LABPROT, INR in the last 72 hours. No results for input(s):  COLORURINE, LABSPEC, PHURINE, GLUCOSEU, HGBUR, BILIRUBINUR, KETONESUR, PROTEINUR, UROBILINOGEN, NITRITE, LEUKOCYTESUR in the last 72 hours.  Invalid input(s): APPERANCEUR     Component Value Date/Time   CHOL 118 01/23/2021 1215   TRIG 309 (H) 01/26/2021 0436   HDL 15 (L) 01/23/2021 1215   CHOLHDL 7.9 01/23/2021 1215   VLDL 60 (H) 01/23/2021 1215   LDLCALC 43 01/23/2021 1215   Lab Results  Component Value Date   HGBA1C 5.3 01/23/2021      Component Value Date/Time   LABOPIA NONE DETECTED 01/19/2021 1237   COCAINSCRNUR NONE DETECTED 01/19/2021 1237   LABBENZ POSITIVE (A) 01/19/2021 1237   AMPHETMU NONE DETECTED 01/19/2021 1237   THCU NONE DETECTED 01/19/2021 1237   LABBARB NONE DETECTED 01/19/2021 1237    No results for input(s): ETH in the last 168 hours.  I have personally reviewed the radiological images below and agree with the radiology interpretations.  CT ANGIO HEAD NECK W WO CM  Result Date:  01/29/2021 CLINICAL DATA:  Follow-up examination for stroke. EXAM: CT ANGIOGRAPHY HEAD AND NECK TECHNIQUE: Multidetector CT imaging of the head and neck was performed using the standard protocol during bolus administration of intravenous contrast. Multiplanar CT image reconstructions and MIPs were obtained to evaluate the vascular anatomy. Carotid stenosis measurements (when applicable) are obtained utilizing NASCET criteria, using the distal internal carotid diameter as the denominator. CONTRAST:  50mL OMNIPAQUE IOHEXOL 350 MG/ML SOLN COMPARISON:  Previous MRI from 01/22/2021. FINDINGS: CT HEAD FINDINGS Brain: Age-related cerebral atrophy with chronic small vessel ischemic disease. Chronic right parieto-occipital infarct again noted. Additional chronic left PCA distribution infarct noted as well. Encephalomalacia at the anterior temporal pole on the right likely related to remote trauma. Recently identified small right parietal cortical infarcts not well visualized by CT. No intracranial  hemorrhage or mass effect. No other new large vessel territory infarct. No mass effect or midline shift. No hydrocephalus or extra-axial fluid collection. Vascular: No hyperdense vessel. Calcified atherosclerosis at the skull base. Skull: Scalp soft tissues demonstrate no acute finding. Calvarium intact. Sinuses: Right frontoethmoidal and maxillary sinusitis. Nasogastric tube in place. Right greater than left bilateral mastoid effusions. Orbits: Globes and orbital soft tissues demonstrate no acute finding. Review of the MIP images confirms the above findings CTA NECK FINDINGS Aortic arch: Visualized aortic arch normal in caliber with normal 3 vessel morphology. Moderate atheromatous change about the arch itself. No significant stenosis about the innominate or left common carotid artery. The left subclavian artery occludes just distal to its origin. Distal reconstitution near the level of the left vertebral artery. Narrowed and attenuated flow seen distally within the left subclavian and axillary arteries (series 8, image 278). Additional 50% stenosis noted at the proximal right subclavian artery (series 8, image 312). Right subclavian artery irregular but otherwise patent. Right carotid system: Extensive atheromatous irregularity throughout the right CCA. Short-segment severe at least 80% stenosis noted involving the mid right CCA (series 8, image 262). Right common and internal carotid arteries irregular but otherwise patent to the skull base without stenosis, dissection or occlusion. Left carotid system: Diffuse atheromatous irregularity seen throughout the left CCA without high-grade stenosis. Concentric predominantly soft plaque about the left carotid bulb and proximal left ICA. Associated stenosis of up to approximately 50% by NASCET criteria. Left ICA patent distally without additional stenosis, dissection or occlusion. Note made of an additional approximate 50% stenosis at the proximal left external carotid  artery Vertebral arteries: Both vertebral arteries appear to arise from the subclavian arteries, and are occluded at their origins. On the left, there is distal reconstitution at the proximal V2 segment (series 8, image 254). Markedly narrowed and irregular flow seen distally within the proximal and mid left V2 segment. The left vertebral artery is more normal in caliber and patent at the V3 segment. Left vertebral artery widely patent as it courses into the cranial vault. Flow within the left vertebral artery may be retrograde in nature given the proximal left subclavian occlusion. On the right, there is irregular distal reconstitution at the distal right V2 segment the a muscular branch (series 8, image 195). Right vertebral artery patent distally to the skull base. Skeleton: No visible acute osseous finding. No worrisome osseous lesions. 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 Patient is largely edentulous. Other neck: Endotracheal and enteric tubes in place. No other acute soft tissue abnormality within the neck. No mass or adenopathy. 5 mm left thyroid nodule noted, felt to be of doubtful significance given  size and patient age, no follow-up imaging recommended (ref: J Am Coll Radiol. 2015 Feb;12(2): 143-50). Upper chest: Small layering bilateral pleural effusions partially visualized. Underlying emphysematous changes. Visualized upper chest demonstrates no other acute finding. Review of the MIP images confirms the above findings CTA HEAD FINDINGS Anterior circulation: Extensive atheromatous irregularity throughout the petrous, cavernous, and supraclinoid ICAs with associated moderate multifocal stenosis. Subtle 2-3 mm focal outpouching extending medially from the cavernous left ICA suspicious for a small aneurysm (series 9, image 124). A1 segments patent bilaterally. Normal anterior communicating artery complex. Anterior cerebral arteries patent to their distal aspects without high-grade  stenosis. Left M1 segment patent without high-grade stenosis. Short-segment moderate to severe right M1 stenosis (series 7, image 20). Negative MCA bifurcations. No proximal MCA branch occlusion. Distal MCA branches demonstrate diffuse atheromatous irregularity. Posterior circulation: Dominant left vertebral artery patent to the vertebrobasilar junction. Left PICA origin patent. Hypoplastic right vertebral artery largely terminates in PICA. Moderate proximal basilar artery stenosis (series 8, image 128). Basilar otherwise patent distally. Superior cerebellar arteries patent bilaterally. Both PCAs primarily supplied via the basilar. Right PCA well perfused to its distal aspect. Left PCA attenuated distally, in keeping with the chronic left PCA distribution infarct. Venous sinuses: Grossly patent allowing for timing the contrast bolus. Anatomic variants: Hypoplastic right vertebral artery terminates in PICA. Review of the MIP images confirms the above findings IMPRESSION: CT HEAD IMPRESSION: 1. Stable head CT. Recently identified possible small parietal infarcts not visible by CT. No other new acute intracranial abnormality. 2. Underlying atrophy with chronic ischemic changes as above. 3. Right-sided paranasal sinus disease. CTA HEAD AND NECK IMPRESSION: 1. 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 Left subclavian artery narrowed and attenuated but patent distally. 2. 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 Visualized flow within the left vertebral artery may be retrograde in nature given the proximal left subclavian artery occlusion. 3. 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 4. 50% atheromatous stenosis at the left carotid bifurcation. 5. 50% proximal right subclavian artery stenosis. 6. Extensive atheromatous change throughout the carotid  siphons with associated moderate multifocal stenosis. 7. Short-segment moderate to severe mid right M1 stenosis. 8. 2-3 mm focal outpouching extending medially from the cavernous left ICA, suspicious for a small aneurysm. 9. Small layering bilateral pleural effusions with underlying emphysema. Emphysema (ICD10-J43.9). Electronically Signed   By: Rise Mu M.D.   On: 01/29/2021 04:04   DG Chest 1 View  Result Date: 01/25/2021 CLINICAL DATA:  Intubation EXAM: CHEST  1 VIEW COMPARISON:  Portable exam 1553 hours compared to 01/23/2021 FINDINGS: Tip of endotracheal tube projects 2.9 cm above carina. Normal heart size, mediastinal contours, and pulmonary vascularity. Minimal atelectasis at lung bases. No definite infiltrate, pleural effusion or pneumothorax. IMPRESSION: Minimal bibasilar atelectasis. Electronically Signed   By: Ulyses Southward M.D.   On: 01/25/2021 17:12   DG Abd 1 View  Result Date: 01/25/2021 CLINICAL DATA:  Check gastric catheter placement EXAM: ABDOMEN - 1 VIEW COMPARISON:  None. FINDINGS: Gastric catheter is noted with the tip in the stomach. Proximal side port lies in the distal esophagus however. This should be advanced several cm deeper into the stomach. IMPRESSION: Gastric catheter as described which should be advanced further into the stomach. Electronically Signed   By: Alcide Clever M.D.   On: 01/25/2021 18:50   CT Head  Wo Contrast  Result Date: 01/19/2021 CLINICAL DATA:  Found down.  Possible seizure. EXAM: CT HEAD WITHOUT CONTRAST TECHNIQUE: Contiguous axial images were obtained from the base of the skull through the vertex without intravenous contrast. COMPARISON:  CT head December 30, 2020. FINDINGS: Brain: Similar encephalomalacia in the anterolateral right temporal lobe, and bilateral occipital lobes. Similar small remote lacunar infarct versus dilated perivascular space in the left basal ganglia. No evidence of acute large vascular territory infarct. Moderate patchy white  matter hypodensities, similar and likely related to chronic microvascular ischemic disease. No acute hemorrhage. Similar ex vacuo ventricular dilation without hydrocephalus. No visible extra-axial fluid collection. No mass lesion or abnormal mass effect. Vascular: No hyperdense vessel.  Calcific atherosclerosis. Skull: No acute fracture. Sinuses/Orbits: Sinuses are largely clear. No acute orbital abnormality. Other: No mastoid effusions. Debris in bilateral external auditory canals, presumably cerumen. Fluid layering in the pharynx, likely related to intubation. IMPRESSION: 1. No evidence of acute intracranial abnormality. 2. Similar encephalomalacia in the anterolateral right temporal lobe and bilateral occipital lobes. 3. Moderate chronic microvascular ischemic disease. Electronically Signed   By: Feliberto Harts MD   On: 01/19/2021 12:53   MR BRAIN WO CONTRAST  Result Date: 01/22/2021 CLINICAL DATA:  Seizure. EXAM: MRI HEAD WITHOUT CONTRAST TECHNIQUE: Multiplanar, multiecho pulse sequences of the brain and surrounding structures were obtained without intravenous contrast. COMPARISON:  Head CT 01/19/2021 and MRI 03/04/2019 FINDINGS: Brain: There is a moderate-sized chronic right parieto-occipital infarct with associated chronic blood products. Scattered subcentimeter foci of trace diffusion weighted signal hyperintensity in this region are largely felt to be secondary to susceptibility artifact from chronic blood products, however there may be a superimposed subcentimeter acute cortical infarct in the right parietal lobe along the anterosuperior aspect of the chronic encephalomalacia (series 3, image 36). No acute infarct is identified elsewhere. There is a large chronic left PCA infarct with chronic blood products. There is also encephalomalacia anteriorly in the right temporal lobe which may reflect an old infarct or be secondary to remote trauma. Patchy T2 hyperintensities in the cerebral white matter  bilaterally are nonspecific but compatible with moderate chronic small vessel ischemic disease. Chronic lacunar infarcts are noted in the deep white matter of the right cerebral hemisphere and in the left basal ganglia. There is mild to moderate cerebral atrophy. Scattered chronic microhemorrhages are noted in the cerebrum and cerebellum including in the deep gray nuclei and may reflect the sequelae of chronic hypertension. No mass, midline shift, or extra-axial fluid collection is evident. Vascular: Major intracranial vascular flow voids are preserved. Skull and upper cervical spine: Unremarkable bone marrow signal. Congenital C2-3 fusion. Sinuses/Orbits: Bilateral cataract extraction. Clear paranasal sinuses. Small to moderate bilateral mastoid effusions. Other: Fluid in the pharynx in the setting of intubation. IMPRESSION: 1. Possible subcentimeter acute right parietal cortical infarct along the margin of a chronic parieto-occipital infarct. 2. Large chronic left PCA infarct. 3. Encephalomalacia in the anterior right temporal lobe. 4. Moderate chronic small vessel ischemic disease with multiple chronic lacunar infarcts. Electronically Signed   By: Sebastian Ache M.D.   On: 01/22/2021 16:37   DG CHEST PORT 1 VIEW  Result Date: 01/27/2021 CLINICAL DATA:  Encounter for intubation of airway. EXAM: PORTABLE CHEST 1 VIEW COMPARISON:  01/25/2021 FINDINGS: ET tube tip is above the carina. There is a feeding tube with tip well below the GE junction in the projection of the right abdomen. Mild bibasilar opacities. No pleural effusions or interstitial edema. IMPRESSION: 1. ET tube tip  above the carina. 2. Mild bibasilar opacities compatible with atelectasis versus pneumonia. Electronically Signed   By: Signa Kell M.D.   On: 01/27/2021 05:57   DG CHEST PORT 1 VIEW  Result Date: 01/23/2021 CLINICAL DATA:  Hypoxia EXAM: PORTABLE CHEST 1 VIEW COMPARISON:  Jan 21, 2021 FINDINGS: Endotracheal tube tip is at the carina  directed toward the right main bronchus. Nasogastric tube tip and side port in stomach. No edema or airspace opacity. Apparent nipple shadow on the left, stable. Heart size and pulmonary vascularity are normal. No adenopathy. No bone lesions. IMPRESSION: Endotracheal tube tip at carina directed toward the right main bronchus. Advise withdrawing endotracheal tube approximately 3 cm. No edema or airspace opacity. These results will be called to the ordering clinician or representative by the Radiologist Assistant, and communication documented in the PACS or [employer]. Electronically Signed   By: Bretta Bang III M.D.   On: 01/23/2021 09:53   DG CHEST PORT 1 VIEW  Result Date: 01/21/2021 CLINICAL DATA:  Unresponsive, respirator dependent EXAM: PORTABLE CHEST 1 VIEW COMPARISON:  01/19/2021 chest radiograph. FINDINGS: Endotracheal tube tip is 3.0 cm above the carina. Enteric tube enters stomach with the tip not seen on this image. Stable cardiomediastinal silhouette with normal heart size. No pneumothorax. No pleural effusion. Mild medial bibasilar atelectasis, new. No pulmonary edema. IMPRESSION: 1. Well-positioned endotracheal and enteric tubes. 2. New mild medial bibasilar atelectasis. Electronically Signed   By: Delbert Phenix M.D.   On: 01/21/2021 08:39   DG Chest Portable 1 View  Result Date: 01/19/2021 CLINICAL DATA:  Unresponsive. EXAM: PORTABLE CHEST 1 VIEW COMPARISON:  Chest x-ray dated December 30, 2020. FINDINGS: Endotracheal tube in place with the tip 4.7 cm above the carina. Enteric tube in the stomach. The heart size and mediastinal contours are within normal limits. Normal pulmonary vascularity. No focal consolidation, pleural effusion, or pneumothorax. No acute osseous abnormality. Prior cholecystectomy. IMPRESSION: 1. Appropriately positioned endotracheal and enteric tubes. 2. No active disease. Electronically Signed   By: Obie Dredge M.D.   On: 01/19/2021 12:30   DG Abd Portable  1V  Result Date: 01/20/2021 CLINICAL DATA:  Enteric tube placement. EXAM: PORTABLE ABDOMEN - 1 VIEW COMPARISON:  Chest x-ray 01/19/2021 FINDINGS: Evidence of patient's enteric tube in adequate position with tip just left of midline over the stomach in the upper abdomen. Surgical clips over the gallbladder fossa. Nonobstructive bowel gas pattern. Remainder the exam is unchanged. IMPRESSION: Enteric tube with tip just left of midline over the stomach in the upper abdomen. Electronically Signed   By: Elberta Fortis M.D.   On: 01/20/2021 10:49   EEG adult  Result Date: 01/19/2021 Charlsie Quest, MD     01/19/2021  1:30 PM Patient Name: BETHEL GAGLIO MRN: [id number] Epilepsy Attending: Charlsie Quest Referring Physician/Provider: Dr Caryl Pina Date: 01/19/2021 Duration: 27.02 mins Patient history: 70 year old male presenting with GTC seizures. EEG to evaluate for seizure Level of alertness:  lethargic AEDs during EEG study: LEV, Versed Technical aspects: This EEG study was done with scalp electrodes positioned according to the 10-20 International system of electrode placement. Electrical activity was acquired at a sampling rate of 500Hz  and reviewed with a high frequency filter of 70Hz  and a low frequency filter of 1Hz . EEG data were recorded continuously and digitally stored. Description: EEG showed continuous generalized polymorphic sharply contoured 3 to 6 Hz theta-delta slowing. There is an excessive amount of 15 to 18 Hz beta activity with irregular  morphology distributed symmetrically and diffusely. Hyperventilation and photic stimulation were not performed.   ABNORMALITY - Continuous slow, generalized - Excessive beta, generalized IMPRESSION: This study is suggestive of moderate diffuse encephalopathy, nonspecific etiology. The excessive beta activity seen in the background is most likely due to the effect of benzodiazepine and is a benign EEG pattern. No seizures or epileptiform discharges were seen  throughout the recording. Priyanka Annabelle Harman   Overnight EEG with video  Result Date: 01/21/2021 Charlsie Quest, MD     01/21/2021  9:34 AM Patient Name: STEPEN PRINS MRN: [id number] Epilepsy Attending: Charlsie Quest Referring Physician/Provider: Dr Caryl Pina Duration: 01/20/2021 0809 to 01/21/2021 0809  Patient history: 70 year old male presenting with GTC seizures. EEG to evaluate for seizure  Level of alertness:  comatose/sedated  AEDs during EEG study: LEV, Versed, propofol  Technical aspects: This EEG study was done with scalp electrodes positioned according to the 10-20 International system of electrode placement. Electrical activity was acquired at a sampling rate of  and reviewed with a high frequency filter of  and a low frequency filter of . EEG data were recorded continuously and digitally stored.  Description: EEG initially showed burst suppression pattern with bursts of low amplitude 15-18hz  beta activity admixed with 5-7hz  theta slowing lasting 1-2 seconds alternating with generalized suppression lasting 2-3 seconds.Gradually, as propofol was weaned off around 1730 on 01/20/2021, eeg showed continuous generalized polymorphic sharply contoured 3 to 6 Hz theta-delta slowing at times with triphasic morphology. Hyperventilation and photic stimulation were not performed.    ABNORMALITY - Burst suppression, generalized - Continuous slow, generalized  IMPRESSION: This study was initially suggestive of profound diffuse encephalopathy likely due to sedation. As propofol was weaned, eeg was suggestive of moderate to severe diffuse encephalopathy, nonspecific etiology. No seizures or epileptiform discharges were seen throughout the recording.  Charlsie Quest   ECHOCARDIOGRAM COMPLETE  Result Date: 01/24/2021    ECHOCARDIOGRAM REPORT   Patient Name:   FRAN MCREE Date of Exam: 01/24/2021 Medical Rec #:  [id number]     Height: Accession #:    [id number]    Weight:       133.6 lb Date of  Birth:  [birth year]     BSA:          1.543 m Patient Age:    69 years      BP:           101/69 mmHg Patient Gender: M             HR:           60 bpm. Exam Location:  Inpatient Procedure: 2D Echo Indications:    stroke  History:        Patient has no prior history of Echocardiogram examinations.                 COPD and chronic kidney disease, Arrythmias:Atrial Fibrillation;                 Risk Factors:Hypertension.  Sonographer:    Delcie Roch Referring Phys: [id number] [facility]  Sonographer Comments: Echo performed with patient supine and on artificial respirator and suboptimal parasternal window. Image acquisition challenging due to uncooperative patient. IMPRESSIONS  1. Left ventricular ejection fraction, by estimation, is 60 to 65%. The left ventricle has normal function. The left ventricle has no regional wall motion abnormalities. Left ventricular diastolic parameters were normal.  2. Right ventricular systolic function is normal. The right  ventricular size is normal.  3. The mitral valve is normal in structure. No evidence of mitral valve regurgitation. No evidence of mitral stenosis.  4. The aortic valve has an indeterminant number of cusps. Aortic valve regurgitation is not visualized. No aortic stenosis is present.  5. The inferior vena cava is normal in size with greater than 50% respiratory variability, suggesting right atrial pressure of 3 mmHg. FINDINGS  Left Ventricle: Left ventricular ejection fraction, by estimation, is 60 to 65%. The left ventricle has normal function. The left ventricle has no regional wall motion abnormalities. The left ventricular internal cavity size was normal in size. There is  no left ventricular hypertrophy. Left ventricular diastolic parameters were normal. Right Ventricle: The right ventricular size is normal.Right ventricular systolic function is normal. Left Atrium: Left atrial size was normal in size. Right Atrium: Right atrial size was normal in size.  Pericardium: There is no evidence of pericardial effusion. Mitral Valve: The mitral valve is normal in structure. No evidence of mitral valve regurgitation. No evidence of mitral valve stenosis. Tricuspid Valve: The tricuspid valve is normal in structure. Tricuspid valve regurgitation is trivial. No evidence of tricuspid stenosis. Aortic Valve: The aortic valve has an indeterminant number of cusps. Aortic valve regurgitation is not visualized. No aortic stenosis is present. Pulmonic Valve: The pulmonic valve was not well visualized. Pulmonic valve regurgitation is not visualized. No evidence of pulmonic stenosis. Aorta: The aortic root is normal in size and structure. Venous: The inferior vena cava is normal in size with greater than 50% respiratory variability, suggesting right atrial pressure of 3 mmHg. IAS/Shunts: No atrial level shunt detected by color flow Doppler.  LEFT VENTRICLE PLAX 2D LVIDd:         4.00 cm  Diastology LVIDs:         2.50 cm  LV e' medial:    7.40 cm/s LV PW:         0.90 cm  LV E/e' medial:  10.8 LV IVS:        0.80 cm  LV e' lateral:   7.94 cm/s LVOT diam:     1.80 cm  LV E/e' lateral: 10.0 LV SV:         61 LV SV Index:   39 LVOT Area:     2.54 cm  RIGHT VENTRICLE             IVC RV S prime:     16.00 cm/s  IVC diam: 2.00 cm TAPSE (M-mode): 2.5 cm LEFT ATRIUM             Index       RIGHT ATRIUM           Index LA diam:        3.00 cm 1.94 cm/m  RA Area:     14.70 cm LA Vol (A2C):   44.3 ml 28.71 ml/m RA Volume:   37.40 ml  24.24 ml/m LA Vol (A4C):   42.4 ml 27.48 ml/m LA Biplane Vol: 44.0 ml 28.52 ml/m  AORTIC VALVE LVOT Vmax:   116.00 cm/s LVOT Vmean:  71.200 cm/s LVOT VTI:    0.238 m  AORTA Ao Root diam: 2.90 cm MITRAL VALVE MV Area (PHT): 3.37 cm    SHUNTS MV Decel Time: 225 msec    Systemic VTI:  0.24 m MV E velocity: 79.70 cm/s  Systemic Diam: 1.80 cm MV A velocity: 63.80 cm/s MV E/A ratio:  1.25 Olga Millers MD Electronically signed by  Olga Millers MD Signature  Date/Time: 01/24/2021/4:20:42 PM    Final    VAS US CAROTID  Result Date: 01/23/2021 Carotid Arterial Duplex Study Patient Name:  LUCAH PETTA  Date of Exam:   01/23/2021 Medical Rec #: [id number]      Accession #:    [id number] Date of Birth: [birth year]      Patient Gender: M Patient Age:   69Y Exam Location:  [facility] Procedure:      VAS US CAROTID Referring Phys: [id number] [facility] --------------------------------------------------------------------------------  Indications:       CVA. Risk Factors:      Hypertension, hyperlipidemia, current smoker, prior CVA,                    PAD. Other Factors:     Afib, CKD, COPD. Comparison Study:  No previous exams Performing Technologist: Ernestene Mention  Examination Guidelines: A complete evaluation includes B-mode imaging, spectral Doppler, color Doppler, and power Doppler as needed of all accessible portions of each vessel. Bilateral testing is considered an integral part of a complete examination. Limited examinations for reoccurring indications may be performed as noted.  Right Carotid Findings: +----------+--------+--------+--------+------------------+------------------+           PSV cm/sEDV cm/sStenosisPlaque DescriptionComments           +----------+--------+--------+--------+------------------+------------------+ CCA Prox  58      20                                intimal thickening +----------+--------+--------+--------+------------------+------------------+ CCA Distal627     144     >50%    heterogenous      intimal thickening +----------+--------+--------+--------+------------------+------------------+ ICA Prox  77      16      1-39%   heterogenous      intimal thickening +----------+--------+--------+--------+------------------+------------------+ ICA Distal51      20                                                   +----------+--------+--------+--------+------------------+------------------+ ECA       318      0       >50%    heterogenous                         +----------+--------+--------+--------+------------------+------------------+ +----------+--------+-------+----------------+-------------------+           PSV cm/sEDV cmsDescribe        Arm Pressure (mmHG) +----------+--------+-------+----------------+-------------------+ [id number]            Multiphasic, WNL                    +----------+--------+-------+----------------+-------------------+ +---------+--------+--------+-----------------+ VertebralPSV cm/sEDV cm/sSeverely dampened +---------+--------+--------+-----------------+  Left Carotid Findings: +----------+-------+--------+--------+-----------------------+-----------------+           PSV    EDV cm/sStenosisPlaque Description     Comments                    cm/s                                                            +----------+-------+--------+--------+-----------------------+-----------------+  CCA Prox  97     25                                     intimal                                                                   thickening        +----------+-------+--------+--------+-----------------------+-----------------+ CCA Distal98     24              heterogenous           intimal                                                                   thickening        +----------+-------+--------+--------+-----------------------+-----------------+ ICA Prox  269    69      60-79%  heterogenous and                                                          calcific                                 +----------+-------+--------+--------+-----------------------+-----------------+ ICA Distal125    27                                                       +----------+-------+--------+--------+-----------------------+-----------------+ ECA       564    39      >50%    heterogenous                              +----------+-------+--------+--------+-----------------------+-----------------+ +----------+-------+--------+-------------------------------+------------------+           PSV    EDV cm/sDescribe                       Arm Pressure                 cm/s                                          (mmHG)             +----------+-------+--------+-------------------------------+------------------+ [id number]     13      Low resistive, post stenotic  flow                                              +----------+-------+--------+-------------------------------+------------------+ +---------+--------+--+--------+--+---------+ VertebralPSV cm/s42EDV cm/s13Antegrade +---------+--------+--+--------+--+---------+   Summary: Right Carotid: Velocities in the right ICA are consistent with a 1-39% stenosis.                Hemodynamically significant plaque 80-99% visualized in the CCA.                The ECA appears >50% stenosed. Left Carotid: Velocities in the left ICA are consistent with a 60-79% stenosis.               Non-hemodynamically significant plaque <50% noted in the CCA. The               ECA appears >50% stenosed. Vertebrals:  Left vertebral artery demonstrates antegrade flow. Right vertebral              artery shows severely dampened waveforms. Subclavians: Normal flow hemodynamics were seen in the right subclavian artery.              Left subclavian artery shows low resistive, post stenotic              waveforms. *See table(s) above for measurements and observations.  Vascular consult recommended. Electronically signed by Gretta Began MD on 01/23/2021 at 9:43:00 PM.    Final    VAS [place] TRANSCRANIAL DOPPLER  Result Date: 01/25/2021  Transcranial Doppler Patient Name:  TIJUAN DANTES  Date of Exam:   01/25/2021 Medical Rec #: [id number]      Accession #:    [id number] Date of Birth: 1951-08-01      Patient Gender: M Patient Age:   69Y Exam  Location:  [facility] Procedure:      VAS [place] TRANSCRANIAL DOPPLER Referring Phys: [id number] [facility] --------------------------------------------------------------------------------  Indications: Stroke. Comparison Study: no prior Performing Technologist: Blanch Media RVS  Examination Guidelines: A complete evaluation includes B-mode imaging, spectral Doppler, color Doppler, and power Doppler as needed of all accessible portions of each vessel. Bilateral testing is considered an integral part of a complete examination. Limited examinations for reoccurring indications may be performed as noted.  +----------+-------------+----------+-----------+-------+ RIGHT TCD Right VM (cm)Depth (cm)PulsatilityComment +----------+-------------+----------+-----------+-------+ MCA           58.00                 1.10            +----------+-------------+----------+-----------+-------+ ACA          -32.00                 0.88            +----------+-------------+----------+-----------+-------+ Term ICA      43.00                 1.01            +----------+-------------+----------+-----------+-------+ PCA           35.00                 0.60            +----------+-------------+----------+-----------+-------+ Opthalmic     11.00                 2.08            +----------+-------------+----------+-----------+-------+  ICA siphon    46.00                 1.27            +----------+-------------+----------+-----------+-------+ Vertebral    -28.00                 0.99            +----------+-------------+----------+-----------+-------+  +----------+------------+----------+-----------+------------------+ LEFT TCD  Left VM (cm)Depth (cm)Pulsatility     Comment       +----------+------------+----------+-----------+------------------+ MCA          20.00                 0.79                       +----------+------------+----------+-----------+------------------+  ACA          -23.00                0.61                       +----------+------------+----------+-----------+------------------+ Term ICA                                   unable to insonate +----------+------------+----------+-----------+------------------+ PCA                                        unable to insonate +----------+------------+----------+-----------+------------------+ Opthalmic    17.00                 1.17                       +----------+------------+----------+-----------+------------------+ ICA siphon   53.00                 1.18                       +----------+------------+----------+-----------+------------------+ Vertebral    -24.00                1.18                       +----------+------------+----------+-----------+------------------+  +------------+-------+-------+             VM cm/sComment +------------+-------+-------+ Dist Basilar-20.00         +------------+-------+-------+ Summary:  Poor left temporal window limits evaluation of anetrior circulation on left otherwise normal mean flow velocities in majority of identified vessels of anterior and posterior cerebral circulation. *See table(s) above for TCD measurements and observations.  Diagnosing physician: Delia Heady MD Electronically signed by Delia Heady MD on 01/25/2021 at 11:58:40 AM.    Final    US Abdomen Limited RUQ (LIVER/GB)  Result Date: 01/27/2021 CLINICAL DATA:  Hyperbilirubinemia. EXAM: ULTRASOUND ABDOMEN LIMITED RIGHT UPPER QUADRANT COMPARISON:  None. FINDINGS: Gallbladder: Gallbladder is surgically absent. Common bile duct: Diameter: 6.7 millimeter Liver: The liver parenchyma is normal in echogenicity. Numerous hepatic cysts are identified. In the LEFT hepatic lobe, cyst is 3.0 x 3.0 x 4.0 centimeters. A second cyst is 2 x 2 x 2 centimeters. In the RIGHT hepatic lobe, cyst is 4 x 3 x 3 centimeters. No solid or suspicious liver masses. Portal vein is patent on  color Doppler imaging with normal  direction of blood flow towards the liver. Other: None. IMPRESSION: 1. Multiple hepatic cysts. 2. Cholecystectomy. 3. No biliary duct dilatation identified. Electronically Signed   By: Norva PavlovElizabeth  Brown M.D.   On: 01/27/2021 13:45     PHYSICAL EXAM  Temp:  [97.9 F (36.6 C)-99.6 F (37.6 C)] 97.9 F (36.6 C) (05/16 0800) Pulse Rate:  [60-79] 66 (05/16 0808) Resp:  [18-25] 23 (05/16 0808) BP: (85-152)/(59-73) 152/73 (05/16 0808) SpO2:  [93 %-100 %] 99 % (05/16 0808) FiO2 (%):  [30 %] 30 % (05/16 0808) Weight:  [60.7 kg] 60.7 kg (05/16 0400)  General - Well nourished, well developed, intubated on precedex.  Ophthalmologic - fundi not visualized due to noncooperation.  Cardiovascular - Regular rate and rhythm.  Neuro - intubated on precedex, eyes widely open, awake alert, following simple commands both midline and peripheral. Eyes in mid position, tracking bilaterally, but not blinking to visual threat on the left, inconsistently blinking to visual threat on the right, PERRL. Corneal reflex present, gag and cough present. Breathing over the vent.  Facial symmetry not able to test due to ET tube.  Tongue protrusion not cooperative. BUE on restrain, finger grip equally bilaterally, BLEs 4/5 proximal and 3/5 distally, equal bilaterally. No babinski. Sensation, coordination not cooperative and gait not tested.   ASSESSMENT/PLAN Barnet Glasgowerry F Vadenis a 70 year old malewith a PMHx ofCVA, HTN, atrialfibrillation on aspirin and Plavix, COPD, and CKDpresenting with GTC seizures found to have a possible subcentimeter acute right parietal cortical infarct.   Stroke - several punctate acute right parietal cortical infarct, etiology uncertain, AF not AC vs. Right CCA and M1 high grade stenosis  CT:  No evidence of acute intracranial abnormality. Old left PCA infarct.  MRI  Possible subcentimeter acute right parietal cortical infarcts along the margin of a chronic  parieto-occipital infarct. Large chronic left PCA infarct and multiple chronic lacunar infarcts.   TCD unremarkable  Carotid Doppler:  right CCA 80-99%.  Left ICA 60 to 79% stenosis.  Right ABI severely dampened waveforms.  Left subclavian artery low resistant, post stenotic waveforms  CTA head and neck - severe right CCA 80% stenosis. 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 50% right subclavian A stenosis. B/l ICA siphon athero with moderate stenosis. Right M1 short segment severe stenosis.   2D Echo EF 60 to 65%  VTE prophylaxis - heparin 5000 units ppx   No antithrombotics PTA, was on DAPT, currently Plavix on hold in preparation for tracheostomy.  Continue aspirin 325.  Therapy recommendations: Pending  Disposition:   Pending  A fib not on AC  A fib history not on anticoagulant but on DAPT prior to admission.   Sinus rhythm currently  Need to consider AC when no further procedure planned.  Seizure  No clare history of seizures.  cEEG with Triphasics 2-3 Hz on ictal-interictal spectrum but no seizures. Also notable for moderate to severe diffuse encephalopathy.   cEEG was disontinued after LTM for more than 48 hours with no seizures.  Continue Keppra 1000mg  BID IV->PO via cortrak  Carotid stenosis  history of right CEA 2017  Carotid Doppler:  right CCA 80-99%.  Left ICA 60 to 79% stenosis.  Right ABI severely dampened waveforms.  Left subclavian artery low resistant, post stenotic waveforms  CTA head and neck severe right CCA 80% stenosis. 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   50% right subclavian A stenosis.   Will consult VVS  Hypertension  Home  meds:  Norvasc 5, Lasix 20mg , Zestril 20mg    Currently on Levophed  Avoid low BP  Long-term BP goal 130-150 given severe carotid stenosis  Acute hypoxic resp failure  Aspiration PNA  Management per CCM, appreciated  Self -extubated 5/13 -> reintubated  +Moraxella catarrhalis on resp culture.   CCM planning IV Unasyn x 7 days.   Still has copious thick secretions  Tracheostomy planned next week after Plavix washout  Hyperlipidemia  Home meds:  Lipitor 40mg    LDL 43, goal < 70  LFT elevated, AST/ALT 56/54-> 65/93->38/66  Lipitor resumed  Continue statin at discharge  Dysphagia  NPO  Speech on board  On TF @55cc /hr  Diabetes type II Controlled  HgbA1c 5.3, goal < 7.0  CBGs  SSI  Close outpatient PCP follow-up  Other Stroke Risk Factors  Advanced Age >/= 10   History of stroke Large chronic left PCA infarct and multiple chronic lacunar infarcts.  Other Active Problems  Hypokalemia, K3.1->3.6->3.9->3.3->4.7  Leukocytosis WBC 7.9->14.6->9.2->10.5  AKI Cre 1.23->1.16->1.29  Hospital day # 10  This patient is critically ill due to right MCA infarct, b/l carotid stenosis, seizure, subclavian steal, respiratory failure needed re-intubation, PAF and at significant risk of neurological worsening, death form recurrent stroke, hemorrhagic conversion, status epilepticus, heart failure, sepsis. This patient's care requires constant monitoring of vital signs, hemodynamics, respiratory and cardiac monitoring, review of multiple databases, neurological assessment, discussion with family, other specialists and medical decision making of high complexity. I spent 35 minutes of neurocritical care time in the care of this patient. Discussed with Dr. Merrily Pew CCM.   Marvel Plan, MD PhD Stroke Neurology 01/29/2021 9:56 AM    To contact Stroke Continuity provider, please refer to [email or web address]. After hours, contact General Neurology

## 2021-01-30 DIAGNOSIS — I6330 Cerebral infarction due to thrombosis of unspecified cerebral artery: Secondary | ICD-10-CM | POA: Diagnosis not present

## 2021-01-30 DIAGNOSIS — J9602 Acute respiratory failure with hypercapnia: Secondary | ICD-10-CM

## 2021-01-30 DIAGNOSIS — R569 Unspecified convulsions: Secondary | ICD-10-CM | POA: Diagnosis not present

## 2021-01-30 DIAGNOSIS — I6523 Occlusion and stenosis of bilateral carotid arteries: Secondary | ICD-10-CM | POA: Diagnosis not present

## 2021-01-30 DIAGNOSIS — I480 Paroxysmal atrial fibrillation: Secondary | ICD-10-CM | POA: Diagnosis not present

## 2021-01-30 LAB — GLUCOSE, CAPILLARY
Glucose-Capillary: 106 mg/dL — ABNORMAL HIGH (ref 70–99)
Glucose-Capillary: 111 mg/dL — ABNORMAL HIGH (ref 70–99)
Glucose-Capillary: 125 mg/dL — ABNORMAL HIGH (ref 70–99)
Glucose-Capillary: 127 mg/dL — ABNORMAL HIGH (ref 70–99)
Glucose-Capillary: 131 mg/dL — ABNORMAL HIGH (ref 70–99)
Glucose-Capillary: 147 mg/dL — ABNORMAL HIGH (ref 70–99)

## 2021-01-30 LAB — BASIC METABOLIC PANEL
Anion gap: 5 (ref 5–15)
BUN: 26 mg/dL — ABNORMAL HIGH (ref 8–23)
CO2: 26 mmol/L (ref 22–32)
Calcium: 7.9 mg/dL — ABNORMAL LOW (ref 8.9–10.3)
Chloride: 110 mmol/L (ref 98–111)
Creatinine, Ser: 1.39 mg/dL — ABNORMAL HIGH (ref 0.61–1.24)
GFR, Estimated: 54 mL/min — ABNORMAL LOW (ref >60)
Glucose, Bld: 146 mg/dL — ABNORMAL HIGH (ref 70–99)
Potassium: 4.2 mmol/L (ref 3.5–5.1)
Sodium: 141 mmol/L (ref 135–145)

## 2021-01-30 LAB — CBC
HCT: 32.5 % — ABNORMAL LOW (ref 39.0–52.0)
Hemoglobin: 10.7 g/dL — ABNORMAL LOW (ref 13.0–17.0)
MCH: 32.3 pg (ref 26.0–34.0)
MCHC: 32.9 g/dL (ref 30.0–36.0)
MCV: 98.2 fL (ref 80.0–100.0)
Platelets: 235 10*3/uL (ref 150–400)
RBC: 3.31 MIL/uL — ABNORMAL LOW (ref 4.22–5.81)
RDW: 13.6 % (ref 11.5–15.5)
WBC: 11.5 10*3/uL — ABNORMAL HIGH (ref 4.0–10.5)
nRBC: 0 % (ref 0.0–0.2)

## 2021-01-30 MED ORDER — LEVETIRACETAM 100 MG/ML PO SOLN
1000.0000 mg | Freq: Two times a day (BID) | ORAL | Status: DC
  Administered 2021-01-30 – 2021-02-02 (×7): 1000 mg
  Filled 2021-01-30 (×7): qty 10

## 2021-01-30 MED ORDER — VITAMIN B-12 1000 MCG PO TABS: 1000.0000 ug | ORAL_TABLET | Freq: Every day | ORAL | Status: DC

## 2021-01-30 MED ORDER — VITAMIN B-12 1000 MCG PO TABS
1000.0000 ug | ORAL_TABLET | Freq: Every day | ORAL | Status: DC
  Administered 2021-01-30 – 2021-02-02 (×4): 1000 ug
  Filled 2021-01-30 (×3): qty 1

## 2021-01-30 NOTE — Progress Notes (Signed)
Physical Therapy Treatment Patient Details Name: Adam Mcclain DOB: 08/22/1951 Today's Date: 01/30/2021    History of Present Illness 70 y.o. male admitted on 01/19/21 for seizure.  Found to have several punctate acute right parietal cortical infarcts, etiology unclear (possibly a fib no on anticoagulant vs R CCA high grade stenosis).  He was intubated in the ED, extubated on 5/10, but had to be re-intubated due to vomiting and aspirating, extubated again on 5/12, but had to be re-intubated later that day.  Pt then self-extubated on 5/13 trach 5/16 PMH of stroke, PAD, HTN, nephrolithiasis, A-fib.    PT Comments    Pt progressing steadily towards his physical therapy goals. Able to answer yes/no questions with head nods with 75% accuracy and attempts to mouth responses. Requiring up to two person moderate assist for functional mobility. Performed x 2 sit to stands from edge of bed; demonstrates increased trunk/hip/knee flexion. SpO2 100% on 30% FiO2, 5 PEEP. Bed left in chair position to promote upright. Continue to recommend comprehensive inpatient rehab (CIR) for post-acute therapy needs.    Follow Up Recommendations  CIR     Equipment Recommendations  Wheelchair (measurements PT);Wheelchair cushion (measurements PT);Hospital bed    Recommendations for Other Services       Precautions / Restrictions Precautions Precautions: Fall Precaution Comments: L attention/ mitten, trach. flexiseal, condom foley, posey bed alarm Required Braces or Orthoses: Other Brace (L prafo requested by PT 5/17) Restrictions Weight Bearing Restrictions: No    Mobility  Bed Mobility Overal bed mobility: Needs Assistance Bed Mobility: Rolling;Supine to Sit;Sit to Supine Rolling: Mod assist   Supine to sit: Mod assist Sit to supine: +2 for physical assistance;Mod assist   General bed mobility comments: mod cues for sequence and pushing up on the L side of the bed for EOB sitting. pt  sitting with Min to mod (A) during session    Transfers Overall transfer level: Needs assistance Equipment used: 2 person hand held assist Transfers: Sit to/from Stand Sit to Stand: +2 physical assistance;Mod assist         General transfer comment: Pt initiated task with counting but unable to sustain. pt with flexion of neck hips and kness. Pt completed x2 sit<>stand  Ambulation/Gait                 Stairs             Wheelchair Mobility    Modified Rankin (Stroke Patients Only) Modified Rankin (Stroke Patients Only) Pre-Morbid Rankin Score: No symptoms Modified Rankin: Severe disability     Balance Overall balance assessment: Needs assistance Sitting-balance support: Bilateral upper extremity supported;Feet supported Sitting balance-Leahy Scale: Fair Sitting balance - Comments: able to extend neck on commands but unable to sustain Postural control: Left lateral lean Standing balance support: Bilateral upper extremity supported Standing balance-Leahy Scale: Poor Standing balance comment: reliant on external support                            Cognition Arousal/Alertness: Awake/alert Behavior During Therapy: Restless Overall Cognitive Status: No family/caregiver present to determine baseline cognitive functioning Area of Impairment: Orientation;Attention;Memory;Following commands                 Orientation Level: Disoriented to;Place;Time;Situation Current Attention Level: Sustained Memory: Decreased recall of precautions Following Commands: Follows one step commands with increased time;Follows one step commands inconsistently       General Comments: Pt was able  to correctly nod head yes to name/ date of birth mouthed May. pt correctly responds to simple commands like lets put on your sock by lifting foot. pt makes a shocked face when informed that he is in hospital. pt did not seem aware of this information      Exercises  General Exercises - Lower Extremity Long Arc Quad: Both;5 reps;Seated    General Comments General comments (skin integrity, edema, etc.): vent peep 5 FIO2 30%      Pertinent Vitals/Pain Pain Assessment: Faces Faces Pain Scale: Hurts a little bit Pain Location: generalized Pain Descriptors / Indicators: Restless Pain Intervention(s): Monitored during session    Home Living Family/patient expects to be discharged to:: Other (Comment)               Additional Comments: No family present and unable to gather from patient due to vent at this time. Pt able to mouth several times but can be difficult to understand    Prior Function        Comments: unknown   PT Goals (current goals can now be found in the care plan section) Acute Rehab PT Goals Patient Stated Goal: agreeable to EOB Potential to Achieve Goals: Good Progress towards PT goals: Progressing toward goals    Frequency    Min 4X/week      PT Plan Frequency needs to be updated    Co-evaluation   Reason for Co-Treatment: Complexity of the patient's impairments (multi-system involvement);Necessary to address cognition/behavior during functional activity;For patient/therapist safety;To address functional/ADL transfers   OT goals addressed during session: ADL's and self-care;Proper use of Adaptive equipment and DME;Strengthening/ROM      AM-PAC PT "6 Clicks" Mobility   Outcome Measure  Help needed turning from your back to your side while in a flat bed without using bedrails?: A Lot Help needed moving from lying on your back to sitting on the side of a flat bed without using bedrails?: A Lot Help needed moving to and from a bed to a chair (including a wheelchair)?: Total Help needed standing up from a chair using your arms (e.g., wheelchair or bedside chair)?: A Lot Help needed to walk in hospital room?: Total Help needed climbing 3-5 steps with a railing? : Total 6 Click Score: 9    End of Session  Equipment Utilized During Treatment: Oxygen Activity Tolerance: Patient tolerated treatment well Patient left: in bed;with call bell/phone within reach;with bed alarm set Nurse Communication: Mobility status PT Visit Diagnosis: Muscle weakness (generalized) (M62.81);Difficulty in walking, not elsewhere classified (R26.2);Other symptoms and signs involving the nervous system (R29.898);Hemiplegia and hemiparesis Hemiplegia - Right/Left: Left Hemiplegia - dominant/non-dominant: Non-dominant Hemiplegia - caused by: Cerebral infarction     Time: 0825-0855 PT Time Calculation (min) (ACUTE ONLY): 30 min  Charges:  $Therapeutic Activity: 8-22 mins                     Lillia Pauls, PT, DPT Acute Rehabilitation Services Pager [phone] Office [phone]    Adam Mcclain 01/30/2021, 9:49 AM

## 2021-01-30 NOTE — Progress Notes (Signed)
Waster 50 cc versed gtt with Rudean Hitt RN at this time.

## 2021-01-30 NOTE — Progress Notes (Signed)
STROKE TEAM PROGRESS NOTE   SUBJECTIVE (INTERVAL HISTORY) RN is at the bedside.  Pt had tracheostomy yesterday, currently still on vent, but tolerating well, plan to transition to trach collar.  Neurological stable.  Plan to transition to Eliquis tomorrow.  Vascular surgery consulted and no acute procedure given patient current acute illness.   OBJECTIVE Temp:  [97.5 F (36.4 C)-99.5 F (37.5 C)] 97.5 F (36.4 C) (05/17 0800) Pulse Rate:  [59-81] 63 (05/17 1900) Cardiac Rhythm: Normal sinus rhythm (05/17 1200) Resp:  [16-31] 16 (05/17 1700) BP: (96-145)/(45-127) 96/45 (05/17 1900) SpO2:  [93 %-100 %] 100 % (05/17 1900) FiO2 (%):  [30 %] 30 % (05/17 1451) Weight:  [60.7 kg] 60.7 kg (05/17 0436)  Recent Labs  Lab 01/30/21 0359 01/30/21 0811 01/30/21 1146 01/30/21 1528 01/30/21 1956  GLUCAP 127* 147* 125* 131* 111*   Recent Labs  Lab 01/25/21 0414 01/25/21 1621 01/27/21 0607 01/27/21 0617 01/28/21 0042 01/29/21 0116 01/30/21 0157  NA 143   < > 141 143 141 142 141  K 3.1*   < > 4.8 3.9 3.3* 4.7 4.2  CL 114*  --  107  --  109 110 110  CO2 24  --  21*  --  25 23 26   GLUCOSE 126*  --  174*  --  200* 132* 146*  BUN 31*  --  20  --  19 28* 26*  CREATININE 1.19  --  1.23  --  1.16 1.29* 1.39*  CALCIUM 8.2*  --  8.2*  --  7.9* 8.1* 7.9*  MG 1.9  --   --   --  2.3  --   --   PHOS 2.3*  --   --   --   --   --   --    < > = values in this interval not displayed.   Recent Labs  Lab 01/25/21 0414 01/27/21 0607 01/29/21 0116  AST 56* 65* 38  ALT 54* 93* 66*  ALKPHOS 84 93 91  BILITOT 0.3 1.9* 0.8  PROT 5.4* 6.1* 5.5*  ALBUMIN 1.9* 2.2* 2.1*   Recent Labs  Lab 01/25/21 0414 01/25/21 1621 01/27/21 0607 01/27/21 0617 01/28/21 0042 01/29/21 0116 01/30/21 0157  WBC 7.9  --  14.6*  --  9.2 10.5 11.5*  HGB 11.2*   < > 12.9* 11.9* 11.2* 12.2* 10.7*  HCT 34.5*   < > 38.9* 35.0* 33.1* 37.8* 32.5*  MCV 96.6  --  96.3  --  95.1 97.9 98.2  PLT 130*  --  300  --  248 239  235   < > = values in this interval not displayed.   No results for input(s): CKTOTAL, CKMB, CKMBINDEX, TROPONINI in the last 168 hours. No results for input(s): LABPROT, INR in the last 72 hours. No results for input(s): COLORURINE, LABSPEC, PHURINE, GLUCOSEU, HGBUR, BILIRUBINUR, KETONESUR, PROTEINUR, UROBILINOGEN, NITRITE, LEUKOCYTESUR in the last 72 hours.  Invalid input(s): APPERANCEUR     Component Value Date/Time   CHOL 118 01/23/2021 1215   TRIG 309 (H) 01/26/2021 0436   HDL 15 (L) 01/23/2021 1215   CHOLHDL 7.9 01/23/2021 1215   VLDL 60 (H) 01/23/2021 1215   LDLCALC 43 01/23/2021 1215   Lab Results  Component Value Date   HGBA1C 5.3 01/23/2021      Component Value Date/Time   LABOPIA NONE DETECTED 01/19/2021 1237   COCAINSCRNUR NONE DETECTED 01/19/2021 1237   LABBENZ POSITIVE (A) 01/19/2021 1237   AMPHETMU NONE DETECTED 01/19/2021  1237   THCU NONE DETECTED 01/19/2021 1237   LABBARB NONE DETECTED 01/19/2021 1237    No results for input(s): ETH in the last 168 hours.  I have personally reviewed the radiological images below and agree with the radiology interpretations.  CT ANGIO HEAD NECK W WO CM  Result Date: 01/29/2021 CLINICAL DATA:  Follow-up examination for stroke. EXAM: CT ANGIOGRAPHY HEAD AND NECK TECHNIQUE: Multidetector CT imaging of the head and neck was performed using the standard protocol during bolus administration of intravenous contrast. Multiplanar CT image reconstructions and MIPs were obtained to evaluate the vascular anatomy. Carotid stenosis measurements (when applicable) are obtained utilizing NASCET criteria, using the distal internal carotid diameter as the denominator. CONTRAST:  35mL OMNIPAQUE IOHEXOL 350 MG/ML SOLN COMPARISON:  Previous MRI from 01/22/2021. FINDINGS: CT HEAD FINDINGS Brain: Age-related cerebral atrophy with chronic small vessel ischemic disease. Chronic right parieto-occipital infarct again noted. Additional chronic left PCA  distribution infarct noted as well. Encephalomalacia at the anterior temporal pole on the right likely related to remote trauma. Recently identified small right parietal cortical infarcts not well visualized by CT. No intracranial hemorrhage or mass effect. No other new large vessel territory infarct. No mass effect or midline shift. No hydrocephalus or extra-axial fluid collection. Vascular: No hyperdense vessel. Calcified atherosclerosis at the skull base. Skull: Scalp soft tissues demonstrate no acute finding. Calvarium intact. Sinuses: Right frontoethmoidal and maxillary sinusitis. Nasogastric tube in place. Right greater than left bilateral mastoid effusions. Orbits: Globes and orbital soft tissues demonstrate no acute finding. Review of the MIP images confirms the above findings CTA NECK FINDINGS Aortic arch: Visualized aortic arch normal in caliber with normal 3 vessel morphology. Moderate atheromatous change about the arch itself. No significant stenosis about the innominate or left common carotid artery. The left subclavian artery occludes just distal to its origin. Distal reconstitution near the level of the left vertebral artery. Narrowed and attenuated flow seen distally within the left subclavian and axillary arteries (series 8, image 278). Additional 50% stenosis noted at the proximal right subclavian artery (series 8, image 312). Right subclavian artery irregular but otherwise patent. Right carotid system: Extensive atheromatous irregularity throughout the right CCA. Short-segment severe at least 80% stenosis noted involving the mid right CCA (series 8, image 262). Right common and internal carotid arteries irregular but otherwise patent to the skull base without stenosis, dissection or occlusion. Left carotid system: Diffuse atheromatous irregularity seen throughout the left CCA without high-grade stenosis. Concentric predominantly soft plaque about the left carotid bulb and proximal left ICA.  Associated stenosis of up to approximately 50% by NASCET criteria. Left ICA patent distally without additional stenosis, dissection or occlusion. Note made of an additional approximate 50% stenosis at the proximal left external carotid artery Vertebral arteries: Both vertebral arteries appear to arise from the subclavian arteries, and are occluded at their origins. On the left, there is distal reconstitution at the proximal V2 segment (series 8, image 254). Markedly narrowed and irregular flow seen distally within the proximal and mid left V2 segment. The left vertebral artery is more normal in caliber and patent at the V3 segment. Left vertebral artery widely patent as it courses into the cranial vault. Flow within the left vertebral artery may be retrograde in nature given the proximal left subclavian occlusion. On the right, there is irregular distal reconstitution at the distal right V2 segment the a muscular branch (series 8, image 195). Right vertebral artery patent distally to the skull base. Skeleton: No visible acute  osseous finding. No worrisome osseous lesions. 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 Patient is largely edentulous. Other neck: Endotracheal and enteric tubes in place. No other acute soft tissue abnormality within the neck. No mass or adenopathy. 5 mm left thyroid nodule noted, felt to be of doubtful significance given size and patient age, no follow-up imaging recommended (ref: J Am Coll Radiol. 2015 Feb;12(2): 143-50). Upper chest: Small layering bilateral pleural effusions partially visualized. Underlying emphysematous changes. Visualized upper chest demonstrates no other acute finding. Review of the MIP images confirms the above findings CTA HEAD FINDINGS Anterior circulation: Extensive atheromatous irregularity throughout the petrous, cavernous, and supraclinoid ICAs with associated moderate multifocal stenosis. Subtle 2-3 mm focal outpouching extending medially from  the cavernous left ICA suspicious for a small aneurysm (series 9, image 124). A1 segments patent bilaterally. Normal anterior communicating artery complex. Anterior cerebral arteries patent to their distal aspects without high-grade stenosis. Left M1 segment patent without high-grade stenosis. Short-segment moderate to severe right M1 stenosis (series 7, image 20). Negative MCA bifurcations. No proximal MCA branch occlusion. Distal MCA branches demonstrate diffuse atheromatous irregularity. Posterior circulation: Dominant left vertebral artery patent to the vertebrobasilar junction. Left PICA origin patent. Hypoplastic right vertebral artery largely terminates in PICA. Moderate proximal basilar artery stenosis (series 8, image 128). Basilar otherwise patent distally. Superior cerebellar arteries patent bilaterally. Both PCAs primarily supplied via the basilar. Right PCA well perfused to its distal aspect. Left PCA attenuated distally, in keeping with the chronic left PCA distribution infarct. Venous sinuses: Grossly patent allowing for timing the contrast bolus. Anatomic variants: Hypoplastic right vertebral artery terminates in PICA. Review of the MIP images confirms the above findings IMPRESSION: CT HEAD IMPRESSION: 1. Stable head CT. Recently identified possible small parietal infarcts not visible by CT. No other new acute intracranial abnormality. 2. Underlying atrophy with chronic ischemic changes as above. 3. Right-sided paranasal sinus disease. CTA HEAD AND NECK IMPRESSION: 1. 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 Left subclavian artery narrowed and attenuated but patent distally. 2. 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 Visualized flow within the left vertebral artery may be retrograde in nature given the proximal left  subclavian artery occlusion. 3. 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 4. 50% atheromatous stenosis at the left carotid bifurcation. 5. 50% proximal right subclavian artery stenosis. 6. Extensive atheromatous change throughout the carotid siphons with associated moderate multifocal stenosis. 7. Short-segment moderate to severe mid right M1 stenosis. 8. 2-3 mm focal outpouching extending medially from the cavernous left ICA, suspicious for a small aneurysm. 9. Small layering bilateral pleural effusions with underlying emphysema. Emphysema (ICD10-J43.9). Electronically Signed   By: Rise Mu M.D.   On: 01/29/2021 04:04   DG Chest 1 View  Result Date: 01/25/2021 CLINICAL DATA:  Intubation EXAM: CHEST  1 VIEW COMPARISON:  Portable exam 1553 hours compared to 01/23/2021 FINDINGS: Tip of endotracheal tube projects 2.9 cm above carina. Normal heart size, mediastinal contours, and pulmonary vascularity. Minimal atelectasis at lung bases. No definite infiltrate, pleural effusion or pneumothorax. IMPRESSION: Minimal bibasilar atelectasis. Electronically Signed   By: Ulyses Southward M.D.   On: 01/25/2021 17:12   DG Abd 1 View  Result Date: 01/25/2021 CLINICAL DATA:  Check gastric catheter placement EXAM: ABDOMEN - 1 VIEW COMPARISON:  None. FINDINGS: Gastric catheter is noted with the  tip in the stomach. Proximal side port lies in the distal esophagus however. This should be advanced several cm deeper into the stomach. IMPRESSION: Gastric catheter as described which should be advanced further into the stomach. Electronically Signed   By: Alcide Clever M.D.   On: 01/25/2021 18:50   CT Head Wo Contrast  Result Date: 01/19/2021 CLINICAL DATA:  Found down.  Possible seizure. EXAM: CT HEAD WITHOUT CONTRAST TECHNIQUE: Contiguous axial images were obtained from the base of the skull through the vertex without intravenous contrast. COMPARISON:  CT head December 30, 2020. FINDINGS: Brain: Similar encephalomalacia in the  anterolateral right temporal lobe, and bilateral occipital lobes. Similar small remote lacunar infarct versus dilated perivascular space in the left basal ganglia. No evidence of acute large vascular territory infarct. Moderate patchy white matter hypodensities, similar and likely related to chronic microvascular ischemic disease. No acute hemorrhage. Similar ex vacuo ventricular dilation without hydrocephalus. No visible extra-axial fluid collection. No mass lesion or abnormal mass effect. Vascular: No hyperdense vessel.  Calcific atherosclerosis. Skull: No acute fracture. Sinuses/Orbits: Sinuses are largely clear. No acute orbital abnormality. Other: No mastoid effusions. Debris in bilateral external auditory canals, presumably cerumen. Fluid layering in the pharynx, likely related to intubation. IMPRESSION: 1. No evidence of acute intracranial abnormality. 2. Similar encephalomalacia in the anterolateral right temporal lobe and bilateral occipital lobes. 3. Moderate chronic microvascular ischemic disease. Electronically Signed   By: Feliberto Harts MD   On: 01/19/2021 12:53   MR BRAIN WO CONTRAST  Result Date: 01/22/2021 CLINICAL DATA:  Seizure. EXAM: MRI HEAD WITHOUT CONTRAST TECHNIQUE: Multiplanar, multiecho pulse sequences of the brain and surrounding structures were obtained without intravenous contrast. COMPARISON:  Head CT 01/19/2021 and MRI 03/04/2019 FINDINGS: Brain: There is a moderate-sized chronic right parieto-occipital infarct with associated chronic blood products. Scattered subcentimeter foci of trace diffusion weighted signal hyperintensity in this region are largely felt to be secondary to susceptibility artifact from chronic blood products, however there may be a superimposed subcentimeter acute cortical infarct in the right parietal lobe along the anterosuperior aspect of the chronic encephalomalacia (series 3, image 36). No acute infarct is identified elsewhere. There is a large chronic  left PCA infarct with chronic blood products. There is also encephalomalacia anteriorly in the right temporal lobe which may reflect an old infarct or be secondary to remote trauma. Patchy T2 hyperintensities in the cerebral white matter bilaterally are nonspecific but compatible with moderate chronic small vessel ischemic disease. Chronic lacunar infarcts are noted in the deep white matter of the right cerebral hemisphere and in the left basal ganglia. There is mild to moderate cerebral atrophy. Scattered chronic microhemorrhages are noted in the cerebrum and cerebellum including in the deep gray nuclei and may reflect the sequelae of chronic hypertension. No mass, midline shift, or extra-axial fluid collection is evident. Vascular: Major intracranial vascular flow voids are preserved. Skull and upper cervical spine: Unremarkable bone marrow signal. Congenital C2-3 fusion. Sinuses/Orbits: Bilateral cataract extraction. Clear paranasal sinuses. Small to moderate bilateral mastoid effusions. Other: Fluid in the pharynx in the setting of intubation. IMPRESSION: 1. Possible subcentimeter acute right parietal cortical infarct along the margin of a chronic parieto-occipital infarct. 2. Large chronic left PCA infarct. 3. Encephalomalacia in the anterior right temporal lobe. 4. Moderate chronic small vessel ischemic disease with multiple chronic lacunar infarcts. Electronically Signed   By: Sebastian Ache M.D.   On: 01/22/2021 16:37   DG CHEST PORT 1 VIEW  Result Date: 01/29/2021 CLINICAL DATA:  Acute respiratory failure with hypercapnia. EXAM: PORTABLE CHEST 1 VIEW COMPARISON:  Jan 27, 2021. FINDINGS: The heart size and mediastinal contours are within normal limits. Tracheostomy and feeding tubes are in good position. No pneumothorax is noted. Mild bibasilar subsegmental atelectasis is noted. Minimal right pleural effusion is noted. The visualized skeletal structures are unremarkable. IMPRESSION: Mild bibasilar  subsegmental atelectasis. Minimal right pleural effusion. Tracheostomy and feeding tubes in good position Electronically Signed   By: Lupita Raider M.D.   On: 01/29/2021 16:31   DG CHEST PORT 1 VIEW  Result Date: 01/27/2021 CLINICAL DATA:  Encounter for intubation of airway. EXAM: PORTABLE CHEST 1 VIEW COMPARISON:  01/25/2021 FINDINGS: ET tube tip is above the carina. There is a feeding tube with tip well below the GE junction in the projection of the right abdomen. Mild bibasilar opacities. No pleural effusions or interstitial edema. IMPRESSION: 1. ET tube tip above the carina. 2. Mild bibasilar opacities compatible with atelectasis versus pneumonia. Electronically Signed   By: Signa Kell M.D.   On: 01/27/2021 05:57   DG CHEST PORT 1 VIEW  Result Date: 01/23/2021 CLINICAL DATA:  Hypoxia EXAM: PORTABLE CHEST 1 VIEW COMPARISON:  Jan 21, 2021 FINDINGS: Endotracheal tube tip is at the carina directed toward the right main bronchus. Nasogastric tube tip and side port in stomach. No edema or airspace opacity. Apparent nipple shadow on the left, stable. Heart size and pulmonary vascularity are normal. No adenopathy. No bone lesions. IMPRESSION: Endotracheal tube tip at carina directed toward the right main bronchus. Advise withdrawing endotracheal tube approximately 3 cm. No edema or airspace opacity. These results will be called to the ordering clinician or representative by the Radiologist Assistant, and communication documented in the PACS or [employer]. Electronically Signed   By: Bretta Bang III M.D.   On: 01/23/2021 09:53   DG CHEST PORT 1 VIEW  Result Date: 01/21/2021 CLINICAL DATA:  Unresponsive, respirator dependent EXAM: PORTABLE CHEST 1 VIEW COMPARISON:  01/19/2021 chest radiograph. FINDINGS: Endotracheal tube tip is 3.0 cm above the carina. Enteric tube enters stomach with the tip not seen on this image. Stable cardiomediastinal silhouette with normal heart size. No pneumothorax. No  pleural effusion. Mild medial bibasilar atelectasis, new. No pulmonary edema. IMPRESSION: 1. Well-positioned endotracheal and enteric tubes. 2. New mild medial bibasilar atelectasis. Electronically Signed   By: Delbert Phenix M.D.   On: 01/21/2021 08:39   DG Chest Portable 1 View  Result Date: 01/19/2021 CLINICAL DATA:  Unresponsive. EXAM: PORTABLE CHEST 1 VIEW COMPARISON:  Chest x-ray dated December 30, 2020. FINDINGS: Endotracheal tube in place with the tip 4.7 cm above the carina. Enteric tube in the stomach. The heart size and mediastinal contours are within normal limits. Normal pulmonary vascularity. No focal consolidation, pleural effusion, or pneumothorax. No acute osseous abnormality. Prior cholecystectomy. IMPRESSION: 1. Appropriately positioned endotracheal and enteric tubes. 2. No active disease. Electronically Signed   By: Obie Dredge M.D.   On: 01/19/2021 12:30   DG Abd Portable 1V  Result Date: 01/20/2021 CLINICAL DATA:  Enteric tube placement. EXAM: PORTABLE ABDOMEN - 1 VIEW COMPARISON:  Chest x-ray 01/19/2021 FINDINGS: Evidence of patient's enteric tube in adequate position with tip just left of midline over the stomach in the upper abdomen. Surgical clips over the gallbladder fossa. Nonobstructive bowel gas pattern. Remainder the exam is unchanged. IMPRESSION: Enteric tube with tip just left of midline over the stomach in the upper abdomen. Electronically Signed   By: Elberta Fortis M.D.  On: 01/20/2021 10:49   EEG adult  Result Date: 01/19/2021 Charlsie Quest, MD     01/19/2021  1:30 PM Patient Name: Adam Mcclain MRN: [id number] Epilepsy Attending: Charlsie Quest Referring Physician/Provider: Dr Caryl Pina Date: 01/19/2021 Duration: 27.02 mins Patient history: 70 year old male presenting with GTC seizures. EEG to evaluate for seizure Level of alertness:  lethargic AEDs during EEG study: LEV, Versed Technical aspects: This EEG study was done with scalp electrodes positioned according to  the 10-20 International system of electrode placement. Electrical activity was acquired at a sampling rate of 500Hz  and reviewed with a high frequency filter of 70Hz  and a low frequency filter of 1Hz . EEG data were recorded continuously and digitally stored. Description: EEG showed continuous generalized polymorphic sharply contoured 3 to 6 Hz theta-delta slowing. There is an excessive amount of 15 to 18 Hz beta activity with irregular morphology distributed symmetrically and diffusely. Hyperventilation and photic stimulation were not performed.   ABNORMALITY - Continuous slow, generalized - Excessive beta, generalized IMPRESSION: This study is suggestive of moderate diffuse encephalopathy, nonspecific etiology. The excessive beta activity seen in the background is most likely due to the effect of benzodiazepine and is a benign EEG pattern. No seizures or epileptiform discharges were seen throughout the recording. Priyanka Annabelle Harman   Overnight EEG with video  Result Date: 01/21/2021 Charlsie Quest, MD     01/21/2021  9:34 AM Patient Name: Adam Mcclain MRN: [id number] Epilepsy Attending: Charlsie Quest Referring Physician/Provider: Dr Caryl Pina Duration: 01/20/2021 0809 to 01/21/2021 0809  Patient history: 70 year old male presenting with GTC seizures. EEG to evaluate for seizure  Level of alertness:  comatose/sedated  AEDs during EEG study: LEV, Versed, propofol  Technical aspects: This EEG study was done with scalp electrodes positioned according to the 10-20 International system of electrode placement. Electrical activity was acquired at a sampling rate of 500Hz  and reviewed with a high frequency filter of 70Hz  and a low frequency filter of 1Hz . EEG data were recorded continuously and digitally stored.  Description: EEG initially showed burst suppression pattern with bursts of low amplitude 15-18hz  beta activity admixed with 5-7hz  theta slowing lasting 1-2 seconds alternating with generalized suppression  lasting 2-3 seconds.Gradually, as propofol was weaned off around 1730 on 01/20/2021, eeg showed continuous generalized polymorphic sharply contoured 3 to 6 Hz theta-delta slowing at times with triphasic morphology. Hyperventilation and photic stimulation were not performed.    ABNORMALITY - Burst suppression, generalized - Continuous slow, generalized  IMPRESSION: This study was initially suggestive of profound diffuse encephalopathy likely due to sedation. As propofol was weaned, eeg was suggestive of moderate to severe diffuse encephalopathy, nonspecific etiology. No seizures or epileptiform discharges were seen throughout the recording.  Charlsie Quest   ECHOCARDIOGRAM COMPLETE  Result Date: 01/24/2021    ECHOCARDIOGRAM REPORT   Patient Name:   Adam Mcclain Date of Exam: 01/24/2021 Medical Rec #:  [id number]     Height: Accession #:    [id number]    Weight:       133.6 lb Date of Birth:  29-Mar-1951     BSA:          1.543 m Patient Age:    69 years      BP:           101/69 mmHg Patient Gender: M             HR:  60 bpm. Exam Location:  Inpatient Procedure: 2D Echo Indications:    stroke  History:        Patient has no prior history of Echocardiogram examinations.                 COPD and chronic kidney disease, Arrythmias:Atrial Fibrillation;                 Risk Factors:Hypertension.  Sonographer:    Delcie RochLauren Pennington Referring Phys: [id number]: [id number] [facility] IncALMAN KHALIQDINA  Sonographer Comments: Echo performed with patient supine and on artificial respirator and suboptimal parasternal window. Image acquisition challenging due to uncooperative patient. IMPRESSIONS  1. Left ventricular ejection fraction, by estimation, is 60 to 65%. The left ventricle has normal function. The left ventricle has no regional wall motion abnormalities. Left ventricular diastolic parameters were normal.  2. Right ventricular systolic function is normal. The right ventricular size is normal.  3. The mitral valve is normal in  structure. No evidence of mitral valve regurgitation. No evidence of mitral stenosis.  4. The aortic valve has an indeterminant number of cusps. Aortic valve regurgitation is not visualized. No aortic stenosis is present.  5. The inferior vena cava is normal in size with greater than 50% respiratory variability, suggesting right atrial pressure of 3 mmHg. FINDINGS  Left Ventricle: Left ventricular ejection fraction, by estimation, is 60 to 65%. The left ventricle has normal function. The left ventricle has no regional wall motion abnormalities. The left ventricular internal cavity size was normal in size. There is  no left ventricular hypertrophy. Left ventricular diastolic parameters were normal. Right Ventricle: The right ventricular size is normal.Right ventricular systolic function is normal. Left Atrium: Left atrial size was normal in size. Right Atrium: Right atrial size was normal in size. Pericardium: There is no evidence of pericardial effusion. Mitral Valve: The mitral valve is normal in structure. No evidence of mitral valve regurgitation. No evidence of mitral valve stenosis. Tricuspid Valve: The tricuspid valve is normal in structure. Tricuspid valve regurgitation is trivial. No evidence of tricuspid stenosis. Aortic Valve: The aortic valve has an indeterminant number of cusps. Aortic valve regurgitation is not visualized. No aortic stenosis is present. Pulmonic Valve: The pulmonic valve was not well visualized. Pulmonic valve regurgitation is not visualized. No evidence of pulmonic stenosis. Aorta: The aortic root is normal in size and structure. Venous: The inferior vena cava is normal in size with greater than 50% respiratory variability, suggesting right atrial pressure of 3 mmHg. IAS/Shunts: No atrial level shunt detected by color flow Doppler.  LEFT VENTRICLE PLAX 2D LVIDd:         4.00 cm  Diastology LVIDs:         2.50 cm  LV e' medial:    7.40 cm/s LV PW:         0.90 cm  LV E/e' medial:  10.8  LV IVS:        0.80 cm  LV e' lateral:   7.94 cm/s LVOT diam:     1.80 cm  LV E/e' lateral: 10.0 LV SV:         61 LV SV Index:   39 LVOT Area:     2.54 cm  RIGHT VENTRICLE             IVC RV S prime:     16.00 cm/s  IVC diam: 2.00 cm TAPSE (M-mode): 2.5 cm LEFT ATRIUM             Index  RIGHT ATRIUM           Index LA diam:        3.00 cm 1.94 cm/m  RA Area:     14.70 cm LA Vol (A2C):   44.3 ml 28.71 ml/m RA Volume:   37.40 ml  24.24 ml/m LA Vol (A4C):   42.4 ml 27.48 ml/m LA Biplane Vol: 44.0 ml 28.52 ml/m  AORTIC VALVE LVOT Vmax:   116.00 cm/s LVOT Vmean:  71.200 cm/s LVOT VTI:    0.238 m  AORTA Ao Root diam: 2.90 cm MITRAL VALVE MV Area (PHT): 3.37 cm    SHUNTS MV Decel Time: 225 msec    Systemic VTI:  0.24 m MV E velocity: 79.70 cm/s  Systemic Diam: 1.80 cm MV A velocity: 63.80 cm/s MV E/A ratio:  1.25 Olga Millers MD Electronically signed by Olga Millers MD Signature Date/Time: 01/24/2021/4:20:42 PM    Final    VAS US CAROTID  Result Date: 01/23/2021 Carotid Arterial Duplex Study Patient Name:  Adam Mcclain  Date of Exam:   01/23/2021 Medical Rec #: [id number]      Accession #:    [id number] Date of Birth: 1950-09-25      Patient Gender: M Patient Age:   69Y Exam Location:  [facility] Procedure:      VAS US CAROTID Referring Phys: [id number] [facility] --------------------------------------------------------------------------------  Indications:       CVA. Risk Factors:      Hypertension, hyperlipidemia, current smoker, prior CVA,                    PAD. Other Factors:     Afib, CKD, COPD. Comparison Study:  No previous exams Performing Technologist: Ernestene Mention  Examination Guidelines: A complete evaluation includes B-mode imaging, spectral Doppler, color Doppler, and power Doppler as needed of all accessible portions of each vessel. Bilateral testing is considered an integral part of a complete examination. Limited examinations for reoccurring indications may be performed as  noted.  Right Carotid Findings: +----------+--------+--------+--------+------------------+------------------+           PSV cm/sEDV cm/sStenosisPlaque DescriptionComments           +----------+--------+--------+--------+------------------+------------------+ CCA Prox  58      20                                intimal thickening +----------+--------+--------+--------+------------------+------------------+ CCA Distal627     144     >50%    heterogenous      intimal thickening +----------+--------+--------+--------+------------------+------------------+ ICA Prox  77      16      1-39%   heterogenous      intimal thickening +----------+--------+--------+--------+------------------+------------------+ ICA Distal51      20                                                   +----------+--------+--------+--------+------------------+------------------+ ECA       318     0       >50%    heterogenous                         +----------+--------+--------+--------+------------------+------------------+ +----------+--------+-------+----------------+-------------------+           PSV cm/sEDV cmsDescribe  Arm Pressure (mmHG) +----------+--------+-------+----------------+-------------------+ [id number]            Multiphasic, WNL                    +----------+--------+-------+----------------+-------------------+ +---------+--------+--------+-----------------+ VertebralPSV cm/sEDV cm/sSeverely dampened +---------+--------+--------+-----------------+  Left Carotid Findings: +----------+-------+--------+--------+-----------------------+-----------------+           PSV    EDV cm/sStenosisPlaque Description     Comments                    cm/s                                                            +----------+-------+--------+--------+-----------------------+-----------------+ CCA Prox  97     25                                     intimal                                                                    thickening        +----------+-------+--------+--------+-----------------------+-----------------+ CCA Distal98     24              heterogenous           intimal                                                                   thickening        +----------+-------+--------+--------+-----------------------+-----------------+ ICA Prox  269    69      60-79%  heterogenous and                                                          calcific                                 +----------+-------+--------+--------+-----------------------+-----------------+ ICA Distal125    27                                                       +----------+-------+--------+--------+-----------------------+-----------------+ ECA       564    39      >50%    heterogenous                             +----------+-------+--------+--------+-----------------------+-----------------+ +----------+-------+--------+-------------------------------+------------------+  PSV    EDV cm/sDescribe                       Arm Pressure                 cm/s                                          (mmHG)             +----------+-------+--------+-------------------------------+------------------+ Subclavian71     13      Low resistive, post stenotic                                               flow                                              +----------+-------+--------+-------------------------------+------------------+ +---------+--------+--+--------+--+---------+ VertebralPSV cm/s42EDV cm/s13Antegrade +---------+--------+--+--------+--+---------+   Summary: Right Carotid: Velocities in the right ICA are consistent with a 1-39% stenosis.                Hemodynamically significant plaque 80-99% visualized in the CCA.                The ECA appears >50% stenosed. Left Carotid: Velocities in the left ICA are  consistent with a 60-79% stenosis.               Non-hemodynamically significant plaque <50% noted in the CCA. The               ECA appears >50% stenosed. Vertebrals:  Left vertebral artery demonstrates antegrade flow. Right vertebral              artery shows severely dampened waveforms. Subclavians: Normal flow hemodynamics were seen in the right subclavian artery.              Left subclavian artery shows low resistive, post stenotic              waveforms. *See table(s) above for measurements and observations.  Vascular consult recommended. Electronically signed by Gretta Began MD on 01/23/2021 at 9:43:00 PM.    Final    VAS [place] TRANSCRANIAL DOPPLER  Result Date: 01/25/2021  Transcranial Doppler Patient Name:  Adam Mcclain  Date of Exam:   01/25/2021 Medical Rec #: [id number]      Accession #:    [id number] Date of Birth: 1950-10-21      Patient Gender: M Patient Age:   69Y Exam Location:  [facility] Procedure:      VAS [place] TRANSCRANIAL DOPPLER Referring Phys: [id number] DELILA A BAILEY-MODZIK --------------------------------------------------------------------------------  Indications: Stroke. Comparison Study: no prior Performing Technologist: Blanch Media RVS  Examination Guidelines: A complete evaluation includes B-mode imaging, spectral Doppler, color Doppler, and power Doppler as needed of all accessible portions of each vessel. Bilateral testing is considered an integral part of a complete examination. Limited examinations for reoccurring indications may be performed as noted.  +----------+-------------+----------+-----------+-------+ RIGHT TCD Right VM (cm)Depth (cm)PulsatilityComment +----------+-------------+----------+-----------+-------+ MCA           58.00  1.10            +----------+-------------+----------+-----------+-------+ ACA          -32.00                 0.88            +----------+-------------+----------+-----------+-------+ Term ICA      43.00                  1.01            +----------+-------------+----------+-----------+-------+ PCA           35.00                 0.60            +----------+-------------+----------+-----------+-------+ Opthalmic     11.00                 2.08            +----------+-------------+----------+-----------+-------+ ICA siphon    46.00                 1.27            +----------+-------------+----------+-----------+-------+ Vertebral    -28.00                 0.99            +----------+-------------+----------+-----------+-------+  +----------+------------+----------+-----------+------------------+ LEFT TCD  Left VM (cm)Depth (cm)Pulsatility     Comment       +----------+------------+----------+-----------+------------------+ MCA          20.00                 0.79                       +----------+------------+----------+-----------+------------------+ ACA          -23.00                0.61                       +----------+------------+----------+-----------+------------------+ Term ICA                                   unable to insonate +----------+------------+----------+-----------+------------------+ PCA                                        unable to insonate +----------+------------+----------+-----------+------------------+ Opthalmic    17.00                 1.17                       +----------+------------+----------+-----------+------------------+ ICA siphon   53.00                 1.18                       +----------+------------+----------+-----------+------------------+ Vertebral    -24.00                1.18                       +----------+------------+----------+-----------+------------------+  +------------+-------+-------+             VM cm/sComment +------------+-------+-------+ Dist Basilar-20.00         +------------+-------+-------+  Summary:  Poor left temporal window limits evaluation of anetrior circulation on  left otherwise normal mean flow velocities in majority of identified vessels of anterior and posterior cerebral circulation. *See table(s) above for TCD measurements and observations.  Diagnosing physician: Delia Heady MD Electronically signed by Delia Heady MD on 01/25/2021 at 11:58:40 AM.    Final    US Abdomen Limited RUQ (LIVER/GB)  Result Date: 01/27/2021 CLINICAL DATA:  Hyperbilirubinemia. EXAM: ULTRASOUND ABDOMEN LIMITED RIGHT UPPER QUADRANT COMPARISON:  None. FINDINGS: Gallbladder: Gallbladder is surgically absent. Common bile duct: Diameter: 6.7 millimeter Liver: The liver parenchyma is normal in echogenicity. Numerous hepatic cysts are identified. In the LEFT hepatic lobe, cyst is 3.0 x 3.0 x 4.0 centimeters. A second cyst is 2 x 2 x 2 centimeters. In the RIGHT hepatic lobe, cyst is 4 x 3 x 3 centimeters. No solid or suspicious liver masses. Portal vein is patent on color Doppler imaging with normal direction of blood flow towards the liver. Other: None. IMPRESSION: 1. Multiple hepatic cysts. 2. Cholecystectomy. 3. No biliary duct dilatation identified. Electronically Signed   By: Norva Pavlov M.D.   On: 01/27/2021 13:45     PHYSICAL EXAM  Temp:  [97.5 F (36.4 C)-99.5 F (37.5 C)] 97.5 F (36.4 C) (05/17 0800) Pulse Rate:  [59-81] 63 (05/17 1900) Resp:  [16-31] 16 (05/17 1700) BP: (96-145)/(45-127) 96/45 (05/17 1900) SpO2:  [93 %-100 %] 100 % (05/17 1900) FiO2 (%):  [30 %] 30 % (05/17 1451) Weight:  [60.7 kg] 60.7 kg (05/17 0436)  General - Well nourished, well developed, status post trach.  Ophthalmologic - fundi not visualized due to noncooperation.  Cardiovascular - Regular rate and rhythm.  Neuro - s/p trach on precedex, eyes widely open, awake alert, following simple commands both midline and peripheral. Eyes in mid position, tracking bilaterally, but not blinking to visual threat on the left, inconsistently blinking to visual threat on the right, PERRL. Facial  symmetrical. Tongue protrusion midline. BUE on mittens, 4/5 on there right and 3+/5 on the left, BLEs 4/5 proximal and 3/5 distally. No babinski. Sensation subjectively symmetrical, coordination grossly intact bilaterally and gait not tested.   ASSESSMENT/PLAN Adam Mcclain a 70 year old malewith a PMHx ofCVA, HTN, atrialfibrillation on aspirin and Plavix, COPD, and CKDpresenting with GTC seizures found to have a possible subcentimeter acute right parietal cortical infarct.   Stroke - several punctate acute right parietal cortical infarct, etiology uncertain, AF not Adam vs. Right CCA and M1 high grade stenosis  CT:  No evidence of acute intracranial abnormality. Old left PCA infarct.  MRI  Possible subcentimeter acute right parietal cortical infarcts along the margin of a chronic parieto-occipital infarct. Large chronic left PCA infarct and multiple chronic lacunar infarcts.   TCD unremarkable  Carotid Doppler:  right CCA 80-99%.  Left ICA 60 to 79% stenosis.  Right ABI severely dampened waveforms.  Left subclavian artery low resistant, post stenotic waveforms  CTA head and neck - severe right CCA 80% stenosis. 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 50% right subclavian A stenosis. B/l ICA siphon athero with moderate stenosis. Right M1 short segment severe stenosis.   2D Echo EF 60 to 65%  VTE prophylaxis - heparin 5000 units ppx   No antithrombotics PTA, Plavix was on hold prior to tracheostomy.  Now on ASA 325, plan to transition to eliquis tomorrow  Therapy recommendations: Pending  Disposition:   Pending  A fib not on Adam  A fib history not on anticoagulant but on DAPT prior to admission.   Sinus rhythm currently  Now on ASA 325, plan to transition to eliquis tomorrow  Seizure  No clare history of seizures.  cEEG with Triphasics 2-3 Hz on  ictal-interictal spectrum but no seizures. Also notable for moderate to severe diffuse encephalopathy.   cEEG was disontinued after LTM for more than 48 hours with no seizures.  Continue Keppra  BID IV->PO via cortrak  Carotid stenosis  history of right CEA 2017  Carotid Doppler:  right CCA 80-99%.  Left ICA 60 to 79% stenosis.  Right ABI severely dampened waveforms.  Left subclavian artery low resistant, post stenotic waveforms  CTA head and neck severe right CCA 80% stenosis. Left ICA 50% stenosis with calcified and soft plaques   VVS Dr. Chestine Spore on board - no acute intervention at this time, will follow up in clinic  Left subclavian steal  CTA head and neck - b/l VA occlusion at origins, reconstitution at V2s, left subclavian A occlusion with likely left VA steal.   50% right subclavian A stenosis.   Dr. Chestine Spore [facility]VS on board - no acute intervention at this time, will follow up in clinic  Hypertension  Home meds:  Norvasc 5, Lasix , Zestril    off Levophed  BP stable but on the low end  Avoid low BP  Long-term BP goal 130-150 given severe carotid stenosis  Acute hypoxic resp failure  Aspiration PNA  Management per CCM, appreciated  Self -extubated 5/13 -> reintubated  +Moraxella catarrhalis on resp culture.   CCM planning IV Unasyn x 7 days.   Still has copious thick secretions  S/p trach 5/16  Hyperlipidemia  Home meds:  Lipitor    LDL 43, goal < 70  LFT elevated, AST/ALT 56/54-> 65/93->38/66  Lipitor resumed  Continue statin at discharge  Dysphagia  NPO  Speech on board  On TF /hr  Diabetes type II Controlled  HgbA1c 5.3, goal < 7.0  CBGs  SSI  Close outpatient PCP follow-up  Other Stroke Risk Factors  Advanced Age >/= 16   History of stroke Large chronic left PCA infarct and multiple chronic lacunar infarcts.  Other Active Problems  Hypokalemia, K3.1->3.6->3.9->3.3->4.7->4.2  Leukocytosis WBC  7.9->14.6->9.2->10.5->11.5  AKI Cre 1.23->1.16->1.29->1.39  Hospital day # 11  This patient is critically ill due to bilateral carotid stenosis, seizure, subclavian steal, respiratory failure status post tracheostomy, PAF not on Adam and at significant risk of neurological worsening, death form recurrent stroke, hemorrhagic conversion, status epilepticus, renal failure heart failure. This patient's care requires constant monitoring of vital signs, hemodynamics, respiratory and cardiac monitoring, review of multiple databases, neurological assessment, discussion with family, other specialists and medical decision making of high complexity. I spent 35 minutes of neurocritical care time in the care of this patient.  I discussed with Dr. Merrily Pew CCM  Marvel Plan, MD PhD Stroke Neurology 01/30/2021 8:01 PM    To contact Stroke Continuity provider, please refer to [email or web address]. After hours, contact General Neurology

## 2021-01-30 NOTE — Progress Notes (Addendum)
NAME:  Adam Mcclain, MRN:  [id number], DOB:  October 23, 1950, LOS: 11 ADMISSION DATE:  01/19/2021, CONSULTATION DATE: 5/6 REFERRING MD: Dr. Rubin Payor, CHIEF COMPLAINT: Seizure  History of Present Illness:  70 year old male with past medical history as below, which is significant for CVA, HTN, afibrillation on aspirin and Plavix, COPD, and CKD.  In the midday hours of 5/6 he had accompanied by family member to a doctor's appointment.  He was waiting in the car when a bystander walked by and noticed he had a gaze deviation and was shaking.  EMS was called.  Upon EMS arrival the patient was unresponsive and was administered 10 mg of Versed.  He was bagged in route to the emergency department where he was promptly intubated on presentation.  The ED physician did note some rigidity in the upper extremities concerning for seizure activity.  CT of the head was nonacute.  The patient was evaluated by neurology who provided a loading dose of Keppra and started EEG monitoring.  PCCM was asked to admit the patient due to the requirement of mechanical ventilation.  Significant Hospital Events: Including procedures, antibiotic start and stop dates in addition to other pertinent events   . 5/6 admit to [facility] for seizures on the ventilator. . 5/7 EEG shows no seizure activity . 5/8 propofol was turned off, patient is spiked fever but white count trended down . 5/9 EEG was negative for seizures, MRI was done showed very small right parietal stroke . 5/10 trial of extubation, patient is struggled had to be reintubated . 5/11 MRI brain showed subcentimeter new stroke . 5/12 tolerated pressure support, extubated and had to be reintubated because of respiratory distress . 5/13 patient was a scheduled for tracheostomy but he self extubated early this morning at shift change . 5/14 reintubated overnight for uncontrolled WOB and inability to manage secretions. Plavix stopped in prep for trach. . Became bradycardic while  on Precedex, dose was decreased and he was started on Versed . 5/16 tracheostomy was uneventful  Interim History / Subjective:  The patient does not feel well. He felt weak.  Objective   Blood pressure 105/65, pulse 60, temperature (!) 97.5 F (36.4 C), temperature source Axillary, resp. rate 18, weight 60.7 kg, SpO2 100 %.    Vent Mode: PRVC FiO2 (%):  [30 %-40 %] 30 % Set Rate:  [18 bmp] 18 bmp Vt Set:  [580 mL] 580 mL PEEP:  [5 cmH20] 5 cmH20 Pressure Support:  [8 cmH20] 8 cmH20 Plateau Pressure:  [17 cmH20-19 cmH20] 19 cmH20   Intake/Output Summary (Last 24 hours) at 01/30/2021 1048 Last data filed at 01/30/2021 0800 Gross per 24 hour  Intake 3030.31 ml  Output 1450 ml  Net 1580.31 ml   Filed Weights   01/28/21 0400 01/29/21 0400 01/30/21 0436  Weight: 63 kg 60.7 kg 60.7 kg    Examination: General: critically il appearing man lying in bed, on tracheostomy HENT: [place]/AT, eyes anicteric, moist mucous membranes Lungs: inspiratory rhonchi, thick secretions from ETT but clear Cardiovascular: S1S2, RRR Abdomen: soft, NT Extremities: no clubbing, no cyanosis, and no edema Neuro: RASS 0, moving all extremities on command against gravity, GCS 14T Skin: no rashes, warm & dry  Labs/imaging that I have personally reviewed  (right click and "Reselect all SmartList Selections" daily)  Na 141 K 4.2 Cl 110 Cr 1.39   WBC trends up from 10.5-> 11.5 (likely due to tracheostomy yesterday) Hgb trends down from 12.2 (5/16) -> 10.7 (5/17)  5/9 Respiratory culture: Grew Moraxella catarrhalis, B- lactamase + RUQ [place] 5/14> no cystic duct dilation, prev cholecystectomy. Multiple hepatic cysts 5/15: CTA neck Severe 80% right CCA stenosis, 50% atheromatous stenosis at the left carotid bifurcation, 50% proximal right subclavian artery stenosis. 5/16 CXR shows mild bibasilar subsegmental atelectasis. Minimal right pleural effusion.  Resolved Hospital Problem list   AKI on CKD stage III  A Hypokalemia/hypophosphatemia- resolved  Assessment & Plan:  New onset Seizure from previous L PCA infarction Acute right parietal stroke - EEG was negative - Stop keppra, appreciate neurology's recommendations - May need TEE prior to discharge to assess for LAA thrombus - Con't ASA higher dose to 25 mg, plavix on hold; start Eliquis tomorrow (5/18), Atorvastatin daily. - Con't to hold wellbutrin, probably should not be restarted due to seizure risk  Acute hypoxemic respiratory failure in the setting of seizure and aspiration pneumonia with Moraxella catarrhalis. Failed extubated x 3 now. - S/P tracheostomy - On seroquel for agitation - Stop Precedex/midazolam - Completed 7-day IV Unasyn  Bilateral carotid stenosis - Carotid vascular ultrasound showed severe right common carotid artery stenosis 80%, moderate to moderate stenosis on the left common carotid 50% - Appreciate vascular consult  - For neurology to determine the etiology of stroke - Once stable, he will need IR guided ICA stent - aspirin ok, but plavix on hold; for Eliquis tomorrow, d/w neurology  Paroxysmal atrial fibrillation: Not on AC HTN - Holding home antihypertensives including amlodipine, HCTZ and lisinopril - Start Eliquis tomorrow (5/18)  Increased creatinine - creat trending up from 1.16> 1.29> 1.39 (5/17)   - BUN trends down from 28 (5/16) to 26 (5/17) - trend BUN and creat - renally dose medication - avoid nephrotoxins  Anemia (normocytic, normochromic) - Hb trends down from 12.2 (5/16) to 10/7 (5/17) - likely due to blood loss from procedure - trend CBC - blood transfusion if Hb < 7  Leukocytosis - WBC trends up 10.5>11/5  - afebrile; likely due to tracheostomy yesterday - trend CBC  Bibasilar atelectasis - encourage deep breathing - incentive spirometry  Hypertriglyceridemia - Continue atorvastatin - Avoid Cleviprex & propofol   COPD  - history documented.  - No associated home meds  listed.  - No evidence of acute exacerbation.  - PRN Duonebs; if needing frequently can add long-acting BDs  Best practice (right click and "Reselect all SmartList Selections" daily)  Diet:  Tube Feed .  Pain/Anxiety/Delirium protocol (if indicated): yes VAP protocol (if indicated): yes DVT prophylaxis: Subcutaneous Heparin GI prophylaxis: PPI Glucose control:  SSI Yes Central venous access:  N/A Arterial line:  N/A Foley:  N/A Mobility: Out of bed to chair PT consulted: Yes Last date of multidisciplinary goals of care discussion [5/14: trach planned after plavix washout; d/w brother [name] at bedside with RN present  Code Status:  full code Disposition: ICU

## 2021-01-30 NOTE — Progress Notes (Signed)
[facility]  Subjective  -underwent trach yesterday with critical care. no additional neurologic events overnight .   Objective 101/62 (!) 59 99.5 F (37.5 C) (Axillary) 18 99%  Intake/Output Summary (Last 24 hours) at 01/30/2021 0647 Last data filed at 01/30/2021 0600 Gross per 24 hour  Intake 3110.44 ml  Output 1450 ml  Net 1660.44 ml    Remains in ICU with trach in place Appears to be grossly moving all extremities  Laboratory Lab Results: Recent Labs    01/29/21 0116 01/30/21 0157  WBC 10.5 11.5*  HGB 12.2* 10.7*  HCT 37.8* 32.5*  PLT 239 235   BMET Recent Labs    01/29/21 0116 01/30/21 0157  NA 142 141  K 4.7 4.2  CL 110 110  CO2 23 26  GLUCOSE 132* 146*  BUN 28* 26*  CREATININE 1.29* 1.39*  CALCIUM 8.1* 7.9*    COAG No results found for: INR, PROTIME No results found for: PTT  Assessment/Planning:  70 year old male admitted with questionable generalized seizure and then found to have acute right parietal cortical infarcts.  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  I have reviewed the CTA and on the side of his stroke he has no significant carotid bifurcation disease in the ICA and agree he has about an 80% mid common carotid stenosis.  I feel the etiology for common carotid lesions is less well understood and he has multiple other underlying risk factors for his stroke including hx of A. fib not on anticoagulation as well as significant intracranial disease.  I would defer to neurology for their input if they feel strongly that the common carotid lesion is the likely etiology for his stroke otherwise would likely plan anticoagulation and let him get better  clinically.  Cephus Shelling 01/30/2021 6:47 AM --

## 2021-01-30 NOTE — Progress Notes (Signed)
SLP Cancellation Note  Patient Details Name: Adam Mcclain MRN: [id number] DOB: 05-Dec-1950   Cancelled treatment:       Reason Eval/Treat Not Completed: Patient not medically ready Patient with new tracheostomy. Orders for SLP eval and treat for PMSV and swallowing received. Will follow pt closely for readiness for SLP interventions as appropriate.     Burnice Oestreicher, Riley Nearing 01/30/2021, 7:44 AM

## 2021-01-30 NOTE — Progress Notes (Signed)
Nutrition Follow-up  DOCUMENTATION CODES:   Severe malnutrition in context of chronic illness  INTERVENTION:   Tube feeding via Cortrak tube: Osmolite 1.5 at 55 ml/h (1320 ml per day) Prosource TF 45 ml BID  Provides 2060 kcal, 104 gm protein, 1003 ml free water daily  MVI with minerals daily per tube  NUTRITION DIAGNOSIS:   Severe Malnutrition related to chronic illness (COPD, CKD) as evidenced by severe muscle depletion,mild fat depletion. Ongoing.   GOAL:   Patient will meet greater than or equal to 90% of their needs Met with TF.   MONITOR:   TF tolerance,Diet advancement  REASON FOR ASSESSMENT:   Ventilator,Consult Enteral/tube feeding initiation and management  ASSESSMENT:   70 yo male admitted with new onset seizures, acute respiratory failure requiring intubation.PMH includes stroke, HTN, GERD, afib, COPD, CKD  Pt discussed during ICU rounds and with RN. Per SLP not ready for PMSV or swallow study yet. Vascular following for carotid stenosis.   5/10 failed extubation 5/12 failed extubation 5/13 self-extubated; cortrak placed with tip gastric  5/14 re-intubated  5/16 s/p trach  Medications reviewed and include: folic acid, SSI, MVI with minerals, thiamine, vitamin B12 Precedex  Labs reviewed: CBG's: 103-147  I&O: + 12 L   Diet Order:   Diet Order            Diet NPO time specified  Diet effective now                 EDUCATION NEEDS:   No education needs have been identified at this time  Skin:  Skin Assessment: Skin Integrity Issues: Skin Integrity Issues:: Stage I Stage I: 2 places on forehead  Last BM:  100 ml via rectal tube  Height:   Ht Readings from Last 1 Encounters:  No data found for Ht    Weight:   Wt Readings from Last 1 Encounters:  01/30/21 60.7 kg    Ideal Body Weight:     BMI:  There is no height or weight on file to calculate BMI.  Estimated Nutritional Needs:   Kcal:  1800-2000  Protein:  95-115  grams  Fluid:  >/= 2L  Elianie Hubers P., RD, LDN, CNSC See AMiON for contact information

## 2021-01-30 NOTE — Progress Notes (Signed)
Orthopedic Tech Progress Note Patient Details:  Adam Mcclain March 20, 1951 [id number]  Ortho Devices Type of Ortho Device: Prafo boot/shoe Ortho Device/Splint Location: LLE Ortho Device/Splint Interventions: Ordered,Application   Post Interventions Patient Tolerated: Well Instructions Provided: Care of device   Donald Pore 01/30/2021, 11:10 AM

## 2021-01-30 NOTE — Evaluation (Signed)
Occupational Therapy Evaluation Patient Details Name: Adam Mcclain MRN: [id number] DOB: 11-04-1950 Today's Date: 01/30/2021    History of Present Illness 70 y.o. male admitted on 01/19/21 for seizure.  Found to have several punctate acute right parietal cortical infarcts, etiology unclear (possibly a fib no on anticoagulant vs R CCA high grade stenosis).  He was intubated in the ED, extubated on 5/10, but had to be re-intubated due to vomiting and aspirating, extubated again on 5/12, but had to be re-intubated later that day.  Pt then self-extubated on 5/13 trach 5/16 PMH of stroke, PAD, HTN, nephrolithiasis, A-fib.   Clinical Impression   PT admitted with CVA. Pt currently with functional limitiations due to the deficits listed below (see OT problem list). Pt currently on vent with trach at this time. NO family present for baseline. Pt was able to answer yes/ no questions with head nod with 75% accuracy this session. Pt s facial expression to education that he was hospitalized appeared to demonstrate lack of awareness. Pt correctly answered to personal information name / dob. Pt attempting to mouth answers this session. Pt completed sit<>Stand at EOB and progressing well with therapy.  Pt will benefit from skilled OT to increase their independence and safety with adls and balance to allow discharge CIR.    Follow Up Recommendations  CIR    Equipment Recommendations  3 in 1 bedside commode;Other (comment) (RW)    Recommendations for Other Services Rehab consult     Precautions / Restrictions Precautions Precautions: Fall Precaution Comments: L attention/ mitten, trach. flexiseal, condom foley, posey bed alarm Required Braces or Orthoses: Other Brace (L prafo requested by PT 5/17)      Mobility Bed Mobility Overal bed mobility: Needs Assistance Bed Mobility: Rolling;Supine to Sit;Sit to Supine Rolling: Mod assist   Supine to sit: Mod assist Sit to supine: +2 for physical  assistance;Mod assist   General bed mobility comments: mod cues for sequence and pushing up on the L side of the bed for EOB sitting. pt sitting with Min to mod (A) during session    Transfers Overall transfer level: Needs assistance Equipment used: 2 person hand held assist Transfers: Sit to/from Stand Sit to Stand: +2 physical assistance;Mod assist         General transfer comment: Pt initiated task with counting but unable to sustain. pt with flexion of neck hips and kness. Pt completed x2 sit<>stand    Balance Overall balance assessment: Needs assistance Sitting-balance support: Bilateral upper extremity supported;Feet supported Sitting balance-Leahy Scale: Fair Sitting balance - Comments: able to extend neck on commands but unable to sustain Postural control: Left lateral lean Standing balance support: Bilateral upper extremity supported Standing balance-Leahy Scale: Poor Standing balance comment: reliant on external support                           ADL either performed or assessed with clinical judgement   ADL Overall ADL's : Needs assistance/impaired Eating/Feeding: NPO   Grooming: Moderate assistance;Sitting Grooming Details (indicate cue type and reason): wash face with R UE Upper Body Bathing: Maximal assistance   Lower Body Bathing: Total assistance   Upper Body Dressing : Maximal assistance   Lower Body Dressing: Total assistance   Toilet Transfer: Maximal assistance;+2 for physical assistance;+2 for safety/equipment;BSC Toilet Transfer Details (indicate cue type and reason): simulated with EOB transfers Toileting- Clothing Manipulation and Hygiene: Total assistance         General ADL  Comments: pt initiated all bed transfers and sit<>Stand on command. pt unable to sustain initiation without physical (A) of therapists     Vision   Additional Comments: inattention to the L side. Pt tracking therapist on R side. Ot to further assess vision      Perception     Praxis      Pertinent Vitals/Pain Pain Assessment: Faces Faces Pain Scale: Hurts a little bit Pain Location: generalized Pain Descriptors / Indicators: Restless Pain Intervention(s): Monitored during session;Repositioned     Hand Dominance Right   Extremity/Trunk Assessment Upper Extremity Assessment Upper Extremity Assessment: Generalized weakness;LUE deficits/detail LUE Deficits / Details: LUE weaker than R UE. pt able to demonstrate shoulder flexion to 45 degrees LUE Coordination: decreased fine motor   Lower Extremity Assessment Lower Extremity Assessment: Defer to PT evaluation   Cervical / Trunk Assessment Cervical / Trunk Assessment: Other exceptions Cervical / Trunk Exceptions: neck rotation toward R, ribs collapsed on R and flared on L due to L side weakness. pt with forward head and rounded shoulders   Communication Communication Communication: Tracheostomy   Cognition Arousal/Alertness: Awake/alert Behavior During Therapy: Restless Overall Cognitive Status: No family/caregiver present to determine baseline cognitive functioning Area of Impairment: Orientation;Attention;Memory;Following commands                 Orientation Level: Disoriented to;Place;Time;Situation Current Attention Level: Sustained Memory: Decreased recall of precautions Following Commands: Follows one step commands with increased time;Follows one step commands inconsistently       General Comments: Pt was able to correctly nod head yes to name/ date of birth mouthed May. pt correctly responds to simple commands like lets put on your sock by lifting foot. pt makes a shocked face when informed that he is in hospital. pt did not seem aware of this information   General Comments  vent peep 5 FIO2 30%    Exercises     Shoulder Instructions      Home Living Family/patient expects to be discharged to:: Other (Comment)                                  Additional Comments: No family present and unable to gather from patient due to vent at this time. Pt able to mouth several times but can be difficult to understand      Prior Functioning/Environment          Comments: unknown        OT Problem List: Decreased strength;Decreased activity tolerance;Impaired balance (sitting and/or standing);Decreased safety awareness;Decreased knowledge of use of DME or AE;Decreased knowledge of precautions;Cardiopulmonary status limiting activity;Impaired UE functional use;Impaired vision/perception;Decreased cognition      OT Treatment/Interventions: Self-care/ADL training;Therapeutic exercise;Neuromuscular education;Energy conservation;DME and/or AE instruction;Manual therapy;Therapeutic activities;Cognitive remediation/compensation;Visual/perceptual remediation/compensation;Patient/family education;Balance training    OT Goals(Current goals can be found in the care plan section) Acute Rehab OT Goals Patient Stated Goal: agreeable to EOB OT Goal Formulation: Patient unable to participate in goal setting Time For Goal Achievement: 02/13/21 Potential to Achieve Goals: Good  OT Frequency: Min 2X/week   Barriers to D/C:    unknown no family present       Co-evaluation PT/OT/SLP Co-Evaluation/Treatment: Yes Reason for Co-Treatment: Complexity of the patient's impairments (multi-system involvement);Necessary to address cognition/behavior during functional activity;For patient/therapist safety;To address functional/ADL transfers   OT goals addressed during session: ADL's and self-care;Proper use of Adaptive equipment and DME;Strengthening/ROM      AM-PAC  OT "6 Clicks" Daily Activity     Outcome Measure Help from another person eating meals?: A Little Help from another person taking care of personal grooming?: A Little Help from another person toileting, which includes using toliet, bedpan, or urinal?: A Lot Help from another person bathing  (including washing, rinsing, drying)?: A Lot Help from another person to put on and taking off regular upper body clothing?: A Lot Help from another person to put on and taking off regular lower body clothing?: A Lot 6 Click Score: 14   End of Session Equipment Utilized During Treatment: Gait belt;Oxygen Nurse Communication: Mobility status;Precautions  Activity Tolerance: Patient tolerated treatment well Patient left: in bed;with call bell/phone within reach;with bed alarm set;with restraints reapplied;with SCD's reapplied (bed posey belt)  OT Visit Diagnosis: Unsteadiness on feet (R26.81);Muscle weakness (generalized) (M62.81)                Time: 4270-6237 OT Time Calculation (min): 29 min Charges:  OT General Charges $OT Visit: 1 Visit OT Evaluation $OT Eval Moderate Complexity: 1 Mod   Brynn, OTR/L  Acute Rehabilitation Services Pager: [phone] Office: [phone] .   Mateo Flow 01/30/2021, 9:43 AM

## 2021-01-31 DIAGNOSIS — I6521 Occlusion and stenosis of right carotid artery: Secondary | ICD-10-CM

## 2021-01-31 DIAGNOSIS — I6330 Cerebral infarction due to thrombosis of unspecified cerebral artery: Secondary | ICD-10-CM | POA: Diagnosis not present

## 2021-01-31 DIAGNOSIS — G458 Other transient cerebral ischemic attacks and related syndromes: Secondary | ICD-10-CM | POA: Diagnosis not present

## 2021-01-31 DIAGNOSIS — R569 Unspecified convulsions: Secondary | ICD-10-CM | POA: Diagnosis not present

## 2021-01-31 LAB — BASIC METABOLIC PANEL
Anion gap: 6 (ref 5–15)
BUN: 24 mg/dL — ABNORMAL HIGH (ref 8–23)
CO2: 24 mmol/L (ref 22–32)
Calcium: 8.2 mg/dL — ABNORMAL LOW (ref 8.9–10.3)
Chloride: 109 mmol/L (ref 98–111)
Creatinine, Ser: 1.32 mg/dL — ABNORMAL HIGH (ref 0.61–1.24)
GFR, Estimated: 58 mL/min — ABNORMAL LOW (ref >60)
Glucose, Bld: 117 mg/dL — ABNORMAL HIGH (ref 70–99)
Potassium: 4.1 mmol/L (ref 3.5–5.1)
Sodium: 139 mmol/L (ref 135–145)

## 2021-01-31 LAB — GLUCOSE, CAPILLARY
Glucose-Capillary: 108 mg/dL — ABNORMAL HIGH (ref 70–99)
Glucose-Capillary: 113 mg/dL — ABNORMAL HIGH (ref 70–99)
Glucose-Capillary: 116 mg/dL — ABNORMAL HIGH (ref 70–99)
Glucose-Capillary: 118 mg/dL — ABNORMAL HIGH (ref 70–99)
Glucose-Capillary: 129 mg/dL — ABNORMAL HIGH (ref 70–99)
Glucose-Capillary: 143 mg/dL — ABNORMAL HIGH (ref 70–99)

## 2021-01-31 LAB — CBC
HCT: 32.2 % — ABNORMAL LOW (ref 39.0–52.0)
Hemoglobin: 10.4 g/dL — ABNORMAL LOW (ref 13.0–17.0)
MCH: 31.6 pg (ref 26.0–34.0)
MCHC: 32.3 g/dL (ref 30.0–36.0)
MCV: 97.9 fL (ref 80.0–100.0)
Platelets: 255 10*3/uL (ref 150–400)
RBC: 3.29 MIL/uL — ABNORMAL LOW (ref 4.22–5.81)
RDW: 13.5 % (ref 11.5–15.5)
WBC: 13 10*3/uL — ABNORMAL HIGH (ref 4.0–10.5)
nRBC: 0 % (ref 0.0–0.2)

## 2021-01-31 MED ORDER — APIXABAN 5 MG PO TABS
5.0000 mg | ORAL_TABLET | Freq: Two times a day (BID) | ORAL | Status: DC
  Filled 2021-01-31: qty 1

## 2021-01-31 MED ORDER — APIXABAN 5 MG PO TABS
5.0000 mg | ORAL_TABLET | Freq: Two times a day (BID) | ORAL | Status: DC
  Administered 2021-01-31 – 2021-02-02 (×5): 5 mg
  Filled 2021-01-31 (×4): qty 1

## 2021-01-31 MED ORDER — DEXMEDETOMIDINE HCL IN NACL 400 MCG/100ML IV SOLN
0.2000 ug/kg/h | INTRAVENOUS | Status: DC
  Administered 2021-01-31: 0.5 ug/kg/h via INTRAVENOUS
  Filled 2021-01-31 (×2): qty 100

## 2021-01-31 MED ORDER — AMLODIPINE BESYLATE 10 MG PO TABS
10.0000 mg | ORAL_TABLET | Freq: Every day | ORAL | Status: DC
  Administered 2021-01-31 – 2021-02-02 (×3): 10 mg
  Filled 2021-01-31 (×3): qty 1

## 2021-01-31 MED ORDER — ASPIRIN 81 MG PO CHEW
81.0000 mg | CHEWABLE_TABLET | Freq: Every day | ORAL | Status: DC
  Administered 2021-01-31 – 2021-02-02 (×3): 81 mg
  Filled 2021-01-31 (×3): qty 1

## 2021-01-31 MED ORDER — ASPIRIN 81 MG PO CHEW: 81.0000 mg | CHEWABLE_TABLET | Freq: Every day | ORAL | Status: DC

## 2021-01-31 NOTE — Progress Notes (Signed)
Occupational Therapy Treatment Patient Details Name: Adam Mcclain MRN: [id number] DOB: 03/13/1951 Today's Date: 01/31/2021    History of present illness 70 y.o. male admitted on 01/19/21 for seizure.  Found to have several punctate acute right parietal cortical infarcts, etiology unclear (possibly a fib no on anticoagulant vs R CCA high grade stenosis).  He was intubated in the ED, extubated on 5/10, but had to be re-intubated due to vomiting and aspirating, extubated again on 5/12, but had to be re-intubated later that day.  Pt then self-extubated on 5/13 trach 5/16 PMH of stroke, PAD, HTN, nephrolithiasis, A-fib.   OT comments  Pt progressed from bed to chair this session mod total+2 with decreased visual attention to L side.pt needs cues in single step commands to progress this session. Pt tolerating upright in chair well. Pt producing secretions throughout session with good oxygen saturations noted on trach collar. Recommendation CIR at this time.    Follow Up Recommendations  CIR    Equipment Recommendations  3 in 1 bedside commode;Other (comment)    Recommendations for Other Services Rehab consult    Precautions / Restrictions Precautions Precautions: Fall Precaution Comments: L inattention / bil mittents cortrek trach collar       Mobility Bed Mobility Overal bed mobility: Needs Assistance Bed Mobility: Rolling;Supine to Sit;Sit to Supine Rolling: +2 for physical assistance;Mod assist   Supine to sit: Mod assist;+2 for physical assistance     General bed mobility comments: pt requires cues to move to the R side of bed. pt fixate on the exit on the left side of the bed with BIL LE. pt needs hand over hand facilitation. pt pushing into extension attempting to follow command to sit up. pt static sitting min (A)    Transfers Overall transfer level: Needs assistance Equipment used: 2 person hand held assist Transfers: Sit to/from Stand Sit to Stand: +2 physical  assistance;Mod assist         General transfer comment: stand pivot to chair on L side. pt taking 3 small steps before pivot. pt with decr control for transfer    Balance Overall balance assessment: Needs assistance Sitting-balance support: Bilateral upper extremity supported;Feet supported Sitting balance-Leahy Scale: Fair                                     ADL either performed or assessed with clinical judgement   ADL Overall ADL's : Needs assistance/impaired                     Lower Body Dressing: Total assistance Lower Body Dressing Details (indicate cue type and reason): lifting BIL LE alternating to have socks don appropriatel Toilet Transfer: Maximal assistance;+2 for physical assistance;Stand-pivot Toilet Transfer Details (indicate cue type and reason): simualted EOB to chair           General ADL Comments: pt reports liking being in the chair     Vision   Additional Comments: reports diplopia with questioning but inconsistent with responses to be further assessed pt answer yes diplopia with one eye occlusion   Perception     Praxis      Cognition Arousal/Alertness: Awake/alert Behavior During Therapy: Restless Overall Cognitive Status: No family/caregiver present to determine baseline cognitive functioning Area of Impairment: Following commands;Awareness                   Current Attention Level: Sustained  Memory: Decreased recall of precautions Following Commands: Follows one step commands with increased time;Follows one step commands inconsistently       General Comments: pt restless and responding to name call appropriate. pt does attempt to mouth words a few times during session. pt responding yes to all questions during this session. pt rubbing head with R hand        Exercises     Shoulder Instructions       General Comments trach collar at this time with multiple secretions. RN deep suction once but pt  producing secretions better on his own    Pertinent Vitals/ Pain       Pain Assessment: Faces Faces Pain Scale: Hurts little more Pain Location: generalized holding head Pain Descriptors / Indicators: Headache Pain Intervention(s): Monitored during session;Repositioned  Home Living                                          Prior Functioning/Environment              Frequency  Min 2X/week        Progress Toward Goals  OT Goals(current goals can now be found in the care plan section)  Progress towards OT goals: Progressing toward goals  Acute Rehab OT Goals Patient Stated Goal: to get up OT Goal Formulation: Patient unable to participate in goal setting Time For Goal Achievement: 02/13/21 Potential to Achieve Goals: Good ADL Goals Pt Will Perform Grooming: with min assist;sitting Pt Will Transfer to Toilet: with mod assist;stand pivot transfer;bedside commode Additional ADL Goal #1: pt will follow 2 step command 50% of session Additional ADL Goal #2: pt will complete visual scanning worksheet with less than 2 errors ( L side errors being the focus)  Plan Discharge plan remains appropriate    Co-evaluation    PT/OT/SLP Co-Evaluation/Treatment: Yes Reason for Co-Treatment: Complexity of the patient's impairments (multi-system involvement);Necessary to address cognition/behavior during functional activity;For patient/therapist safety;To address functional/ADL transfers   OT goals addressed during session: ADL's and self-care;Proper use of Adaptive equipment and DME;Strengthening/ROM      AM-PAC OT "6 Clicks" Daily Activity     Outcome Measure   Help from another person eating meals?: A Little Help from another person taking care of personal grooming?: A Little Help from another person toileting, which includes using toliet, bedpan, or urinal?: A Lot Help from another person bathing (including washing, rinsing, drying)?: A Lot Help from another  person to put on and taking off regular upper body clothing?: A Lot Help from another person to put on and taking off regular lower body clothing?: A Lot 6 Click Score: 14    End of Session Equipment Utilized During Treatment: Gait belt;Oxygen;Other (comment) (posey belt RN in room)  OT Visit Diagnosis: Unsteadiness on feet (R26.81);Muscle weakness (generalized) (M62.81)   Activity Tolerance Patient tolerated treatment well   Patient Left in chair;with call bell/phone within reach;Other (comment) (posey belt)   Nurse Communication Mobility status;Precautions        Time: 4401-0272 OT Time Calculation (min): 26 min  Charges: OT General Charges $OT Visit: 1 Visit OT Treatments $Self Care/Home Management : 8-22 mins   Brynn, OTR/L  Acute Rehabilitation Services Pager: [phone] Office: [phone] .    Mateo Flow 01/31/2021, 2:04 PM

## 2021-01-31 NOTE — Progress Notes (Signed)
eLink Physician-Brief Progress Note Patient Name: Adam Mcclain DOB: 06-Dec-1950 MRN: [id number]   Date of Service  01/31/2021  HPI/Events of Note  Patient with agitated delirium resulting in high fall risk. He keeps trying to get of of bed.  eICU Interventions  Posey belt restraint ordered.        [name] 01/31/2021, 5:35 AM

## 2021-01-31 NOTE — Progress Notes (Addendum)
NAME:  Adam Mcclain, MRN:  [id number], DOB:  11/28/1950, LOS: 12 ADMISSION DATE:  01/19/2021, CONSULTATION DATE: 5/6 REFERRING MD: Dr. Rubin Payor, CHIEF COMPLAINT: Seizure  History of Present Illness:  70 year old male with past medical history as below, which is significant for CVA, HTN, afibrillation on aspirin and Plavix, COPD, and CKD.  In the midday hours of 5/6 he had accompanied by family member to a doctor's appointment.  He was waiting in the car when a bystander walked by and noticed he had a gaze deviation and was shaking.  EMS was called.  Upon EMS arrival the patient was unresponsive and was administered 10 mg of Versed.  He was bagged in route to the emergency department where he was promptly intubated on presentation.  The ED physician did note some rigidity in the upper extremities concerning for seizure activity.  CT of the head was nonacute.  The patient was evaluated by neurology who provided a loading dose of Keppra and started EEG monitoring.  PCCM was asked to admit the patient due to the requirement of mechanical ventilation.  Significant Hospital Events: Including procedures, antibiotic start and stop dates in addition to other pertinent events   . 5/6 admit to [facility] for seizures on the ventilator. . 5/7 EEG shows no seizure activity . 5/8 propofol was turned off, patient is spiked fever but white count trended down . 5/9 EEG was negative for seizures, MRI was done showed very small right parietal stroke . 5/10 trial of extubation, patient is struggled had to be reintubated . 5/11 MRI brain showed subcentimeter new stroke . 5/12 tolerated pressure support, extubated and had to be reintubated because of respiratory distress . 5/13 patient was a scheduled for tracheostomy but he self extubated early this morning at shift change . 5/14 reintubated overnight for uncontrolled WOB and inability to manage secretions. Plavix stopped in prep for trach. . Became bradycardic while  on Precedex, dose was decreased and he was started on Versed . 5/16 tracheostomy was uneventful  Interim History / Subjective:  The patient does not feel okay.   Objective   Blood pressure (!) 159/103, pulse 84, temperature (!) 101.4 F (38.6 C), temperature source Axillary, resp. rate 17, height 5\' 2"  (1.575 m), weight 60.7 kg, SpO2 100 %.    Vent Mode: PRVC FiO2 (%):  [30 %] 30 % Set Rate:  [18 bmp] 18 bmp Vt Set:  [430 mL-580 mL] 430 mL PEEP:  [5 cmH20] 5 cmH20 Pressure Support:  [10 cmH20] 10 cmH20 Plateau Pressure:  [15 cmH20-18 cmH20] 16 cmH20   Intake/Output Summary (Last 24 hours) at 01/31/2021 0720 Last data filed at 01/31/2021 0700 Gross per 24 hour  Intake 1692.17 ml  Output 2275 ml  Net -582.83 ml   Filed Weights   01/29/21 0400 01/30/21 0436 01/31/21 0500  Weight: 60.7 kg 60.7 kg 60.7 kg    Examination: General: critically il appearing man lying in bed, tracheostomy on ventilators and NG HENT: [place]/AT, eyes anicteric, moist mucous membranes Lungs: inspiratory rhonchi, thick secretions from ETT Cardiovascular: S1S2, RRR Abdomen: soft, NT Extremities: no clubbing, no cyanosis, and no edema Neuro: RASS +1, GCS 14T Skin: no rashes, warm & dry  Labs/imaging that I have personally reviewed  (right click and "Reselect all SmartList Selections" daily)  BUN 24 Creatinine 1.32 WBC trends up from 11.5> 13 Hgb trends down from 10.7> 13  5/9 Respiratory culture: Grew Moraxella catarrhalis, B- lactamase + RUQ 7/9 5/14> no cystic duct dilation,  prev cholecystectomy. Multiple hepatic cysts 5/15: CTA neck Severe 80% right CCA stenosis, 50% atheromatous stenosis at the left carotid bifurcation, 50% proximal right subclavian artery stenosis. 5/16 CXR shows mild bibasilar subsegmental atelectasis. Minimal right pleural effusion.  Resolved Hospital Problem list   AKI on CKD stage III A Hypokalemia/hypophosphatemia- resolved  Assessment & Plan:  New onset Seizure from  previous L PCA infarction Acute right parietal stroke - EEG was negative - Con't keppra, appreciate neurology's recommendations - May need TEE prior to discharge to assess for LAA thrombus - plavix on hold, transition from aspirin to Eliquis today for secondary stroke prevention - Atorvastatin daily. - Con't to hold wellbutrin, probably should not be restarted due to seizure risk - trend blood glucose  Acute hypoxemic respiratory failure S/P tracheostomy on ventilators  - trach collar today  - in the setting of seizure and aspiration pneumonia with Moraxella catarrhalis.  - Completed 7-day IV Unasyn  Agitation - on seroquel - PRN ativan - restraint  Bilateral carotid stenosis - Carotid vascular ultrasound showed severe right common carotid artery stenosis 80%, moderate to moderate stenosis on the left common carotid 50% - Appreciate vascular consult for outpatient follow up  - transition from aspirin fo Eliquis today, d/w neurology  Paroxysmal atrial fibrillation - Start Eliquis today  HTN - Holding home antihypertensives including amlodipine and lisinopril - start amlodipine  Increased creatinine - creat is still high but improved from 1.39 >1.32 - BUN is still high but improved from 25 > 24  - trend BUN and creat - renally dose medication - avoid nephrotoxins  Anemia (normocytic, normochromic) - Hb trends down from 10.7 > 10.4 - trend CBC - blood transfusion if Hb < 7  Leukocytosis - WBC trends up 11.5> 13 - one febrile episode; thick secretion - trend CBC - respiratory culture ordered - start abx again?  Bibasilar atelectasis - encourage deep breathing once off ventilators - incentive spirometry once off ventilators  Hypertriglyceridemia - Continue atorvastatin - Avoid Cleviprex & propofol   COPD  - history documented.  - No associated home meds listed.  - No evidence of acute exacerbation.  - PRN Duonebs; if needing frequently can add long-acting  BDs  Best practice (right click and "Reselect all SmartList Selections" daily)  Diet:  Tube Feed .  Pain/Anxiety/Delirium protocol (if indicated): yes VAP protocol (if indicated): yes DVT prophylaxis: Subcutaneous Heparin GI prophylaxis: PPI Glucose control:  SSI Yes Central venous access:  N/A Arterial line:  N/A Foley:  N/A Mobility: Out of bed to chair PT consulted: Yes Last date of multidisciplinary goals of care discussion [5/14: trach planned after plavix washout; d/w brother [name] at bedside with RN present  Code Status:  full code Disposition: ICU

## 2021-01-31 NOTE — Progress Notes (Signed)
eLink Physician-Brief Progress Note Patient Name: Adam Mcclain DOB: 1951-01-05 MRN: [id number]   Date of Service  01/31/2021  HPI/Events of Note  Patient with agitated delirium, trying to get out of bed despite Posey restraint.  eICU Interventions  Will try low dose Precedex targeting calmness without sedation (RAS 0 -1).        Thomasene Lot Kharter Sestak 01/31/2021, 8:00 PM

## 2021-01-31 NOTE — Progress Notes (Signed)
Physical Therapy Treatment Patient Details Name: Adam Mcclain MRN: [id number] DOB: 16-Oct-1950 Today's Date: 01/31/2021    History of Present Illness 70 y.o. male admitted on 01/19/21 for seizure.  Found to have several punctate acute right parietal cortical infarcts, etiology unclear (possibly a fib no on anticoagulant vs R CCA high grade stenosis).  He was intubated in the ED, extubated on 5/10, but had to be re-intubated due to vomiting and aspirating, extubated again on 5/12, but had to be re-intubated later that day.  Pt then self-extubated on 5/13 trach 5/16 PMH of stroke, PAD, HTN, nephrolithiasis, A-fib.    PT Comments    Pt restless upon PT and OT arrival to room with LEs scooted over to L side of bed with head/trunk towards R, pt stating "yes" he feels comfortable. Pt continues to present with L inattention, following commands somewhat better today but at times needs demonstrative cues and cues to terminate task asked of him (for example: "lift your leg" for sock donning, pt repeats task multiple times). Pt requiring mod +2 assist for bed mobility and transfer OOB to chair, copious secretions from trach site throughout. VSS on trach collar settings 5L/28% with good pleth. PT to continue to recommend CIR, will continue to follow acutely.    Follow Up Recommendations  CIR     Equipment Recommendations  Wheelchair (measurements PT);Wheelchair cushion (measurements PT);Hospital bed    Recommendations for Other Services       Precautions / Restrictions Precautions Precautions: Fall Precaution Comments: L attention, bilat mittens, trach, flexiseal, condom foley, posey waist restraint Required Braces or Orthoses: Other Brace (L prafo requested by PT 5/17) Restrictions Weight Bearing Restrictions: No    Mobility  Bed Mobility Overal bed mobility: Needs Assistance Bed Mobility: Rolling;Supine to Sit;Sit to Supine Rolling: +2 for physical assistance;Mod assist   Supine to sit: Mod  assist;+2 for physical assistance     General bed mobility comments: pt requires cues to move to the R side of bed. pt fixate on the exit on the left side of the bed with BIL LE. pt needs hand over hand facilitation. pt pushing into extension attempting to follow command to sit up.    Transfers Overall transfer level: Needs assistance Equipment used: 2 person hand held assist Transfers: Sit to/from Stand Sit to Stand: +2 physical assistance;Mod assist         General transfer comment: stand pivot to chair on L side, mod +2 for rise, steady, pivot to recliner, and slow controlled lower into recliner.  Ambulation/Gait             General Gait Details: 3 steps only   Stairs             Wheelchair Mobility    Modified Rankin (Stroke Patients Only) Modified Rankin (Stroke Patients Only) Pre-Morbid Rankin Score: No symptoms Modified Rankin: Moderately severe disability     Balance Overall balance assessment: Needs assistance Sitting-balance support: Bilateral upper extremity supported;Feet supported Sitting balance-Leahy Scale: Fair       Standing balance-Leahy Scale: Poor Standing balance comment: reliant on external support - able to pull trunk anteriorly with assist of UEs x5 throughout session                            Cognition Arousal/Alertness: Awake/alert Behavior During Therapy: Restless Overall Cognitive Status: No family/caregiver present to determine baseline cognitive functioning Area of Impairment: Following commands;Awareness  Current Attention Level: Sustained Memory: Decreased recall of precautions Following Commands: Follows one step commands with increased time;Follows one step commands inconsistently       General Comments: pt restless and responding to name call appropriately. pt does attempt to mouth words a few times during session. pt responding yes to all questions. Pt reaching for cortak  multiple times during session, requires cues to stop each time. pt rubbing head with R hand      Exercises      General Comments General comments (skin integrity, edema, etc.): trach collar 5L/28%, copious secretions from trach site during mobility      Pertinent Vitals/Pain Pain Assessment: Faces Faces Pain Scale: Hurts little more Pain Location: generalized, holding head Pain Descriptors / Indicators: Headache Pain Intervention(s): Limited activity within patient's tolerance;Monitored during session    Home Living                      Prior Function            PT Goals (current goals can now be found in the care plan section) Acute Rehab PT Goals Patient Stated Goal: to get up PT Goal Formulation: With patient Time For Goal Achievement: 02/10/21 Potential to Achieve Goals: Good Progress towards PT goals: Progressing toward goals    Frequency    Min 4X/week      PT Plan Current plan remains appropriate    Co-evaluation PT/OT/SLP Co-Evaluation/Treatment: Yes Reason for Co-Treatment: Complexity of the patient's impairments (multi-system involvement);Necessary to address cognition/behavior during functional activity;For patient/therapist safety;To address functional/ADL transfers PT goals addressed during session: Mobility/safety with mobility;Balance OT goals addressed during session: ADL's and self-care;Proper use of Adaptive equipment and DME;Strengthening/ROM      AM-PAC PT "6 Clicks" Mobility   Outcome Measure  Help needed turning from your back to your side while in a flat bed without using bedrails?: A Lot Help needed moving from lying on your back to sitting on the side of a flat bed without using bedrails?: A Lot Help needed moving to and from a bed to a chair (including a wheelchair)?: A Lot Help needed standing up from a chair using your arms (e.g., wheelchair or bedside chair)?: A Lot Help needed to walk in hospital room?: Total Help needed  climbing 3-5 steps with a railing? : Total 6 Click Score: 10    End of Session Equipment Utilized During Treatment: Oxygen Activity Tolerance: Patient tolerated treatment well Patient left: with call bell/phone within reach;in chair;with restraints reapplied;with nursing/sitter in room;Other (comment) (chair alarm not present, PT and OT pointed out posey belt donned, posey alarm belt present in room and could be placed. RN understands information, states it is fine to leave him restrained without chair alarm at this time as SLP is entering room) Nurse Communication: Mobility status PT Visit Diagnosis: Muscle weakness (generalized) (M62.81);Difficulty in walking, not elsewhere classified (R26.2);Other symptoms and signs involving the nervous system (R29.898);Hemiplegia and hemiparesis Hemiplegia - Right/Left: Left Hemiplegia - dominant/non-dominant: Non-dominant Hemiplegia - caused by: Cerebral infarction     Time: 1102-1129 PT Time Calculation (min) (ACUTE ONLY): 27 min  Charges:  $Therapeutic Activity: 8-22 mins                    Marye Round, PT DPT Acute Rehabilitation Services Pager [phone]  Office [phone]    Tyrone Apple E Christain [place] 01/31/2021, 2:31 PM

## 2021-01-31 NOTE — Progress Notes (Signed)
STROKE TEAM PROGRESS NOTE   SUBJECTIVE (INTERVAL HISTORY) RN and PT are at the bedside.  Pt now on trach collar, tolerating well. No acute event overnight. He still has intermittent agitation and on mittens. Discussed with Dr. 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 Will start eliquis and baby ASA.    OBJECTIVE Temp:  [98.4 F (36.9 C)-101.4 F (38.6 C)] 98.4 F (36.9 C) (05/18 0800) Pulse Rate:  [59-92] 86 (05/18 1131) Cardiac Rhythm: Normal sinus rhythm (05/18 0800) Resp:  [10-34] 28 (05/18 1131) BP: (96-180)/(45-127) 165/80 (05/18 1131) SpO2:  [92 %-100 %] 100 % (05/18 1131) FiO2 (%):  [28 %-30 %] 28 % (05/18 1131) Weight:  [60.7 kg] 60.7 kg (05/18 0500)  Recent Labs  Lab 01/30/21 1956 01/30/21 2354 01/31/21 0352 01/31/21 0818 01/31/21 1154  GLUCAP 111* 106* 113* 129* 118*   Recent Labs  Lab 01/25/21 0414 01/25/21 1621 01/27/21 5784 01/27/21 0617 01/28/21 0042 01/29/21 0116 01/30/21 0157 01/31/21 0026  NA 143   < > 141 143 141 142 141 139  K 3.1*   < > 4.8 3.9 3.3* 4.7 4.2 4.1  CL 114*  --  107  --  109 110 110 109  CO2 24  --  21*  --  25 23 26 24   GLUCOSE 126*  --  174*  --  200* 132* 146* 117*  BUN 31*  --  20  --  19 28* 26* 24*  CREATININE 1.19  --  1.23  --  1.16 1.29* 1.39* 1.32*  CALCIUM 8.2*  --  8.2*  --  7.9* 8.1* 7.9* 8.2*  MG 1.9  --   --   --  2.3  --   --   --   PHOS 2.3*  --   --   --   --   --   --   --    < > = values in this interval not displayed.   Recent Labs  Lab 01/25/21 0414 01/27/21 0607 01/29/21 0116  AST 56* 65* 38  ALT 54* 93* 66*  ALKPHOS 84 93 91  BILITOT 0.3 1.9* 0.8  PROT 5.4* 6.1* 5.5*  ALBUMIN 1.9* 2.2* 2.1*   Recent Labs  Lab 01/27/21 0607 01/27/21 0617 01/28/21 0042 01/29/21 0116 01/30/21 0157 01/31/21 0026  WBC 14.6*  --  9.2 10.5 11.5* 13.0*  HGB 12.9* 11.9* 11.2* 12.2* 10.7* 10.4*  HCT 38.9* 35.0* 33.1* 37.8* 32.5* 32.2*  MCV 96.3  --  95.1 97.9 98.2 97.9  PLT 300  --   248 239 235 255   No results for input(s): CKTOTAL, CKMB, CKMBINDEX, TROPONINI in the last 168 hours. No results for input(s): LABPROT, INR in the last 72 hours. No results for input(s): COLORURINE, LABSPEC, PHURINE, GLUCOSEU, HGBUR, BILIRUBINUR, KETONESUR, PROTEINUR, UROBILINOGEN, NITRITE, LEUKOCYTESUR in the last 72 hours.  Invalid input(s): APPERANCEUR     Component Value Date/Time   CHOL 118 01/23/2021 1215   TRIG 309 (H) 01/26/2021 0436   HDL 15 (L) 01/23/2021 1215   CHOLHDL 7.9 01/23/2021 1215   VLDL 60 (H) 01/23/2021 1215   LDLCALC 43 01/23/2021 1215   Lab Results  Component Value Date   HGBA1C 5.3 01/23/2021      Component Value Date/Time   LABOPIA NONE DETECTED 01/19/2021 1237   COCAINSCRNUR NONE DETECTED 01/19/2021 1237   LABBENZ POSITIVE (A) 01/19/2021 1237   AMPHETMU NONE DETECTED 01/19/2021 1237   THCU NONE DETECTED 01/19/2021 1237  LABBARB NONE DETECTED 01/19/2021 1237    No results for input(s): ETH in the last 168 hours.  I have personally reviewed the radiological images below and agree with the radiology interpretations.  CT ANGIO HEAD NECK W WO CM  Result Date: 01/29/2021 CLINICAL DATA:  Follow-up examination for stroke. EXAM: CT ANGIOGRAPHY HEAD AND NECK TECHNIQUE: Multidetector CT imaging of the head and neck was performed using the standard protocol during bolus administration of intravenous contrast. Multiplanar CT image reconstructions and MIPs were obtained to evaluate the vascular anatomy. Carotid stenosis measurements (when applicable) are obtained utilizing NASCET criteria, using the distal internal carotid diameter as the denominator. CONTRAST:  15mL OMNIPAQUE IOHEXOL 350 MG/ML SOLN COMPARISON:  Previous MRI from 01/22/2021. FINDINGS: CT HEAD FINDINGS Brain: Age-related cerebral atrophy with chronic small vessel ischemic disease. Chronic right parieto-occipital infarct again noted. Additional chronic left PCA distribution infarct noted as well.  Encephalomalacia at the anterior temporal pole on the right likely related to remote trauma. Recently identified small right parietal cortical infarcts not well visualized by CT. No intracranial hemorrhage or mass effect. No other new large vessel territory infarct. No mass effect or midline shift. No hydrocephalus or extra-axial fluid collection. Vascular: No hyperdense vessel. Calcified atherosclerosis at the skull base. Skull: Scalp soft tissues demonstrate no acute finding. Calvarium intact. Sinuses: Right frontoethmoidal and maxillary sinusitis. Nasogastric tube in place. Right greater than left bilateral mastoid effusions. Orbits: Globes and orbital soft tissues demonstrate no acute finding. Review of the MIP images confirms the above findings CTA NECK FINDINGS Aortic arch: Visualized aortic arch normal in caliber with normal 3 vessel morphology. Moderate atheromatous change about the arch itself. No significant stenosis about the innominate or left common carotid artery. The left subclavian artery occludes just distal to its origin. Distal reconstitution near the level of the left vertebral artery. Narrowed and attenuated flow seen distally within the left subclavian and axillary arteries (series 8, image 278). Additional 50% stenosis noted at the proximal right subclavian artery (series 8, image 312). Right subclavian artery irregular but otherwise patent. Right carotid system: Extensive atheromatous irregularity throughout the right CCA. Short-segment severe at least 80% stenosis noted involving the mid right CCA (series 8, image 262). Right common and internal carotid arteries irregular but otherwise patent to the skull base without stenosis, dissection or occlusion. Left carotid system: Diffuse atheromatous irregularity seen throughout the left CCA without high-grade stenosis. Concentric predominantly soft plaque about the left carotid bulb and proximal left ICA. Associated stenosis of up to approximately  50% by NASCET criteria. Left ICA patent distally without additional stenosis, dissection or occlusion. Note made of an additional approximate 50% stenosis at the proximal left external carotid artery Vertebral arteries: Both vertebral arteries appear to arise from the subclavian arteries, and are occluded at their origins. On the left, there is distal reconstitution at the proximal V2 segment (series 8, image 254). Markedly narrowed and irregular flow seen distally within the proximal and mid left V2 segment. The left vertebral artery is more normal in caliber and patent at the V3 segment. Left vertebral artery widely patent as it courses into the cranial vault. Flow within the left vertebral artery may be retrograde in nature given the proximal left subclavian occlusion. On the right, there is irregular distal reconstitution at the distal right V2 segment the a muscular branch (series 8, image 195). Right vertebral artery patent distally to the skull base. Skeleton: No visible acute osseous finding. No worrisome osseous lesions. 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 Patient is largely edentulous. Other neck: Endotracheal and enteric tubes in place. No other acute soft tissue abnormality within the neck. No mass or adenopathy. 5 mm left thyroid nodule noted, felt to be of doubtful significance given size and patient age, no follow-up imaging recommended (ref: J Am Coll Radiol. 2015 Feb;12(2): 143-50). Upper chest: Small layering bilateral pleural effusions partially visualized. Underlying emphysematous changes. Visualized upper chest demonstrates no other acute finding. Review of the MIP images confirms the above findings CTA HEAD FINDINGS Anterior circulation: Extensive atheromatous irregularity throughout the petrous, cavernous, and supraclinoid ICAs with associated moderate multifocal stenosis. Subtle 2-3 mm focal outpouching extending medially from the cavernous left ICA suspicious for a  small aneurysm (series 9, image 124). A1 segments patent bilaterally. Normal anterior communicating artery complex. Anterior cerebral arteries patent to their distal aspects without high-grade stenosis. Left M1 segment patent without high-grade stenosis. Short-segment moderate to severe right M1 stenosis (series 7, image 20). Negative MCA bifurcations. No proximal MCA branch occlusion. Distal MCA branches demonstrate diffuse atheromatous irregularity. Posterior circulation: Dominant left vertebral artery patent to the vertebrobasilar junction. Left PICA origin patent. Hypoplastic right vertebral artery largely terminates in PICA. Moderate proximal basilar artery stenosis (series 8, image 128). Basilar otherwise patent distally. Superior cerebellar arteries patent bilaterally. Both PCAs primarily supplied via the basilar. Right PCA well perfused to its distal aspect. Left PCA attenuated distally, in keeping with the chronic left PCA distribution infarct. Venous sinuses: Grossly patent allowing for timing the contrast bolus. Anatomic variants: Hypoplastic right vertebral artery terminates in PICA. Review of the MIP images confirms the above findings IMPRESSION: CT HEAD IMPRESSION: 1. Stable head CT. Recently identified possible small parietal infarcts not visible by CT. No other new acute intracranial abnormality. 2. Underlying atrophy with chronic ischemic changes as above. 3. Right-sided paranasal sinus disease. CTA HEAD AND NECK IMPRESSION: 1. 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 Left subclavian artery narrowed and attenuated but patent distally. 2. 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 Visualized flow within the left vertebral artery may be retrograde in nature given the proximal left subclavian artery occlusion. 3. Severe  80% right CCA stenosis. 4. 50% atheromatous stenosis at the left carotid bifurcation. 5. 50% proximal right subclavian artery stenosis. 6. Extensive atheromatous change throughout the carotid siphons with associated moderate multifocal stenosis. 7. Short-segment moderate to severe mid right M1 stenosis. 8. 2-3 mm focal outpouching extending medially from the cavernous left ICA, suspicious for a small aneurysm. 9. Small layering bilateral pleural effusions with underlying emphysema. Emphysema (ICD10-J43.9). Electronically Signed   By: Rise Mu M.D.   On: 01/29/2021 04:04   DG Chest 1 View  Result Date: 01/25/2021 CLINICAL DATA:  Intubation EXAM: CHEST  1 VIEW COMPARISON:  Portable exam 1553 hours compared to 01/23/2021 FINDINGS: Tip of endotracheal tube projects 2.9 cm above carina. Normal heart size, mediastinal contours, and pulmonary vascularity. Minimal atelectasis at lung bases. No definite infiltrate, pleural effusion or pneumothorax. IMPRESSION: Minimal bibasilar atelectasis. Electronically Signed   By: Ulyses Southward M.D.   On: 01/25/2021 17:12   DG Abd 1 View  Result Date: 01/25/2021 CLINICAL DATA:  Check gastric catheter placement EXAM: ABDOMEN - 1 VIEW COMPARISON:  None. FINDINGS: Gastric catheter is noted with the tip in the stomach. Proximal side port lies in the  distal esophagus however. This should be advanced several cm deeper into the stomach. IMPRESSION: Gastric catheter as described which should be advanced further into the stomach. Electronically Signed   By: Alcide Clever M.D.   On: 01/25/2021 18:50   CT Head Wo Contrast  Result Date: 01/19/2021 CLINICAL DATA:  Found down.  Possible seizure. EXAM: CT HEAD WITHOUT CONTRAST TECHNIQUE: Contiguous axial images were obtained from the base of the skull through the vertex without intravenous contrast. COMPARISON:  CT head December 30, 2020. FINDINGS: Brain: Similar encephalomalacia in the anterolateral right temporal lobe, and bilateral  occipital lobes. Similar small remote lacunar infarct versus dilated perivascular space in the left basal ganglia. No evidence of acute large vascular territory infarct. Moderate patchy white matter hypodensities, similar and likely related to chronic microvascular ischemic disease. No acute hemorrhage. Similar ex vacuo ventricular dilation without hydrocephalus. No visible extra-axial fluid collection. No mass lesion or abnormal mass effect. Vascular: No hyperdense vessel.  Calcific atherosclerosis. Skull: No acute fracture. Sinuses/Orbits: Sinuses are largely clear. No acute orbital abnormality. Other: No mastoid effusions. Debris in bilateral external auditory canals, presumably cerumen. Fluid layering in the pharynx, likely related to intubation. IMPRESSION: 1. No evidence of acute intracranial abnormality. 2. Similar encephalomalacia in the anterolateral right temporal lobe and bilateral occipital lobes. 3. Moderate chronic microvascular ischemic disease. Electronically Signed   By: Feliberto Harts MD   On: 01/19/2021 12:53   MR BRAIN WO CONTRAST  Result Date: 01/22/2021 CLINICAL DATA:  Seizure. EXAM: MRI HEAD WITHOUT CONTRAST TECHNIQUE: Multiplanar, multiecho pulse sequences of the brain and surrounding structures were obtained without intravenous contrast. COMPARISON:  Head CT 01/19/2021 and MRI 03/04/2019 FINDINGS: Brain: There is a moderate-sized chronic right parieto-occipital infarct with associated chronic blood products. Scattered subcentimeter foci of trace diffusion weighted signal hyperintensity in this region are largely felt to be secondary to susceptibility artifact from chronic blood products, however there may be a superimposed subcentimeter acute cortical infarct in the right parietal lobe along the anterosuperior aspect of the chronic encephalomalacia (series 3, image 36). No acute infarct is identified elsewhere. There is a large chronic left PCA infarct with chronic blood products.  There is also encephalomalacia anteriorly in the right temporal lobe which may reflect an old infarct or be secondary to remote trauma. Patchy T2 hyperintensities in the cerebral white matter bilaterally are nonspecific but compatible with moderate chronic small vessel ischemic disease. Chronic lacunar infarcts are noted in the deep white matter of the right cerebral hemisphere and in the left basal ganglia. There is mild to moderate cerebral atrophy. Scattered chronic microhemorrhages are noted in the cerebrum and cerebellum including in the deep gray nuclei and may reflect the sequelae of chronic hypertension. No mass, midline shift, or extra-axial fluid collection is evident. Vascular: Major intracranial vascular flow voids are preserved. Skull and upper cervical spine: Unremarkable bone marrow signal. Congenital C2-3 fusion. Sinuses/Orbits: Bilateral cataract extraction. Clear paranasal sinuses. Small to moderate bilateral mastoid effusions. Other: Fluid in the pharynx in the setting of intubation. IMPRESSION: 1. Possible subcentimeter acute right parietal cortical infarct along the margin of a chronic parieto-occipital infarct. 2. Large chronic left PCA infarct. 3. Encephalomalacia in the anterior right temporal lobe. 4. Moderate chronic small vessel ischemic disease with multiple chronic lacunar infarcts. Electronically Signed   By: Sebastian Ache M.D.   On: 01/22/2021 16:37   DG CHEST PORT 1 VIEW  Result Date: 01/29/2021 CLINICAL DATA:  Acute respiratory failure with hypercapnia. EXAM: PORTABLE CHEST 1 VIEW  COMPARISON:  Jan 27, 2021. FINDINGS: The heart size and mediastinal contours are within normal limits. Tracheostomy and feeding tubes are in good position. No pneumothorax is noted. Mild bibasilar subsegmental atelectasis is noted. Minimal right pleural effusion is noted. The visualized skeletal structures are unremarkable. IMPRESSION: Mild bibasilar subsegmental atelectasis. Minimal right pleural  effusion. Tracheostomy and feeding tubes in good position Electronically Signed   By: Lupita Raider M.D.   On: 01/29/2021 16:31   DG CHEST PORT 1 VIEW  Result Date: 01/27/2021 CLINICAL DATA:  Encounter for intubation of airway. EXAM: PORTABLE CHEST 1 VIEW COMPARISON:  01/25/2021 FINDINGS: ET tube tip is above the carina. There is a feeding tube with tip well below the GE junction in the projection of the right abdomen. Mild bibasilar opacities. No pleural effusions or interstitial edema. IMPRESSION: 1. ET tube tip above the carina. 2. Mild bibasilar opacities compatible with atelectasis versus pneumonia. Electronically Signed   By: Signa Kell M.D.   On: 01/27/2021 05:57   DG CHEST PORT 1 VIEW  Result Date: 01/23/2021 CLINICAL DATA:  Hypoxia EXAM: PORTABLE CHEST 1 VIEW COMPARISON:  Jan 21, 2021 FINDINGS: Endotracheal tube tip is at the carina directed toward the right main bronchus. Nasogastric tube tip and side port in stomach. No edema or airspace opacity. Apparent nipple shadow on the left, stable. Heart size and pulmonary vascularity are normal. No adenopathy. No bone lesions. IMPRESSION: Endotracheal tube tip at carina directed toward the right main bronchus. Advise withdrawing endotracheal tube approximately 3 cm. No edema or airspace opacity. These results will be called to the ordering clinician or representative by the Radiologist Assistant, and communication documented in the PACS or [employer]. Electronically Signed   By: Bretta Bang III M.D.   On: 01/23/2021 09:53   DG CHEST PORT 1 VIEW  Result Date: 01/21/2021 CLINICAL DATA:  Unresponsive, respirator dependent EXAM: PORTABLE CHEST 1 VIEW COMPARISON:  01/19/2021 chest radiograph. FINDINGS: Endotracheal tube tip is 3.0 cm above the carina. Enteric tube enters stomach with the tip not seen on this image. Stable cardiomediastinal silhouette with normal heart size. No pneumothorax. No pleural effusion. Mild medial bibasilar  atelectasis, new. No pulmonary edema. IMPRESSION: 1. Well-positioned endotracheal and enteric tubes. 2. New mild medial bibasilar atelectasis. Electronically Signed   By: Delbert Phenix M.D.   On: 01/21/2021 08:39   DG Chest Portable 1 View  Result Date: 01/19/2021 CLINICAL DATA:  Unresponsive. EXAM: PORTABLE CHEST 1 VIEW COMPARISON:  Chest x-ray dated December 30, 2020. FINDINGS: Endotracheal tube in place with the tip 4.7 cm above the carina. Enteric tube in the stomach. The heart size and mediastinal contours are within normal limits. Normal pulmonary vascularity. No focal consolidation, pleural effusion, or pneumothorax. No acute osseous abnormality. Prior cholecystectomy. IMPRESSION: 1. Appropriately positioned endotracheal and enteric tubes. 2. No active disease. Electronically Signed   By: Obie Dredge M.D.   On: 01/19/2021 12:30   DG Abd Portable 1V  Result Date: 01/20/2021 CLINICAL DATA:  Enteric tube placement. EXAM: PORTABLE ABDOMEN - 1 VIEW COMPARISON:  Chest x-ray 01/19/2021 FINDINGS: Evidence of patient's enteric tube in adequate position with tip just left of midline over the stomach in the upper abdomen. Surgical clips over the gallbladder fossa. Nonobstructive bowel gas pattern. Remainder the exam is unchanged. IMPRESSION: Enteric tube with tip just left of midline over the stomach in the upper abdomen. Electronically Signed   By: Elberta Fortis M.D.   On: 01/20/2021 10:49   EEG adult  Result Date: 01/19/2021 Charlsie Quest, MD     01/19/2021  1:30 PM Patient Name: LONZA SHIMABUKURO MRN: [id number] Epilepsy Attending: Charlsie Quest Referring Physician/Provider: Dr Caryl Pina Date: 01/19/2021 Duration: 27.02 mins Patient history: 70 year old male presenting with GTC seizures. EEG to evaluate for seizure Level of alertness:  lethargic AEDs during EEG study: LEV, Versed Technical aspects: This EEG study was done with scalp electrodes positioned according to the 10-20 International system of  electrode placement. Electrical activity was acquired at a sampling rate of  and reviewed with a high frequency filter of  and a low frequency filter of . EEG data were recorded continuously and digitally stored. Description: EEG showed continuous generalized polymorphic sharply contoured 3 to 6 Hz theta-delta slowing. There is an excessive amount of 15 to 18 Hz beta activity with irregular morphology distributed symmetrically and diffusely. Hyperventilation and photic stimulation were not performed.   ABNORMALITY - Continuous slow, generalized - Excessive beta, generalized IMPRESSION: This study is suggestive of moderate diffuse encephalopathy, nonspecific etiology. The excessive beta activity seen in the background is most likely due to the effect of benzodiazepine and is a benign EEG pattern. No seizures or epileptiform discharges were seen throughout the recording. Priyanka Annabelle Harman   Overnight EEG with video  Result Date: 01/21/2021 Charlsie Quest, MD     01/21/2021  9:34 AM Patient Name: ZIAH LEANDRO MRN: [id number] Epilepsy Attending: Charlsie Quest Referring Physician/Provider: Dr Caryl Pina Duration: 01/20/2021 0809 to 01/21/2021 0809  Patient history: 70 year old male presenting with GTC seizures. EEG to evaluate for seizure  Level of alertness:  comatose/sedated  AEDs during EEG study: LEV, Versed, propofol  Technical aspects: This EEG study was done with scalp electrodes positioned according to the 10-20 International system of electrode placement. Electrical activity was acquired at a sampling rate of  and reviewed with a high frequency filter of  and a low frequency filter of . EEG data were recorded continuously and digitally stored.  Description: EEG initially showed burst suppression pattern with bursts of low amplitude 15-18hz  beta activity admixed with 5-7hz  theta slowing lasting 1-2 seconds alternating with generalized suppression lasting 2-3 seconds.Gradually, as  propofol was weaned off around 1730 on 01/20/2021, eeg showed continuous generalized polymorphic sharply contoured 3 to 6 Hz theta-delta slowing at times with triphasic morphology. Hyperventilation and photic stimulation were not performed.    ABNORMALITY - Burst suppression, generalized - Continuous slow, generalized  IMPRESSION: This study was initially suggestive of profound diffuse encephalopathy likely due to sedation. As propofol was weaned, eeg was suggestive of moderate to severe diffuse encephalopathy, nonspecific etiology. No seizures or epileptiform discharges were seen throughout the recording.  Charlsie Quest   ECHOCARDIOGRAM COMPLETE  Result Date: 01/24/2021    ECHOCARDIOGRAM REPORT   Patient Name:   SAGAR TENGAN Date of Exam: 01/24/2021 Medical Rec #:  [id number]     Height: Accession #:    [id number]    Weight:       133.6 lb Date of Birth:  [birth year]     BSA:          1.543 m Patient Age:    69 years      BP:           101/69 mmHg Patient Gender: M             HR:           60 bpm. Exam Location:  Inpatient  Procedure: 2D Echo Indications:    stroke  History:        Patient has no prior history of Echocardiogram examinations.                 COPD and chronic kidney disease, Arrythmias:Atrial Fibrillation;                 Risk Factors:Hypertension.  Sonographer:    Delcie Roch Referring Phys: [id number] [facility]  Sonographer Comments: Echo performed with patient supine and on artificial respirator and suboptimal parasternal window. Image acquisition challenging due to uncooperative patient. IMPRESSIONS  1. Left ventricular ejection fraction, by estimation, is 60 to 65%. The left ventricle has normal function. The left ventricle has no regional wall motion abnormalities. Left ventricular diastolic parameters were normal.  2. Right ventricular systolic function is normal. The right ventricular size is normal.  3. The mitral valve is normal in structure. No evidence of mitral valve  regurgitation. No evidence of mitral stenosis.  4. The aortic valve has an indeterminant number of cusps. Aortic valve regurgitation is not visualized. No aortic stenosis is present.  5. The inferior vena cava is normal in size with greater than 50% respiratory variability, suggesting right atrial pressure of 3 mmHg. FINDINGS  Left Ventricle: Left ventricular ejection fraction, by estimation, is 60 to 65%. The left ventricle has normal function. The left ventricle has no regional wall motion abnormalities. The left ventricular internal cavity size was normal in size. There is  no left ventricular hypertrophy. Left ventricular diastolic parameters were normal. Right Ventricle: The right ventricular size is normal.Right ventricular systolic function is normal. Left Atrium: Left atrial size was normal in size. Right Atrium: Right atrial size was normal in size. Pericardium: There is no evidence of pericardial effusion. Mitral Valve: The mitral valve is normal in structure. No evidence of mitral valve regurgitation. No evidence of mitral valve stenosis. Tricuspid Valve: The tricuspid valve is normal in structure. Tricuspid valve regurgitation is trivial. No evidence of tricuspid stenosis. Aortic Valve: The aortic valve has an indeterminant number of cusps. Aortic valve regurgitation is not visualized. No aortic stenosis is present. Pulmonic Valve: The pulmonic valve was not well visualized. Pulmonic valve regurgitation is not visualized. No evidence of pulmonic stenosis. Aorta: The aortic root is normal in size and structure. Venous: The inferior vena cava is normal in size with greater than 50% respiratory variability, suggesting right atrial pressure of 3 mmHg. IAS/Shunts: No atrial level shunt detected by color flow Doppler.  LEFT VENTRICLE PLAX 2D LVIDd:         4.00 cm  Diastology LVIDs:         2.50 cm  LV e' medial:    7.40 cm/s LV PW:         0.90 cm  LV E/e' medial:  10.8 LV IVS:        0.80 cm  LV e' lateral:    7.94 cm/s LVOT diam:     1.80 cm  LV E/e' lateral: 10.0 LV SV:         61 LV SV Index:   39 LVOT Area:     2.54 cm  RIGHT VENTRICLE             IVC RV S prime:     16.00 cm/s  IVC diam: 2.00 cm TAPSE (M-mode): 2.5 cm LEFT ATRIUM             Index       RIGHT  ATRIUM           Index LA diam:        3.00 cm 1.94 cm/m  RA Area:     14.70 cm LA Vol (A2C):   44.3 ml 28.71 ml/m RA Volume:   37.40 ml  24.24 ml/m LA Vol (A4C):   42.4 ml 27.48 ml/m LA Biplane Vol: 44.0 ml 28.52 ml/m  AORTIC VALVE LVOT Vmax:   116.00 cm/s LVOT Vmean:  71.200 cm/s LVOT VTI:    0.238 m  AORTA Ao Root diam: 2.90 cm MITRAL VALVE MV Area (PHT): 3.37 cm    SHUNTS MV Decel Time: 225 msec    Systemic VTI:  0.24 m MV E velocity: 79.70 cm/s  Systemic Diam: 1.80 cm MV A velocity: 63.80 cm/s MV E/A ratio:  1.25 Olga Millers MD Electronically signed by Olga Millers MD Signature Date/Time: 01/24/2021/4:20:42 PM    Final    VAS US CAROTID  Result Date: 01/23/2021 Carotid Arterial Duplex Study Patient Name:  PHENG PROKOP  Date of Exam:   01/23/2021 Medical Rec #: [id number]      Accession #:    [id number] Date of Birth: [birth year]      Patient Gender: M Patient Age:   69Y Exam Location:  [facility] Procedure:      VAS US CAROTID Referring Phys: [id number] [facility] --------------------------------------------------------------------------------  Indications:       CVA. Risk Factors:      Hypertension, hyperlipidemia, current smoker, prior CVA,                    PAD. Other Factors:     Afib, CKD, COPD. Comparison Study:  No previous exams Performing Technologist: Ernestene Mention  Examination Guidelines: A complete evaluation includes B-mode imaging, spectral Doppler, color Doppler, and power Doppler as needed of all accessible portions of each vessel. Bilateral testing is considered an integral part of a complete examination. Limited examinations for reoccurring indications may be performed as noted.  Right Carotid Findings:  +----------+--------+--------+--------+------------------+------------------+           PSV cm/sEDV cm/sStenosisPlaque DescriptionComments           +----------+--------+--------+--------+------------------+------------------+ CCA Prox  58      20                                intimal thickening +----------+--------+--------+--------+------------------+------------------+ CCA Distal627     144     >50%    heterogenous      intimal thickening +----------+--------+--------+--------+------------------+------------------+ ICA Prox  77      16      1-39%   heterogenous      intimal thickening +----------+--------+--------+--------+------------------+------------------+ ICA Distal51      20                                                   +----------+--------+--------+--------+------------------+------------------+ ECA       318     0       >50%    heterogenous                         +----------+--------+--------+--------+------------------+------------------+ +----------+--------+-------+----------------+-------------------+           PSV cm/sEDV cmsDescribe        Arm  Pressure (mmHG) +----------+--------+-------+----------------+-------------------+ [id number]            Multiphasic, WNL                    +----------+--------+-------+----------------+-------------------+ +---------+--------+--------+-----------------+ VertebralPSV cm/sEDV cm/sSeverely dampened +---------+--------+--------+-----------------+  Left Carotid Findings: +----------+-------+--------+--------+-----------------------+-----------------+           PSV    EDV cm/sStenosisPlaque Description     Comments                    cm/s                                                            +----------+-------+--------+--------+-----------------------+-----------------+ CCA Prox  97     25                                     intimal                                                                    thickening        +----------+-------+--------+--------+-----------------------+-----------------+ CCA Distal98     24              heterogenous           intimal                                                                   thickening        +----------+-------+--------+--------+-----------------------+-----------------+ ICA Prox  269    69      60-79%  heterogenous and                                                          calcific                                 +----------+-------+--------+--------+-----------------------+-----------------+ ICA Distal125    27                                                       +----------+-------+--------+--------+-----------------------+-----------------+ ECA       564    39      >50%    heterogenous                             +----------+-------+--------+--------+-----------------------+-----------------+ +----------+-------+--------+-------------------------------+------------------+  PSV    EDV cm/sDescribe                       Arm Pressure                 cm/s                                          (mmHG)             +----------+-------+--------+-------------------------------+------------------+ Subclavian71     13      Low resistive, post stenotic                                               flow                                              +----------+-------+--------+-------------------------------+------------------+ +---------+--------+--+--------+--+---------+ VertebralPSV cm/s42EDV cm/s13Antegrade +---------+--------+--+--------+--+---------+   Summary: Right Carotid: Velocities in the right ICA are consistent with a 1-39% stenosis.                Hemodynamically significant plaque 80-99% visualized in the CCA.                The ECA appears >50% stenosed. Left Carotid: Velocities in the left ICA are consistent with a 60-79% stenosis.                Non-hemodynamically significant plaque <50% noted in the CCA. The               ECA appears >50% stenosed. Vertebrals:  Left vertebral artery demonstrates antegrade flow. Right vertebral              artery shows severely dampened waveforms. Subclavians: Normal flow hemodynamics were seen in the right subclavian artery.              Left subclavian artery shows low resistive, post stenotic              waveforms. *See table(s) above for measurements and observations.  Vascular consult recommended. Electronically signed by Gretta Began MD on 01/23/2021 at 9:43:00 PM.    Final    VAS [place] TRANSCRANIAL DOPPLER  Result Date: 01/25/2021  Transcranial Doppler Patient Name:  CHILTON SALLADE  Date of Exam:   01/25/2021 Medical Rec #: [id number]      Accession #:    [id number] Date of Birth: Jan 27, 1951      Patient Gender: M Patient Age:   69Y Exam Location:  [facility] Procedure:      VAS [place] TRANSCRANIAL DOPPLER Referring Phys: [id number] [facility] --------------------------------------------------------------------------------  Indications: Stroke. Comparison Study: no prior Performing Technologist: Blanch Media RVS  Examination Guidelines: A complete evaluation includes B-mode imaging, spectral Doppler, color Doppler, and power Doppler as needed of all accessible portions of each vessel. Bilateral testing is considered an integral part of a complete examination. Limited examinations for reoccurring indications may be performed as noted.  +----------+-------------+----------+-----------+-------+ RIGHT TCD Right VM (cm)Depth (cm)PulsatilityComment +----------+-------------+----------+-----------+-------+ MCA           58.00  1.10            +----------+-------------+----------+-----------+-------+ ACA          -32.00                 0.88            +----------+-------------+----------+-----------+-------+ Term ICA      43.00                 1.01             +----------+-------------+----------+-----------+-------+ PCA           35.00                 0.60            +----------+-------------+----------+-----------+-------+ Opthalmic     11.00                 2.08            +----------+-------------+----------+-----------+-------+ ICA siphon    46.00                 1.27            +----------+-------------+----------+-----------+-------+ Vertebral    -28.00                 0.99            +----------+-------------+----------+-----------+-------+  +----------+------------+----------+-----------+------------------+ LEFT TCD  Left VM (cm)Depth (cm)Pulsatility     Comment       +----------+------------+----------+-----------+------------------+ MCA          20.00                 0.79                       +----------+------------+----------+-----------+------------------+ ACA          -23.00                0.61                       +----------+------------+----------+-----------+------------------+ Term ICA                                   unable to insonate +----------+------------+----------+-----------+------------------+ PCA                                        unable to insonate +----------+------------+----------+-----------+------------------+ Opthalmic    17.00                 1.17                       +----------+------------+----------+-----------+------------------+ ICA siphon   53.00                 1.18                       +----------+------------+----------+-----------+------------------+ Vertebral    -24.00                1.18                       +----------+------------+----------+-----------+------------------+  +------------+-------+-------+             VM cm/sComment +------------+-------+-------+ Dist Basilar-20.00         +------------+-------+-------+  Summary:  Poor left temporal window limits evaluation of anetrior circulation on left otherwise normal mean flow  velocities in majority of identified vessels of anterior and posterior cerebral circulation. *See table(s) above for TCD measurements and observations.  Diagnosing physician: Delia Heady MD Electronically signed by Delia Heady MD on 01/25/2021 at 11:58:40 AM.    Final    US Abdomen Limited RUQ (LIVER/GB)  Result Date: 01/27/2021 CLINICAL DATA:  Hyperbilirubinemia. EXAM: ULTRASOUND ABDOMEN LIMITED RIGHT UPPER QUADRANT COMPARISON:  None. FINDINGS: Gallbladder: Gallbladder is surgically absent. Common bile duct: Diameter: 6.7 millimeter Liver: The liver parenchyma is normal in echogenicity. Numerous hepatic cysts are identified. In the LEFT hepatic lobe, cyst is 3.0 x 3.0 x 4.0 centimeters. A second cyst is 2 x 2 x 2 centimeters. In the RIGHT hepatic lobe, cyst is 4 x 3 x 3 centimeters. No solid or suspicious liver masses. Portal vein is patent on color Doppler imaging with normal direction of blood flow towards the liver. Other: None. IMPRESSION: 1. Multiple hepatic cysts. 2. Cholecystectomy. 3. No biliary duct dilatation identified. Electronically Signed   By: Norva Pavlov M.D.   On: 01/27/2021 13:45     PHYSICAL EXAM  Temp:  [98.4 F (36.9 C)-101.4 F (38.6 C)] 98.4 F (36.9 C) (05/18 0800) Pulse Rate:  [59-92] 86 (05/18 1131) Resp:  [10-34] 28 (05/18 1131) BP: (96-180)/(45-127) 165/80 (05/18 1131) SpO2:  [92 %-100 %] 100 % (05/18 1131) FiO2 (%):  [28 %-30 %] 28 % (05/18 1131) Weight:  [60.7 kg] 60.7 kg (05/18 0500)  General - Well nourished, well developed, status post trach.  Ophthalmologic - fundi not visualized due to noncooperation.  Cardiovascular - Regular rate and rhythm.  Neuro - s/p trach off precedex, on trach collar, eyes widely open, awake alert, following simple commands both midline and peripheral. Eyes in mid position, tracking bilaterally, but not blinking to visual threat on the left, inconsistently blinking to visual threat on the right, PERRL. Facial symmetrical.  Tongue protrusion midline. BUE on mittens, 4/5 on there right and 3+/5 on the left, BLEs 4/5 proximal and 3/5 distally. No babinski. Sensation subjectively symmetrical, coordination grossly intact bilaterally and gait not tested.   ASSESSMENT/PLAN Adam Mcclain a 70 year old malewith a PMHx ofCVA, HTN, atrialfibrillation on aspirin and Plavix, COPD, and CKDpresenting with GTC seizures found to have a possible subcentimeter acute right parietal cortical infarct.   Stroke - several punctate acute right parietal cortical infarct, etiology uncertain, AF not AC vs. Right CCA and M1 high grade stenosis  CT:  No evidence of acute intracranial abnormality. Old left PCA infarct.  MRI  Possible subcentimeter acute right parietal cortical infarcts along the margin of a chronic parieto-occipital infarct. Large chronic left PCA infarct and multiple chronic lacunar infarcts.   TCD unremarkable  Carotid Doppler:  right CCA 80-99%.  Left ICA 60 to 79% stenosis.  Right ABI severely dampened waveforms.  Left subclavian artery low resistant, post stenotic waveforms  CTA head and neck - severe right CCA 80% stenosis. 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 50% right subclavian A stenosis. B/l ICA siphon athero with moderate stenosis. Right M1 short segment severe stenosis.   2D Echo EF 60 to 65%  VTE prophylaxis - heparin 5000 units ppx   No antithrombotics PTA, now on eliquis and ASA 81. Continue on discharge.  Therapy recommendations: CIR  Disposition:  Pending  A fib not on AC  A fib history not on anticoagulant but on DAPT prior to admission.   Sinus rhythm currently  Now on eliquis   Seizure  No clare history of seizures.  cEEG with Triphasics 2-3 Hz on ictal-interictal spectrum but no seizures. Also notable for moderate to severe diffuse encephalopathy.    cEEG was disontinued after LTM for more than 48 hours with no seizures.  Continue Keppra 1000mg  BID IV->PO via cortrak  Carotid stenosis  history of right CEA 2017  Carotid Doppler:  right CCA 80-99%.  Left ICA 60 to 79% stenosis.  Right ABI severely dampened waveforms.  Left subclavian artery low resistant, post stenotic waveforms  CTA head and neck severe right CCA 80% stenosis. Left ICA 50% stenosis with calcified and soft plaques   [facility]VS Dr. Chestine Sporelark on board - no acute intervention at this time, will follow up in clinic  Continue ASA 81 on discharge.   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   50% right subclavian A stenosis.   Dr. Chestine Sporelark [facility] on board - no acute intervention at this time, will follow up in clinic  Continue ASA 81 on discharge.  Hypertension  Home meds:  Norvasc 5, Lasix 20mg , Zestril 20mg    off Levophed  BP stable but on the low end  Avoid low BP  Long-term BP goal 130-150 given severe carotid stenosis  Acute hypoxic resp failure  Aspiration PNA  Management per CCM, appreciated  Self -extubated 5/13 -> reintubated  +Moraxella catarrhalis on resp culture.   CCM planning IV Unasyn x 7 days.   Still has copious thick secretions  S/p trach 5/16, on trach collar now  Hyperlipidemia  Home meds:  Lipitor 40mg    LDL 43, goal < 70  LFT elevated, AST/ALT 56/54-> 65/93->38/66  Lipitor resumed  Continue statin at discharge  Dysphagia  NPO  Speech on board  On TF @55cc /hr  Diabetes type II Controlled  HgbA1c 5.3, goal < 7.0  CBGs  SSI  Close outpatient PCP follow-up  Other Stroke Risk Factors  Advanced Age >/= 5965   History of stroke Large chronic left PCA infarct and multiple chronic lacunar infarcts.  Other Active Problems  Hypokalemia, K3.1->3.6->3.9->3.3->4.7->4.2  Leukocytosis WBC  7.9->14.6->9.2->10.5->11.5->13.0  AKI Cre 1.23->1.16->1.29->1.39->1.32  Hospital day # 12  Neurology will follow peripherally. Please feel free to call with any questions. Pt [facility] stroke [facility] weeks after discharge.   Marvel PlanJindong Tashiya Souders, MD PhD Stroke Neurology 01/31/2021 12:00 PM    To contact Stroke Continuity provider, please refer to [email or web address]. After hours, contact General Neurology

## 2021-01-31 NOTE — Progress Notes (Signed)
Patient continuously trying to get out of bed. No continuous sedation. I gave PRN ativan twice with no result. I called [facility] and requested a posey belt order at this time.

## 2021-01-31 NOTE — Progress Notes (Signed)
  Progress Note    01/31/2021 9:17 AM * No surgery found *  Subjective:  No new neurological events overnight. With trach on ventilator    Vitals:   01/31/21 0759 01/31/21 0800  BP: (!) 159/103 (!) 180/83  Pulse: 82 84  Resp: (!) 34 (!) 26  Temp:  98.4 F (36.9 C)  SpO2: 98% 95%   Physical Exam: Remains in ICU with trach on ventilator Able to follow and responds to simple commands Moving all extremities without deficits  CBC    Component Value Date/Time   WBC 13.0 (H) 01/31/2021 0026   RBC 3.29 (L) 01/31/2021 0026   HGB 10.4 (L) 01/31/2021 0026   HCT 32.2 (L) 01/31/2021 0026   PLT 255 01/31/2021 0026   MCV 97.9 01/31/2021 0026   MCH 31.6 01/31/2021 0026   MCHC 32.3 01/31/2021 0026   RDW 13.5 01/31/2021 0026   LYMPHSABS 0.9 01/21/2021 0549   MONOABS 0.7 01/21/2021 0549   EOSABS 0.0 01/21/2021 0549   BASOSABS 0.0 01/21/2021 0549    BMET    Component Value Date/Time   NA 139 01/31/2021 0026   K 4.1 01/31/2021 0026   CL 109 01/31/2021 0026   CO2 24 01/31/2021 0026   GLUCOSE 117 (H) 01/31/2021 0026   BUN 24 (H) 01/31/2021 0026   CREATININE 1.32 (H) 01/31/2021 0026   CALCIUM 8.2 (L) 01/31/2021 0026   GFRNONAA 58 (L) 01/31/2021 0026    INR No results found for: INR   Intake/Output Summary (Last 24 hours) at 01/31/2021 5852 Last data filed at 01/31/2021 0800 Gross per 24 hour  Intake 1599.5 ml  Output 2275 ml  Net -675.5 ml     Assessment/Plan:  70 y.o. male with acute right parietal cortical infarcts and found to have 80% right common carotid stenosis as well as left subclavian occlusion. The etiology for common carotid lesions isnot clearly the source as he has multiple other underlying risk factors for his stroke including hx of A. fib not on anticoagulation as well as significant intracranial disease. Eliquis restarted today, continue Lipitor. At this time will again defer to neurology for their input on whether or not they feel that his common carotid  artery lesion is etiology. Otherwise will continue to follow as patient improves clinically before proceeding with any intervention  DVT prophylaxis:  Sq heparin  Graceann Congress, PA-C [facility] [phone] 01/31/2021 9:17 AM

## 2021-01-31 NOTE — Progress Notes (Signed)
[facility]  Subjective  -weaning on ventilator status post trach.   Objective (!) 180/83 84 98.4 F (36.9 C) (Axillary) (!) 26 95%  Intake/Output Summary (Last 24 hours) at 01/31/2021 0925 Last data filed at 01/31/2021 0800 Gross per 24 hour  Intake 1599.5 ml  Output 2275 ml  Net -675.5 ml    Remains in ICU with trach in place Appears to be grossly moving all extremities  Laboratory Lab Results: Recent Labs    01/30/21 0157 01/31/21 0026  WBC 11.5* 13.0*  HGB 10.7* 10.4*  HCT 32.5* 32.2*  PLT 235 255   BMET Recent Labs    01/30/21 0157 01/31/21 0026  NA 141 139  K 4.2 4.1  CL 110 109  CO2 26 24  GLUCOSE 146* 117*  BUN 26* 24*  CREATININE 1.39* 1.32*  CALCIUM 7.9* 8.2*    COAG No results found for: INR, PROTIME No results found for: PTT  Assessment/Planning:  70 year old male admitted with questionable generalized seizure and then found to have acute right parietal cortical infarcts.  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  I have reviewed the CTA and on the side of his stroke he has no significant carotid bifurcation disease in the ICA and agree he has about an 80% mid common carotid stenosis.     I discussed with Dr. Roda Shutters this morning with neurology.  Turns out the patient has had a right carotid endarterectomy in 2017 by Dr. Imogene Burn.  This was documented in another medical record chart that had not been merged.  Certainly raises the question of a clamp injury here proximal to the patch.  We both agreed that with his other medical issues at this time would not plan any surgical intervention here in the hospital with outpatient follow-up pending his recovery and  functional status.  He will be started on anticoagulation at some point for his underlying A. fib and has other risk factors including his A. fib not on anticoagulation as well as intracranial disease for his recent right brain event.  Cephus Shelling 01/31/2021 9:25 AM --

## 2021-01-31 NOTE — Evaluation (Signed)
Passy-Muir Speaking Valve - Evaluation Patient Details  Name: Adam Mcclain MRN: [id number] Date of Birth: 11-23-1950  Today's Date: 01/31/2021 Time: 1129-1149 SLP Time Calculation (min) (ACUTE ONLY): 20 min  Past Medical History:  Past Medical History:  Diagnosis Date  . A-fib (HCC)   . GERD (gastroesophageal reflux disease)   . HTN (hypertension)   . Nephrolithiasis   . PAD (peripheral artery disease) (HCC)   . Stroke [facility])    Past Surgical History:  Past Surgical History:  Procedure Laterality Date  . AORTOGRAM    . CATARACT EXTRACTION, BILATERAL    . CHOLECYSTECTOMY     HPI:  70 y.o. male admitted on 01/19/21 for seizure. Found to have several punctate acute right parietal cortical infarcts.  He was intubated in the ED, extubated on 5/10, but had to be re-intubated due to vomiting and aspirating, extubated again on 5/12, but had to be re-intubated later that day.  Pt then self-extubated on 5/13 trach 5/16 PMH of stroke, PAD, HTN, nephrolithiasis, A-fib.   Assessment / Plan / Recommendation Clinical Impression  Pt seen for PMV during initial trach collar trial with copious secretions. His volitional cough is strong and more effective after valve placement. PMV donned for increments of 2-3 minutes with increased respiratory rate and indications of air trapping. Difficulty managing secretions and vocal quality wet, hoarse with decreased intensity. SpO2 92%, RR 29-46. Recommend PMV with ST only and full supervision. SLP Visit Diagnosis: Aphonia (R49.1)    SLP Assessment  Patient needs continued Speech Lanaguage Pathology Services    Follow Up Recommendations  Skilled Nursing facility    Frequency and Duration min 2x/week  2 weeks    PMSV Trial PMSV was placed for: 2-3 min intervals Able to redirect subglottic air through upper airway: Yes Able to Attain Phonation: Yes Voice Quality: Wet;Low vocal intensity;Hoarse Able to Expectorate Secretions: Yes Level of Secretion  Expectoration with PMSV: Tracheal Breath Support for Phonation: Mildly decreased Intelligibility: Intelligibility reduced Word: 25-49% accurate Phrase: 25-49% accurate Sentence: 0-24% accurate Respirations During Trial:  (29-46) SpO2 During Trial: 93 % Behavior: Alert   Tracheostomy Tube       Vent Dependency  FiO2 (%): 28 %    Cuff Deflation Trial  GO Tolerated Cuff Deflation: Yes Length of Time for Cuff Deflation Trial:  (20 min) Behavior: Alert;Restless;Cooperative        Adam Mcclain 01/31/2021, 4:18 PM

## 2021-02-01 LAB — GLUCOSE, CAPILLARY
Glucose-Capillary: 154 mg/dL — ABNORMAL HIGH (ref 70–99)
Glucose-Capillary: 103 mg/dL — ABNORMAL HIGH (ref 70–99)
Glucose-Capillary: 118 mg/dL — ABNORMAL HIGH (ref 70–99)
Glucose-Capillary: 138 mg/dL — ABNORMAL HIGH (ref 70–99)
Glucose-Capillary: 97 mg/dL (ref 70–99)

## 2021-02-01 LAB — METHYLMALONIC ACID, SERUM: Methylmalonic Acid, Quantitative: 1697 nmol/L — ABNORMAL HIGH (ref 0–378)

## 2021-02-01 MED ORDER — CLONAZEPAM 0.5 MG PO TABS: 0.5000 mg | ORAL_TABLET | Freq: Every day | ORAL | Status: DC

## 2021-02-01 MED ORDER — QUETIAPINE FUMARATE 100 MG PO TABS
100.0000 mg | ORAL_TABLET | Freq: Two times a day (BID) | ORAL | Status: DC
  Filled 2021-02-01: qty 1

## 2021-02-01 MED ORDER — CLONAZEPAM 0.5 MG PO TABS
0.5000 mg | ORAL_TABLET | Freq: Every day | ORAL | Status: DC
  Administered 2021-02-02: 0.5 mg
  Filled 2021-02-01: qty 1

## 2021-02-01 MED ORDER — CLONAZEPAM 0.5 MG PO TABS
0.5000 mg | ORAL_TABLET | Freq: Two times a day (BID) | ORAL | Status: DC | PRN
  Administered 2021-02-01: 0.5 mg
  Filled 2021-02-01: qty 1

## 2021-02-01 MED ORDER — QUETIAPINE FUMARATE 100 MG PO TABS
100.0000 mg | ORAL_TABLET | Freq: Two times a day (BID) | ORAL | Status: DC
  Administered 2021-02-01 – 2021-02-02 (×3): 100 mg
  Filled 2021-02-01 (×2): qty 1

## 2021-02-01 MED ORDER — CLONAZEPAM 0.5 MG PO TABS: 0.5000 mg | ORAL_TABLET | Freq: Two times a day (BID) | ORAL | Status: DC | PRN

## 2021-02-01 NOTE — Progress Notes (Signed)
Physical Therapy Treatment Patient Details Name: Adam Mcclain MRN: [id number] DOB: [birth year] Today's Date: 02/01/2021    History of Present Illness 70 y.o. male admitted on 01/19/21 for seizure.  Found to have several punctate acute right parietal cortical infarcts, etiology unclear (possibly a fib no on anticoagulant vs R CCA high grade stenosis).  He was intubated in the ED, extubated on 5/10, but had to be re-intubated due to vomiting and aspirating, extubated again on 5/12, but had to be re-intubated later that day.  Pt then self-extubated on 5/13 trach 5/16 PMH of stroke, PAD, HTN, nephrolithiasis, A-fib.    PT Comments    The pt was seen for continued progression of OOB mobility this session. He presents today with significant restlessness in bed, indicating that he would like to get OOB to the recliner. The pt remains restless and impulsive, but was redirectable today with verbal and intermittent tactile cues. He was able to complete bed mobility with minA for assist, and +2 for safety and line management. He continues to require modA to power up from sitting, and to manage stability with initial stepping to recliner. The pt was able to reposition himself when cued, and indicate needs such as a pillow under his arm. The pt will continue to benefit from skilled PT to progress standing stability as well as ambulation distance.     Follow Up Recommendations  CIR     Equipment Recommendations  Wheelchair (measurements PT);Wheelchair cushion (measurements PT);Hospital bed    Recommendations for Other Services       Precautions / Restrictions Precautions Precautions: Fall Precaution Comments: L attention, bilat mittens, trach, condom foley, posey waist restraint Other Brace: L PRAFO Restrictions Weight Bearing Restrictions: No    Mobility  Bed Mobility Overal bed mobility: Needs Assistance Bed Mobility: Rolling;Supine to Sit Rolling: Min assist;+2 for safety/equipment   Supine  to sit: Min assist;+2 for safety/equipment     General bed mobility comments: pt requires cues to slow movement, but was restless and able to initiate roll to either side of bed without assist. minA to safely manage lines, minA to elevate trunk from HOB, +2 for safety and line management    Transfers Overall transfer level: Needs assistance Equipment used: 2 person hand held assist Transfers: Sit to/from [employer] Sit to Stand: +2 physical assistance;Mod assist Stand pivot transfers: Mod assist;+2 physical assistance       General transfer comment: +2 stand-pivot to recliner on L, pt taking small pivoting steps to recliner with HHA.  Ambulation/Gait             General Gait Details: 3 lateral steps to recliner only      Modified Rankin (Stroke Patients Only) Modified Rankin (Stroke Patients Only) Pre-Morbid Rankin Score: No symptoms Modified Rankin: Moderately severe disability     Balance Overall balance assessment: Needs assistance Sitting-balance support: Bilateral upper extremity supported;Feet supported Sitting balance-Leahy Scale: Fair Sitting balance - Comments: pt leaning forward due to anticipation for chair, modA to maintain static sitting due to forward lean Postural control: Left lateral lean Standing balance support: Bilateral upper extremity supported Standing balance-Leahy Scale: Poor Standing balance comment: reliant on external support                            Cognition Arousal/Alertness: Awake/alert Behavior During Therapy: Restless;Impulsive Overall Cognitive Status: No family/caregiver present to determine baseline cognitive functioning Area of Impairment: Following commands;Awareness  Current Attention Level: Sustained Memory: Decreased recall of precautions Following Commands: Follows one step commands with increased time;Follows one step commands inconsistently       General  Comments: pt restless, responding to name as well as simple cues such as "wait" despite making it clear he wants to move. Pt indicating he would like cortak removed removed multiple times through session, but remined redirectable.      Exercises      General Comments General comments (skin integrity, edema, etc.): trach collar 5L/28%, copious secretions from trach site during mobility      Pertinent Vitals/Pain Pain Assessment: Faces Faces Pain Scale: Hurts a little bit Pain Location: general discomfort, restlessness Pain Descriptors / Indicators: Restless Pain Intervention(s): Limited activity within patient's tolerance;Monitored during session;Repositioned           PT Goals (current goals can now be found in the care plan section) Acute Rehab PT Goals Patient Stated Goal: to get up PT Goal Formulation: With patient Time For Goal Achievement: 02/10/21 Potential to Achieve Goals: Good Progress towards PT goals: Progressing toward goals    Frequency    Min 4X/week      PT Plan Current plan remains appropriate       AM-PAC PT "6 Clicks" Mobility   Outcome Measure  Help needed turning from your back to your side while in a flat bed without using bedrails?: A Lot Help needed moving from lying on your back to sitting on the side of a flat bed without using bedrails?: A Lot Help needed moving to and from a bed to a chair (including a wheelchair)?: A Lot Help needed standing up from a chair using your arms (e.g., wheelchair or bedside chair)?: A Lot Help needed to walk in hospital room?: Total Help needed climbing 3-5 steps with a railing? : Total 6 Click Score: 10    End of Session Equipment Utilized During Treatment: Oxygen Activity Tolerance: Patient tolerated treatment well Patient left: in chair;with call bell/phone within reach;with chair alarm set;with family/visitor present;with restraints reapplied Nurse Communication: Mobility status PT Visit Diagnosis:  Muscle weakness (generalized) (M62.81);Difficulty in walking, not elsewhere classified (R26.2);Other symptoms and signs involving the nervous system (R29.898);Hemiplegia and hemiparesis Hemiplegia - Right/Left: Left Hemiplegia - dominant/non-dominant: Non-dominant Hemiplegia - caused by: Cerebral infarction     Time: 7628-3151 PT Time Calculation (min) (ACUTE ONLY): 29 min  Charges:  $Therapeutic Activity: 23-37 mins                     Rolm Baptise, PT, DPT   Acute Rehabilitation Department Pager #: [phone]   Gaetana Michaelis 02/01/2021, 2:15 PM

## 2021-02-01 NOTE — Evaluation (Signed)
Speech Language Pathology Evaluation Patient Details Name: Adam Mcclain MRN: [id number] DOB: March 25, 1951 Today's Date: 02/01/2021 Time: 2542-7062 SLP Time Calculation (min) (ACUTE ONLY): 16 min  Problem List:  Patient Active Problem List   Diagnosis Date Noted  . Protein-calorie malnutrition, severe 01/26/2021  . Cerebral thrombosis with cerebral infarction 01/24/2021  . Pressure injury of skin 01/23/2021  . Seizure (HCC) 01/19/2021   Past Medical History:  Past Medical History:  Diagnosis Date  . A-fib (HCC)   . GERD (gastroesophageal reflux disease)   . HTN (hypertension)   . Nephrolithiasis   . PAD (peripheral artery disease) (HCC)   . Stroke [facility])    Past Surgical History:  Past Surgical History:  Procedure Laterality Date  . AORTOGRAM    . CATARACT EXTRACTION, BILATERAL    . CHOLECYSTECTOMY     HPI:  70 y.o. male admitted on 01/19/21 for seizure. Found to have several punctate acute right parietal cortical infarcts.  He was intubated in the ED, extubated on 5/10, but had to be re-intubated due to vomiting and aspirating, extubated again on 5/12, but had to be re-intubated later that day.  Pt then self-extubated on 5/13 trach 5/16 PMH of stroke, PAD, HTN, nephrolithiasis, A-fib.   Assessment / Plan / Recommendation Clinical Impression  Pt evaluated with intermittent donning of PMV. Speech is significantly dysarthric with facial weakness, missing dentition. Cognitive areas of weakness include awareness, sustained attention, memory, problem solving and orientation to situation and place. Continued ST recommended to address areas facilitating independence.    SLP Assessment  SLP Recommendation/Assessment: Patient needs continued Speech Lanaguage Pathology Services SLP Visit Diagnosis: Cognitive communication deficit (R41.841);Dysarthria and anarthria (R47.1)    Follow Up Recommendations  Inpatient Rehab;Skilled Nursing facility    Frequency and Duration min 2x/week  2  weeks      SLP Evaluation Cognition  Overall Cognitive Status: Impaired/Different from baseline Arousal/Alertness: Awake/alert Orientation Level: Oriented to person;Disoriented to place;Disoriented to situation Attention: Sustained Sustained Attention: Impaired Sustained Attention Impairment: Verbal basic Memory: Impaired Memory Impairment: Decreased recall of new information Awareness: Impaired Awareness Impairment: Intellectual impairment;Emergent impairment;Anticipatory impairment Problem Solving: Impaired Problem Solving Impairment: Verbal basic Behaviors: Restless;Impulsive Safety/Judgment: Impaired       Comprehension  Auditory Comprehension Overall Auditory Comprehension: Appears within functional limits for tasks assessed Visual Recognition/Discrimination Discrimination: Not tested Reading Comprehension Reading Status: Not tested    Expression Expression Primary Mode of Expression: Verbal Verbal Expression Overall Verbal Expression: Appears within functional limits for tasks assessed Pragmatics: Impairment Impairments: Abnormal affect;Eye contact Written Expression Dominant Hand: Right Written Expression: Not tested   Oral / Motor  Oral Motor/Sensory Function Overall Oral Motor/Sensory Function: Moderate impairment Facial ROM: Reduced left;Suspected CN VII (facial) dysfunction Facial Symmetry: Abnormal symmetry left;Suspected CN VII (facial) dysfunction Motor Speech Overall Motor Speech: Impaired Respiration: Impaired Level of Impairment: Sentence Phonation: Wet;Low vocal intensity Resonance: Within functional limits Articulation: Impaired Level of Impairment: Word Intelligibility: Intelligibility reduced Word: 25-49% accurate Phrase: 0-24% accurate Motor Planning: Witnin functional limits   GO                    Roque Cash, Shellia Cleverly M.Ed Sports administrator Pager [phone] Office [phone]   02/01/2021, 4:51  PM

## 2021-02-01 NOTE — Progress Notes (Signed)
  Progress Note    02/01/2021 7:29 AM * No surgery found *  Subjective:  No neuro events overnight   Vitals:   02/01/21 0500 02/01/21 0600  BP: (!) 91/51 118/63  Pulse: (!) 59 72  Resp: (!) 27 (!) 29  Temp:    SpO2: 98% 98%   Physical Exam Lungs:  Mechanical ventilation Extremities:  Grossly moving all extremities Neurologic: Following commands; awake and alert  CBC    Component Value Date/Time   WBC 13.0 (H) 01/31/2021 0026   RBC 3.29 (L) 01/31/2021 0026   HGB 10.4 (L) 01/31/2021 0026   HCT 32.2 (L) 01/31/2021 0026   PLT 255 01/31/2021 0026   MCV 97.9 01/31/2021 0026   MCH 31.6 01/31/2021 0026   MCHC 32.3 01/31/2021 0026   RDW 13.5 01/31/2021 0026   LYMPHSABS 0.9 01/21/2021 0549   MONOABS 0.7 01/21/2021 0549   EOSABS 0.0 01/21/2021 0549   BASOSABS 0.0 01/21/2021 0549    BMET    Component Value Date/Time   NA 139 01/31/2021 0026   K 4.1 01/31/2021 0026   CL 109 01/31/2021 0026   CO2 24 01/31/2021 0026   GLUCOSE 117 (H) 01/31/2021 0026   BUN 24 (H) 01/31/2021 0026   CREATININE 1.32 (H) 01/31/2021 0026   CALCIUM 8.2 (L) 01/31/2021 0026   GFRNONAA 58 (L) 01/31/2021 0026    INR No results found for: INR   Intake/Output Summary (Last 24 hours) at 02/01/2021 0729 Last data filed at 02/01/2021 0604 Gross per 24 hour  Intake 1400.24 ml  Output 3525 ml  Net -2124.76 ml     Assessment/Plan:  70 y.o. male with R CVA  No neurological events overnight Plans noted to wean to trach collar today Plan is for patient to follow up as an outpatient after recovery from current hospitalization to discuss management of R common carotid lesion   Emilie Rutter, PA-C [facility] [phone] 02/01/2021 7:29 AM

## 2021-02-01 NOTE — Progress Notes (Signed)
  Speech Language Pathology Treatment: Dysphagia;Passy Muir Speaking valve  Patient Details Name: Adam Mcclain MRN: [id number] DOB: Aug 04, 1951 Today's Date: 02/01/2021 Time: 7893-8101 SLP Time Calculation (min) (ACUTE ONLY): 16 min  Assessment / Plan / Recommendation Clinical Impression  Pt has a significant amount of secretions prior to and following cuff deflation. His cough is strong to propel secretions via trach and oral cavity and therapist manipulated valve placement in attempts to move secretions through upper pharynx versus trach. Approximately 8-10 minutes elapsed before coughing ceased and able to keep valve in place. HR and SpO2 normal range and RR 27-33. Evident fatigue and work of breathing noted. Speech was significantly dysarthric and vocal quality wet reducing intelligible utterances. SLP use of valve only at this time.  Trial of ice chips given and he declined applesauce. Manipulation was mild and swallow onset delayed. Was not agreeable to more than several chips. SLP will continue po's. As his respiratory status and secretion management improve his ability suspect his desire and ability to improve as well.    HPI HPI: 70 y.o. male admitted on 01/19/21 for seizure. Found to have several punctate acute right parietal cortical infarcts.  He was intubated in the ED, extubated on 5/10, but had to be re-intubated due to vomiting and aspirating, extubated again on 5/12, but had to be re-intubated later that day.  Pt then self-extubated on 5/13 trach 5/16 PMH of stroke, PAD, HTN, nephrolithiasis, A-fib.      SLP Plan  Continue with current plan of care       Recommendations  Diet recommendations: NPO Medication Administration: Via alternative means      Patient may use Passy-Muir Speech Valve: with SLP only PMSV Supervision: Full         Oral Care Recommendations: Oral care BID Follow up Recommendations: Inpatient Rehab;Skilled Nursing facility SLP Visit Diagnosis: Aphonia  (R49.1);Dysphagia, unspecified (R13.10) Plan: Continue with current plan of care       GO                [name] 02/01/2021, 4:21 PM  Breck Coons Lonell Face.Ed Nurse, children's [phone] Office [phone]

## 2021-02-01 NOTE — Plan of Care (Signed)
  Problem: Safety: Goal: Non-violent Restraint(s) Outcome: Progressing   Problem: Clinical Measurements: Goal: Respiratory complications will improve Outcome: Progressing   Problem: Activity: Goal: Risk for activity intolerance will decrease Outcome: Progressing   Problem: Nutrition: Goal: Adequate nutrition will be maintained Outcome: Progressing

## 2021-02-01 NOTE — Progress Notes (Addendum)
eLink Physician-Brief Progress Note Patient Name: Adam Mcclain DOB: September 13, 1951 MRN: [id number]   Date of Service  02/01/2021  HPI/Events of Note  Patient now resting peacefully but MAP is 62 mmHg on Precedex gtt at 0.6 mcg.  eICU Interventions  Precedex reduced to a fixed, non-titratable dose of 0.2 mcg, targeting a MAP > 65 mmHg. If MAP goal not achieved, will consider starting peripheral Phenylephrine gtt.        Thomasene Lot Herve Haug 02/01/2021, 4:38 AM

## 2021-02-01 NOTE — TOC Initial Note (Signed)
Transition of Care [facility]) - Initial/Assessment Note    Patient Details  Name: Adam Mcclain MRN: [id number] Date of Birth: 11-24-1950  [facility]) CM/SW Contact:    Adam Mac, RN Phone Number: 02/01/2021, 4:26 PM  Clinical Narrative:  70 y.o. male admitted on 01/19/21 for seizure.  Found to have several punctate acute right parietal cortical infarcts, etiology unclear (possibly a fib no on anticoagulant vs R CCA high grade stenosis).  He was intubated in the ED, extubated on 5/10, but had to be re-intubated due to vomiting and aspirating, extubated again on 5/12, but had to be re-intubated later that day.  Pt then self-extubated on 5/13 trach 5/16.  Prior to admission, patient independent and living at home alone.  Patient status post new tracheostomy with increased secretions.  MD requesting LTAC consult.  Spoke with patient's brother, Adam Mcclain, regarding LTAC choice: Brother prefers [facility] for [facility], if possible.  Referral to Adam Mcclain, admissions liaison for [facility].  Patient is eligible for long-term acute care services, per admissions liaison; will provide updates for timing of bed availability when known.                 Expected Discharge Plan: Long Term Acute Care (LTAC) Barriers to Discharge: Continued Medical Work up   Patient Goals and CMS Choice     Choice offered to / list presented to : Sibling  Expected Discharge Plan and Services Expected Discharge Plan: Long Term Acute Care (LTAC)   Discharge Planning Services: CM Consult Post Acute Care Choice: Long Term Acute Care (LTAC) Living arrangements for the past 2 months: Apartment                                      Prior Living Arrangements/Services Living arrangements for the past 2 months: Apartment Lives with:: Self Patient language and need for interpreter reviewed:: Yes        Need for Family Participation in Patient Care: Yes (Comment) Care giver  support system in place?: No (comment)   Criminal Activity/Legal Involvement Pertinent to Current Situation/Hospitalization: No - Comment as needed            Emotional Assessment Appearance:: Appears stated age Attitude/Demeanor/Rapport: Unable to Assess Affect (typically observed): Unable to Assess Orientation: : Oriented to Self      Admission diagnosis:  Seizure [facility]) [R56.9] Patient Active Problem List   Diagnosis Date Noted  . Protein-calorie malnutrition, severe 01/26/2021  . Cerebral thrombosis with cerebral infarction 01/24/2021  . Pressure injury of skin 01/23/2021  . Seizure (HCC) 01/19/2021   PCP:  Adam Mcclain No Pharmacy:   [facility] [place], [place] - [address] [place] [address] Phone: [phone] Fax: [phone]     Social Determinants of Health (SDOH) Interventions    Readmission Risk Interventions No flowsheet data found.  Adam Baton, RN, BSN  Trauma/Neuro ICU Case Manager [phone]

## 2021-02-01 NOTE — Progress Notes (Signed)
eLink Physician-Brief Progress Note Patient Name: Adam Mcclain DOB: 10-03-1950 MRN: [id number]   Date of Service  02/01/2021  HPI/Events of Note  Patient needs wrist restraints order renewed for his safety.  eICU Interventions  Restraints order renewed.        Thomasene Lot Naphtali Riede 02/01/2021, 6:51 AM

## 2021-02-01 NOTE — Progress Notes (Addendum)
NAME:  Adam Mcclain, MRN:  [id number], DOB:  August 30, 1951, LOS: 13 ADMISSION DATE:  01/19/2021, CONSULTATION DATE: 5/6 REFERRING MD: Dr. Rubin Payor, CHIEF COMPLAINT: Seizure  History of Present Illness:  70 year old male with past medical history as below, which is significant for CVA, HTN, afibrillation on aspirin and Plavix, COPD, and CKD.  In the midday hours of 5/6 he had accompanied by family member to a doctor's appointment.  He was waiting in the car when a bystander walked by and noticed he had a gaze deviation and was shaking.  EMS was called.  Upon EMS arrival the patient was unresponsive and was administered 10 mg of Versed.  He was bagged in route to the emergency department where he was promptly intubated on presentation.  The ED physician did note some rigidity in the upper extremities concerning for seizure activity.  CT of the head was nonacute.  The patient was evaluated by neurology who provided a loading dose of Keppra and started EEG monitoring.  PCCM was asked to admit the patient due to the requirement of mechanical ventilation.  Significant Hospital Events: Including procedures, antibiotic start and stop dates in addition to other pertinent events   . 5/6 admit to [facility] for seizures on the ventilator. . 5/7 EEG shows no seizure activity . 5/8 propofol was turned off, patient is spiked fever but white count trended down . 5/9 EEG was negative for seizures, MRI was done showed very small right parietal stroke . 5/10 trial of extubation, patient is struggled had to be reintubated . 5/11 MRI brain showed subcentimeter new stroke . 5/12 tolerated pressure support, extubated and had to be reintubated because of respiratory distress . 5/13 patient was a scheduled for tracheostomy but he self extubated early this morning at shift change . 5/14 reintubated overnight for uncontrolled WOB and inability to manage secretions. Plavix stopped in prep for trach. . Became bradycardic while  on Precedex, dose was decreased and he was started on Versed . 5/16 tracheostomy was uneventful  Interim History / Subjective:  The patient got agitated. He was given precedex and his MAP dropped to 62 mm Hg. Precedex is reduced and MAP returned to normal.  The patient wants to get up from the bed.  Objective   Blood pressure 135/77, pulse 67, temperature 98.6 F (37 C), temperature source Axillary, resp. rate (!) 21, height 5\' 2"  (1.575 m), weight 61 kg, SpO2 98 %.    FiO2 (%):  [28 %] 28 %   Intake/Output Summary (Last 24 hours) at 02/01/2021 1040 Last data filed at 02/01/2021 0604 Gross per 24 hour  Intake 1235.24 ml  Output 3525 ml  Net -2289.76 ml   Filed Weights   01/30/21 0436 01/31/21 0500 02/01/21 0500  Weight: 60.7 kg 60.7 kg 61 kg    Examination: General: critically il appearing man lying in bed, comfortable with tracheostomy on room air and NG HENT: [place]/AT, eyes anicteric, moist mucous membranes Lungs: inspiratory rhonchi, thick secretions from ETT Cardiovascular: S1S2, RRR Abdomen: soft, NT Extremities: no clubbing, no cyanosis, and no edema Neuro: RASS +1, GCS 14T Skin: no rashes, warm & dry  Labs/imaging that I have personally reviewed  (right click and "Reselect all SmartList Selections" daily)  Respiratory culture on 5/18- pending BUN 24 (5/18) Creatinine 1.32 (5/18) WBC trends up from 11.5> 13 (5/18) Hgb trends down from 10.7> 10.4 (5/18)  Resolved Hospital Problem list   AKI on CKD stage III A Hypokalemia/hypophosphatemia- resolved  Assessment &  Plan:  New 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  - Completed 7-day IV Unasyn   Agitation - on seroquel - avoid precedex due to its hypotension as the side effect - encourage day time activity to reduce agitation   Bilateral carotid stenosis - Carotid vascular ultrasound showed severe right common carotid artery stenosis 80%, moderate to moderate stenosis on the left common carotid 50% - Appreciate vascular consult for outpatient follow up  - on secondary prevention of stroke  Paroxysmal atrial fibrillation - on Eliquis  HTN - start amlodipine - Holding home antihypertensives including hydrochlorothiazide and lisinopril  Increased creatinine - creat is still high but improved from 1.39 >1.32 (5/18) - BUN is still high but improved from 25 > 24 (5/18) - trend BUN and creat - renally dose medication - avoid nephrotoxins  Anemia (normocytic, normochromic) - Hb trends down from 10.7 > 10.4 - trend CBC - blood transfusion if Hb < 7  Leukocytosis - WBC trends up 11.5> 13 - one febrile episode; thick secretion - trend CBC - respiratory culture ordered  Bibasilar atelectasis - encourage deep breathing once off ventilators - incentive spirometry once off ventilators  Hypertriglyceridemia - Continue atorvastatin - Avoid Cleviprex & propofol   COPD  - history documented.  - No associated home meds listed.  - No evidence of acute exacerbation.  - PRN Duonebs; if needing frequently can add long-acting BDs  Best practice (right click and "Reselect all SmartList Selections" daily)  Diet:  Tube Feed .  Pain/Anxiety/Delirium protocol (if indicated): yes VAP protocol (if indicated): yes DVT prophylaxis: Subcutaneous Heparin GI prophylaxis: PPI Glucose control:  SSI Yes Central venous access:  N/A Arterial line:  N/A Foley:  N/A Mobility: Out of  bed to chair PT consulted: Yes Last date of multidisciplinary goals of care discussion [5/14: trach planned after plavix washout; d/w brother 03-07-1993 at bedside with RN present  Code Status:  full code Disposition: ICU

## 2021-02-02 ENCOUNTER — Inpatient Hospital Stay
Admit: 2021-02-02 | Discharge: 2021-03-01 | Disposition: A | Discharge status: Skilled Nursing Facility | Payer: Medicare Other | Attending: Internal Medicine | Admitting: Internal Medicine

## 2021-02-02 DIAGNOSIS — J189 Pneumonia, unspecified organism: Secondary | ICD-10-CM

## 2021-02-02 DIAGNOSIS — I480 Paroxysmal atrial fibrillation: Secondary | ICD-10-CM | POA: Diagnosis present

## 2021-02-02 DIAGNOSIS — Z4659 Encounter for fitting and adjustment of other gastrointestinal appliance and device: Secondary | ICD-10-CM

## 2021-02-02 DIAGNOSIS — Z930 Tracheostomy status: Secondary | ICD-10-CM

## 2021-02-02 DIAGNOSIS — J9621 Acute and chronic respiratory failure with hypoxia: Secondary | ICD-10-CM | POA: Diagnosis present

## 2021-02-02 DIAGNOSIS — K5900 Constipation, unspecified: Secondary | ICD-10-CM

## 2021-02-02 DIAGNOSIS — I639 Cerebral infarction, unspecified: Secondary | ICD-10-CM | POA: Diagnosis present

## 2021-02-02 DIAGNOSIS — G40909 Epilepsy, unspecified, not intractable, without status epilepticus: Secondary | ICD-10-CM

## 2021-02-02 DIAGNOSIS — Z9049 Acquired absence of other specified parts of digestive tract: Secondary | ICD-10-CM | POA: Diagnosis not present

## 2021-02-02 DIAGNOSIS — D729 Disorder of white blood cells, unspecified: Secondary | ICD-10-CM

## 2021-02-02 DIAGNOSIS — J208 Acute bronchitis due to other specified organisms: Secondary | ICD-10-CM | POA: Diagnosis not present

## 2021-02-02 DIAGNOSIS — J40 Bronchitis, not specified as acute or chronic: Secondary | ICD-10-CM | POA: Diagnosis not present

## 2021-02-02 DIAGNOSIS — N281 Cyst of kidney, acquired: Secondary | ICD-10-CM | POA: Diagnosis not present

## 2021-02-02 DIAGNOSIS — R531 Weakness: Secondary | ICD-10-CM | POA: Diagnosis present

## 2021-02-02 DIAGNOSIS — J9601 Acute respiratory failure with hypoxia: Secondary | ICD-10-CM | POA: Diagnosis present

## 2021-02-02 DIAGNOSIS — J9610 Chronic respiratory failure, unspecified whether with hypoxia or hypercapnia: Secondary | ICD-10-CM | POA: Diagnosis not present

## 2021-02-02 DIAGNOSIS — M6281 Muscle weakness (generalized): Secondary | ICD-10-CM | POA: Diagnosis not present

## 2021-02-02 DIAGNOSIS — I739 Peripheral vascular disease, unspecified: Secondary | ICD-10-CM | POA: Diagnosis not present

## 2021-02-02 DIAGNOSIS — R569 Unspecified convulsions: Secondary | ICD-10-CM | POA: Diagnosis not present

## 2021-02-02 DIAGNOSIS — I6523 Occlusion and stenosis of bilateral carotid arteries: Secondary | ICD-10-CM | POA: Diagnosis present

## 2021-02-02 DIAGNOSIS — Z6823 Body mass index (BMI) 23.0-23.9, adult: Secondary | ICD-10-CM | POA: Diagnosis not present

## 2021-02-02 DIAGNOSIS — D72829 Elevated white blood cell count, unspecified: Secondary | ICD-10-CM | POA: Diagnosis not present

## 2021-02-02 DIAGNOSIS — Z931 Gastrostomy status: Secondary | ICD-10-CM | POA: Diagnosis not present

## 2021-02-02 DIAGNOSIS — Z4682 Encounter for fitting and adjustment of non-vascular catheter: Secondary | ICD-10-CM | POA: Diagnosis not present

## 2021-02-02 DIAGNOSIS — N189 Chronic kidney disease, unspecified: Secondary | ICD-10-CM | POA: Diagnosis not present

## 2021-02-02 DIAGNOSIS — B965 Pseudomonas (aeruginosa) (mallei) (pseudomallei) as the cause of diseases classified elsewhere: Secondary | ICD-10-CM | POA: Diagnosis not present

## 2021-02-02 DIAGNOSIS — Z66 Do not resuscitate: Secondary | ICD-10-CM | POA: Diagnosis present

## 2021-02-02 DIAGNOSIS — E46 Unspecified protein-calorie malnutrition: Secondary | ICD-10-CM | POA: Diagnosis not present

## 2021-02-02 DIAGNOSIS — J9600 Acute respiratory failure, unspecified whether with hypoxia or hypercapnia: Secondary | ICD-10-CM | POA: Diagnosis not present

## 2021-02-02 DIAGNOSIS — K7689 Other specified diseases of liver: Secondary | ICD-10-CM | POA: Diagnosis not present

## 2021-02-02 DIAGNOSIS — E781 Pure hyperglyceridemia: Secondary | ICD-10-CM | POA: Diagnosis present

## 2021-02-02 DIAGNOSIS — J449 Chronic obstructive pulmonary disease, unspecified: Secondary | ICD-10-CM | POA: Diagnosis present

## 2021-02-02 DIAGNOSIS — D72819 Decreased white blood cell count, unspecified: Secondary | ICD-10-CM | POA: Diagnosis not present

## 2021-02-02 DIAGNOSIS — Z8673 Personal history of transient ischemic attack (TIA), and cerebral infarction without residual deficits: Secondary | ICD-10-CM | POA: Diagnosis not present

## 2021-02-02 DIAGNOSIS — R1310 Dysphagia, unspecified: Secondary | ICD-10-CM | POA: Diagnosis not present

## 2021-02-02 DIAGNOSIS — F32A Depression, unspecified: Secondary | ICD-10-CM | POA: Diagnosis not present

## 2021-02-02 DIAGNOSIS — R1312 Dysphagia, oropharyngeal phase: Secondary | ICD-10-CM | POA: Diagnosis not present

## 2021-02-02 DIAGNOSIS — N179 Acute kidney failure, unspecified: Secondary | ICD-10-CM | POA: Diagnosis not present

## 2021-02-02 DIAGNOSIS — K3189 Other diseases of stomach and duodenum: Secondary | ICD-10-CM | POA: Diagnosis not present

## 2021-02-02 DIAGNOSIS — I10 Essential (primary) hypertension: Secondary | ICD-10-CM | POA: Diagnosis not present

## 2021-02-02 DIAGNOSIS — Z430 Encounter for attention to tracheostomy: Secondary | ICD-10-CM | POA: Diagnosis not present

## 2021-02-02 DIAGNOSIS — I129 Hypertensive chronic kidney disease with stage 1 through stage 4 chronic kidney disease, or unspecified chronic kidney disease: Secondary | ICD-10-CM | POA: Diagnosis not present

## 2021-02-02 DIAGNOSIS — F418 Other specified anxiety disorders: Secondary | ICD-10-CM | POA: Diagnosis not present

## 2021-02-02 DIAGNOSIS — R0602 Shortness of breath: Secondary | ICD-10-CM | POA: Diagnosis not present

## 2021-02-02 DIAGNOSIS — F419 Anxiety disorder, unspecified: Secondary | ICD-10-CM | POA: Diagnosis not present

## 2021-02-02 DIAGNOSIS — F17200 Nicotine dependence, unspecified, uncomplicated: Secondary | ICD-10-CM | POA: Diagnosis present

## 2021-02-02 HISTORY — DX: Paroxysmal atrial fibrillation: I48.0

## 2021-02-02 HISTORY — DX: Acute and chronic respiratory failure with hypoxia: J96.21

## 2021-02-02 HISTORY — DX: Cerebral infarction, unspecified: I63.9

## 2021-02-02 HISTORY — DX: Epilepsy, unspecified, not intractable, without status epilepticus: G40.909

## 2021-02-02 HISTORY — DX: Tracheostomy status: Z93.0

## 2021-02-02 LAB — COMPREHENSIVE METABOLIC PANEL
ALT: 81 U/L — ABNORMAL HIGH (ref 0–44)
AST: 48 U/L — ABNORMAL HIGH (ref 15–41)
Albumin: 2.4 g/dL — ABNORMAL LOW (ref 3.5–5.0)
Alkaline Phosphatase: 87 U/L (ref 38–126)
Anion gap: 7 (ref 5–15)
BUN: 27 mg/dL — ABNORMAL HIGH (ref 8–23)
CO2: 24 mmol/L (ref 22–32)
Calcium: 8.4 mg/dL — ABNORMAL LOW (ref 8.9–10.3)
Chloride: 106 mmol/L (ref 98–111)
Creatinine, Ser: 1.19 mg/dL (ref 0.61–1.24)
GFR, Estimated: >60 mL/min (ref >60)
Glucose, Bld: 131 mg/dL — ABNORMAL HIGH (ref 70–99)
Potassium: 4.3 mmol/L (ref 3.5–5.1)
Sodium: 137 mmol/L (ref 135–145)
Total Bilirubin: 0.4 mg/dL (ref 0.3–1.2)
Total Protein: 6.3 g/dL — ABNORMAL LOW (ref 6.5–8.1)

## 2021-02-02 LAB — CBC WITH DIFFERENTIAL/PLATELET
Abs Immature Granulocytes: 0.09 10*3/uL — ABNORMAL HIGH (ref 0.00–0.07)
Basophils Absolute: 0 10*3/uL (ref 0.0–0.1)
Basophils Relative: 0 %
Eosinophils Absolute: 0.2 10*3/uL (ref 0.0–0.5)
Eosinophils Relative: 2 %
HCT: 35 % — ABNORMAL LOW (ref 39.0–52.0)
Hemoglobin: 11.5 g/dL — ABNORMAL LOW (ref 13.0–17.0)
Immature Granulocytes: 1 %
Lymphocytes Relative: 7 %
Lymphs Abs: 0.8 10*3/uL (ref 0.7–4.0)
MCH: 31.8 pg (ref 26.0–34.0)
MCHC: 32.9 g/dL (ref 30.0–36.0)
MCV: 96.7 fL (ref 80.0–100.0)
Monocytes Absolute: 0.7 10*3/uL (ref 0.1–1.0)
Monocytes Relative: 6 %
Neutro Abs: 10.7 10*3/uL — ABNORMAL HIGH (ref 1.7–7.7)
Neutrophils Relative %: 84 %
Platelets: 315 10*3/uL (ref 150–400)
RBC: 3.62 MIL/uL — ABNORMAL LOW (ref 4.22–5.81)
RDW: 12.9 % (ref 11.5–15.5)
WBC: 12.6 10*3/uL — ABNORMAL HIGH (ref 4.0–10.5)
nRBC: 0 % (ref 0.0–0.2)

## 2021-02-02 LAB — GLUCOSE, CAPILLARY
Glucose-Capillary: 112 mg/dL — ABNORMAL HIGH (ref 70–99)
Glucose-Capillary: 114 mg/dL — ABNORMAL HIGH (ref 70–99)
Glucose-Capillary: 133 mg/dL — ABNORMAL HIGH (ref 70–99)
Glucose-Capillary: 131 mg/dL — ABNORMAL HIGH (ref 70–99)

## 2021-02-02 LAB — MAGNESIUM: Magnesium: 2.2 mg/dL (ref 1.7–2.4)

## 2021-02-02 LAB — PHOSPHORUS: Phosphorus: 3.1 mg/dL (ref 2.5–4.6)

## 2021-02-02 MED ORDER — OSMOLITE 1.5 CAL PO LIQD: 1000.0000 mL | ORAL | 0 refills | Status: DC

## 2021-02-02 MED ORDER — IPRATROPIUM-ALBUTEROL 0.5-2.5 (3) MG/3ML IN SOLN: 3.0000 mL | Freq: Four times a day (QID) | RESPIRATORY_TRACT | 0 refills | Status: DC | PRN

## 2021-02-02 MED ORDER — CYANOCOBALAMIN 1000 MCG PO TABS: 1000.0000 ug | ORAL_TABLET | Freq: Every day | ORAL | 0 refills | Status: DC

## 2021-02-02 MED ORDER — APIXABAN 5 MG PO TABS: 5.0000 mg | ORAL_TABLET | Freq: Two times a day (BID) | ORAL | 0 refills | Status: AC

## 2021-02-02 MED ORDER — AMLODIPINE BESYLATE 10 MG PO TABS: 10.0000 mg | ORAL_TABLET | Freq: Every day | ORAL | 1 refills | Status: AC

## 2021-02-02 MED ORDER — ATORVASTATIN CALCIUM 40 MG PO TABS: 40.0000 mg | ORAL_TABLET | Freq: Every day | ORAL | 0 refills | Status: AC

## 2021-02-02 MED ORDER — LEVETIRACETAM 100 MG/ML PO SOLN: 1000.0000 mg | Freq: Two times a day (BID) | ORAL | 12 refills | Status: AC

## 2021-02-02 MED ORDER — ACETAMINOPHEN 325 MG PO TABS: 650.0000 mg | ORAL_TABLET | Freq: Four times a day (QID) | ORAL | 0 refills | Status: DC | PRN

## 2021-02-02 MED ORDER — FOLIC ACID 1 MG PO TABS: 1.0000 mg | ORAL_TABLET | Freq: Every day | ORAL | 0 refills | Status: AC

## 2021-02-02 MED ORDER — CLONAZEPAM 0.5 MG PO TABS: 0.5000 mg | ORAL_TABLET | Freq: Every day | ORAL | 0 refills | Status: DC

## 2021-02-02 MED ORDER — THIAMINE HCL 100 MG/ML IJ SOLN: 100.0000 mg | Freq: Every day | INTRAMUSCULAR | 0 refills | Status: DC

## 2021-02-02 MED ORDER — PROSOURCE TF PO LIQD: 45.0000 mL | Freq: Two times a day (BID) | ORAL | 0 refills | Status: DC

## 2021-02-02 MED ORDER — ADULT MULTIVITAMIN W/MINERALS CH: 1.0000 | ORAL_TABLET | Freq: Every day | ORAL | 0 refills | Status: AC

## 2021-02-02 MED ORDER — ASPIRIN 81 MG PO CHEW: 81.0000 mg | CHEWABLE_TABLET | Freq: Every day | ORAL | 0 refills | Status: DC

## 2021-02-02 MED ORDER — PANTOPRAZOLE SODIUM 40 MG PO PACK: 40.0000 mg | PACK | Freq: Every day | ORAL | 1 refills | Status: DC

## 2021-02-02 MED ORDER — QUETIAPINE FUMARATE 100 MG PO TABS: 100.0000 mg | ORAL_TABLET | Freq: Two times a day (BID) | ORAL | 0 refills | Status: DC

## 2021-02-02 NOTE — Consult Note (Incomplete)
Physical Medicine and Rehabilitation Consult Reason for Consult: Seizure Referring Physician: Dr.Chand   HPI: Adam Mcclain is a 70 y.o. right-handed male with history of hypertension, CVA, atrial fibrillation maintained on aspirin and Plavix, COPD/tobacco use, CKD stage III Per chart review patient lives alone independent prior to admission.  He does have family in the area.  Presented 01/19/2021 after witnessed seizure.  EMS was called patient continued to be unresponsive when they arrived.  The patient was administered 10 mg of Versed.  He was being bagged on arrival to the ED and emergently intubated.  Cranial CT scan showed no evidence of acute intracranial abnormality.  Admission chemistries unremarkable except potassium 2.7 CO2 19 glucose 138 BUN 7 creatinine 1.42, alcohol negative, and urine drug screen positive benzos, lactic acid 4.7, CK2 90.  MRI follow-up possible subcentimeter acute right parietal cortical infarct along the margin of chronic parietal occipital infarct.  Large chronic left PCA infarction.  Encephalomalacia in the anterior right temporal lobe.  EEG suggestive of moderate diffuse encephalopathy.  No seizure.  Patient had been loaded with Keppra.  Echocardiogram ejection fraction of 60 to 65% no wall motion abnormalities.  Hospital course was prolonged intubation requiring tracheostomy 01/29/2021 per Dr. Merrily Pew.  Vascular surgery Dr. Chestine Spore follow-up for right common carotid lesion to be addressed as outpatient.  Patient is currently n.p.o. with alternative means of nutritional support.  Bouts of restlessness agitation wean from Precedex and placed on Seroquel.  Maintained on low-dose aspirin as well as Eliquis for CVA prophylaxis.  Therapy evaluations completed due to patient decreased functional mobility recommendations of physical medicine rehab consult.   Review of Systems  Unable to perform ROS: Acuity of condition   Past Medical History:  Diagnosis Date  . A-fib  (HCC)   . GERD (gastroesophageal reflux disease)   . HTN (hypertension)   . Nephrolithiasis   . PAD (peripheral artery disease) (HCC)   . Stroke [facility])    Past Surgical History:  Procedure Laterality Date  . AORTOGRAM    . CATARACT EXTRACTION, BILATERAL    . CHOLECYSTECTOMY     Family History  Problem Relation Age of Onset  . Hypertension Father   . Diabetes Father   . Cancer Father    Social History:  reports that he has been smoking. He has never used smokeless tobacco. He reports previous alcohol use. No history on file for drug use. Allergies: No Known Allergies Medications Prior to Admission  Medication Sig Dispense Refill  . ALPRAZolam (XANAX) 0.5 MG tablet Take 0.5 mg by mouth 2 (two) times daily.    Marland Kitchen amLODipine (NORVASC) 5 MG tablet Take 5 mg by mouth daily.    Marland Kitchen atorvastatin (LIPITOR) 40 MG tablet Take 40 mg by mouth daily. for cholesterol    . azelastine (OPTIVAR) 0.05 % ophthalmic solution Place 1 drop into both eyes 2 (two) times daily.    . cephALEXin (KEFLEX) 500 MG capsule Take 500 mg by mouth 2 (two) times daily. For seven days    . citalopram (CELEXA) 20 MG tablet Take 20 mg by mouth daily.    . DULoxetine (CYMBALTA) 60 MG capsule Take 60 mg by mouth daily.    . famotidine (PEPCID) 20 MG tablet Take 20 mg by mouth 2 (two) times daily.    . furosemide (LASIX) 20 MG tablet Take 20 mg by mouth daily.    Marland Kitchen lisinopril (ZESTRIL) 20 MG tablet Take 20 mg by mouth daily.  Home: Home Living Family/patient expects to be discharged to:: Other (Comment) Additional Comments: No family present and unable to gather from patient due to vent at this time. Pt able to mouth several times but can be difficult to understand  Functional History: Prior Function Comments: unknown Functional Status:  Mobility: Bed Mobility Overal bed mobility: Needs Assistance Bed Mobility: Rolling,Supine to Sit Rolling: Min assist,+2 for safety/equipment Supine to sit: Min assist,+2 for  safety/equipment Sit to supine: +2 for physical assistance,Mod assist General bed mobility comments: pt requires cues to slow movement, but was restless and able to initiate roll to either side of bed without assist. minA to safely manage lines, minA to elevate trunk from HOB, +2 for safety and line management Transfers Overall transfer level: Needs assistance Equipment used: 2 person hand held assist Transfers: Sit to/from [employer] Sit to Stand: +2 physical assistance,Mod assist Stand pivot transfers: Mod assist,+2 physical assistance General transfer comment: +2 stand-pivot to recliner on L, pt taking small pivoting steps to recliner with HHA. Ambulation/Gait General Gait Details: 3 lateral steps to recliner only    ADL: ADL Overall ADL's : Needs assistance/impaired Eating/Feeding: NPO Grooming: Moderate assistance,Sitting Grooming Details (indicate cue type and reason): wash face with R UE Upper Body Bathing: Maximal assistance Lower Body Bathing: Total assistance Upper Body Dressing : Maximal assistance Lower Body Dressing: Total assistance Lower Body Dressing Details (indicate cue type and reason): lifting BIL LE alternating to have socks don appropriatel Toilet Transfer: Maximal assistance,+2 for physical assistance,Stand-pivot Toilet Transfer Details (indicate cue type and reason): simualted EOB to chair Toileting- Clothing Manipulation and Hygiene: Total assistance General ADL Comments: pt reports liking being in the chair  Cognition: Cognition Overall Cognitive Status: Impaired/Different from baseline Arousal/Alertness: Awake/alert Orientation Level: Intubated/Tracheostomy - Unable to assess Attention: Sustained Sustained Attention: Impaired Sustained Attention Impairment: Verbal basic Memory: Impaired Memory Impairment: Decreased recall of new information Awareness: Impaired Awareness Impairment: Intellectual impairment,Emergent  impairment,Anticipatory impairment Problem Solving: Impaired Problem Solving Impairment: Verbal basic Behaviors: Restless,Impulsive Safety/Judgment: Impaired Cognition Arousal/Alertness: Awake/alert Behavior During Therapy: Restless,Impulsive Overall Cognitive Status: Impaired/Different from baseline Area of Impairment: Following commands,Awareness Orientation Level: Disoriented to,Place,Time,Situation Current Attention Level: Sustained Memory: Decreased recall of precautions Following Commands: Follows one step commands with increased time,Follows one step commands inconsistently General Comments: pt restless, responding to name as well as simple cues such as "wait" despite making it clear he wants to move. Pt indicating he would like cortak removed removed multiple times through session, but remined redirectable. Difficult to assess due to: Tracheostomy  Blood pressure 138/65, pulse 91, temperature 99 F (37.2 C), temperature source Axillary, resp. rate (!) 21, height 5\' 2"  (1.575 m), weight 61 kg, SpO2 94 %. Physical Exam Neck:     Comments: Tracheostomy tube in place. Neurological:     Comments: Patient is alert.  Tracheostomy tube in place.  Follows simple commands.  Examination overall limited.     Results for orders placed or performed during the hospital encounter of 01/19/21 (from the past 24 hour(s))  Glucose, capillary     Status: Abnormal   Collection Time: 02/01/21  8:25 AM  Result Value Ref Range   Glucose-Capillary 103 (H) 70 - 99 mg/dL   Comment 1 Notify RN    Comment 2 Document in Chart   Glucose, capillary     Status: Abnormal   Collection Time: 02/01/21 11:51 AM  Result Value Ref Range   Glucose-Capillary 118 (H) 70 - 99 mg/dL   Comment 1 Notify  RN    Comment 2 Document in Chart   Glucose, capillary     Status: Abnormal   Collection Time: 02/01/21  4:16 PM  Result Value Ref Range   Glucose-Capillary 138 (H) 70 - 99 mg/dL   Comment 1 Notify RN    Comment  2 Document in Chart   Glucose, capillary     Status: None   Collection Time: 02/01/21  8:06 PM  Result Value Ref Range   Glucose-Capillary 97 70 - 99 mg/dL  Glucose, capillary     Status: Abnormal   Collection Time: 02/02/21  3:37 AM  Result Value Ref Range   Glucose-Capillary 112 (H) 70 - 99 mg/dL   No results found.  ***  Adam Rossetti Itati Brocksmith, PA-C 02/02/2021

## 2021-02-02 NOTE — Progress Notes (Signed)
PT Cancellation Note  Patient Details Name: Adam Mcclain MRN: [id number] DOB: Apr 23, 1951   Cancelled Treatment:    Reason Eval/Treat Not Completed: Other (comment) per case manager, patient discharging to [facility] today. Holding for now per dept protocol. Thank you for the opportunity to participate in his care!    Madelaine Etienne, DPT, PN1   Supplemental Physical Therapist [place]    Pager [phone] Acute Rehab Office [phone]

## 2021-02-02 NOTE — Care Management Important Message (Signed)
Important Message  Patient Details  Name: Adam Mcclain MRN: [id number] Date of Birth: 20-Apr-1951   Medicare Important Message Given:  Yes - Important Message mailed due to current National Emergency  Verbal consent obtained due to current National Emergency  Relationship to patient: Self Contact Name: Demontay Call Date: 02/02/21  Time: 1130 Phone: [phone] Outcome: No Answer/Busy Important Message mailed to: Patient address on file     Orson Aloe 02/02/2021, 11:30 AM

## 2021-02-02 NOTE — Progress Notes (Addendum)
Patient was transferred to [facility] service on 02/02/2021 and he was seen and examined however he received a bed at [facility] this AM.  Because he had a prolonged hospitalization and remained in ICU for 13 days Critical Care was reconsulted and they evaluated and deemed the patient stable to be discharged to [facility] and have done the discharge paperwork for the patient.

## 2021-02-02 NOTE — TOC Transition Note (Signed)
Transition of Care [facility]) - CM/SW Discharge Note   Patient Details  Name: Adam Mcclain MRN: [id number] Date of Birth: Oct 26, 1950  [facility] CM/SW Contact:  Mearl Latin, LCSW Phone Number: 02/02/2021, 4:27 PM   Clinical Narrative:    Per [facility], they can accept patient today and she has completed consents with family.   RN to call report to [phone], room 5 about 30 minutes prior to transport. Receiving MD is Hijazi. Patient can transport after 5pm.     Final next level of care: Long Term Acute Care (LTAC) Barriers to Discharge: Barriers Resolved   Patient Goals and CMS Choice   CMS Medicare.gov Compare Post Acute Care list provided to:: Patient Represenative (must comment) Choice offered to / list presented to : Sibling  Discharge Placement              Patient chooses bed at:  ([facility])   Name of family member notified: Brother Patient and family notified of of transfer: 02/02/21  Discharge Plan and Services   Discharge Planning Services: CM Consult Post Acute Care Choice: Long Term Acute Care (LTAC)                               Social Determinants of Health (SDOH) Interventions     Readmission Risk Interventions No flowsheet data found.

## 2021-02-02 NOTE — Discharge Instructions (Signed)

## 2021-02-02 NOTE — Progress Notes (Signed)
   02/02/21 1702  Assess: MEWS Score  Pulse Rate 97  Resp (!) 30  SpO2 97 %  O2 Device Tracheostomy Collar  O2 Flow Rate (L/min) 5 L/min  FiO2 (%) 28 %  Assess: MEWS Score  MEWS Temp 0  MEWS Systolic 0  MEWS Pulse 0  MEWS RR 2  MEWS LOC 0  MEWS Score 2  MEWS Score Color Yellow  Assess: if the MEWS score is Yellow or Red  Were vital signs taken at a resting state? Yes  Focused Assessment No change from prior assessment  Early Detection of Sepsis Score *See Row Information* Low  MEWS guidelines implemented *See Row Information* Yes  Treat  MEWS Interventions Administered prn meds/treatments  Pain Scale 0-10  Pain Score 0  Take Vital Signs  Increase Vital Sign Frequency  Yellow: Q 2hr X 2 then Q 4hr X 2, if remains yellow, continue Q 4hrs  Escalate  MEWS: Escalate Yellow: discuss with charge nurse/RN and consider discussing with provider and RRT  Notify: Charge Nurse/RN  Name of Charge Nurse/RN Notified Erin RN  Date Charge Nurse/RN Notified 02/02/21  Time Charge Nurse/RN Notified 1827  Document  Patient Outcome Stabilized after interventions  Progress note created (see row info) Yes  Assess: SIRS CRITERIA  SIRS Temperature  0  SIRS Pulse 1  SIRS Respirations  1  SIRS WBC 1  SIRS Score Sum  3

## 2021-02-02 NOTE — Discharge Summary (Signed)
Physician Discharge Summary  Patient ID: Adam Mcclain MRN: [id number] DOB/AGE: 06/05/1951 70 y.o.  Admit date: 01/19/2021 Discharge date: 02/02/2021  Problem List Active Problems:   Seizure (HCC)   Pressure injury of skin   Cerebral thrombosis with cerebral infarction   Protein-calorie malnutrition, severe  HPI: 70 year old male with past medical history as below, which is significant for CVA, HTN, afibrillation on aspirin and Plavix, COPD, and CKD.  In the midday hours of 5/6 he had accompanied by family member to a doctor's appointment.  He was waiting in the car when a bystander walked by and noticed he had a gaze deviation and was shaking.  EMS was called.  Upon EMS arrival the patient was unresponsive and was administered 10 mg of Versed.  He was bagged in route to the emergency department where he was promptly intubated on presentation.  The ED physician did note some rigidity in the upper extremities concerning for seizure activity.  CT of the head was nonacute.  The patient was evaluated by neurology who provided a loading dose of Keppra and started EEG monitoring.  PCCM was asked to admit the patient due to the requirement of mechanical ventilation.  Hospital Course:   5/6 admit to [facility] for seizures on the ventilator.  5/7 EEG shows no seizure activity  5/8 propofol was turned off, patient is spiked fever but white count trended down  5/9 EEG was negative for seizures, MRI was done showed very small right parietal stroke  5/10 trial of extubation, patient is struggled had to be reintubated  5/11 MRI brain showed subcentimeter new stroke  5/12 tolerated pressure support, extubated and had to be reintubated because of respiratory distress  5/13 patient was a scheduled for tracheostomy but he self extubated early this morning at shift change  5/14 reintubated overnight for uncontrolled WOB and inability to manage secretions. Plavix stopped in prep for trach.  Became  bradycardic while on Precedex, dose was decreased and he was started on Versed  5/16 tracheostomy was uneventful  [facility]  General: kin: no rashes or lesions   General: Cachectic male who is in wrist restraints in chest restraints due to agitation and pulling this tracheal mask off HEENT: #6 trach is in place he is on 21% trach collar minified.  Noted to have 1 tooth in his head. Neuro: Does not really follow commands.  Orientated at times.  Easily agitated. CV: Heart sounds are distant PULM: Coarse rhonchi bilaterally GI: soft, bsx4 active tube feeding via nasal feeding tube in place GU: Condom cath is in place Extremities: warm/dry, negative edema edema  Skin: no rashes or lesions  Assessment & Plan:  New onset Seizure from previous L PCA infarction Acute right parietal stroke -Con't keppra, appreciate neurology's recommendations -May need TEE prior to discharge to assess for LAA thrombus -on aspirin 81mg , Eliquis and atorvastatin for secondary stroke prevention -Con't to hold wellbutrin, probably should not be restarted due to seizure risk  Acute hypoxemic respiratory failure S/P tracheostomy - saturated and comfortable on room air - watch for weaning off from tracheostomy - in the setting of seizure and aspiration pneumonia with Moraxella catarrhalis.  - Completed 7-day IV Unasyn   Agitation - on seroquel - avoid precedex due to its hypotension as the side effect - encourage day time activity to reduce agitation  -Remains on Klonopin  Bilateral carotid stenosis - Carotid vascular ultrasound showed severe right common carotid artery  stenosis 80%, moderate to moderate stenosis on the left common carotid 50% - Appreciate vascular consult for outpatient follow up  - on secondary prevention of stroke -See note per vascular pain specialist  Paroxysmal atrial fibrillation - on Eliquis  HTN - start  amlodipine - Holding home antihypertensives including hydrochlorothiazide and lisinopril  Increased creatinine Lab Results  Component Value Date   CREATININE 1.19 02/02/2021   CREATININE 1.32 (H) 01/31/2021   CREATININE 1.39 (H) 01/30/2021   Creatinine has normalized  Anemia (normocytic, normochromic) Recent Labs    01/31/21 0026 02/02/21 0846  HGB 10.4* 11.5*     Transfuse for protocol  Leukocytosis - WBC trends up 11.5> 13 - one febrile episode; thick secretion - trend CBC - respiratory culture positive for Moraxella catarrhalis  Bibasilar atelectasis - encourage deep breathing once off ventilators - incentive spirometry once off ventilators  Hypertriglyceridemia - Continue atorvastatin - Avoid Cleviprex & propofol   COPD  - history documented.  - No associated home meds listed.  - No evidence of acute exacerbation.  - PRN Duonebs; if needing frequently can add long-acting BDs   Labs at discharge Lab Results  Component Value Date   CREATININE 1.19 02/02/2021   BUN 27 (H) 02/02/2021   NA 137 02/02/2021   K 4.3 02/02/2021   CL 106 02/02/2021   CO2 24 02/02/2021   Lab Results  Component Value Date   WBC 12.6 (H) 02/02/2021   HGB 11.5 (L) 02/02/2021   HCT 35.0 (L) 02/02/2021   MCV 96.7 02/02/2021   PLT 315 02/02/2021   Lab Results  Component Value Date   ALT 81 (H) 02/02/2021   AST 48 (H) 02/02/2021   ALKPHOS 87 02/02/2021   BILITOT 0.4 02/02/2021   No results found for: INR, PROTIME  Current radiology studies No results found.  Disposition:  Discharge disposition: 02-Transferred to [facility]       Discharge Instructions    Ambulatory referral to Neurology   Complete by: As directed    Follow up with Dr. Pearlean Brownie at [facility] in 4 weeks. Too complicated for NP to follow. Thanks.     Allergies as of 02/02/2021   No Known Allergies     Medication List    STOP taking these medications   ALPRAZolam 0.5 MG tablet Commonly  known as: XANAX   azelastine 0.05 % ophthalmic solution Commonly known as: OPTIVAR   cephALEXin 500 MG capsule Commonly known as: KEFLEX   citalopram 20 MG tablet Commonly known as: CELEXA   DULoxetine 60 MG capsule Commonly known as: CYMBALTA   famotidine 20 MG tablet Commonly known as: PEPCID   furosemide 20 MG tablet Commonly known as: LASIX   lisinopril 20 MG tablet Commonly known as: ZESTRIL     TAKE these medications   acetaminophen 325 MG tablet Commonly known as: TYLENOL Place 2 tablets (650 mg total) into feeding tube every 6 (six) hours as needed for fever.   amLODipine 10 MG tablet Commonly known as: NORVASC Place 1 tablet (10 mg total) into feeding tube daily. Start taking on: Feb 03, 2021 What changed:   medication strength  how much to take  how to take this   apixaban 5 MG Tabs tablet Commonly known as: ELIQUIS Place 1 tablet (5 mg total) into feeding tube 2 (two) times daily.   aspirin 81 MG chewable tablet Place 1 tablet (81 mg total) into feeding tube daily. Start taking on: Feb 03, 2021   atorvastatin 40  MG tablet Commonly known as: LIPITOR Place 1 tablet (40 mg total) into feeding tube daily. Start taking on: Feb 03, 2021 What changed:   how to take this  additional instructions   clonazePAM 0.5 MG tablet Commonly known as: KLONOPIN Place 1 tablet (0.5 mg total) into feeding tube at bedtime.   cyanocobalamin 1000 MCG tablet Place 1 tablet (1,000 mcg total) into feeding tube daily. Start taking on: Feb 03, 2021   feeding supplement (OSMOLITE 1.5 CAL) Liqd Place 1,000 mLs into feeding tube continuous.   feeding supplement (PROSource TF) liquid Place 45 mLs into feeding tube 2 (two) times daily.   folic acid 1 MG tablet Commonly known as: FOLVITE Place 1 tablet (1 mg total) into feeding tube daily. Start taking on: Feb 03, 2021   ipratropium-albuterol 0.5-2.5 (3) MG/3ML Soln Commonly known as: DUONEB Take 3 mLs by  nebulization every 6 (six) hours as needed.   levETIRAcetam 100 MG/ML solution Commonly known as: KEPPRA Place 10 mLs (1,000 mg total) into feeding tube 2 (two) times daily.   multivitamin with minerals Tabs tablet Place 1 tablet into feeding tube daily. Start taking on: Feb 03, 2021   pantoprazole sodium 40 mg/20 mL Pack Commonly known as: PROTONIX Place 20 mLs (40 mg total) into feeding tube daily. Start taking on: Feb 03, 2021   QUEtiapine 100 MG tablet Commonly known as: SEROQUEL Place 1 tablet (100 mg total) into feeding tube 2 (two) times daily.   thiamine 100 MG/ML injection Commonly known as: B-1 Inject 1 mL (100 mg total) into the vein daily. Start taking on: Feb 03, 2021       Follow-up Information    Micki Riley, MD. Schedule an appointment as soon as possible for a visit in 4 week(s).   Specialties: Neurology, Radiology Contact information: [address] [place] [address] [phone]                Discharged Condition: fair  Time spent on discharge  40 minutes.  Vital signs at Discharge. Temp:  [98.3 F (36.8 C)-99.4 F (37.4 C)] 98.3 F (36.8 C) (05/20 0742) Pulse Rate:  [87-217] 87 (05/20 0833) Resp:  [15-32] 26 (05/20 0833) BP: (110-153)/(52-136) 131/52 (05/20 0833) SpO2:  [92 %-100 %] 94 % (05/20 0833) FiO2 (%):  [28 %] 28 % (05/20 0102) Office follow up Special Information or instructions.  Signed: Brett Canales Jeshua Ransford ACNP Acute Care Nurse Practitioner [facility] Please consult Amion 02/02/2021, 11:10 AM

## 2021-02-02 NOTE — Progress Notes (Addendum)
  Progress Note    02/02/2021 7:23 AM Hospital Day 13  Subjective:  Laying in bed comfortably but in restraints  Tm 99.4  Vitals:   02/01/21 2238 02/02/21 0413  BP:  138/65  Pulse: 98 91  Resp: (!) 24 (!) 21  Temp:  99 F (37.2 C)  SpO2: 95% 94%    Physical Exam: General:  No distress Lungs:  On TC Extremities:  Left hand grip 3/5 right hand grip 5/5  CBC    Component Value Date/Time   WBC 13.0 (H) 01/31/2021 0026   RBC 3.29 (L) 01/31/2021 0026   HGB 10.4 (L) 01/31/2021 0026   HCT 32.2 (L) 01/31/2021 0026   PLT 255 01/31/2021 0026   MCV 97.9 01/31/2021 0026   MCH 31.6 01/31/2021 0026   MCHC 32.3 01/31/2021 0026   RDW 13.5 01/31/2021 0026   LYMPHSABS 0.9 01/21/2021 0549   MONOABS 0.7 01/21/2021 0549   EOSABS 0.0 01/21/2021 0549   BASOSABS 0.0 01/21/2021 0549    BMET    Component Value Date/Time   NA 139 01/31/2021 0026   K 4.1 01/31/2021 0026   CL 109 01/31/2021 0026   CO2 24 01/31/2021 0026   GLUCOSE 117 (H) 01/31/2021 0026   BUN 24 (H) 01/31/2021 0026   CREATININE 1.32 (H) 01/31/2021 0026   CALCIUM 8.2 (L) 01/31/2021 0026   GFRNONAA 58 (L) 01/31/2021 0026    INR No results found for: INR   Intake/Output Summary (Last 24 hours) at 02/02/2021 9476 Last data filed at 02/02/2021 0000 Gross per 24 hour  Intake 563.24 ml  Output 2350 ml  Net -1786.76 ml     Assessment/Plan:  70 y.o. male with right sided CVA Hospital Day 13  -pt now on floor out of ICU -plan is to recover from this hospitalization and f/u in our office to discuss right CCA lesion   Doreatha Massed, PA-C [facility] [phone] 02/02/2021 7:23 AM   I have seen and evaluated the patient. I agree with the PA note as documented above. I have arranged outpatient follow-up in one month after previous discussion with neurology to follow right CCA 80% stenosis - likely clamp injury given previous right CEA.  Other etiologies for stroke as well with afib not on  anticoagulation and small vessel intra-cranial disease.  Cephus Shelling, MD [facility] [place] Office: [phone]

## 2021-02-02 NOTE — Progress Notes (Signed)
Gave report to [facility] RN. No questions at this time. Request for IV to be left in.

## 2021-02-03 ENCOUNTER — Other Ambulatory Visit (HOSPITAL_COMMUNITY): Payer: Medicare Other

## 2021-02-03 LAB — CBC
HCT: 38.3 % — ABNORMAL LOW (ref 39.0–52.0)
Hemoglobin: 12.7 g/dL — ABNORMAL LOW (ref 13.0–17.0)
MCH: 31.8 pg (ref 26.0–34.0)
MCHC: 33.2 g/dL (ref 30.0–36.0)
MCV: 95.8 fL (ref 80.0–100.0)
Platelets: 386 10*3/uL (ref 150–400)
RBC: 4 MIL/uL — ABNORMAL LOW (ref 4.22–5.81)
RDW: 12.9 % (ref 11.5–15.5)
WBC: 12.7 10*3/uL — ABNORMAL HIGH (ref 4.0–10.5)
nRBC: 0 % (ref 0.0–0.2)

## 2021-02-03 LAB — COMPREHENSIVE METABOLIC PANEL
ALT: 118 U/L — ABNORMAL HIGH (ref 0–44)
AST: 71 U/L — ABNORMAL HIGH (ref 15–41)
Albumin: 2.6 g/dL — ABNORMAL LOW (ref 3.5–5.0)
Alkaline Phosphatase: 94 U/L (ref 38–126)
Anion gap: 12 (ref 5–15)
BUN: 26 mg/dL — ABNORMAL HIGH (ref 8–23)
CO2: 25 mmol/L (ref 22–32)
Calcium: 8.7 mg/dL — ABNORMAL LOW (ref 8.9–10.3)
Chloride: 101 mmol/L (ref 98–111)
Creatinine, Ser: 1.43 mg/dL — ABNORMAL HIGH (ref 0.61–1.24)
GFR, Estimated: 52 mL/min — ABNORMAL LOW (ref >60)
Glucose, Bld: 118 mg/dL — ABNORMAL HIGH (ref 70–99)
Potassium: 4 mmol/L (ref 3.5–5.1)
Sodium: 138 mmol/L (ref 135–145)
Total Bilirubin: 0.9 mg/dL (ref 0.3–1.2)
Total Protein: 7.1 g/dL (ref 6.5–8.1)

## 2021-02-03 LAB — PROTIME-INR
INR: 1.4 — ABNORMAL HIGH (ref 0.8–1.2)
Prothrombin Time: 16.8 seconds — ABNORMAL HIGH (ref 11.4–15.2)

## 2021-02-04 ENCOUNTER — Encounter: Payer: Self-pay | Admitting: Internal Medicine

## 2021-02-04 DIAGNOSIS — G40909 Epilepsy, unspecified, not intractable, without status epilepticus: Secondary | ICD-10-CM

## 2021-02-04 DIAGNOSIS — Z930 Tracheostomy status: Secondary | ICD-10-CM

## 2021-02-04 DIAGNOSIS — I639 Cerebral infarction, unspecified: Secondary | ICD-10-CM

## 2021-02-04 DIAGNOSIS — J9621 Acute and chronic respiratory failure with hypoxia: Secondary | ICD-10-CM | POA: Diagnosis not present

## 2021-02-04 DIAGNOSIS — I480 Paroxysmal atrial fibrillation: Secondary | ICD-10-CM | POA: Diagnosis not present

## 2021-02-04 LAB — BASIC METABOLIC PANEL
Anion gap: 10 (ref 5–15)
BUN: 35 mg/dL — ABNORMAL HIGH (ref 8–23)
CO2: 28 mmol/L (ref 22–32)
Calcium: 8.5 mg/dL — ABNORMAL LOW (ref 8.9–10.3)
Chloride: 100 mmol/L (ref 98–111)
Creatinine, Ser: 1.36 mg/dL — ABNORMAL HIGH (ref 0.61–1.24)
GFR, Estimated: 56 mL/min — ABNORMAL LOW (ref >60)
Glucose, Bld: 120 mg/dL — ABNORMAL HIGH (ref 70–99)
Potassium: 3.9 mmol/L (ref 3.5–5.1)
Sodium: 138 mmol/L (ref 135–145)

## 2021-02-04 NOTE — Consult Note (Signed)
Pulmonary Critical Care Medicine [facility] [place]  PULMONARY SERVICE  Date of Service: 02/04/2021  PULMONARY CRITICAL CARE CONSULT   Adam Mcclain  HAL:[id number]  DOB: 06/14/1951   DOA: 02/02/2021  Referring Physician: Carron Curie, MD  HPI: Adam Mcclain is a 70 y.o. male seen for follow up of Acute on Chronic Respiratory Failure.  Patient's chart reviewed discharge summary reviewed basically a past medical history significant for stroke hypertension atrial fibrillation COPD chronic kidney disease who apparently was having some eye deviation and shaking.  EMS was called patient at that time was felt to be unresponsive and patient did get 10 mg of Versed based on what is written in the chart.  On arrival he was bagged into the emergency room and was intubated at that time.  At the time of evaluation patient was noted to be somewhat rigid.  Neurology saw the patient was given Keppra as well as continuous EEG monitoring.  Patient was admitted to the ICU ended up intubated on the ventilator.  Subsequently was not able to come off the ventilator and had to be trached.  It appears that patient was active extubated on May 12 was reintubated because of respiratory distress and on the 13th patient self extubated but had to be reintubated again on the 14th patient was reintubated and subsequently tracheostomy was scheduled  Review of Systems:  ROS performed and is unremarkable other than noted above.  Past Medical History:  Diagnosis Date  . A-fib (HCC)   . GERD (gastroesophageal reflux disease)   . HTN (hypertension)   . Nephrolithiasis   . PAD (peripheral artery disease) (HCC)   . Stroke [facility])     Past Surgical History:  Procedure Laterality Date  . AORTOGRAM    . CATARACT EXTRACTION, BILATERAL    . CHOLECYSTECTOMY      Social History:    reports that he has been smoking. He has never used smokeless tobacco. He reports previous alcohol use. No history on file for drug  use.  Family History: Non-Contributory to the present illness  No Known Allergies  Medications: Reviewed on Rounds  Physical Exam:  Vitals: Temperature 97.8 pulse 74 respiratory rate is 28 blood pressure is 97/56 saturations 100%  Ventilator Settings off the ventilator right now on T collar  . General: Comfortable at this time . Eyes: Grossly normal lids, irises & conjunctiva . ENT: grossly tongue is normal . Neck: no obvious mass . Cardiovascular: S1-S2 normal no gallop or rub . Respiratory: No rhonchi coarse breath sound . Abdomen: Soft and nontender . Skin: no rash seen on limited exam . Musculoskeletal: not rigid . Psychiatric:unable to assess . Neurologic: no seizure no involuntary movements         Labs on Admission:  Basic Metabolic Panel: Recent Labs  Lab 01/30/21 0157 01/31/21 0026 02/02/21 0846 02/03/21 0342 02/04/21 0346  NA 141 139 137 138 138  K 4.2 4.1 4.3 4.0 3.9  CL 110 109 106 101 100  CO2 26 24 24 25 28   GLUCOSE 146* 117* 131* 118* 120*  BUN 26* 24* 27* 26* 35*  CREATININE 1.39* 1.32* 1.19 1.43* 1.36*  CALCIUM 7.9* 8.2* 8.4* 8.7* 8.5*  MG  --   --  2.2  --   --   PHOS  --   --  3.1  --   --     No results for input(s): PHART, PCO2ART, PO2ART, HCO3, O2SAT in the last 168 hours.  Liver Function Tests:  Recent Labs  Lab 01/29/21 0116 02/02/21 0846 02/03/21 0342  AST 38 48* 71*  ALT 66* 81* 118*  ALKPHOS 91 87 94  BILITOT 0.8 0.4 0.9  PROT 5.5* 6.3* 7.1  ALBUMIN 2.1* 2.4* 2.6*   No results for input(s): LIPASE, AMYLASE in the last 168 hours. No results for input(s): AMMONIA in the last 168 hours.  CBC: Recent Labs  Lab 01/29/21 0116 01/30/21 0157 01/31/21 0026 02/02/21 0846 02/03/21 0342  WBC 10.5 11.5* 13.0* 12.6* 12.7*  NEUTROABS  --   --   --  10.7*  --   HGB 12.2* 10.7* 10.4* 11.5* 12.7*  HCT 37.8* 32.5* 32.2* 35.0* 38.3*  MCV 97.9 98.2 97.9 96.7 95.8  PLT 239 235 255 315 386    Cardiac Enzymes: No results for  input(s): CKTOTAL, CKMB, CKMBINDEX, TROPONINI in the last 168 hours.  BNP (last 3 results) No results for input(s): BNP in the last 8760 hours.  ProBNP (last 3 results) No results for input(s): PROBNP in the last 8760 hours.   Radiological Exams on Admission: DG Chest Port 1 View  Result Date: 02/03/2021 CLINICAL DATA:  Shortness of breath EXAM: PORTABLE CHEST 1 VIEW COMPARISON:  01/29/2021 FINDINGS: Tracheostomy and feeding tube remain in place, unchanged. No confluent airspace opacities or effusions. Heart is normal size. No visible effusions. No acute bony abnormality. IMPRESSION: No active disease. Electronically Signed   By: Charlett Nose M.D.   On: 02/03/2021 15:43   DG Abd Portable 1V  Result Date: 02/03/2021 CLINICAL DATA:  70 year old male enteric tube placement. EXAM: PORTABLE ABDOMEN - 1 VIEW COMPARISON:  01/25/2021. FINDINGS: Portable AP supine view at 0616 hours. NG type tube has been removed and enteric feeding tube is in place with tip at the level of the gastric antrum just to the right of midline. Mild gaseous distension of the stomach now. Other bowel gas pattern stable and within normal limits. Stable cholecystectomy clips. Stable lung bases. No acute osseous abnormality identified. IMPRESSION: Enteric feeding tube tip at the distal stomach. Advance 6 cm if post pyloric transit is desired. Electronically Signed   By: Odessa Fleming M.D.   On: 02/03/2021 07:06    Assessment/Plan Active Problems:   Acute on chronic respiratory failure with hypoxia (HCC)   Seizure disorder (HCC)   Acute stroke due to ischemia [facility])   Paroxysmal atrial fibrillation (HCC)   Tracheostomy status (HCC)   1. Acute on chronic respiratory failure with hypoxia at this time patient is off the vent on T collar the plan is going to be to continue with the T-piece.  We will continue with secretion management and pulmonary toilet. 2. Seizure disorder with acute parietal stroke right now as having no seizures  we are going to continue to monitor closely. 3. Atrial fibrillation rate controlled we will continue to monitor on telemetry.  Patient also has been on Eliquis. 4. Tracheostomy doing well right now secretions are the only issue that appears to be the problem we will continue to follow along closely  I have personally seen and evaluated the patient, evaluated laboratory and imaging results, formulated the assessment and plan and placed orders. The Patient requires high complexity decision making with multiple systems involvement.  Case was discussed on Rounds with the Respiratory Therapy Director and the Respiratory staff Time Spent  Yevonne Pax, MD [facility] Pulmonary Critical Care Medicine Sleep Medicine

## 2021-02-05 DIAGNOSIS — G40909 Epilepsy, unspecified, not intractable, without status epilepticus: Secondary | ICD-10-CM | POA: Diagnosis not present

## 2021-02-05 DIAGNOSIS — J9621 Acute and chronic respiratory failure with hypoxia: Secondary | ICD-10-CM | POA: Diagnosis not present

## 2021-02-05 DIAGNOSIS — I639 Cerebral infarction, unspecified: Secondary | ICD-10-CM | POA: Diagnosis not present

## 2021-02-05 DIAGNOSIS — I480 Paroxysmal atrial fibrillation: Secondary | ICD-10-CM | POA: Diagnosis not present

## 2021-02-05 LAB — CULTURE, RESPIRATORY W GRAM STAIN: Culture: NORMAL

## 2021-02-05 NOTE — Progress Notes (Signed)
Pulmonary Critical Care Medicine [facility] [place]   PULMONARY CRITICAL CARE SERVICE  PROGRESS NOTE     Adam Mcclain  ZGY:[id number]  DOB: [birth year]   DOA: 02/02/2021  Referring Physician: Carron Curie, MD  HPI: Adam Mcclain is a 70 y.o. male seen for follow up of Acute on Chronic Respiratory Failure.  Currently on T collar still has very copious secretions limiting from weaning  Medications: Reviewed on Rounds  Physical Exam:  Vitals: Temperature is 97.4 pulse 77 respiratory rate is 13 blood pressure is 144/87 saturations 100%  Ventilator Settings on T collar with an FiO2 of 28%  . General: Comfortable at this time . Eyes: Grossly normal lids, irises & conjunctiva . ENT: grossly tongue is normal . Neck: no obvious mass . Cardiovascular: S1 S2 normal no gallop . Respiratory: No rhonchi very coarse breath sound . Abdomen: soft . Skin: no rash seen on limited exam . Musculoskeletal: not rigid . Psychiatric:unable to assess . Neurologic: no seizure no involuntary movements         Lab Data:   Basic Metabolic Panel: Recent Labs  Lab 01/30/21 0157 01/31/21 0026 02/02/21 0846 02/03/21 0342 02/04/21 0346  NA 141 139 137 138 138  K 4.2 4.1 4.3 4.0 3.9  CL 110 109 106 101 100  CO2 26 24 24 25 28   GLUCOSE 146* 117* 131* 118* 120*  BUN 26* 24* 27* 26* 35*  CREATININE 1.39* 1.32* 1.19 1.43* 1.36*  CALCIUM 7.9* 8.2* 8.4* 8.7* 8.5*  MG  --   --  2.2  --   --   PHOS  --   --  3.1  --   --     ABG: No results for input(s): PHART, PCO2ART, PO2ART, HCO3, O2SAT in the last 168 hours.  Liver Function Tests: Recent Labs  Lab 02/02/21 0846 02/03/21 0342  AST 48* 71*  ALT 81* 118*  ALKPHOS 87 94  BILITOT 0.4 0.9  PROT 6.3* 7.1  ALBUMIN 2.4* 2.6*   No results for input(s): LIPASE, AMYLASE in the last 168 hours. No results for input(s): AMMONIA in the last 168 hours.  CBC: Recent Labs  Lab 01/30/21 0157 01/31/21 0026 02/02/21 0846 02/03/21 0342   WBC 11.5* 13.0* 12.6* 12.7*  NEUTROABS  --   --  10.7*  --   HGB 10.7* 10.4* 11.5* 12.7*  HCT 32.5* 32.2* 35.0* 38.3*  MCV 98.2 97.9 96.7 95.8  PLT 235 255 315 386    Cardiac Enzymes: No results for input(s): CKTOTAL, CKMB, CKMBINDEX, TROPONINI in the last 168 hours.  BNP (last 3 results) No results for input(s): BNP in the last 8760 hours.  ProBNP (last 3 results) No results for input(s): PROBNP in the last 8760 hours.  Radiological Exams: DG Chest Port 1 View  Result Date: 02/03/2021 CLINICAL DATA:  Shortness of breath EXAM: PORTABLE CHEST 1 VIEW COMPARISON:  01/29/2021 FINDINGS: Tracheostomy and feeding tube remain in place, unchanged. No confluent airspace opacities or effusions. Heart is normal size. No visible effusions. No acute bony abnormality. IMPRESSION: No active disease. Electronically Signed   By: 01/31/2021 M.D.   On: 02/03/2021 15:43    Assessment/Plan Active Problems:   Acute on chronic respiratory failure with hypoxia (HCC)   Seizure disorder (HCC)   Acute stroke due to ischemia [facility])   Paroxysmal atrial fibrillation (HCC)   Tracheostomy status (HCC)   1. Acute on chronic respiratory failure with hypoxia we will continue with T collar trials titrate  oxygen continue pulmonary toilet. 2. Seizure disorder no sign of active seizures 3. Acute stroke no change continue with supportive care 4. Paroxysmal atrial fibrillation rate controlled 5. Tracheostomy remains in place   I have personally seen and evaluated the patient, evaluated laboratory and imaging results, formulated the assessment and plan and placed orders. The Patient requires high complexity decision making with multiple systems involvement.  Rounds were done with the Respiratory Therapy Director and Staff therapists and discussed with nursing staff also.  Yevonne Pax, MD [facility] Pulmonary Critical Care Medicine Sleep Medicine

## 2021-02-06 DIAGNOSIS — I480 Paroxysmal atrial fibrillation: Secondary | ICD-10-CM | POA: Diagnosis not present

## 2021-02-06 DIAGNOSIS — G40909 Epilepsy, unspecified, not intractable, without status epilepticus: Secondary | ICD-10-CM | POA: Diagnosis not present

## 2021-02-06 DIAGNOSIS — I639 Cerebral infarction, unspecified: Secondary | ICD-10-CM | POA: Diagnosis not present

## 2021-02-06 DIAGNOSIS — J9621 Acute and chronic respiratory failure with hypoxia: Secondary | ICD-10-CM | POA: Diagnosis not present

## 2021-02-06 NOTE — Progress Notes (Signed)
Pulmonary Critical Care Medicine [facility] [place]   PULMONARY CRITICAL CARE SERVICE  PROGRESS NOTE     Adam Mcclain  SEG:[id number]  DOB: [birth year]   DOA: 02/02/2021  Referring Physician: Carron Curie, MD  HPI: Adam Mcclain is a 70 y.o. male seen for follow up of Acute on Chronic Respiratory Failure.  Patient right now is comfortable without distress on T collar has been on 28% FiO2  Medications: Reviewed on Rounds  Physical Exam:  Vitals: Temperature is 98.2 pulse 73 respiratory rate is 20 blood pressure is 99/47 saturations 100%  Ventilator Settings on T collar with an FiO2 of 28%  . General: Comfortable at this time . Eyes: Grossly normal lids, irises & conjunctiva . ENT: grossly tongue is normal . Neck: no obvious mass . Cardiovascular: S1 S2 normal no gallop . Respiratory: Scattered rhonchi expansion is equal . Abdomen: soft . Skin: no rash seen on limited exam . Musculoskeletal: not rigid . Psychiatric:unable to assess . Neurologic: no seizure no involuntary movements         Lab Data:   Basic Metabolic Panel: Recent Labs  Lab 01/31/21 0026 02/02/21 0846 02/03/21 0342 02/04/21 0346  NA 139 137 138 138  K 4.1 4.3 4.0 3.9  CL 109 106 101 100  CO2 24 24 25 28   GLUCOSE 117* 131* 118* 120*  BUN 24* 27* 26* 35*  CREATININE 1.32* 1.19 1.43* 1.36*  CALCIUM 8.2* 8.4* 8.7* 8.5*  MG  --  2.2  --   --   PHOS  --  3.1  --   --     ABG: No results for input(s): PHART, PCO2ART, PO2ART, HCO3, O2SAT in the last 168 hours.  Liver Function Tests: Recent Labs  Lab 02/02/21 0846 02/03/21 0342  AST 48* 71*  ALT 81* 118*  ALKPHOS 87 94  BILITOT 0.4 0.9  PROT 6.3* 7.1  ALBUMIN 2.4* 2.6*   No results for input(s): LIPASE, AMYLASE in the last 168 hours. No results for input(s): AMMONIA in the last 168 hours.  CBC: Recent Labs  Lab 01/31/21 0026 02/02/21 0846 02/03/21 0342  WBC 13.0* 12.6* 12.7*  NEUTROABS  --  10.7*  --   HGB 10.4* 11.5*  12.7*  HCT 32.2* 35.0* 38.3*  MCV 97.9 96.7 95.8  PLT 255 315 386    Cardiac Enzymes: No results for input(s): CKTOTAL, CKMB, CKMBINDEX, TROPONINI in the last 168 hours.  BNP (last 3 results) No results for input(s): BNP in the last 8760 hours.  ProBNP (last 3 results) No results for input(s): PROBNP in the last 8760 hours.  Radiological Exams: No results found.  Assessment/Plan Active Problems:   Acute on chronic respiratory failure with hypoxia (HCC)   Seizure disorder (HCC)   Acute stroke due to ischemia [facility])   Paroxysmal atrial fibrillation (HCC)   Tracheostomy status (HCC)   1. Acute on chronic respiratory failure hypoxia plan is going to be to continue with T collar weaning on 20% FiO2. 2. Seizure disorder no sign of active seizures 3. Acute stroke no change we will continue to monitor 4. Paroxysmal atrial fibrillation rate controlled 5. Tracheostomy will remain in place and be changed out to a #6 cuffless today and then start PMV   I have personally seen and evaluated the patient, evaluated laboratory and imaging results, formulated the assessment and plan and placed orders. The Patient requires high complexity decision making with multiple systems involvement.  Rounds were done with the Respiratory Therapy  Librarian, academic therapists and discussed with nursing staff also.  Allyne Gee, MD [facility] Pulmonary Critical Care Medicine Sleep Medicine

## 2021-02-07 DIAGNOSIS — I639 Cerebral infarction, unspecified: Secondary | ICD-10-CM | POA: Diagnosis not present

## 2021-02-07 DIAGNOSIS — I480 Paroxysmal atrial fibrillation: Secondary | ICD-10-CM | POA: Diagnosis not present

## 2021-02-07 DIAGNOSIS — J9621 Acute and chronic respiratory failure with hypoxia: Secondary | ICD-10-CM | POA: Diagnosis not present

## 2021-02-07 DIAGNOSIS — G40909 Epilepsy, unspecified, not intractable, without status epilepticus: Secondary | ICD-10-CM | POA: Diagnosis not present

## 2021-02-07 NOTE — Progress Notes (Signed)
Pulmonary Critical Care Medicine [facility] [place]   PULMONARY CRITICAL CARE SERVICE  PROGRESS NOTE     Adam Mcclain  IRC:[id number]  DOB: [birth year]   DOA: 02/02/2021  Referring Physician: Carron Curie, MD  HPI: Adam Mcclain is a 70 y.o. male seen for follow up of Acute on Chronic Respiratory Failure.  Patient on T collar has been on 28% FiO2 using the PMV  Medications: Reviewed on Rounds  Physical Exam:  Vitals: Temperature is 98.6 pulse 76 respiratory 20 blood pressure is 136/78 saturations 1%  Ventilator Settings patient is on T collar FiO2 28%  . General: Comfortable at this time . Eyes: Grossly normal lids, irises & conjunctiva . ENT: grossly tongue is normal . Neck: no obvious mass . Cardiovascular: S1 S2 normal no gallop . Respiratory: Scattered rhonchi expansion is equal . Abdomen: soft . Skin: no rash seen on limited exam . Musculoskeletal: not rigid . Psychiatric:unable to assess . Neurologic: no seizure no involuntary movements         Lab Data:   Basic Metabolic Panel: Recent Labs  Lab 02/02/21 0846 02/03/21 0342 02/04/21 0346  NA 137 138 138  K 4.3 4.0 3.9  CL 106 101 100  CO2 24 25 28   GLUCOSE 131* 118* 120*  BUN 27* 26* 35*  CREATININE 1.19 1.43* 1.36*  CALCIUM 8.4* 8.7* 8.5*  MG 2.2  --   --   PHOS 3.1  --   --     ABG: No results for input(s): PHART, PCO2ART, PO2ART, HCO3, O2SAT in the last 168 hours.  Liver Function Tests: Recent Labs  Lab 02/02/21 0846 02/03/21 0342  AST 48* 71*  ALT 81* 118*  ALKPHOS 87 94  BILITOT 0.4 0.9  PROT 6.3* 7.1  ALBUMIN 2.4* 2.6*   No results for input(s): LIPASE, AMYLASE in the last 168 hours. No results for input(s): AMMONIA in the last 168 hours.  CBC: Recent Labs  Lab 02/02/21 0846 02/03/21 0342  WBC 12.6* 12.7*  NEUTROABS 10.7*  --   HGB 11.5* 12.7*  HCT 35.0* 38.3*  MCV 96.7 95.8  PLT 315 386    Cardiac Enzymes: No results for input(s): CKTOTAL, CKMB, CKMBINDEX,  TROPONINI in the last 168 hours.  BNP (last 3 results) No results for input(s): BNP in the last 8760 hours.  ProBNP (last 3 results) No results for input(s): PROBNP in the last 8760 hours.  Radiological Exams: No results found.  Assessment/Plan Active Problems:   Acute on chronic respiratory failure with hypoxia (HCC)   Seizure disorder (HCC)   Acute stroke due to ischemia [facility])   Paroxysmal atrial fibrillation (HCC)   Tracheostomy status (HCC)   1. Acute on chronic respiratory failure hypoxia plan is going to be to continue with the T-piece use PMV as tolerated. 2. Seizure disorder no sign of active seizures. 3. Paroxysmal atrial fibrillation rate is controlled 4. Acute stroke due to ischemia we will continue to follow along. 5. Tracheostomy remains in place   I have personally seen and evaluated the patient, evaluated laboratory and imaging results, formulated the assessment and plan and placed orders. The Patient requires high complexity decision making with multiple systems involvement.  Rounds were done with the Respiratory Therapy Director and Staff therapists and discussed with nursing staff also.  [facility], MD [facility] Pulmonary Critical Care Medicine Sleep Medicine

## 2021-02-08 DIAGNOSIS — I639 Cerebral infarction, unspecified: Secondary | ICD-10-CM | POA: Diagnosis not present

## 2021-02-08 DIAGNOSIS — J9621 Acute and chronic respiratory failure with hypoxia: Secondary | ICD-10-CM | POA: Diagnosis not present

## 2021-02-08 DIAGNOSIS — G40909 Epilepsy, unspecified, not intractable, without status epilepticus: Secondary | ICD-10-CM | POA: Diagnosis not present

## 2021-02-08 DIAGNOSIS — I480 Paroxysmal atrial fibrillation: Secondary | ICD-10-CM | POA: Diagnosis not present

## 2021-02-08 LAB — VITAMIN B1: Vitamin B1 (Thiamine): 246 nmol/L — ABNORMAL HIGH (ref 66.5–200.0)

## 2021-02-08 NOTE — Progress Notes (Signed)
Pulmonary Critical Care Medicine [facility] [place]   PULMONARY CRITICAL CARE SERVICE  PROGRESS NOTE     DEMARIUS Mcclain  YPP:[id number]  DOB: 11-May-1951   DOA: 02/02/2021  Referring Physician: Carron Curie, MD  HPI: Adam TIGGES is a 70 y.o. male seen for follow up of Acute on Chronic Respiratory Failure.  Off the ventilator right now on T collar has been on room air tolerating PMV could be considered for capping today  Medications: Reviewed on Rounds  Physical Exam:  Vitals: Temperature is 96.8 pulse 76 respiratory 23 blood pressure is 145/82 saturations 96%  Ventilator Settings off the vent T collar room air with PMV  . General: Comfortable at this time . Eyes: Grossly normal lids, irises & conjunctiva . ENT: grossly tongue is normal . Neck: no obvious mass . Cardiovascular: S1 S2 normal no gallop . Respiratory: No rhonchi no rales are noted at this time . Abdomen: soft . Skin: no rash seen on limited exam . Musculoskeletal: not rigid . Psychiatric:unable to assess . Neurologic: no seizure no involuntary movements         Lab Data:   Basic Metabolic Panel: Recent Labs  Lab 02/02/21 0846 02/03/21 0342 02/04/21 0346  NA 137 138 138  K 4.3 4.0 3.9  CL 106 101 100  CO2 24 25 28   GLUCOSE 131* 118* 120*  BUN 27* 26* 35*  CREATININE 1.19 1.43* 1.36*  CALCIUM 8.4* 8.7* 8.5*  MG 2.2  --   --   PHOS 3.1  --   --     ABG: No results for input(s): PHART, PCO2ART, PO2ART, HCO3, O2SAT in the last 168 hours.  Liver Function Tests: Recent Labs  Lab 02/02/21 0846 02/03/21 0342  AST 48* 71*  ALT 81* 118*  ALKPHOS 87 94  BILITOT 0.4 0.9  PROT 6.3* 7.1  ALBUMIN 2.4* 2.6*   No results for input(s): LIPASE, AMYLASE in the last 168 hours. No results for input(s): AMMONIA in the last 168 hours.  CBC: Recent Labs  Lab 02/02/21 0846 02/03/21 0342  WBC 12.6* 12.7*  NEUTROABS 10.7*  --   HGB 11.5* 12.7*  HCT 35.0* 38.3*  MCV 96.7 95.8  PLT 315 386     Cardiac Enzymes: No results for input(s): CKTOTAL, CKMB, CKMBINDEX, TROPONINI in the last 168 hours.  BNP (last 3 results) No results for input(s): BNP in the last 8760 hours.  ProBNP (last 3 results) No results for input(s): PROBNP in the last 8760 hours.  Radiological Exams: No results found.  Assessment/Plan Active Problems:   Acute on chronic respiratory failure with hypoxia (HCC)   Seizure disorder (HCC)   Acute stroke due to ischemia [facility])   Paroxysmal atrial fibrillation (HCC)   Tracheostomy status (HCC)   1. Acute on chronic respiratory failure hypoxia patient continues to wean try with capping possibly 2. Seizure disorder no sign of active seizures 3. Acute stroke no change we will continue with supportive care 4. Paroxysmal atrial fibrillation rate is controlled 5. Tracheostomy remains in place   I have personally seen and evaluated the patient, evaluated laboratory and imaging results, formulated the assessment and plan and placed orders. The Patient requires high complexity decision making with multiple systems involvement.  Rounds were done with the Respiratory Therapy Director and Staff therapists and discussed with nursing staff also.  [facility], MD [facility] Pulmonary Critical Care Medicine Sleep Medicine

## 2021-02-09 LAB — CBC
HCT: 36.3 % — ABNORMAL LOW (ref 39.0–52.0)
Hemoglobin: 11.9 g/dL — ABNORMAL LOW (ref 13.0–17.0)
MCH: 31.5 pg (ref 26.0–34.0)
MCHC: 32.8 g/dL (ref 30.0–36.0)
MCV: 96 fL (ref 80.0–100.0)
Platelets: 304 10*3/uL (ref 150–400)
RBC: 3.78 MIL/uL — ABNORMAL LOW (ref 4.22–5.81)
RDW: 12 % (ref 11.5–15.5)
WBC: 7.3 10*3/uL (ref 4.0–10.5)
nRBC: 0 % (ref 0.0–0.2)

## 2021-02-09 LAB — BASIC METABOLIC PANEL
Anion gap: 5 (ref 5–15)
BUN: 28 mg/dL — ABNORMAL HIGH (ref 8–23)
CO2: 32 mmol/L (ref 22–32)
Calcium: 8.9 mg/dL (ref 8.9–10.3)
Chloride: 101 mmol/L (ref 98–111)
Creatinine, Ser: 1.28 mg/dL — ABNORMAL HIGH (ref 0.61–1.24)
GFR, Estimated: 60 mL/min — ABNORMAL LOW (ref >60)
Glucose, Bld: 133 mg/dL — ABNORMAL HIGH (ref 70–99)
Potassium: 4.2 mmol/L (ref 3.5–5.1)
Sodium: 138 mmol/L (ref 135–145)

## 2021-02-10 ENCOUNTER — Other Ambulatory Visit (HOSPITAL_COMMUNITY): Payer: Medicare Other

## 2021-02-11 DIAGNOSIS — G40909 Epilepsy, unspecified, not intractable, without status epilepticus: Secondary | ICD-10-CM | POA: Diagnosis not present

## 2021-02-11 DIAGNOSIS — I639 Cerebral infarction, unspecified: Secondary | ICD-10-CM | POA: Diagnosis not present

## 2021-02-11 DIAGNOSIS — J9621 Acute and chronic respiratory failure with hypoxia: Secondary | ICD-10-CM | POA: Diagnosis not present

## 2021-02-11 DIAGNOSIS — I480 Paroxysmal atrial fibrillation: Secondary | ICD-10-CM | POA: Diagnosis not present

## 2021-02-11 NOTE — Progress Notes (Signed)
Pulmonary Critical Care Medicine [facility] [place]   PULMONARY CRITICAL CARE SERVICE  PROGRESS NOTE     Adam Mcclain  GUR:[id number]  DOB: 01-Jan-1951   DOA: 02/02/2021  Referring Physician: Carron Curie, MD  HPI: Adam Mcclain is a 70 y.o. male seen for follow up of Acute on Chronic Respiratory Failure.  Right now is on T collar room air secretions are still copious that  Medications: Reviewed on Rounds  Physical Exam:  Vitals: Temperature is 98.0 pulse 81 respiratory 22 blood pressure is 122/88 saturations 98%  Ventilator Settings currently on T collar room air  . General: Comfortable at this time . Eyes: Grossly normal lids, irises & conjunctiva . ENT: grossly tongue is normal . Neck: no obvious mass . Cardiovascular: S1 S2 normal no gallop . Respiratory: No rhonchi very coarse breath sounds . Abdomen: soft . Skin: no rash seen on limited exam . Musculoskeletal: not rigid . Psychiatric:unable to assess . Neurologic: no seizure no involuntary movements         Lab Data:   Basic Metabolic Panel: Recent Labs  Lab 02/09/21 0346  NA 138  K 4.2  CL 101  CO2 32  GLUCOSE 133*  BUN 28*  CREATININE 1.28*  CALCIUM 8.9    ABG: No results for input(s): PHART, PCO2ART, PO2ART, HCO3, O2SAT in the last 168 hours.  Liver Function Tests: No results for input(s): AST, ALT, ALKPHOS, BILITOT, PROT, ALBUMIN in the last 168 hours. No results for input(s): LIPASE, AMYLASE in the last 168 hours. No results for input(s): AMMONIA in the last 168 hours.  CBC: Recent Labs  Lab 02/09/21 0346  WBC 7.3  HGB 11.9*  HCT 36.3*  MCV 96.0  PLT 304    Cardiac Enzymes: No results for input(s): CKTOTAL, CKMB, CKMBINDEX, TROPONINI in the last 168 hours.  BNP (last 3 results) No results for input(s): BNP in the last 8760 hours.  ProBNP (last 3 results) No results for input(s): PROBNP in the last 8760 hours.  Radiological Exams: DG Abd 1 View  Result Date:  02/10/2021 CLINICAL DATA:  Possible constipation EXAM: ABDOMEN - 1 VIEW COMPARISON:  02/03/2021 FINDINGS: Feeding catheter is again noted in the distal stomach. Scattered large and small bowel gas is noted. Mild retained fecal material is noted in the right colon although no constipation is noted. IMPRESSION: Feeding catheter in the distal stomach. No other focal abnormality is noted. Electronically Signed   By: Alcide Clever M.D.   On: 02/10/2021 18:48    Assessment/Plan Active Problems:   Acute on chronic respiratory failure with hypoxia (HCC)   Seizure disorder (HCC)   Acute stroke due to ischemia [facility])   Paroxysmal atrial fibrillation (HCC)   Tracheostomy status (HCC)   1. Acute on chronic respiratory failure with hypoxia plan is going to be to continue with T-piece titrate oxygen continue secretion management supportive care. 2. Seizure disorder no sign of active seizures we will continue to monitor 3. Acute stroke no change supportive care. 4. Paroxysmal atrial fibrillation rate is controlled 5. Tracheostomy will need to remain in place because of excessive secretions.   I have personally seen and evaluated the patient, evaluated laboratory and imaging results, formulated the assessment and plan and placed orders. The Patient requires high complexity decision making with multiple systems involvement.  Rounds were done with the Respiratory Therapy Director and Staff therapists and discussed with nursing staff also.  Yevonne Pax, MD [facility] Pulmonary Critical Care Medicine Sleep Medicine

## 2021-02-12 DIAGNOSIS — I639 Cerebral infarction, unspecified: Secondary | ICD-10-CM | POA: Diagnosis not present

## 2021-02-12 DIAGNOSIS — I480 Paroxysmal atrial fibrillation: Secondary | ICD-10-CM | POA: Diagnosis not present

## 2021-02-12 DIAGNOSIS — G40909 Epilepsy, unspecified, not intractable, without status epilepticus: Secondary | ICD-10-CM | POA: Diagnosis not present

## 2021-02-12 DIAGNOSIS — J9621 Acute and chronic respiratory failure with hypoxia: Secondary | ICD-10-CM | POA: Diagnosis not present

## 2021-02-12 NOTE — Progress Notes (Signed)
Pulmonary Critical Care Medicine [facility] [place]   PULMONARY CRITICAL CARE SERVICE  PROGRESS NOTE     Adam Mcclain  RKY:[id number]  DOB: 12/13/1950   DOA: 02/02/2021  Referring Physician: Carron Curie, MD  HPI: Adam Mcclain is a 70 y.o. male seen for follow up of Acute on Chronic Respiratory Failure.  Patient currently is afebrile without distress has been on T collar secretions are still very copious  Medications: Reviewed on Rounds  Physical Exam:  Vitals: Temperature 96.5 pulse 80 respiratory 34 blood pressure is 116/69 saturations 96%  Ventilator Settings on T collar  . General: Comfortable at this time . Eyes: Grossly normal lids, irises & conjunctiva . ENT: grossly tongue is normal . Neck: no obvious mass . Cardiovascular: S1 S2 normal no gallop . Respiratory: No rhonchi very coarse breath sounds . Abdomen: soft . Skin: no rash seen on limited exam . Musculoskeletal: not rigid . Psychiatric:unable to assess . Neurologic: no seizure no involuntary movements         Lab Data:   Basic Metabolic Panel: Recent Labs  Lab 02/09/21 0346  NA 138  K 4.2  CL 101  CO2 32  GLUCOSE 133*  BUN 28*  CREATININE 1.28*  CALCIUM 8.9    ABG: No results for input(s): PHART, PCO2ART, PO2ART, HCO3, O2SAT in the last 168 hours.  Liver Function Tests: No results for input(s): AST, ALT, ALKPHOS, BILITOT, PROT, ALBUMIN in the last 168 hours. No results for input(s): LIPASE, AMYLASE in the last 168 hours. No results for input(s): AMMONIA in the last 168 hours.  CBC: Recent Labs  Lab 02/09/21 0346  WBC 7.3  HGB 11.9*  HCT 36.3*  MCV 96.0  PLT 304    Cardiac Enzymes: No results for input(s): CKTOTAL, CKMB, CKMBINDEX, TROPONINI in the last 168 hours.  BNP (last 3 results) No results for input(s): BNP in the last 8760 hours.  ProBNP (last 3 results) No results for input(s): PROBNP in the last 8760 hours.  Radiological Exams: DG Abd 1  View  Result Date: 02/10/2021 CLINICAL DATA:  Possible constipation EXAM: ABDOMEN - 1 VIEW COMPARISON:  02/03/2021 FINDINGS: Feeding catheter is again noted in the distal stomach. Scattered large and small bowel gas is noted. Mild retained fecal material is noted in the right colon although no constipation is noted. IMPRESSION: Feeding catheter in the distal stomach. No other focal abnormality is noted. Electronically Signed   By: Alcide Clever M.D.   On: 02/10/2021 18:48    Assessment/Plan Active Problems:   Acute on chronic respiratory failure with hypoxia (HCC)   Seizure disorder (HCC)   Acute stroke due to ischemia [facility])   Paroxysmal atrial fibrillation (HCC)   Tracheostomy status (HCC)   1. Acute on chronic respiratory failure with hypoxia plan is going to be to continue to wean patient as tolerated.  Patient will be doing T collar because of excessive secretions has been limiting 2. Seizure disorder no sign of active seizures we will continue to monitor closely. 3. Acute stroke supportive care we will continue present management 4. Paroxysmal atrial fibrillation rate now rate is controlled 5. Tracheostomy is will remain in place   I have personally seen and evaluated the patient, evaluated laboratory and imaging results, formulated the assessment and plan and placed orders. The Patient requires high complexity decision making with multiple systems involvement.  Rounds were done with the Respiratory Therapy Director and Staff therapists and discussed with nursing staff also.  Alyza Artiaga  Richardson Dopp, MD [facility] Pulmonary Critical Care Medicine Sleep Medicine

## 2021-02-13 DIAGNOSIS — I639 Cerebral infarction, unspecified: Secondary | ICD-10-CM | POA: Diagnosis not present

## 2021-02-13 DIAGNOSIS — I480 Paroxysmal atrial fibrillation: Secondary | ICD-10-CM | POA: Diagnosis not present

## 2021-02-13 DIAGNOSIS — G40909 Epilepsy, unspecified, not intractable, without status epilepticus: Secondary | ICD-10-CM | POA: Diagnosis not present

## 2021-02-13 DIAGNOSIS — J9621 Acute and chronic respiratory failure with hypoxia: Secondary | ICD-10-CM | POA: Diagnosis not present

## 2021-02-13 NOTE — Progress Notes (Signed)
Pulmonary Critical Care Medicine [facility] [place]   PULMONARY CRITICAL CARE SERVICE  PROGRESS NOTE     Adam Mcclain  UXN:[id number]  DOB: 06/29/1951   DOA: 02/02/2021  Referring Physician: Carron Curie, MD  HPI: Adam Mcclain is a 70 y.o. male seen for follow up of Acute on Chronic Respiratory Failure.  Patient at this time is on T collar room air doing well secretions have significantly improved  Medications: Reviewed on Rounds  Physical Exam:  Vitals: Temperature is 97.7 pulse 82 respiratory 23 blood pressure is 174/96 saturations are 96%  Ventilator Settings on T collar room air  . General: Comfortable at this time . Eyes: Grossly normal lids, irises & conjunctiva . ENT: grossly tongue is normal . Neck: no obvious mass . Cardiovascular: S1 S2 normal no gallop . Respiratory: No rhonchi very coarse breath sound . Abdomen: soft . Skin: no rash seen on limited exam . Musculoskeletal: not rigid . Psychiatric:unable to assess . Neurologic: no seizure no involuntary movements         Lab Data:   Basic Metabolic Panel: Recent Labs  Lab 02/09/21 0346  NA 138  K 4.2  CL 101  CO2 32  GLUCOSE 133*  BUN 28*  CREATININE 1.28*  CALCIUM 8.9    ABG: No results for input(s): PHART, PCO2ART, PO2ART, HCO3, O2SAT in the last 168 hours.  Liver Function Tests: No results for input(s): AST, ALT, ALKPHOS, BILITOT, PROT, ALBUMIN in the last 168 hours. No results for input(s): LIPASE, AMYLASE in the last 168 hours. No results for input(s): AMMONIA in the last 168 hours.  CBC: Recent Labs  Lab 02/09/21 0346  WBC 7.3  HGB 11.9*  HCT 36.3*  MCV 96.0  PLT 304    Cardiac Enzymes: No results for input(s): CKTOTAL, CKMB, CKMBINDEX, TROPONINI in the last 168 hours.  BNP (last 3 results) No results for input(s): BNP in the last 8760 hours.  ProBNP (last 3 results) No results for input(s): PROBNP in the last 8760 hours.  Radiological Exams: No results  found.  Assessment/Plan Active Problems:   Acute on chronic respiratory failure with hypoxia (HCC)   Seizure disorder (HCC)   Acute stroke due to ischemia [facility])   Paroxysmal atrial fibrillation (HCC)   Tracheostomy status (HCC)   1. Acute on chronic respiratory failure with hypoxia we will continue with T collar trials titrate oxygen as tolerated continue pulmonary toilet. 2. Seizure disorder no sign of active seizures 3. Acute stroke patient is comfortable right now without distress 4. Paroxysmal atrial fibrillation rate is controlled 5. Tracheostomy remains in place   I have personally seen and evaluated the patient, evaluated laboratory and imaging results, formulated the assessment and plan and placed orders. The Patient requires high complexity decision making with multiple systems involvement.  Rounds were done with the Respiratory Therapy Director and Staff therapists and discussed with nursing staff also.  Yevonne Pax, MD [facility] Pulmonary Critical Care Medicine Sleep Medicine

## 2021-02-14 ENCOUNTER — Other Ambulatory Visit (HOSPITAL_COMMUNITY): Payer: Medicare Other

## 2021-02-14 DIAGNOSIS — I480 Paroxysmal atrial fibrillation: Secondary | ICD-10-CM | POA: Diagnosis not present

## 2021-02-14 DIAGNOSIS — G40909 Epilepsy, unspecified, not intractable, without status epilepticus: Secondary | ICD-10-CM | POA: Diagnosis not present

## 2021-02-14 DIAGNOSIS — J9621 Acute and chronic respiratory failure with hypoxia: Secondary | ICD-10-CM | POA: Diagnosis not present

## 2021-02-14 DIAGNOSIS — I639 Cerebral infarction, unspecified: Secondary | ICD-10-CM | POA: Diagnosis not present

## 2021-02-14 LAB — BASIC METABOLIC PANEL
Anion gap: 12 (ref 5–15)
BUN: 26 mg/dL — ABNORMAL HIGH (ref 8–23)
CO2: 28 mmol/L (ref 22–32)
Calcium: 9.1 mg/dL (ref 8.9–10.3)
Chloride: 97 mmol/L — ABNORMAL LOW (ref 98–111)
Creatinine, Ser: 1.21 mg/dL (ref 0.61–1.24)
GFR, Estimated: >60 mL/min (ref >60)
Glucose, Bld: 110 mg/dL — ABNORMAL HIGH (ref 70–99)
Potassium: 4 mmol/L (ref 3.5–5.1)
Sodium: 137 mmol/L (ref 135–145)

## 2021-02-14 LAB — CBC
HCT: 39.5 % (ref 39.0–52.0)
Hemoglobin: 13.2 g/dL (ref 13.0–17.0)
MCH: 31.5 pg (ref 26.0–34.0)
MCHC: 33.4 g/dL (ref 30.0–36.0)
MCV: 94.3 fL (ref 80.0–100.0)
Platelets: 192 10*3/uL (ref 150–400)
RBC: 4.19 MIL/uL — ABNORMAL LOW (ref 4.22–5.81)
RDW: 12.4 % (ref 11.5–15.5)
WBC: 7.4 10*3/uL (ref 4.0–10.5)
nRBC: 0 % (ref 0.0–0.2)

## 2021-02-14 NOTE — Progress Notes (Signed)
Pulmonary Critical Care Medicine [facility] [place]   PULMONARY CRITICAL CARE SERVICE  PROGRESS NOTE     Adam Mcclain  UUE:[id number]  DOB: 1951-08-12   DOA: 02/02/2021  Referring Physician: Carron Curie, MD  HPI: Adam Mcclain is a 70 y.o. male seen for follow up of Acute on Chronic Respiratory Failure.  Currently is capping on room air doing fairly well.  Patient is supposed to have a swallowing assessment done  Medications: Reviewed on Rounds  Physical Exam:  Vitals: Temperature is 98.8 pulse 94 respiratory 20 blood pressure 150/90 saturations 100%  Ventilator Settings capping on room air  . General: Comfortable at this time . Eyes: Grossly normal lids, irises & conjunctiva . ENT: grossly tongue is normal . Neck: no obvious mass . Cardiovascular: S1 S2 normal no gallop . Respiratory: Scattered rhonchi expansion is equal . Abdomen: soft . Skin: no rash seen on limited exam . Musculoskeletal: not rigid . Psychiatric:unable to assess . Neurologic: no seizure no involuntary movements         Lab Data:   Basic Metabolic Panel: Recent Labs  Lab 02/09/21 0346 02/14/21 0500  NA 138 137  K 4.2 4.0  CL 101 97*  CO2 32 28  GLUCOSE 133* 110*  BUN 28* 26*  CREATININE 1.28* 1.21  CALCIUM 8.9 9.1    ABG: No results for input(s): PHART, PCO2ART, PO2ART, HCO3, O2SAT in the last 168 hours.  Liver Function Tests: No results for input(s): AST, ALT, ALKPHOS, BILITOT, PROT, ALBUMIN in the last 168 hours. No results for input(s): LIPASE, AMYLASE in the last 168 hours. No results for input(s): AMMONIA in the last 168 hours.  CBC: Recent Labs  Lab 02/09/21 0346 02/14/21 0500  WBC 7.3 7.4  HGB 11.9* 13.2  HCT 36.3* 39.5  MCV 96.0 94.3  PLT 304 192    Cardiac Enzymes: No results for input(s): CKTOTAL, CKMB, CKMBINDEX, TROPONINI in the last 168 hours.  BNP (last 3 results) No results for input(s): BNP in the last 8760 hours.  ProBNP (last 3  results) No results for input(s): PROBNP in the last 8760 hours.  Radiological Exams: No results found.  Assessment/Plan Active Problems:   Acute on chronic respiratory failure with hypoxia (HCC)   Seizure disorder (HCC)   Acute stroke due to ischemia [facility])   Paroxysmal atrial fibrillation (HCC)   Tracheostomy status (HCC)   1. Acute on chronic respiratory failure hypoxia plan is going to be to continue with capping trials as tolerated supposed to have a swallowing study as already mentioned 2. Seizure disorder no sign of active seizures 3. Acute stroke no change 4. Paroxysmal atrial fibrillation rate is controlled 5. Tracheostomy remains in place but is doing well with capping trials   I have personally seen and evaluated the patient, evaluated laboratory and imaging results, formulated the assessment and plan and placed orders. The Patient requires high complexity decision making with multiple systems involvement.  Rounds were done with the Respiratory Therapy Director and Staff therapists and discussed with nursing staff also.  Yevonne Pax, MD [facility] Pulmonary Critical Care Medicine Sleep Medicine

## 2021-02-15 ENCOUNTER — Encounter: Payer: Self-pay | Admitting: Internal Medicine

## 2021-02-15 DIAGNOSIS — I639 Cerebral infarction, unspecified: Secondary | ICD-10-CM | POA: Diagnosis not present

## 2021-02-15 DIAGNOSIS — J9621 Acute and chronic respiratory failure with hypoxia: Secondary | ICD-10-CM | POA: Diagnosis not present

## 2021-02-15 DIAGNOSIS — G40909 Epilepsy, unspecified, not intractable, without status epilepticus: Secondary | ICD-10-CM | POA: Diagnosis not present

## 2021-02-15 DIAGNOSIS — I480 Paroxysmal atrial fibrillation: Secondary | ICD-10-CM | POA: Diagnosis not present

## 2021-02-15 MED ORDER — CEFAZOLIN SODIUM-DEXTROSE 2-4 GM/100ML-% IV SOLN: 2.0000 g | INTRAVENOUS | Status: AC

## 2021-02-15 NOTE — Progress Notes (Signed)
Pulmonary Critical Care Medicine [facility] [place]   PULMONARY CRITICAL CARE SERVICE  PROGRESS NOTE     Adam Mcclain  XAJ:[id number]  DOB: [birth year]   DOA: 02/02/2021  Referring Physician: Carron Curie, MD  HPI: Adam Mcclain is a 70 y.o. male seen for follow up of Acute on Chronic Respiratory Failure.  Patient is capping currently on 48-hour goal.  Medications: Reviewed on Rounds  Physical Exam:  Vitals: Temperature is 98.6 pulse 85 respiratory 24 blood pressure is 147/82 saturations 98%  Ventilator Settings capping off the ventilator  . General: Comfortable at this time . Eyes: Grossly normal lids, irises & conjunctiva . ENT: grossly tongue is normal . Neck: no obvious mass . Cardiovascular: S1 S2 normal no gallop . Respiratory: Scattered rhonchi expansion is equal . Abdomen: soft . Skin: no rash seen on limited exam . Musculoskeletal: not rigid . Psychiatric:unable to assess . Neurologic: no seizure no involuntary movements         Lab Data:   Basic Metabolic Panel: Recent Labs  Lab 02/09/21 0346 02/14/21 0500  NA 138 137  K 4.2 4.0  CL 101 97*  CO2 32 28  GLUCOSE 133* 110*  BUN 28* 26*  CREATININE 1.28* 1.21  CALCIUM 8.9 9.1    ABG: No results for input(s): PHART, PCO2ART, PO2ART, HCO3, O2SAT in the last 168 hours.  Liver Function Tests: No results for input(s): AST, ALT, ALKPHOS, BILITOT, PROT, ALBUMIN in the last 168 hours. No results for input(s): LIPASE, AMYLASE in the last 168 hours. No results for input(s): AMMONIA in the last 168 hours.  CBC: Recent Labs  Lab 02/09/21 0346 02/14/21 0500  WBC 7.3 7.4  HGB 11.9* 13.2  HCT 36.3* 39.5  MCV 96.0 94.3  PLT 304 192    Cardiac Enzymes: No results for input(s): CKTOTAL, CKMB, CKMBINDEX, TROPONINI in the last 168 hours.  BNP (last 3 results) No results for input(s): BNP in the last 8760 hours.  ProBNP (last 3 results) No results for input(s): PROBNP in the last 8760  hours.  Radiological Exams: CT ABDOMEN WO CONTRAST  Result Date: 02/14/2021 CLINICAL DATA:  Evaluate anatomy for percutaneous gastrostomy tube placement. EXAM: CT ABDOMEN WITHOUT CONTRAST TECHNIQUE: Multidetector CT imaging of the abdomen was performed following the standard protocol without IV contrast. COMPARISON:  None. FINDINGS: Lower chest: Dependent densities at both lung bases that could represent atelectasis. No large pleural effusions. Possible 6 mm nodule in the posterior left lower lobe on sequence 5, image 15. Coronary artery calcifications. Hepatobiliary: Numerous hypodensities throughout the liver. These hypodensities are water attenuation based on the Hounsfield units and most compatible with numerous hepatic cysts. Index cyst at the left hepatic dome measures up to 3.7 cm. Cholecystectomy clips. Pancreas: Unremarkable. No pancreatic ductal dilatation or surrounding inflammatory changes. Spleen: Normal in size without focal abnormality. Adrenals/Urinary Tract: Normal appearance of the adrenal glands. Exophytic low-density structure in left kidney lower pole measures up to 3.1 cm and suggestive for a cyst based on the Hounsfield units. No hydronephrosis. Stomach/Bowel: There is a nasogastric feeding tube with the tip near the pylorus. Small amount of high-density contrast in the stomach. Focal collection of oral contrast in the precaval region and this is likely associated with a duodenal diverticulum. Additional contrast in small bowel loops. No significant bowel dilatation. The stomach is cephalad to the transverse colon and no bowel structures positioned between the stomach and the anterior abdominal wall. Small hiatal hernia. Vascular/Lymphatic: Aorta is heavily calcified  and measures up to 2.8 cm. There is short stent within the right common iliac artery. No significant lymph node enlargement in the abdomen. Other: Negative for ascites.  Negative for free air. Musculoskeletal: No acute bone  abnormality. IMPRESSION: 1. The anatomy should be amendable for a percutaneous gastrostomy tube. There is no bowel located between the stomach and anterior abdominal wall. Please note there is evidence for a small hiatal hernia. 2. Focal collection of oral contrast in the precaval region. Suspect this is associated with a duodenal diverticulum. 3. Indeterminate 6 mm nodule at the left lung base. Non-contrast chest CT at 6-12 months is recommended. If the nodule is stable at time of repeat CT, then future CT at 18-24 months (from today's scan) is considered optional for low-risk patients, but is recommended for high-risk patients. This recommendation follows the consensus statement: Guidelines for Management of Incidental Pulmonary Nodules Detected on CT Images: From the Fleischner Society 2017; Radiology 2017; 284:228-243. 4. Hepatic cysts.  Left renal cyst. 5.  Aortic Atherosclerosis (ICD10-I70.0). Electronically Signed   By: Richarda Overlie M.D.   On: 02/14/2021 16:45    Assessment/Plan Active Problems:   Acute on chronic respiratory failure with hypoxia (HCC)   Seizure disorder (HCC)   Acute stroke due to ischemia [facility])   Paroxysmal atrial fibrillation (HCC)   Tracheostomy status (HCC)   1. Acute on chronic respiratory failure hypoxia we will continue with capping trial secretions are slowly improving 2. Seizure disorder no active seizures noted 3. Acute stroke no change 4. Paroxysmal atrial fibrillation rate controlled 5. Tracheostomy remains in place doing well in capping trials   I have personally seen and evaluated the patient, evaluated laboratory and imaging results, formulated the assessment and plan and placed orders. The Patient requires high complexity decision making with multiple systems involvement.  Rounds were done with the Respiratory Therapy Director and Staff therapists and discussed with nursing staff also.  Yevonne Pax, MD [facility] Pulmonary Critical Care Medicine Sleep  Medicine

## 2021-02-15 NOTE — H&P (Addendum)
Chief Complaint: Patient was seen in consultation today for image guided gastrostomy tube placement  at the request of  Dr. Manson Passey, S./ Dr. Sharyon Medicus  Referring Physician(s): Dr. Manson Passey, S./ Dr. Sharyon Medicus  Supervising Physician: Irish Lack  Patient Status: [facility]  History of Present Illness: Adam Mcclain is a 70 y.o. male with PMH of HTN, paroxysmal atrial fibrillation on Eliquis, CVA, seizure, chronic respiratory failure s/p tracheostomy placement who is currently admitted to [facility].  Patient was admitted to [facility] from 01/19/2021 to 02/02/2021, and was started discharged to [facility].  Patient was brought to [facility] ED by EMS due to seizure-like activity and unresponsiveness, patient was intubated. CT head on 01/19/21 negative for acute intracranial abnormality. EEG was performed and showed no seizure activity.  Patient was admitted to [facility],  underwent MRI brain on 01/22/21 which showed chronic right parieto- occipital infarct, left chronic PCA infarct, multiple chronic lacunar infarcts and new small right parietal infarct.  Subsequent CTA showing occlusion of left subclavian, 50% left ICA stenosis, 80 % right CCA stenosis, extensive intracranial disease. Carotid duplex also showing high grade right CCA stenosis of > 80% and left ICA stenosis of 50%.  NIR and vascular surgery were consulted, patient was too unstable for intervention.  Patient failed extubation trial and underwent tracheostomy placement on 5/16. Patient ultimately transfered to  [facility] for continued care on 02/02/21.  Patient underwent speech pathology eval on 02/14/21 and appeared he failed.   IR was requested for a image guided gastrostomy tube placement.   Patient laying in bed, not in acute distress, in wrist restrains.   Patient sleeping and not responsive to verbal stimuli, chest movement noted for breathing. ROS was not performed.     Past Medical History:  Diagnosis Date  . A-fib (HCC)   . Acute on chronic respiratory  failure with hypoxia (HCC)   . Acute stroke due to ischemia (HCC)   . GERD (gastroesophageal reflux disease)   . HTN (hypertension)   . Nephrolithiasis   . PAD (peripheral artery disease) (HCC)   . Paroxysmal atrial fibrillation (HCC)   . Seizure disorder (HCC)   . Stroke (HCC)   . Tracheostomy status [facility])     Past Surgical History:  Procedure Laterality Date  . AORTOGRAM    . CATARACT EXTRACTION, BILATERAL    . CHOLECYSTECTOMY      Allergies: Patient has no known allergies.  Medications: Prior to Admission medications   Medication Sig Start Date End Date Taking? Authorizing Provider  acetaminophen (TYLENOL) 325 MG tablet Place 2 tablets (650 mg total) into feeding tube every 6 (six) hours as needed for fever. 02/02/21   Minor, Vilinda Blanks, NP  amLODipine (NORVASC) 10 MG tablet Place 1 tablet (10 mg total) into feeding tube daily. 02/03/21   Minor, Vilinda Blanks, NP  apixaban (ELIQUIS) 5 MG TABS tablet Place 1 tablet (5 mg total) into feeding tube 2 (two) times daily. 02/02/21   Minor, Vilinda Blanks, NP  aspirin 81 MG chewable tablet Place 1 tablet (81 mg total) into feeding tube daily. 02/03/21   Minor, Vilinda Blanks, NP  atorvastatin (LIPITOR) 40 MG tablet Place 1 tablet (40 mg total) into feeding tube daily. 02/03/21   Minor, Vilinda Blanks, NP  clonazePAM (KLONOPIN) 0.5 MG tablet Place 1 tablet (0.5 mg total) into feeding tube at bedtime. 02/02/21   Minor, Vilinda Blanks, NP  folic acid (FOLVITE) 1 MG tablet Place 1 tablet (1 mg total) into feeding tube daily. 02/03/21  Minor, Vilinda Blanks, NP  ipratropium-albuterol (DUONEB) 0.5-2.5 (3) MG/3ML SOLN Take 3 mLs by nebulization every 6 (six) hours as needed. 02/02/21   Minor, Vilinda Blanks, NP  levETIRAcetam (KEPPRA) 100 MG/ML solution Place 10 mLs (1,000 mg total) into feeding tube 2 (two) times daily. 02/02/21   Minor, Vilinda Blanks, NP  Multiple Vitamin (MULTIVITAMIN WITH MINERALS) TABS tablet Place 1 tablet into feeding tube daily. 02/03/21   Minor, Vilinda Blanks, NP   Nutritional Supplements (FEEDING SUPPLEMENT, OSMOLITE 1.5 CAL,) LIQD Place 1,000 mLs into feeding tube continuous. 02/02/21   Minor, Vilinda Blanks, NP  Nutritional Supplements (FEEDING SUPPLEMENT, PROSOURCE TF,) liquid Place 45 mLs into feeding tube 2 (two) times daily. 02/02/21   Minor, Vilinda Blanks, NP  pantoprazole sodium (PROTONIX) 40 mg/20 mL PACK Place 20 mLs (40 mg total) into feeding tube daily. 02/03/21   Minor, Vilinda Blanks, NP  QUEtiapine (SEROQUEL) 100 MG tablet Place 1 tablet (100 mg total) into feeding tube 2 (two) times daily. 02/02/21   Minor, Vilinda Blanks, NP  thiamine (B-1) 100 MG/ML injection Inject 1 mL (100 mg total) into the vein daily. 02/03/21   Minor, Vilinda Blanks, NP  vitamin B-12 1000 MCG tablet Place 1 tablet (1,000 mcg total) into feeding tube daily. 02/03/21   Minor, Vilinda Blanks, NP     Family History  Problem Relation Age of Onset  . Hypertension Father   . Diabetes Father   . Cancer Father     Social History   Socioeconomic History  . Marital status: Widowed    Spouse name: Not on file  . Number of children: Not on file  . Years of education: Not on file  . Highest education level: Not on file  Occupational History  . Not on file  Tobacco Use  . Smoking status: Current Every Day Smoker  . Smokeless tobacco: Never Used  Substance and Sexual Activity  . Alcohol use: Not Currently  . Drug use: Not on file  . Sexual activity: Not on file  Other Topics Concern  . Not on file  Social History Narrative  . Not on file   Social Determinants of Health   Financial Resource Strain: Not on file  Food Insecurity: Not on file  Transportation Needs: Not on file  Physical Activity: Not on file  Stress: Not on file  Social Connections: Not on file     Review of Systems: pt confused and not responsive. ROS not performed.   Vital Signs: VS from [facility] chart notable for hypotension, SBP in 90s. HR normal rate rhythm.  Afebrile. O2 100%, on vent via trach.   Physical  Exam Vitals reviewed.  Constitutional:      General: He is not in acute distress.    Appearance: He is ill-appearing.  HENT:     Head: Normocephalic and atraumatic.  Cardiovascular:     Rate and Rhythm: Normal rate.     Pulses: Normal pulses.     Heart sounds: Normal heart sounds.  Pulmonary:     Effort: Pulmonary effort is normal.     Breath sounds: Normal breath sounds.  Abdominal:     General: Abdomen is flat. Bowel sounds are normal.     Palpations: Abdomen is soft.  Skin:    General: Skin is warm and dry.     Coloration: Skin is not jaundiced or pale.  Neurological:     Mental Status: He is disoriented.    MD Evaluation Airway: WNL Heart: WNL Abdomen: WNL Chest/  Lungs: WNL ASA  Classification: 3 Mallampati/Airway Score: Two  Imaging: CT ABDOMEN WO CONTRAST  Result Date: 02/14/2021 CLINICAL DATA:  Evaluate anatomy for percutaneous gastrostomy tube placement. EXAM: CT ABDOMEN WITHOUT CONTRAST TECHNIQUE: Multidetector CT imaging of the abdomen was performed following the standard protocol without IV contrast. COMPARISON:  None. FINDINGS: Lower chest: Dependent densities at both lung bases that could represent atelectasis. No large pleural effusions. Possible 6 mm nodule in the posterior left lower lobe on sequence 5, image 15. Coronary artery calcifications. Hepatobiliary: Numerous hypodensities throughout the liver. These hypodensities are water attenuation based on the Hounsfield units and most compatible with numerous hepatic cysts. Index cyst at the left hepatic dome measures up to 3.7 cm. Cholecystectomy clips. Pancreas: Unremarkable. No pancreatic ductal dilatation or surrounding inflammatory changes. Spleen: Normal in size without focal abnormality. Adrenals/Urinary Tract: Normal appearance of the adrenal glands. Exophytic low-density structure in left kidney lower pole measures up to 3.1 cm and suggestive for a cyst based on the Hounsfield units. No hydronephrosis.  Stomach/Bowel: There is a nasogastric feeding tube with the tip near the pylorus. Small amount of high-density contrast in the stomach. Focal collection of oral contrast in the precaval region and this is likely associated with a duodenal diverticulum. Additional contrast in small bowel loops. No significant bowel dilatation. The stomach is cephalad to the transverse colon and no bowel structures positioned between the stomach and the anterior abdominal wall. Small hiatal hernia. Vascular/Lymphatic: Aorta is heavily calcified and measures up to 2.8 cm. There is short stent within the right common iliac artery. No significant lymph node enlargement in the abdomen. Other: Negative for ascites.  Negative for free air. Musculoskeletal: No acute bone abnormality. IMPRESSION: 1. The anatomy should be amendable for a percutaneous gastrostomy tube. There is no bowel located between the stomach and anterior abdominal wall. Please note there is evidence for a small hiatal hernia. 2. Focal collection of oral contrast in the precaval region. Suspect this is associated with a duodenal diverticulum. 3. Indeterminate 6 mm nodule at the left lung base. Non-contrast chest CT at 6-12 months is recommended. If the nodule is stable at time of repeat CT, then future CT at 18-24 months (from today's scan) is considered optional for low-risk patients, but is recommended for high-risk patients. This recommendation follows the consensus statement: Guidelines for Management of Incidental Pulmonary Nodules Detected on CT Images: From the Fleischner Society 2017; Radiology 2017; 284:228-243. 4. Hepatic cysts.  Left renal cyst. 5.  Aortic Atherosclerosis (ICD10-I70.0). Electronically Signed   By: Richarda Overlie M.D.   On: 02/14/2021 16:45   CT ANGIO HEAD NECK W WO CM  Result Date: 01/29/2021 CLINICAL DATA:  Follow-up examination for stroke. EXAM: CT ANGIOGRAPHY HEAD AND NECK TECHNIQUE: Multidetector CT imaging of the head and neck was performed  using the standard protocol during bolus administration of intravenous contrast. Multiplanar CT image reconstructions and MIPs were obtained to evaluate the vascular anatomy. Carotid stenosis measurements (when applicable) are obtained utilizing NASCET criteria, using the distal internal carotid diameter as the denominator. CONTRAST:  50mL OMNIPAQUE IOHEXOL 350 MG/ML SOLN COMPARISON:  Previous MRI from 01/22/2021. FINDINGS: CT HEAD FINDINGS Brain: Age-related cerebral atrophy with chronic small vessel ischemic disease. Chronic right parieto-occipital infarct again noted. Additional chronic left PCA distribution infarct noted as well. Encephalomalacia at the anterior temporal pole on the right likely related to remote trauma. Recently identified small right parietal cortical infarcts not well visualized by CT. No intracranial hemorrhage or mass effect. No  other new large vessel territory infarct. No mass effect or midline shift. No hydrocephalus or extra-axial fluid collection. Vascular: No hyperdense vessel. Calcified atherosclerosis at the skull base. Skull: Scalp soft tissues demonstrate no acute finding. Calvarium intact. Sinuses: Right frontoethmoidal and maxillary sinusitis. Nasogastric tube in place. Right greater than left bilateral mastoid effusions. Orbits: Globes and orbital soft tissues demonstrate no acute finding. Review of the MIP images confirms the above findings CTA NECK FINDINGS Aortic arch: Visualized aortic arch normal in caliber with normal 3 vessel morphology. Moderate atheromatous change about the arch itself. No significant stenosis about the innominate or left common carotid artery. The left subclavian artery occludes just distal to its origin. Distal reconstitution near the level of the left vertebral artery. Narrowed and attenuated flow seen distally within the left subclavian and axillary arteries (series 8, image 278). Additional 50% stenosis noted at the proximal right subclavian artery  (series 8, image 312). Right subclavian artery irregular but otherwise patent. Right carotid system: Extensive atheromatous irregularity throughout the right CCA. Short-segment severe at least 80% stenosis noted involving the mid right CCA (series 8, image 262). Right common and internal carotid arteries irregular but otherwise patent to the skull base without stenosis, dissection or occlusion. Left carotid system: Diffuse atheromatous irregularity seen throughout the left CCA without high-grade stenosis. Concentric predominantly soft plaque about the left carotid bulb and proximal left ICA. Associated stenosis of up to approximately 50% by NASCET criteria. Left ICA patent distally without additional stenosis, dissection or occlusion. Note made of an additional approximate 50% stenosis at the proximal left external carotid artery Vertebral arteries: Both vertebral arteries appear to arise from the subclavian arteries, and are occluded at their origins. On the left, there is distal reconstitution at the proximal V2 segment (series 8, image 254). Markedly narrowed and irregular flow seen distally within the proximal and mid left V2 segment. The left vertebral artery is more normal in caliber and patent at the V3 segment. Left vertebral artery widely patent as it courses into the cranial vault. Flow within the left vertebral artery may be retrograde in nature given the proximal left subclavian occlusion. On the right, there is irregular distal reconstitution at the distal right V2 segment the a muscular branch (series 8, image 195). Right vertebral artery patent distally to the skull base. Skeleton: No visible acute osseous finding. No worrisome osseous lesions. 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 Patient is largely edentulous. Other neck: Endotracheal and enteric tubes in place. No other acute soft tissue abnormality within the neck. No mass or adenopathy. 5 mm left thyroid nodule noted, felt to  be of doubtful significance given size and patient age, no follow-up imaging recommended (ref: J Am Coll Radiol. 2015 Feb;12(2): 143-50). Upper chest: Small layering bilateral pleural effusions partially visualized. Underlying emphysematous changes. Visualized upper chest demonstrates no other acute finding. Review of the MIP images confirms the above findings CTA HEAD FINDINGS Anterior circulation: Extensive atheromatous irregularity throughout the petrous, cavernous, and supraclinoid ICAs with associated moderate multifocal stenosis. Subtle 2-3 mm focal outpouching extending medially from the cavernous left ICA suspicious for a small aneurysm (series 9, image 124). A1 segments patent bilaterally. Normal anterior communicating artery complex. Anterior cerebral arteries patent to their distal aspects without high-grade stenosis. Left M1 segment patent without high-grade stenosis. Short-segment moderate to severe right M1 stenosis (series 7, image 20). Negative MCA bifurcations. No proximal MCA branch occlusion. Distal MCA branches demonstrate diffuse atheromatous irregularity. Posterior circulation: Dominant left  vertebral artery patent to the vertebrobasilar junction. Left PICA origin patent. Hypoplastic right vertebral artery largely terminates in PICA. Moderate proximal basilar artery stenosis (series 8, image 128). Basilar otherwise patent distally. Superior cerebellar arteries patent bilaterally. Both PCAs primarily supplied via the basilar. Right PCA well perfused to its distal aspect. Left PCA attenuated distally, in keeping with the chronic left PCA distribution infarct. Venous sinuses: Grossly patent allowing for timing the contrast bolus. Anatomic variants: Hypoplastic right vertebral artery terminates in PICA. Review of the MIP images confirms the above findings IMPRESSION: CT HEAD IMPRESSION: 1. Stable head CT. Recently identified possible small parietal infarcts not visible by CT. No other new acute  intracranial abnormality. 2. Underlying atrophy with chronic ischemic changes as above. 3. Right-sided paranasal sinus disease. CTA HEAD AND NECK IMPRESSION: 1. 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 Left subclavian artery narrowed and attenuated but patent distally. 2. 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 Visualized flow within the left vertebral artery may be retrograde in nature given the proximal left subclavian artery occlusion. 3. 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 4. 50% atheromatous stenosis at the left carotid bifurcation. 5. 50% proximal right subclavian artery stenosis. 6. Extensive atheromatous change throughout the carotid siphons with associated moderate multifocal stenosis. 7. Short-segment moderate to severe mid right M1 stenosis. 8. 2-3 mm focal outpouching extending medially from the cavernous left ICA, suspicious for a small aneurysm. 9. Small layering bilateral pleural effusions with underlying emphysema. Emphysema (ICD10-J43.9). Electronically Signed   By: Rise Mu M.D.   On: 01/29/2021 04:04   DG Chest 1 View  Result Date: 01/25/2021 CLINICAL DATA:  Intubation EXAM: CHEST  1 VIEW COMPARISON:  Portable exam 1553 hours compared to 01/23/2021 FINDINGS: Tip of endotracheal tube projects 2.9 cm above carina. Normal heart size, mediastinal contours, and pulmonary vascularity. Minimal atelectasis at lung bases. No definite infiltrate, pleural effusion or pneumothorax. IMPRESSION: Minimal bibasilar atelectasis. Electronically Signed   By: Ulyses Southward M.D.   On: 01/25/2021 17:12   DG Abd 1 View  Result Date: 02/10/2021 CLINICAL DATA:  Possible constipation EXAM: ABDOMEN - 1 VIEW COMPARISON:  02/03/2021 FINDINGS: Feeding catheter is again noted in the distal stomach. Scattered large and small bowel  gas is noted. Mild retained fecal material is noted in the right colon although no constipation is noted. IMPRESSION: Feeding catheter in the distal stomach. No other focal abnormality is noted. Electronically Signed   By: Alcide Clever M.D.   On: 02/10/2021 18:48   DG Abd 1 View  Result Date: 01/25/2021 CLINICAL DATA:  Check gastric catheter placement EXAM: ABDOMEN - 1 VIEW COMPARISON:  None. FINDINGS: Gastric catheter is noted with the tip in the stomach. Proximal side port lies in the distal esophagus however. This should be advanced several cm deeper into the stomach. IMPRESSION: Gastric catheter as described which should be advanced further into the stomach. Electronically Signed   By: Alcide Clever M.D.   On: 01/25/2021 18:50   CT Head Wo Contrast  Result Date: 01/19/2021 CLINICAL DATA:  Found down.  Possible seizure. EXAM: CT HEAD WITHOUT CONTRAST TECHNIQUE: Contiguous axial images were obtained from the base of the skull through the vertex without intravenous contrast. COMPARISON:  CT head December 30, 2020. FINDINGS: Brain: Similar encephalomalacia in the anterolateral right temporal lobe, and bilateral occipital lobes. Similar small  remote lacunar infarct versus dilated perivascular space in the left basal ganglia. No evidence of acute large vascular territory infarct. Moderate patchy white matter hypodensities, similar and likely related to chronic microvascular ischemic disease. No acute hemorrhage. Similar ex vacuo ventricular dilation without hydrocephalus. No visible extra-axial fluid collection. No mass lesion or abnormal mass effect. Vascular: No hyperdense vessel.  Calcific atherosclerosis. Skull: No acute fracture. Sinuses/Orbits: Sinuses are largely clear. No acute orbital abnormality. Other: No mastoid effusions. Debris in bilateral external auditory canals, presumably cerumen. Fluid layering in the pharynx, likely related to intubation. IMPRESSION: 1. No evidence of acute intracranial  abnormality. 2. Similar encephalomalacia in the anterolateral right temporal lobe and bilateral occipital lobes. 3. Moderate chronic microvascular ischemic disease. Electronically Signed   By: Feliberto HartsFrederick S Jones MD   On: 01/19/2021 12:53   MR BRAIN WO CONTRAST  Result Date: 01/22/2021 CLINICAL DATA:  Seizure. EXAM: MRI HEAD WITHOUT CONTRAST TECHNIQUE: Multiplanar, multiecho pulse sequences of the brain and surrounding structures were obtained without intravenous contrast. COMPARISON:  Head CT 01/19/2021 and MRI 03/04/2019 FINDINGS: Brain: There is a moderate-sized chronic right parieto-occipital infarct with associated chronic blood products. Scattered subcentimeter foci of trace diffusion weighted signal hyperintensity in this region are largely felt to be secondary to susceptibility artifact from chronic blood products, however there may be a superimposed subcentimeter acute cortical infarct in the right parietal lobe along the anterosuperior aspect of the chronic encephalomalacia (series 3, image 36). No acute infarct is identified elsewhere. There is a large chronic left PCA infarct with chronic blood products. There is also encephalomalacia anteriorly in the right temporal lobe which may reflect an old infarct or be secondary to remote trauma. Patchy T2 hyperintensities in the cerebral white matter bilaterally are nonspecific but compatible with moderate chronic small vessel ischemic disease. Chronic lacunar infarcts are noted in the deep white matter of the right cerebral hemisphere and in the left basal ganglia. There is mild to moderate cerebral atrophy. Scattered chronic microhemorrhages are noted in the cerebrum and cerebellum including in the deep gray nuclei and may reflect the sequelae of chronic hypertension. No mass, midline shift, or extra-axial fluid collection is evident. Vascular: Major intracranial vascular flow voids are preserved. Skull and upper cervical spine: Unremarkable bone marrow  signal. Congenital C2-3 fusion. Sinuses/Orbits: Bilateral cataract extraction. Clear paranasal sinuses. Small to moderate bilateral mastoid effusions. Other: Fluid in the pharynx in the setting of intubation. IMPRESSION: 1. Possible subcentimeter acute right parietal cortical infarct along the margin of a chronic parieto-occipital infarct. 2. Large chronic left PCA infarct. 3. Encephalomalacia in the anterior right temporal lobe. 4. Moderate chronic small vessel ischemic disease with multiple chronic lacunar infarcts. Electronically Signed   By: Sebastian AcheAllen  Grady M.D.   On: 01/22/2021 16:37   DG Chest Port 1 View  Result Date: 02/03/2021 CLINICAL DATA:  Shortness of breath EXAM: PORTABLE CHEST 1 VIEW COMPARISON:  01/29/2021 FINDINGS: Tracheostomy and feeding tube remain in place, unchanged. No confluent airspace opacities or effusions. Heart is normal size. No visible effusions. No acute bony abnormality. IMPRESSION: No active disease. Electronically Signed   By: Charlett NoseKevin  Dover M.D.   On: 02/03/2021 15:43   DG CHEST PORT 1 VIEW  Result Date: 01/29/2021 CLINICAL DATA:  Acute respiratory failure with hypercapnia. EXAM: PORTABLE CHEST 1 VIEW COMPARISON:  Jan 27, 2021. FINDINGS: The heart size and mediastinal contours are within normal limits. Tracheostomy and feeding tubes are in good position. No pneumothorax is noted. Mild bibasilar subsegmental atelectasis is noted. Minimal  right pleural effusion is noted. The visualized skeletal structures are unremarkable. IMPRESSION: Mild bibasilar subsegmental atelectasis. Minimal right pleural effusion. Tracheostomy and feeding tubes in good position Electronically Signed   By: Lupita Raider M.D.   On: 01/29/2021 16:31   DG CHEST PORT 1 VIEW  Result Date: 01/27/2021 CLINICAL DATA:  Encounter for intubation of airway. EXAM: PORTABLE CHEST 1 VIEW COMPARISON:  01/25/2021 FINDINGS: ET tube tip is above the carina. There is a feeding tube with tip well below the GE junction  in the projection of the right abdomen. Mild bibasilar opacities. No pleural effusions or interstitial edema. IMPRESSION: 1. ET tube tip above the carina. 2. Mild bibasilar opacities compatible with atelectasis versus pneumonia. Electronically Signed   By: Signa Kell M.D.   On: 01/27/2021 05:57   DG CHEST PORT 1 VIEW  Result Date: 01/23/2021 CLINICAL DATA:  Hypoxia EXAM: PORTABLE CHEST 1 VIEW COMPARISON:  Jan 21, 2021 FINDINGS: Endotracheal tube tip is at the carina directed toward the right main bronchus. Nasogastric tube tip and side port in stomach. No edema or airspace opacity. Apparent nipple shadow on the left, stable. Heart size and pulmonary vascularity are normal. No adenopathy. No bone lesions. IMPRESSION: Endotracheal tube tip at carina directed toward the right main bronchus. Advise withdrawing endotracheal tube approximately 3 cm. No edema or airspace opacity. These results will be called to the ordering clinician or representative by the Radiologist Assistant, and communication documented in the PACS or [employer]. Electronically Signed   By: Bretta Bang III M.D.   On: 01/23/2021 09:53   DG CHEST PORT 1 VIEW  Result Date: 01/21/2021 CLINICAL DATA:  Unresponsive, respirator dependent EXAM: PORTABLE CHEST 1 VIEW COMPARISON:  01/19/2021 chest radiograph. FINDINGS: Endotracheal tube tip is 3.0 cm above the carina. Enteric tube enters stomach with the tip not seen on this image. Stable cardiomediastinal silhouette with normal heart size. No pneumothorax. No pleural effusion. Mild medial bibasilar atelectasis, new. No pulmonary edema. IMPRESSION: 1. Well-positioned endotracheal and enteric tubes. 2. New mild medial bibasilar atelectasis. Electronically Signed   By: Delbert Phenix M.D.   On: 01/21/2021 08:39   DG Chest Portable 1 View  Result Date: 01/19/2021 CLINICAL DATA:  Unresponsive. EXAM: PORTABLE CHEST 1 VIEW COMPARISON:  Chest x-ray dated December 30, 2020. FINDINGS: Endotracheal  tube in place with the tip 4.7 cm above the carina. Enteric tube in the stomach. The heart size and mediastinal contours are within normal limits. Normal pulmonary vascularity. No focal consolidation, pleural effusion, or pneumothorax. No acute osseous abnormality. Prior cholecystectomy. IMPRESSION: 1. Appropriately positioned endotracheal and enteric tubes. 2. No active disease. Electronically Signed   By: Obie Dredge M.D.   On: 01/19/2021 12:30   DG Abd Portable 1V  Result Date: 02/03/2021 CLINICAL DATA:  70 year old male enteric tube placement. EXAM: PORTABLE ABDOMEN - 1 VIEW COMPARISON:  01/25/2021. FINDINGS: Portable AP supine view at 0616 hours. NG type tube has been removed and enteric feeding tube is in place with tip at the level of the gastric antrum just to the right of midline. Mild gaseous distension of the stomach now. Other bowel gas pattern stable and within normal limits. Stable cholecystectomy clips. Stable lung bases. No acute osseous abnormality identified. IMPRESSION: Enteric feeding tube tip at the distal stomach. Advance 6 cm if post pyloric transit is desired. Electronically Signed   By: Odessa Fleming M.D.   On: 02/03/2021 07:06   DG Abd Portable 1V  Result Date: 01/20/2021 CLINICAL DATA:  Enteric tube placement. EXAM: PORTABLE ABDOMEN - 1 VIEW COMPARISON:  Chest x-ray 01/19/2021 FINDINGS: Evidence of patient's enteric tube in adequate position with tip just left of midline over the stomach in the upper abdomen. Surgical clips over the gallbladder fossa. Nonobstructive bowel gas pattern. Remainder the exam is unchanged. IMPRESSION: Enteric tube with tip just left of midline over the stomach in the upper abdomen. Electronically Signed   By: Elberta Fortis M.D.   On: 01/20/2021 10:49   EEG adult  Result Date: 01/19/2021 Charlsie Quest, MD     01/19/2021  1:30 PM Patient Name: AMOS MICHEALS MRN: [id number] Epilepsy Attending: Charlsie Quest Referring Physician/Provider: Dr Caryl Pina  Date: 01/19/2021 Duration: 27.02 mins Patient history: 70 year old male presenting with GTC seizures. EEG to evaluate for seizure Level of alertness:  lethargic AEDs during EEG study: LEV, Versed Technical aspects: This EEG study was done with scalp electrodes positioned according to the 10-20 International system of electrode placement. Electrical activity was acquired at a sampling rate of 500Hz  and reviewed with a high frequency filter of 70Hz  and a low frequency filter of 1Hz . EEG data were recorded continuously and digitally stored. Description: EEG showed continuous generalized polymorphic sharply contoured 3 to 6 Hz theta-delta slowing. There is an excessive amount of 15 to 18 Hz beta activity with irregular morphology distributed symmetrically and diffusely. Hyperventilation and photic stimulation were not performed.   ABNORMALITY - Continuous slow, generalized - Excessive beta, generalized IMPRESSION: This study is suggestive of moderate diffuse encephalopathy, nonspecific etiology. The excessive beta activity seen in the background is most likely due to the effect of benzodiazepine and is a benign EEG pattern. No seizures or epileptiform discharges were seen throughout the recording. Priyanka Annabelle Harman   Overnight EEG with video  Result Date: 01/21/2021 Charlsie Quest, MD     01/21/2021  9:34 AM Patient Name: JASDEEP KEPNER MRN: [id number] Epilepsy Attending: Charlsie Quest Referring Physician/Provider: Dr Caryl Pina Duration: 01/20/2021 0809 to 01/21/2021 0809  Patient history: 70 year old male presenting with GTC seizures. EEG to evaluate for seizure  Level of alertness:  comatose/sedated  AEDs during EEG study: LEV, Versed, propofol  Technical aspects: This EEG study was done with scalp electrodes positioned according to the 10-20 International system of electrode placement. Electrical activity was acquired at a sampling rate of 500Hz  and reviewed with a high frequency filter of 70Hz  and a low  frequency filter of 1Hz . EEG data were recorded continuously and digitally stored.  Description: EEG initially showed burst suppression pattern with bursts of low amplitude 15-18hz  beta activity admixed with 5-7hz  theta slowing lasting 1-2 seconds alternating with generalized suppression lasting 2-3 seconds.Gradually, as propofol was weaned off around 1730 on 01/20/2021, eeg showed continuous generalized polymorphic sharply contoured 3 to 6 Hz theta-delta slowing at times with triphasic morphology. Hyperventilation and photic stimulation were not performed.    ABNORMALITY - Burst suppression, generalized - Continuous slow, generalized  IMPRESSION: This study was initially suggestive of profound diffuse encephalopathy likely due to sedation. As propofol was weaned, eeg was suggestive of moderate to severe diffuse encephalopathy, nonspecific etiology. No seizures or epileptiform discharges were seen throughout the recording.  Charlsie Quest   ECHOCARDIOGRAM COMPLETE  Result Date: 01/24/2021    ECHOCARDIOGRAM REPORT   Patient Name:   LASEAN GORNIAK Date of Exam: 01/24/2021 Medical Rec #:  [id number]     Height: Accession #:    [id number]    Weight:  133.6 lb Date of Birth:  09/13/1951     BSA:          1.543 m Patient Age:    69 years      BP:           101/69 mmHg Patient Gender: M             HR:           60 bpm. Exam Location:  Inpatient Procedure: 2D Echo Indications:    stroke  History:        Patient has no prior history of Echocardiogram examinations.                 COPD and chronic kidney disease, Arrythmias:Atrial Fibrillation;                 Risk Factors:Hypertension.  Sonographer:    Delcie Roch Referring Phys: [id number] [facility]  Sonographer Comments: Echo performed with patient supine and on artificial respirator and suboptimal parasternal window. Image acquisition challenging due to uncooperative patient. IMPRESSIONS  1. Left ventricular ejection fraction, by estimation, is 60  to 65%. The left ventricle has normal function. The left ventricle has no regional wall motion abnormalities. Left ventricular diastolic parameters were normal.  2. Right ventricular systolic function is normal. The right ventricular size is normal.  3. The mitral valve is normal in structure. No evidence of mitral valve regurgitation. No evidence of mitral stenosis.  4. The aortic valve has an indeterminant number of cusps. Aortic valve regurgitation is not visualized. No aortic stenosis is present.  5. The inferior vena cava is normal in size with greater than 50% respiratory variability, suggesting right atrial pressure of 3 mmHg. FINDINGS  Left Ventricle: Left ventricular ejection fraction, by estimation, is 60 to 65%. The left ventricle has normal function. The left ventricle has no regional wall motion abnormalities. The left ventricular internal cavity size was normal in size. There is  no left ventricular hypertrophy. Left ventricular diastolic parameters were normal. Right Ventricle: The right ventricular size is normal.Right ventricular systolic function is normal. Left Atrium: Left atrial size was normal in size. Right Atrium: Right atrial size was normal in size. Pericardium: There is no evidence of pericardial effusion. Mitral Valve: The mitral valve is normal in structure. No evidence of mitral valve regurgitation. No evidence of mitral valve stenosis. Tricuspid Valve: The tricuspid valve is normal in structure. Tricuspid valve regurgitation is trivial. No evidence of tricuspid stenosis. Aortic Valve: The aortic valve has an indeterminant number of cusps. Aortic valve regurgitation is not visualized. No aortic stenosis is present. Pulmonic Valve: The pulmonic valve was not well visualized. Pulmonic valve regurgitation is not visualized. No evidence of pulmonic stenosis. Aorta: The aortic root is normal in size and structure. Venous: The inferior vena cava is normal in size with greater than 50%  respiratory variability, suggesting right atrial pressure of 3 mmHg. IAS/Shunts: No atrial level shunt detected by color flow Doppler.  LEFT VENTRICLE PLAX 2D LVIDd:         4.00 cm  Diastology LVIDs:         2.50 cm  LV e' medial:    7.40 cm/s LV PW:         0.90 cm  LV E/e' medial:  10.8 LV IVS:        0.80 cm  LV e' lateral:   7.94 cm/s LVOT diam:     1.80 cm  LV E/e' lateral: 10.0 LV  SV:         61 LV SV Index:   39 LVOT Area:     2.54 cm  RIGHT VENTRICLE             IVC RV S prime:     16.00 cm/s  IVC diam: 2.00 cm TAPSE (M-mode): 2.5 cm LEFT ATRIUM             Index       RIGHT ATRIUM           Index LA diam:        3.00 cm 1.94 cm/m  RA Area:     14.70 cm LA Vol (A2C):   44.3 ml 28.71 ml/m RA Volume:   37.40 ml  24.24 ml/m LA Vol (A4C):   42.4 ml 27.48 ml/m LA Biplane Vol: 44.0 ml 28.52 ml/m  AORTIC VALVE LVOT Vmax:   116.00 cm/s LVOT Vmean:  71.200 cm/s LVOT VTI:    0.238 m  AORTA Ao Root diam: 2.90 cm MITRAL VALVE MV Area (PHT): 3.37 cm    SHUNTS MV Decel Time: 225 msec    Systemic VTI:  0.24 m MV E velocity: 79.70 cm/s  Systemic Diam: 1.80 cm MV A velocity: 63.80 cm/s MV E/A ratio:  1.25 Olga Millers MD Electronically signed by Olga Millers MD Signature Date/Time: 01/24/2021/4:20:42 PM    Final    VAS US CAROTID  Result Date: 01/23/2021 Carotid Arterial Duplex Study Patient Name:  JERMARI TAMARGO  Date of Exam:   01/23/2021 Medical Rec #: [id number]      Accession #:    [id number] Date of Birth: [birth year]      Patient Gender: M Patient Age:   69Y Exam Location:  [facility] Procedure:      VAS US CAROTID Referring Phys: [id number] [facility] --------------------------------------------------------------------------------  Indications:       CVA. Risk Factors:      Hypertension, hyperlipidemia, current smoker, prior CVA,                    PAD. Other Factors:     Afib, CKD, COPD. Comparison Study:  No previous exams Performing Technologist: Ernestene Mention  Examination Guidelines: A  complete evaluation includes B-mode imaging, spectral Doppler, color Doppler, and power Doppler as needed of all accessible portions of each vessel. Bilateral testing is considered an integral part of a complete examination. Limited examinations for reoccurring indications may be performed as noted.  Right Carotid Findings: +----------+--------+--------+--------+------------------+------------------+           PSV cm/sEDV cm/sStenosisPlaque DescriptionComments           +----------+--------+--------+--------+------------------+------------------+ CCA Prox  58      20                                intimal thickening +----------+--------+--------+--------+------------------+------------------+ CCA Distal627     144     >50%    heterogenous      intimal thickening +----------+--------+--------+--------+------------------+------------------+ ICA Prox  77      16      1-39%   heterogenous      intimal thickening +----------+--------+--------+--------+------------------+------------------+ ICA Distal51      20                                                   +----------+--------+--------+--------+------------------+------------------+  ECA       318     0       >50%    heterogenous                         +----------+--------+--------+--------+------------------+------------------+ +----------+--------+-------+----------------+-------------------+           PSV cm/sEDV cmsDescribe        Arm Pressure (mmHG) +----------+--------+-------+----------------+-------------------+ [id number]            Multiphasic, WNL                    +----------+--------+-------+----------------+-------------------+ +---------+--------+--------+-----------------+ VertebralPSV cm/sEDV cm/sSeverely dampened +---------+--------+--------+-----------------+  Left Carotid Findings: +----------+-------+--------+--------+-----------------------+-----------------+           PSV    EDV  cm/sStenosisPlaque Description     Comments                    cm/s                                                            +----------+-------+--------+--------+-----------------------+-----------------+ CCA Prox  97     25                                     intimal                                                                   thickening        +----------+-------+--------+--------+-----------------------+-----------------+ CCA Distal98     24              heterogenous           intimal                                                                   thickening        +----------+-------+--------+--------+-----------------------+-----------------+ ICA Prox  269    69      60-79%  heterogenous and                                                          calcific                                 +----------+-------+--------+--------+-----------------------+-----------------+ ICA Distal125    27                                                       +----------+-------+--------+--------+-----------------------+-----------------+  ECA       564    39      >50%    heterogenous                             +----------+-------+--------+--------+-----------------------+-----------------+ +----------+-------+--------+-------------------------------+------------------+           PSV    EDV cm/sDescribe                       Arm Pressure                 cm/s                                          (mmHG)             +----------+-------+--------+-------------------------------+------------------+ Subclavian71     13      Low resistive, post stenotic                                               flow                                              +----------+-------+--------+-------------------------------+------------------+ +---------+--------+--+--------+--+---------+ VertebralPSV cm/s42EDV cm/s13Antegrade  +---------+--------+--+--------+--+---------+   Summary: Right Carotid: Velocities in the right ICA are consistent with a 1-39% stenosis.                Hemodynamically significant plaque 80-99% visualized in the CCA.                The ECA appears >50% stenosed. Left Carotid: Velocities in the left ICA are consistent with a 60-79% stenosis.               Non-hemodynamically significant plaque <50% noted in the CCA. The               ECA appears >50% stenosed. Vertebrals:  Left vertebral artery demonstrates antegrade flow. Right vertebral              artery shows severely dampened waveforms. Subclavians: Normal flow hemodynamics were seen in the right subclavian artery.              Left subclavian artery shows low resistive, post stenotic              waveforms. *See table(s) above for measurements and observations.  Vascular consult recommended. Electronically signed by Gretta Began MD on 01/23/2021 at 9:43:00 PM.    Final    VAS [place] TRANSCRANIAL DOPPLER  Result Date: 01/25/2021  Transcranial Doppler Patient Name:  NAHMIR ZEIDMAN  Date of Exam:   01/25/2021 Medical Rec #: [id number]      Accession #:    [id number] Date of Birth: Oct 08, 1950      Patient Gender: M Patient Age:   69Y Exam Location:  [facility] Procedure:      VAS [place] TRANSCRANIAL DOPPLER Referring Phys: [id number] [facility] --------------------------------------------------------------------------------  Indications: Stroke. Comparison Study: no prior Performing Technologist: Blanch Media RVS  Examination Guidelines: A complete evaluation includes B-mode imaging, spectral Doppler, color Doppler, and power  Doppler as needed of all accessible portions of each vessel. Bilateral testing is considered an integral part of a complete examination. Limited examinations for reoccurring indications may be performed as noted.  +----------+-------------+----------+-----------+-------+ RIGHT TCD Right VM (cm)Depth (cm)PulsatilityComment  +----------+-------------+----------+-----------+-------+ MCA           58.00                 1.10            +----------+-------------+----------+-----------+-------+ ACA          -32.00                 0.88            +----------+-------------+----------+-----------+-------+ Term ICA      43.00                 1.01            +----------+-------------+----------+-----------+-------+ PCA           35.00                 0.60            +----------+-------------+----------+-----------+-------+ Opthalmic     11.00                 2.08            +----------+-------------+----------+-----------+-------+ ICA siphon    46.00                 1.27            +----------+-------------+----------+-----------+-------+ Vertebral    -28.00                 0.99            +----------+-------------+----------+-----------+-------+  +----------+------------+----------+-----------+------------------+ LEFT TCD  Left VM (cm)Depth (cm)Pulsatility     Comment       +----------+------------+----------+-----------+------------------+ MCA          20.00                 0.79                       +----------+------------+----------+-----------+------------------+ ACA          -23.00                0.61                       +----------+------------+----------+-----------+------------------+ Term ICA                                   unable to insonate +----------+------------+----------+-----------+------------------+ PCA                                        unable to insonate +----------+------------+----------+-----------+------------------+ Opthalmic    17.00                 1.17                       +----------+------------+----------+-----------+------------------+ ICA siphon   53.00                 1.18                       +----------+------------+----------+-----------+------------------+  Vertebral    -24.00                1.18                        +----------+------------+----------+-----------+------------------+  +------------+-------+-------+             VM cm/sComment +------------+-------+-------+ Dist Basilar-20.00         +------------+-------+-------+ Summary:  Poor left temporal window limits evaluation of anetrior circulation on left otherwise normal mean flow velocities in majority of identified vessels of anterior and posterior cerebral circulation. *See table(s) above for TCD measurements and observations.  Diagnosing physician: Delia Heady MD Electronically signed by Delia Heady MD on 01/25/2021 at 11:58:40 AM.    Final    US Abdomen Limited RUQ (LIVER/GB)  Result Date: 01/27/2021 CLINICAL DATA:  Hyperbilirubinemia. EXAM: ULTRASOUND ABDOMEN LIMITED RIGHT UPPER QUADRANT COMPARISON:  None. FINDINGS: Gallbladder: Gallbladder is surgically absent. Common bile duct: Diameter: 6.7 millimeter Liver: The liver parenchyma is normal in echogenicity. Numerous hepatic cysts are identified. In the LEFT hepatic lobe, cyst is 3.0 x 3.0 x 4.0 centimeters. A second cyst is 2 x 2 x 2 centimeters. In the RIGHT hepatic lobe, cyst is 4 x 3 x 3 centimeters. No solid or suspicious liver masses. Portal vein is patent on color Doppler imaging with normal direction of blood flow towards the liver. Other: None. IMPRESSION: 1. Multiple hepatic cysts. 2. Cholecystectomy. 3. No biliary duct dilatation identified. Electronically Signed   By: Norva Pavlov M.D.   On: 01/27/2021 13:45    Labs:  CBC: Recent Labs    02/02/21 0846 02/03/21 0342 02/09/21 0346 02/14/21 0500  WBC 12.6* 12.7* 7.3 7.4  HGB 11.5* 12.7* 11.9* 13.2  HCT 35.0* 38.3* 36.3* 39.5  PLT 315 386 304 192    COAGS: Recent Labs    02/03/21 0342  INR 1.4*    BMP: Recent Labs    02/03/21 0342 02/04/21 0346 02/09/21 0346 02/14/21 0500  NA 138 138 138 137  K 4.0 3.9 4.2 4.0  CL 101 100 101 97*  CO2 25 28 32 28  GLUCOSE 118* 120* 133* 110*  BUN 26* 35* 28*  26*  CALCIUM 8.7* 8.5* 8.9 9.1  CREATININE 1.43* 1.36* 1.28* 1.21  GFRNONAA 52* 56* 60* >60    LIVER FUNCTION TESTS: Recent Labs    01/27/21 0607 01/29/21 0116 02/02/21 0846 02/03/21 0342  BILITOT 1.9* 0.8 0.4 0.9  AST 65* 38 48* 71*  ALT 93* 66* 81* 118*  ALKPHOS 93 91 87 94  PROT 6.1* 5.5* 6.3* 7.1  ALBUMIN 2.2* 2.1* 2.4* 2.6*    TUMOR MARKERS: No results for input(s): AFPTM, CEA, CA199, CHROMGRNA in the last 8760 hours.  Assessment and Plan: 70 y.o. male  with PMH of HTN, paroxysmal atrial fibrillation on Eliquis, CVA, seizure, chronic respiratory failure s/p tracheostomy placement who is currently admitted to [facility].   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 NIR and vascular surgery were consulted but patient was medically too unstable for intervention. Patient failed extubation multiple times and underwent tracheostomy placement. Patient was ultimately transferred to [facility] and underwent speech evaluation which he failed.   IR was requested for image guided gastrostomy tube placement.  CT abd was obtained to evaluate anatomy. CT was reveiewed and G tube placement was approved by Dr. Lowella Dandy.  There was a incidental finding of a  LLL pulmonary nodule, Dr. Sharyon Medicus was notified.   Patient is on ASA and Eliquis, the procedure is tentatively scheduled for Monday 02/19/21.  ASA to be d/c on Friday 6/3, Eliquis to be d/c on Saturday 6/4. Tube feeding to be stopped Monday midnight.  [facility] RN notified.  Ancef ordered. Pt hypotensive with SBP in 90s at the time of assessment.  HR 76.   Pt confused and not able to make own medical decisions at this time, procedure was discussed with brother Mr. Domonique Brouillard and he is agreeable to proceed.   Risks and benefits image guided gastrostomy tube placement was discussed with the patient's brother including, but not limited to the need for a barium enema during the  procedure, bleeding, infection, peritonitis and/or damage to adjacent structures.  All of the brother's questions were answered, patient is agreeable to proceed.  Consent signed and in IR binder.   Thank you for this interesting consult.  I greatly enjoyed meeting KHAIDYN STAEBELL and look forward to participating in their care.  A copy of this report was sent to the requesting provider on this date.  Electronically Signed: Willette Brace, PA-C 02/15/2021, 1:49 PM   I spent a total of  30 minutes in face to face in clinical consultation, greater than 50% of which was counseling/coordinating care for g tube placement.

## 2021-02-18 NOTE — Progress Notes (Signed)
Pulmonary Critical Care Medicine [facility] [place]   PULMONARY CRITICAL CARE SERVICE  PROGRESS NOTE     Adam Mcclain  URK:[id number]  DOB: 04-03-1951   DOA: 02/02/2021  Referring Physician: Carron Curie, MD  HPI: Adam Mcclain is a 70 y.o. male seen for follow up of Acute on Chronic Respiratory Failure.  Patient remains capping apparently is supposed to have PEG placed this week  Medications: Reviewed on Rounds  Physical Exam:  Vitals: Temperature is 96.5 pulse 81 respiratory 18 blood pressure is 143/83 saturations 100%  Ventilator Settings capping on room air  . General: Comfortable at this time . ENT: grossly unremarkable . Neck: no obvious mass . Cardiovascular: no malignant arrhythmias . Respiratory: No rhonchi very coarse breath sound . Abdomen: soft . Skin: no rash seen on limited exam . Musculoskeletal: not rigid . Psychiatric:unable to assess . Neurologic: no seizure no involuntary movements         Lab Data:   Basic Metabolic Panel: Recent Labs  Lab 02/14/21 0500  NA 137  K 4.0  CL 97*  CO2 28  GLUCOSE 110*  BUN 26*  CREATININE 1.21  CALCIUM 9.1    ABG: No results for input(s): PHART, PCO2ART, PO2ART, HCO3, O2SAT in the last 168 hours.  Liver Function Tests: No results for input(s): AST, ALT, ALKPHOS, BILITOT, PROT, ALBUMIN in the last 168 hours. No results for input(s): LIPASE, AMYLASE in the last 168 hours. No results for input(s): AMMONIA in the last 168 hours.  CBC: Recent Labs  Lab 02/14/21 0500  WBC 7.4  HGB 13.2  HCT 39.5  MCV 94.3  PLT 192    Cardiac Enzymes: No results for input(s): CKTOTAL, CKMB, CKMBINDEX, TROPONINI in the last 168 hours.  BNP (last 3 results) No results for input(s): BNP in the last 8760 hours.  ProBNP (last 3 results) No results for input(s): PROBNP in the last 8760 hours.  Radiological Exams: No results found.  Assessment/Plan Active Problems:   Acute on chronic respiratory  failure with hypoxia (HCC)   Seizure disorder (HCC)   Acute stroke due to ischemia [facility])   Paroxysmal atrial fibrillation (HCC)   Tracheostomy status (HCC)   1. Acute on chronic respiratory failure with hypoxia patient right now is capping doing actually quite well holding off on decannulation because of the PEG tube that is coming up 2. Seizure disorder no sign of active seizures 3. Acute stroke patient is at baseline 4. Paroxysmal atrial fibrillation rate is controlled 5. Tracheostomy doing fine with capping   I have personally seen and evaluated the patient, evaluated laboratory and imaging results, formulated the assessment and plan and placed orders. The Patient requires high complexity decision making with multiple systems involvement.  Rounds were done with the Respiratory Therapy Director and Staff therapists and discussed with nursing staff also.  Yevonne Pax, MD [facility] Pulmonary Critical Care Medicine Sleep Medicine

## 2021-02-19 NOTE — Progress Notes (Addendum)
Interventional Radioloy Brief Note:  IR PA followed up with [facility] provider, Dr. Sharyon Medicus re: gastrostomy tube placement.  He confirms there was confusion over the weekend about the goals of the procedure, however after discussion with the family today all confusion has been resolved and they are interested in pursuing placement.  He states patient resumed TF via NGT, but did not receive Eliquis or aspirin.  Will plan for gastostomy tube placement as early as today pending IR schedule.   NPO order placed by Dr. Sharyon Medicus.   Called and spoke with brother, Jaythen Hamme who confirms desire for gastrostomy tube placement in IR.  All questions answered.  Loyce Dys, MS RD PA-C

## 2021-02-19 NOTE — Progress Notes (Signed)
Pulmonary Critical Care Medicine [facility] [place]   PULMONARY CRITICAL CARE SERVICE  PROGRESS NOTE     CHEIKH BRAMBLE  JKK:[id number]  DOB: [birth year]   DOA: 02/02/2021  Referring Physician: Carron Curie, MD  HPI: DMAURI ROSENOW is a 70 y.o. male seen for follow up of Acute on Chronic Respiratory Failure.  Patient at this time is capping on room air supposed to be having a PEG which is why we will hold off on decannulation  Medications: Reviewed on Rounds  Physical Exam:  Vitals: Temperature is 97.6 pulse 91 respiratory 21 blood pressure is 139/77 saturations 100%  Ventilator Settings currently capping on room air  . General: Comfortable at this time . ENT: grossly unremarkable . Neck: no obvious mass . Cardiovascular: no malignant arrhythmias . Respiratory: No rhonchi very coarse breath sounds . Abdomen: soft . Skin: no rash seen on limited exam . Musculoskeletal: not rigid . Psychiatric:unable to assess . Neurologic: no seizure no involuntary movements         Lab Data:   Basic Metabolic Panel: Recent Labs  Lab 02/14/21 0500  NA 137  K 4.0  CL 97*  CO2 28  GLUCOSE 110*  BUN 26*  CREATININE 1.21  CALCIUM 9.1    ABG: No results for input(s): PHART, PCO2ART, PO2ART, HCO3, O2SAT in the last 168 hours.  Liver Function Tests: No results for input(s): AST, ALT, ALKPHOS, BILITOT, PROT, ALBUMIN in the last 168 hours. No results for input(s): LIPASE, AMYLASE in the last 168 hours. No results for input(s): AMMONIA in the last 168 hours.  CBC: Recent Labs  Lab 02/14/21 0500  WBC 7.4  HGB 13.2  HCT 39.5  MCV 94.3  PLT 192    Cardiac Enzymes: No results for input(s): CKTOTAL, CKMB, CKMBINDEX, TROPONINI in the last 168 hours.  BNP (last 3 results) No results for input(s): BNP in the last 8760 hours.  ProBNP (last 3 results) No results for input(s): PROBNP in the last 8760 hours.  Radiological Exams: No results  found.  Assessment/Plan Active Problems:   Acute on chronic respiratory failure with hypoxia (HCC)   Seizure disorder (HCC)   Acute stroke due to ischemia [facility])   Paroxysmal atrial fibrillation (HCC)   Tracheostomy status (HCC)   1. Acute on chronic respiratory failure hypoxia we will continue with capping trials patient is going to be decannulated once the PEG is placed 2. Seizure disorder no sign of active seizures 3. Paroxysmal atrial fibrillation rate controlled 4. Tracheostomy remains in place 5. Acute stroke supportive care   I have personally seen and evaluated the patient, evaluated laboratory and imaging results, formulated the assessment and plan and placed orders. The Patient requires high complexity decision making with multiple systems involvement.  Rounds were done with the Respiratory Therapy Director and Staff therapists and discussed with nursing staff also.  Yevonne Pax, MD [facility] Pulmonary Critical Care Medicine Sleep Medicine

## 2021-02-19 NOTE — Progress Notes (Signed)
Called floor RN to send for pt to come to radiology for procedure. RN states that there was some discussion over the weekend with pt family regarding canceling the procedure but they have now decided to move forward.  RN states that pt has not been NPO and that he has also had his eloquis. I advised that I will have the radiology PA follow up regarding new orders. Unable to do procedure today d/t pt not NPO and eloquis given

## 2021-02-20 ENCOUNTER — Other Ambulatory Visit (HOSPITAL_COMMUNITY): Payer: Medicare Other

## 2021-02-20 HISTORY — PX: IR GASTROSTOMY TUBE MOD SED: IMG625

## 2021-02-20 MED ORDER — GLUCAGON HCL RDNA (DIAGNOSTIC) 1 MG IJ SOLR
INTRAMUSCULAR | Status: AC
  Filled 2021-02-20: qty 1

## 2021-02-20 MED ORDER — LIDOCAINE HCL (PF) 1 % IJ SOLN
INTRAMUSCULAR | Status: AC
  Filled 2021-02-20: qty 30

## 2021-02-20 MED ORDER — IOHEXOL 300 MG/ML  SOLN
50.0000 mL | Freq: Once | INTRAMUSCULAR | Status: AC | PRN
  Administered 2021-02-20: 10 mL

## 2021-02-20 MED ORDER — LIDOCAINE HCL 1 % IJ SOLN
INTRAMUSCULAR | Status: AC | PRN
  Administered 2021-02-20: 10 mL

## 2021-02-20 MED ORDER — FENTANYL CITRATE (PF) 100 MCG/2ML IJ SOLN
INTRAMUSCULAR | Status: AC | PRN
  Administered 2021-02-20 (×2): 25 ug via INTRAVENOUS

## 2021-02-20 MED ORDER — GLUCAGON HCL (RDNA) 1 MG IJ SOLR
INTRAMUSCULAR | Status: AC | PRN
  Administered 2021-02-20: .5 mg via INTRAVENOUS

## 2021-02-20 MED ORDER — CEFAZOLIN SODIUM-DEXTROSE 2-4 GM/100ML-% IV SOLN
2.0000 g | Freq: Once | INTRAVENOUS | Status: AC
  Administered 2021-02-20: 2 g via INTRAVENOUS

## 2021-02-20 MED ORDER — CEFAZOLIN SODIUM-DEXTROSE 2-4 GM/100ML-% IV SOLN
INTRAVENOUS | Status: AC
  Filled 2021-02-20: qty 100

## 2021-02-20 MED ORDER — FENTANYL CITRATE (PF) 100 MCG/2ML IJ SOLN
INTRAMUSCULAR | Status: AC
  Filled 2021-02-20: qty 4

## 2021-02-20 MED ORDER — MIDAZOLAM HCL 2 MG/2ML IJ SOLN
INTRAMUSCULAR | Status: AC
  Filled 2021-02-20: qty 4

## 2021-02-20 MED ORDER — MIDAZOLAM HCL 2 MG/2ML IJ SOLN
INTRAMUSCULAR | Status: AC | PRN
  Administered 2021-02-20 (×2): 0.5 mg via INTRAVENOUS

## 2021-02-20 MED ORDER — CEFAZOLIN (ANCEF) 1 G IV SOLR: 2.0000 g | INTRAVENOUS | Status: DC

## 2021-02-20 NOTE — Sedation Documentation (Signed)
Nurse from [facility] came to IR 1 and transported patient to [facility] with transporter.

## 2021-02-20 NOTE — Sedation Documentation (Signed)
Charge nurse called respiratory placed trach collar and suction patient tolerated well.

## 2021-02-21 NOTE — Procedures (Signed)
  Procedure: Gastrostomy tube placement 38f EBL:   minimal Complications:  none immediate  See full dictation in [employer].  Thora Lance MD Main # [phone] Pager  [phone] Mobile [phone]

## 2021-02-22 LAB — BASIC METABOLIC PANEL
Anion gap: 8 (ref 5–15)
BUN: 16 mg/dL (ref 8–23)
CO2: 29 mmol/L (ref 22–32)
Calcium: 8.6 mg/dL — ABNORMAL LOW (ref 8.9–10.3)
Chloride: 99 mmol/L (ref 98–111)
Creatinine, Ser: 1.44 mg/dL — ABNORMAL HIGH (ref 0.61–1.24)
GFR, Estimated: 52 mL/min — ABNORMAL LOW (ref >60)
Glucose, Bld: 121 mg/dL — ABNORMAL HIGH (ref 70–99)
Potassium: 3.7 mmol/L (ref 3.5–5.1)
Sodium: 136 mmol/L (ref 135–145)

## 2021-02-22 LAB — CBC
HCT: 35.5 % — ABNORMAL LOW (ref 39.0–52.0)
Hemoglobin: 11.6 g/dL — ABNORMAL LOW (ref 13.0–17.0)
MCH: 31.3 pg (ref 26.0–34.0)
MCHC: 32.7 g/dL (ref 30.0–36.0)
MCV: 95.7 fL (ref 80.0–100.0)
Platelets: 157 10*3/uL (ref 150–400)
RBC: 3.71 MIL/uL — ABNORMAL LOW (ref 4.22–5.81)
RDW: 13.1 % (ref 11.5–15.5)
WBC: 10 10*3/uL (ref 4.0–10.5)
nRBC: 0 % (ref 0.0–0.2)

## 2021-02-23 ENCOUNTER — Other Ambulatory Visit (HOSPITAL_COMMUNITY): Payer: Medicare Other

## 2021-02-25 LAB — BASIC METABOLIC PANEL
Anion gap: 5 (ref 5–15)
BUN: 18 mg/dL (ref 8–23)
CO2: 29 mmol/L (ref 22–32)
Calcium: 8.3 mg/dL — ABNORMAL LOW (ref 8.9–10.3)
Chloride: 104 mmol/L (ref 98–111)
Creatinine, Ser: 1.05 mg/dL (ref 0.61–1.24)
GFR, Estimated: >60 mL/min (ref >60)
Glucose, Bld: 130 mg/dL — ABNORMAL HIGH (ref 70–99)
Potassium: 3.5 mmol/L (ref 3.5–5.1)
Sodium: 138 mmol/L (ref 135–145)

## 2021-02-25 LAB — CBC
HCT: 31.3 % — ABNORMAL LOW (ref 39.0–52.0)
Hemoglobin: 10.3 g/dL — ABNORMAL LOW (ref 13.0–17.0)
MCH: 31.3 pg (ref 26.0–34.0)
MCHC: 32.9 g/dL (ref 30.0–36.0)
MCV: 95.1 fL (ref 80.0–100.0)
Platelets: 167 10*3/uL (ref 150–400)
RBC: 3.29 MIL/uL — ABNORMAL LOW (ref 4.22–5.81)
RDW: 13 % (ref 11.5–15.5)
WBC: 5.8 10*3/uL (ref 4.0–10.5)
nRBC: 0 % (ref 0.0–0.2)

## 2021-02-26 NOTE — Progress Notes (Signed)
Pulmonary Critical Care Medicine [facility] [place]   PULMONARY CRITICAL CARE SERVICE  PROGRESS NOTE     Adam Mcclain  IRW:[id number]  DOB: [birth year]   DOA: 02/02/2021  Referring Physician: Carron Curie, MD  HPI: Adam Mcclain is a 69 y.o. male seen for follow up of Acute on Chronic Respiratory Failure.  Patient is capping currently on room air seems to be tolerating it fairly well  Medications: Reviewed on Rounds  Physical Exam:  Vitals: Temperature is 96.7 pulse 92 respiratory 27 blood pressure is 131/82 saturations 92%  Ventilator Settings capping right now on room air  General: Comfortable at this time ENT: grossly unremarkable Neck: no obvious mass Cardiovascular: no malignant arrhythmias Respiratory: Scattered rhonchi expansion is equal Abdomen: soft Skin: no rash seen on limited exam Musculoskeletal: not rigid Psychiatric:unable to assess Neurologic: no seizure no involuntary movements         Lab Data:   Basic Metabolic Panel: Recent Labs  Lab 02/22/21 0415 02/25/21 0324  NA 136 138  K 3.7 3.5  CL 99 104  CO2 29 29  GLUCOSE 121* 130*  BUN 16 18  CREATININE 1.44* 1.05  CALCIUM 8.6* 8.3*    ABG: No results for input(s): PHART, PCO2ART, PO2ART, HCO3, O2SAT in the last 168 hours.  Liver Function Tests: No results for input(s): AST, ALT, ALKPHOS, BILITOT, PROT, ALBUMIN in the last 168 hours. No results for input(s): LIPASE, AMYLASE in the last 168 hours. No results for input(s): AMMONIA in the last 168 hours.  CBC: Recent Labs  Lab 02/22/21 0415 02/25/21 0324  WBC 10.0 5.8  HGB 11.6* 10.3*  HCT 35.5* 31.3*  MCV 95.7 95.1  PLT 157 167    Cardiac Enzymes: No results for input(s): CKTOTAL, CKMB, CKMBINDEX, TROPONINI in the last 168 hours.  BNP (last 3 results) No results for input(s): BNP in the last 8760 hours.  ProBNP (last 3 results) No results for input(s): PROBNP in the last 8760 hours.  Radiological Exams: No  results found.  Assessment/Plan Active Problems:   Acute on chronic respiratory failure with hypoxia (HCC)   Seizure disorder (HCC)   Acute stroke due to ischemia [facility])   Paroxysmal atrial fibrillation (HCC)   Tracheostomy status (HCC)   Acute on chronic respiratory failure hypoxia plan is going to be to continue with capping trials secretions are copious. Seizure disorder no sign of active seizure at this time we will continue to follow along Acute stroke no change continue with supportive care Paroxysmal atrial fibrillation rate is controlled Tracheostomy remains in place for now while we will continue to do capping because of requirement for suctioning   I have personally seen and evaluated the patient, evaluated laboratory and imaging results, formulated the assessment and plan and placed orders. The Patient requires high complexity decision making with multiple systems involvement.  Rounds were done with the Respiratory Therapy Director and Staff therapists and discussed with nursing staff also.  Yevonne Pax, MD [facility] Pulmonary Critical Care Medicine Sleep Medicine

## 2021-02-27 NOTE — Progress Notes (Signed)
Pulmonary Critical Care Medicine [facility] [place]   PULMONARY CRITICAL CARE SERVICE  PROGRESS NOTE     Adam Mcclain  YIF:[id number]  DOB: 1950-10-16   DOA: 02/02/2021  Referring Physician: Carron Curie, MD  HPI: Adam Mcclain is a 70 y.o. male being followed for ventilator/airway/oxygen weaning Acute on Chronic Respiratory Failure.  Patient currently is on T collar has been on room air has been doing well should be able to try capping today  Medications: Reviewed on Rounds  Physical Exam:  Vitals: Temperature is 97 pulse 90 respiratory 23 blood pressure is 130/71 saturations 100%  Ventilator Settings off the ventilator on T collar  General: Comfortable at this time Neck: supple Cardiovascular: no malignant arrhythmias Respiratory: Scattered rhonchi expansion is equal Skin: no rash seen on limited exam Musculoskeletal: No gross abnormality Psychiatric:unable to assess Neurologic:no involuntary movements         Lab Data:   Basic Metabolic Panel: Recent Labs  Lab 02/22/21 0415 02/25/21 0324  NA 136 138  K 3.7 3.5  CL 99 104  CO2 29 29  GLUCOSE 121* 130*  BUN 16 18  CREATININE 1.44* 1.05  CALCIUM 8.6* 8.3*    ABG: No results for input(s): PHART, PCO2ART, PO2ART, HCO3, O2SAT in the last 168 hours.  Liver Function Tests: No results for input(s): AST, ALT, ALKPHOS, BILITOT, PROT, ALBUMIN in the last 168 hours. No results for input(s): LIPASE, AMYLASE in the last 168 hours. No results for input(s): AMMONIA in the last 168 hours.  CBC: Recent Labs  Lab 02/22/21 0415 02/25/21 0324  WBC 10.0 5.8  HGB 11.6* 10.3*  HCT 35.5* 31.3*  MCV 95.7 95.1  PLT 157 167    Cardiac Enzymes: No results for input(s): CKTOTAL, CKMB, CKMBINDEX, TROPONINI in the last 168 hours.  BNP (last 3 results) No results for input(s): BNP in the last 8760 hours.  ProBNP (last 3 results) No results for input(s): PROBNP in the last 8760 hours.  Radiological  Exams: No results found.  Assessment/Plan Active Problems:   Acute on chronic respiratory failure with hypoxia (HCC)   Seizure disorder (HCC)   Acute stroke due to ischemia (HCC)   Paroxysmal atrial fibrillation (HCC)   Tracheostomy status (HCC)   Acute on chronic respiratory failure hypoxia plan is going to be to start capping once again apparently was on capping overnight not clear as to why Seizure disorder no active seizure noted at this time Acute stroke no change we will continue to follow along Paroxysmal atrial fibrillation rate is controlled Tracheostomy remains in place   I have personally seen and evaluated the patient, evaluated laboratory and imaging results, formulated the assessment and plan and placed orders. The Patient requires high complexity decision making with multiple systems involvement.  Rounds were done with the Respiratory Therapy Director and Staff therapists and discussed with nursing staff also.  Yevonne Pax, MD [facility] Pulmonary Critical Care Medicine Sleep Medicine

## 2021-02-28 LAB — CULTURE, RESPIRATORY W GRAM STAIN

## 2021-02-28 LAB — BASIC METABOLIC PANEL
Anion gap: 7 (ref 5–15)
BUN: 22 mg/dL (ref 8–23)
CO2: 26 mmol/L (ref 22–32)
Calcium: 8.3 mg/dL — ABNORMAL LOW (ref 8.9–10.3)
Chloride: 103 mmol/L (ref 98–111)
Creatinine, Ser: 1.08 mg/dL (ref 0.61–1.24)
GFR, Estimated: >60 mL/min (ref >60)
Glucose, Bld: 118 mg/dL — ABNORMAL HIGH (ref 70–99)
Potassium: 4 mmol/L (ref 3.5–5.1)
Sodium: 136 mmol/L (ref 135–145)

## 2021-02-28 LAB — CBC
HCT: 31.8 % — ABNORMAL LOW (ref 39.0–52.0)
Hemoglobin: 10.6 g/dL — ABNORMAL LOW (ref 13.0–17.0)
MCH: 31.5 pg (ref 26.0–34.0)
MCHC: 33.3 g/dL (ref 30.0–36.0)
MCV: 94.4 fL (ref 80.0–100.0)
Platelets: 191 10*3/uL (ref 150–400)
RBC: 3.37 MIL/uL — ABNORMAL LOW (ref 4.22–5.81)
RDW: 13.2 % (ref 11.5–15.5)
WBC: 7.1 10*3/uL (ref 4.0–10.5)
nRBC: 0 % (ref 0.0–0.2)

## 2021-02-28 LAB — MAGNESIUM: Magnesium: 1.9 mg/dL (ref 1.7–2.4)

## 2021-02-28 LAB — SARS CORONAVIRUS 2 (TAT 6-24 HRS): SARS Coronavirus 2: NEGATIVE

## 2021-02-28 NOTE — Progress Notes (Signed)
Pulmonary Critical Care Medicine [facility] [place]   PULMONARY CRITICAL CARE SERVICE  PROGRESS NOTE     Adam Mcclain  [id number]  DOB: [birth year]   DOA: 02/02/2021  Referring Physician: Carron Curie, MD  HPI: Adam Mcclain is a 70 y.o. male being followed for ventilator/airway/oxygen weaning Acute on Chronic Respiratory Failure.  Capping right now has been on room air still some issues with secretions going on  Medications: Reviewed on Rounds  Physical Exam:  Vitals: Temperature is 98.0 pulse 78 respiratory rate 16 blood pressure is 118/65 saturations 90%  Ventilator Settings capping off the ventilator on room air  General: Comfortable at this time Neck: supple Cardiovascular: no malignant arrhythmias Respiratory: Scattered rhonchi coarse breath sounds Skin: no rash seen on limited exam Musculoskeletal: No gross abnormality Psychiatric:unable to assess Neurologic:no involuntary movements         Lab Data:   Basic Metabolic Panel: Recent Labs  Lab 02/22/21 0415 02/25/21 0324 02/28/21 0020  NA 136 138 136  K 3.7 3.5 4.0  CL 99 104 103  CO2 29 29 26   GLUCOSE 121* 130* 118*  BUN 16 18 22   CREATININE 1.44* 1.05 1.08  CALCIUM 8.6* 8.3* 8.3*  MG  --   --  1.9    ABG: No results for input(s): PHART, PCO2ART, PO2ART, HCO3, O2SAT in the last 168 hours.  Liver Function Tests: No results for input(s): AST, ALT, ALKPHOS, BILITOT, PROT, ALBUMIN in the last 168 hours. No results for input(s): LIPASE, AMYLASE in the last 168 hours. No results for input(s): AMMONIA in the last 168 hours.  CBC: Recent Labs  Lab 02/22/21 0415 02/25/21 0324 02/28/21 0020  WBC 10.0 5.8 7.1  HGB 11.6* 10.3* 10.6*  HCT 35.5* 31.3* 31.8*  MCV 95.7 95.1 94.4  PLT 157 167 191    Cardiac Enzymes: No results for input(s): CKTOTAL, CKMB, CKMBINDEX, TROPONINI in the last 168 hours.  BNP (last 3 results) No results for input(s): BNP in the last 8760 hours.  ProBNP  (last 3 results) No results for input(s): PROBNP in the last 8760 hours.  Radiological Exams: No results found.  Assessment/Plan Active Problems:   Acute on chronic respiratory failure with hypoxia (HCC)   Seizure disorder (HCC)   Acute stroke due to ischemia [facility])   Paroxysmal atrial fibrillation (HCC)   Tracheostomy status (HCC)   Acute on chronic respiratory failure hypoxia plan is going to be to continue with capping trials as ordered.  Titrate oxygen continue pulmonary toilet Seizure disorder no sign of active seizure noted at this time Acute stroke no change continue with present management Paroxysmal atrial fibrillation rate is controlled Tracheostomy remains in place   I have personally seen and evaluated the patient, evaluated laboratory and imaging results, formulated the assessment and plan and placed orders. The Patient requires high complexity decision making with multiple systems involvement.  Rounds were done with the Respiratory Therapy Director and Staff therapists and discussed with nursing staff also.  03/02/21, MD [facility] Pulmonary Critical Care Medicine Sleep Medicine

## 2021-03-01 ENCOUNTER — Encounter: Payer: Self-pay | Admitting: Vascular Surgery

## 2021-03-01 DIAGNOSIS — W06XXXA Fall from bed, initial encounter: Secondary | ICD-10-CM | POA: Diagnosis present

## 2021-03-01 DIAGNOSIS — Z7901 Long term (current) use of anticoagulants: Secondary | ICD-10-CM | POA: Diagnosis not present

## 2021-03-01 DIAGNOSIS — I130 Hypertensive heart and chronic kidney disease with heart failure and stage 1 through stage 4 chronic kidney disease, or unspecified chronic kidney disease: Secondary | ICD-10-CM | POA: Diagnosis not present

## 2021-03-01 DIAGNOSIS — D638 Anemia in other chronic diseases classified elsewhere: Secondary | ICD-10-CM | POA: Diagnosis present

## 2021-03-01 DIAGNOSIS — F419 Anxiety disorder, unspecified: Secondary | ICD-10-CM | POA: Diagnosis not present

## 2021-03-01 DIAGNOSIS — I480 Paroxysmal atrial fibrillation: Secondary | ICD-10-CM | POA: Diagnosis not present

## 2021-03-01 DIAGNOSIS — D62 Acute posthemorrhagic anemia: Secondary | ICD-10-CM | POA: Diagnosis not present

## 2021-03-01 DIAGNOSIS — Z66 Do not resuscitate: Secondary | ICD-10-CM | POA: Diagnosis present

## 2021-03-01 DIAGNOSIS — M6281 Muscle weakness (generalized): Secondary | ICD-10-CM | POA: Diagnosis not present

## 2021-03-01 DIAGNOSIS — Y92122 Bedroom in nursing home as the place of occurrence of the external cause: Secondary | ICD-10-CM | POA: Diagnosis not present

## 2021-03-01 DIAGNOSIS — J9611 Chronic respiratory failure with hypoxia: Secondary | ICD-10-CM | POA: Diagnosis present

## 2021-03-01 DIAGNOSIS — I6521 Occlusion and stenosis of right carotid artery: Secondary | ICD-10-CM | POA: Diagnosis present

## 2021-03-01 DIAGNOSIS — F32A Depression, unspecified: Secondary | ICD-10-CM | POA: Diagnosis present

## 2021-03-01 DIAGNOSIS — E440 Moderate protein-calorie malnutrition: Secondary | ICD-10-CM | POA: Diagnosis not present

## 2021-03-01 DIAGNOSIS — E559 Vitamin D deficiency, unspecified: Secondary | ICD-10-CM | POA: Diagnosis not present

## 2021-03-01 DIAGNOSIS — I639 Cerebral infarction, unspecified: Secondary | ICD-10-CM | POA: Diagnosis not present

## 2021-03-01 DIAGNOSIS — I959 Hypotension, unspecified: Secondary | ICD-10-CM | POA: Diagnosis not present

## 2021-03-01 DIAGNOSIS — R569 Unspecified convulsions: Secondary | ICD-10-CM | POA: Diagnosis not present

## 2021-03-01 DIAGNOSIS — B965 Pseudomonas (aeruginosa) (mallei) (pseudomallei) as the cause of diseases classified elsewhere: Secondary | ICD-10-CM | POA: Diagnosis not present

## 2021-03-01 DIAGNOSIS — S72102A Unspecified trochanteric fracture of left femur, initial encounter for closed fracture: Secondary | ICD-10-CM | POA: Diagnosis not present

## 2021-03-01 DIAGNOSIS — J449 Chronic obstructive pulmonary disease, unspecified: Secondary | ICD-10-CM | POA: Diagnosis not present

## 2021-03-01 DIAGNOSIS — N189 Chronic kidney disease, unspecified: Secondary | ICD-10-CM | POA: Diagnosis not present

## 2021-03-01 DIAGNOSIS — J208 Acute bronchitis due to other specified organisms: Secondary | ICD-10-CM | POA: Diagnosis not present

## 2021-03-01 DIAGNOSIS — S72142A Displaced intertrochanteric fracture of left femur, initial encounter for closed fracture: Secondary | ICD-10-CM | POA: Diagnosis not present

## 2021-03-01 DIAGNOSIS — I10 Essential (primary) hypertension: Secondary | ICD-10-CM | POA: Diagnosis not present

## 2021-03-01 DIAGNOSIS — Z7982 Long term (current) use of aspirin: Secondary | ICD-10-CM | POA: Diagnosis not present

## 2021-03-01 DIAGNOSIS — E46 Unspecified protein-calorie malnutrition: Secondary | ICD-10-CM | POA: Diagnosis not present

## 2021-03-01 DIAGNOSIS — E441 Mild protein-calorie malnutrition: Secondary | ICD-10-CM | POA: Diagnosis present

## 2021-03-01 DIAGNOSIS — Z931 Gastrostomy status: Secondary | ICD-10-CM | POA: Diagnosis not present

## 2021-03-01 DIAGNOSIS — Z930 Tracheostomy status: Secondary | ICD-10-CM | POA: Diagnosis not present

## 2021-03-01 DIAGNOSIS — Z8249 Family history of ischemic heart disease and other diseases of the circulatory system: Secondary | ICD-10-CM | POA: Diagnosis not present

## 2021-03-01 DIAGNOSIS — J9621 Acute and chronic respiratory failure with hypoxia: Secondary | ICD-10-CM | POA: Diagnosis not present

## 2021-03-01 DIAGNOSIS — K219 Gastro-esophageal reflux disease without esophagitis: Secondary | ICD-10-CM | POA: Diagnosis present

## 2021-03-01 DIAGNOSIS — Z7902 Long term (current) use of antithrombotics/antiplatelets: Secondary | ICD-10-CM | POA: Diagnosis not present

## 2021-03-01 DIAGNOSIS — E781 Pure hyperglyceridemia: Secondary | ICD-10-CM | POA: Diagnosis not present

## 2021-03-01 DIAGNOSIS — I739 Peripheral vascular disease, unspecified: Secondary | ICD-10-CM | POA: Diagnosis present

## 2021-03-01 DIAGNOSIS — Z833 Family history of diabetes mellitus: Secondary | ICD-10-CM | POA: Diagnosis not present

## 2021-03-01 DIAGNOSIS — G40909 Epilepsy, unspecified, not intractable, without status epilepticus: Secondary | ICD-10-CM | POA: Diagnosis not present

## 2021-03-01 DIAGNOSIS — Z20822 Contact with and (suspected) exposure to covid-19: Secondary | ICD-10-CM | POA: Diagnosis present

## 2021-03-01 DIAGNOSIS — R627 Adult failure to thrive: Secondary | ICD-10-CM | POA: Diagnosis present

## 2021-03-01 DIAGNOSIS — R739 Hyperglycemia, unspecified: Secondary | ICD-10-CM | POA: Diagnosis not present

## 2021-03-01 DIAGNOSIS — R1312 Dysphagia, oropharyngeal phase: Secondary | ICD-10-CM | POA: Diagnosis not present

## 2021-03-01 NOTE — Progress Notes (Signed)
Pulmonary Critical Care Medicine [facility] [place]   PULMONARY CRITICAL CARE SERVICE  PROGRESS NOTE     [name]  ZDG:[id number]  DOB: [birth year]   DOA: 02/02/2021  Referring Physician: Carron Curie, MD  HPI: Adam Mcclain is a 70 y.o. male being followed for ventilator/airway/oxygen weaning Acute on Chronic Respiratory Failure.  Patient is capping right now is on room air appears to be comfortable without any distress  Medications: Reviewed on Rounds  Physical Exam:  Vitals: Temperature is 97.6 pulse 74 respiratory 22 blood pressure is 131/78 saturations 95%  Ventilator Settings capping currently on room air  General: Comfortable at this time Neck: supple Cardiovascular: no malignant arrhythmias Respiratory: No rhonchi very coarse breath sound Skin: no rash seen on limited exam Musculoskeletal: No gross abnormality Psychiatric:unable to assess Neurologic:no involuntary movements         Lab Data:   Basic Metabolic Panel: Recent Labs  Lab 02/25/21 0324 02/28/21 0020  NA 138 136  K 3.5 4.0  CL 104 103  CO2 29 26  GLUCOSE 130* 118*  BUN 18 22  CREATININE 1.05 1.08  CALCIUM 8.3* 8.3*  MG  --  1.9    ABG: No results for input(s): PHART, PCO2ART, PO2ART, HCO3, O2SAT in the last 168 hours.  Liver Function Tests: No results for input(s): AST, ALT, ALKPHOS, BILITOT, PROT, ALBUMIN in the last 168 hours. No results for input(s): LIPASE, AMYLASE in the last 168 hours. No results for input(s): AMMONIA in the last 168 hours.  CBC: Recent Labs  Lab 02/25/21 0324 02/28/21 0020  WBC 5.8 7.1  HGB 10.3* 10.6*  HCT 31.3* 31.8*  MCV 95.1 94.4  PLT 167 191    Cardiac Enzymes: No results for input(s): CKTOTAL, CKMB, CKMBINDEX, TROPONINI in the last 168 hours.  BNP (last 3 results) No results for input(s): BNP in the last 8760 hours.  ProBNP (last 3 results) No results for input(s): PROBNP in the last 8760 hours.  Radiological Exams: No  results found.  Assessment/Plan Active Problems:   Acute on chronic respiratory failure with hypoxia (HCC)   Seizure disorder (HCC)   Acute stroke due to ischemia (HCC)   Paroxysmal atrial fibrillation (HCC)   Tracheostomy status (HCC)   Acute on chronic respiratory failure hypoxia we will continue with capping trials continue with pulmonary toilet secretion management Seizure disorder no sign of any active seizures at this time Acute stroke no change we will continue to monitor closely. Paroxysmal atrial fibrillation rate is controlled Tracheostomy doing fine with capping   I have personally seen and evaluated the patient, evaluated laboratory and imaging results, formulated the assessment and plan and placed orders. The Patient requires high complexity decision making with multiple systems involvement.  Rounds were done with the Respiratory Therapy Director and Staff therapists and discussed with nursing staff also.  Yevonne Pax, MD [facility] Pulmonary Critical Care Medicine Sleep Medicine

## 2021-03-02 ENCOUNTER — Other Ambulatory Visit: Payer: Self-pay

## 2021-03-02 ENCOUNTER — Inpatient Hospital Stay (HOSPITAL_COMMUNITY)
Admission: EM | Admit: 2021-03-02 | Discharge: 2021-03-07 | DRG: 481 | Disposition: A | Discharge status: Skilled Nursing Facility | Payer: Medicare Other | Source: Skilled Nursing Facility | Attending: Family Medicine | Admitting: Family Medicine

## 2021-03-02 ENCOUNTER — Emergency Department (HOSPITAL_COMMUNITY): Payer: Medicare Other

## 2021-03-02 ENCOUNTER — Encounter (HOSPITAL_COMMUNITY): Payer: Self-pay | Admitting: *Deleted

## 2021-03-02 DIAGNOSIS — I6521 Occlusion and stenosis of right carotid artery: Secondary | ICD-10-CM | POA: Diagnosis present

## 2021-03-02 DIAGNOSIS — Z6824 Body mass index (BMI) 24.0-24.9, adult: Secondary | ICD-10-CM

## 2021-03-02 DIAGNOSIS — S72142A Displaced intertrochanteric fracture of left femur, initial encounter for closed fracture: Secondary | ICD-10-CM | POA: Diagnosis not present

## 2021-03-02 DIAGNOSIS — F32A Depression, unspecified: Secondary | ICD-10-CM | POA: Diagnosis present

## 2021-03-02 DIAGNOSIS — R739 Hyperglycemia, unspecified: Secondary | ICD-10-CM | POA: Diagnosis present

## 2021-03-02 DIAGNOSIS — J208 Acute bronchitis due to other specified organisms: Secondary | ICD-10-CM | POA: Diagnosis not present

## 2021-03-02 DIAGNOSIS — E559 Vitamin D deficiency, unspecified: Secondary | ICD-10-CM | POA: Diagnosis not present

## 2021-03-02 DIAGNOSIS — I959 Hypotension, unspecified: Secondary | ICD-10-CM | POA: Diagnosis not present

## 2021-03-02 DIAGNOSIS — R400 Somnolence: Secondary | ICD-10-CM | POA: Diagnosis not present

## 2021-03-02 DIAGNOSIS — Z931 Gastrostomy status: Secondary | ICD-10-CM

## 2021-03-02 DIAGNOSIS — J9611 Chronic respiratory failure with hypoxia: Secondary | ICD-10-CM | POA: Diagnosis not present

## 2021-03-02 DIAGNOSIS — Z9889 Other specified postprocedural states: Secondary | ICD-10-CM

## 2021-03-02 DIAGNOSIS — E441 Mild protein-calorie malnutrition: Secondary | ICD-10-CM | POA: Diagnosis not present

## 2021-03-02 DIAGNOSIS — G40909 Epilepsy, unspecified, not intractable, without status epilepticus: Secondary | ICD-10-CM | POA: Diagnosis present

## 2021-03-02 DIAGNOSIS — B370 Candidal stomatitis: Secondary | ICD-10-CM | POA: Diagnosis not present

## 2021-03-02 DIAGNOSIS — E785 Hyperlipidemia, unspecified: Secondary | ICD-10-CM | POA: Diagnosis not present

## 2021-03-02 DIAGNOSIS — S72102A Unspecified trochanteric fracture of left femur, initial encounter for closed fracture: Secondary | ICD-10-CM | POA: Diagnosis not present

## 2021-03-02 DIAGNOSIS — M549 Dorsalgia, unspecified: Secondary | ICD-10-CM | POA: Diagnosis not present

## 2021-03-02 DIAGNOSIS — S72142D Displaced intertrochanteric fracture of left femur, subsequent encounter for closed fracture with routine healing: Secondary | ICD-10-CM | POA: Diagnosis not present

## 2021-03-02 DIAGNOSIS — Z66 Do not resuscitate: Secondary | ICD-10-CM | POA: Diagnosis present

## 2021-03-02 DIAGNOSIS — Z7982 Long term (current) use of aspirin: Secondary | ICD-10-CM

## 2021-03-02 DIAGNOSIS — W19XXXA Unspecified fall, initial encounter: Secondary | ICD-10-CM

## 2021-03-02 DIAGNOSIS — Y92122 Bedroom in nursing home as the place of occurrence of the external cause: Secondary | ICD-10-CM | POA: Diagnosis not present

## 2021-03-02 DIAGNOSIS — R627 Adult failure to thrive: Secondary | ICD-10-CM | POA: Diagnosis present

## 2021-03-02 DIAGNOSIS — E46 Unspecified protein-calorie malnutrition: Secondary | ICD-10-CM | POA: Diagnosis not present

## 2021-03-02 DIAGNOSIS — K219 Gastro-esophageal reflux disease without esophagitis: Secondary | ICD-10-CM | POA: Diagnosis present

## 2021-03-02 DIAGNOSIS — Z930 Tracheostomy status: Secondary | ICD-10-CM

## 2021-03-02 DIAGNOSIS — Z8249 Family history of ischemic heart disease and other diseases of the circulatory system: Secondary | ICD-10-CM

## 2021-03-02 DIAGNOSIS — E440 Moderate protein-calorie malnutrition: Secondary | ICD-10-CM | POA: Diagnosis not present

## 2021-03-02 DIAGNOSIS — I700 Atherosclerosis of aorta: Secondary | ICD-10-CM | POA: Diagnosis not present

## 2021-03-02 DIAGNOSIS — D638 Anemia in other chronic diseases classified elsewhere: Secondary | ICD-10-CM | POA: Diagnosis present

## 2021-03-02 DIAGNOSIS — Z7902 Long term (current) use of antithrombotics/antiplatelets: Secondary | ICD-10-CM

## 2021-03-02 DIAGNOSIS — D500 Iron deficiency anemia secondary to blood loss (chronic): Secondary | ICD-10-CM | POA: Diagnosis not present

## 2021-03-02 DIAGNOSIS — I10 Essential (primary) hypertension: Secondary | ICD-10-CM | POA: Diagnosis present

## 2021-03-02 DIAGNOSIS — Z8673 Personal history of transient ischemic attack (TIA), and cerebral infarction without residual deficits: Secondary | ICD-10-CM | POA: Diagnosis not present

## 2021-03-02 DIAGNOSIS — R1312 Dysphagia, oropharyngeal phase: Secondary | ICD-10-CM | POA: Diagnosis not present

## 2021-03-02 DIAGNOSIS — Z833 Family history of diabetes mellitus: Secondary | ICD-10-CM | POA: Diagnosis not present

## 2021-03-02 DIAGNOSIS — S7292XD Unspecified fracture of left femur, subsequent encounter for closed fracture with routine healing: Secondary | ICD-10-CM | POA: Diagnosis not present

## 2021-03-02 DIAGNOSIS — R7401 Elevation of levels of liver transaminase levels: Secondary | ICD-10-CM | POA: Diagnosis present

## 2021-03-02 DIAGNOSIS — Z7901 Long term (current) use of anticoagulants: Secondary | ICD-10-CM

## 2021-03-02 DIAGNOSIS — M1990 Unspecified osteoarthritis, unspecified site: Secondary | ICD-10-CM | POA: Diagnosis not present

## 2021-03-02 DIAGNOSIS — D649 Anemia, unspecified: Secondary | ICD-10-CM | POA: Diagnosis present

## 2021-03-02 DIAGNOSIS — D62 Acute posthemorrhagic anemia: Secondary | ICD-10-CM | POA: Diagnosis not present

## 2021-03-02 DIAGNOSIS — F419 Anxiety disorder, unspecified: Secondary | ICD-10-CM | POA: Diagnosis not present

## 2021-03-02 DIAGNOSIS — Z20822 Contact with and (suspected) exposure to covid-19: Secondary | ICD-10-CM | POA: Diagnosis present

## 2021-03-02 DIAGNOSIS — R569 Unspecified convulsions: Secondary | ICD-10-CM

## 2021-03-02 DIAGNOSIS — Z8781 Personal history of (healed) traumatic fracture: Secondary | ICD-10-CM

## 2021-03-02 DIAGNOSIS — W06XXXA Fall from bed, initial encounter: Secondary | ICD-10-CM | POA: Diagnosis present

## 2021-03-02 DIAGNOSIS — I779 Disorder of arteries and arterioles, unspecified: Secondary | ICD-10-CM | POA: Diagnosis present

## 2021-03-02 DIAGNOSIS — Z79899 Other long term (current) drug therapy: Secondary | ICD-10-CM

## 2021-03-02 DIAGNOSIS — J9621 Acute and chronic respiratory failure with hypoxia: Secondary | ICD-10-CM | POA: Diagnosis not present

## 2021-03-02 DIAGNOSIS — I480 Paroxysmal atrial fibrillation: Secondary | ICD-10-CM | POA: Diagnosis not present

## 2021-03-02 DIAGNOSIS — I130 Hypertensive heart and chronic kidney disease with heart failure and stage 1 through stage 4 chronic kidney disease, or unspecified chronic kidney disease: Secondary | ICD-10-CM | POA: Diagnosis not present

## 2021-03-02 DIAGNOSIS — F17290 Nicotine dependence, other tobacco product, uncomplicated: Secondary | ICD-10-CM | POA: Diagnosis present

## 2021-03-02 DIAGNOSIS — M25552 Pain in left hip: Secondary | ICD-10-CM | POA: Diagnosis not present

## 2021-03-02 DIAGNOSIS — I6529 Occlusion and stenosis of unspecified carotid artery: Secondary | ICD-10-CM | POA: Diagnosis not present

## 2021-03-02 DIAGNOSIS — J449 Chronic obstructive pulmonary disease, unspecified: Secondary | ICD-10-CM | POA: Diagnosis not present

## 2021-03-02 DIAGNOSIS — I739 Peripheral vascular disease, unspecified: Secondary | ICD-10-CM | POA: Diagnosis not present

## 2021-03-02 DIAGNOSIS — R911 Solitary pulmonary nodule: Secondary | ICD-10-CM | POA: Diagnosis not present

## 2021-03-02 DIAGNOSIS — Z419 Encounter for procedure for purposes other than remedying health state, unspecified: Secondary | ICD-10-CM

## 2021-03-02 DIAGNOSIS — N281 Cyst of kidney, acquired: Secondary | ICD-10-CM | POA: Diagnosis not present

## 2021-03-02 DIAGNOSIS — D72828 Other elevated white blood cell count: Secondary | ICD-10-CM | POA: Diagnosis present

## 2021-03-02 DIAGNOSIS — Q446 Cystic disease of liver: Secondary | ICD-10-CM | POA: Diagnosis not present

## 2021-03-02 DIAGNOSIS — R1310 Dysphagia, unspecified: Secondary | ICD-10-CM | POA: Diagnosis present

## 2021-03-02 HISTORY — DX: Anxiety disorder, unspecified: F41.9

## 2021-03-02 HISTORY — DX: Gastrostomy status: Z93.1

## 2021-03-02 HISTORY — DX: Chronic obstructive pulmonary disease, unspecified: J44.9

## 2021-03-02 HISTORY — DX: Peripheral vascular disease, unspecified: I73.9

## 2021-03-02 LAB — CBC WITH DIFFERENTIAL/PLATELET
Abs Immature Granulocytes: 0.07 10*3/uL (ref 0.00–0.07)
Basophils Absolute: 0 10*3/uL (ref 0.0–0.1)
Basophils Relative: 0 %
Eosinophils Absolute: 0 10*3/uL (ref 0.0–0.5)
Eosinophils Relative: 0 %
HCT: 34.2 % — ABNORMAL LOW (ref 39.0–52.0)
Hemoglobin: 10.9 g/dL — ABNORMAL LOW (ref 13.0–17.0)
Immature Granulocytes: 1 %
Lymphocytes Relative: 6 %
Lymphs Abs: 0.7 10*3/uL (ref 0.7–4.0)
MCH: 31.4 pg (ref 26.0–34.0)
MCHC: 31.9 g/dL (ref 30.0–36.0)
MCV: 98.6 fL (ref 80.0–100.0)
Monocytes Absolute: 0.6 10*3/uL (ref 0.1–1.0)
Monocytes Relative: 5 %
Neutro Abs: 10.4 10*3/uL — ABNORMAL HIGH (ref 1.7–7.7)
Neutrophils Relative %: 88 %
Platelets: 252 10*3/uL (ref 150–400)
RBC: 3.47 MIL/uL — ABNORMAL LOW (ref 4.22–5.81)
RDW: 13.2 % (ref 11.5–15.5)
WBC: 11.9 10*3/uL — ABNORMAL HIGH (ref 4.0–10.5)
nRBC: 0 % (ref 0.0–0.2)

## 2021-03-02 LAB — COMPREHENSIVE METABOLIC PANEL
ALT: 69 U/L — ABNORMAL HIGH (ref 0–44)
AST: 45 U/L — ABNORMAL HIGH (ref 15–41)
Albumin: 3 g/dL — ABNORMAL LOW (ref 3.5–5.0)
Alkaline Phosphatase: 110 U/L (ref 38–126)
Anion gap: 9 (ref 5–15)
BUN: 24 mg/dL — ABNORMAL HIGH (ref 8–23)
CO2: 28 mmol/L (ref 22–32)
Calcium: 8.5 mg/dL — ABNORMAL LOW (ref 8.9–10.3)
Chloride: 99 mmol/L (ref 98–111)
Creatinine, Ser: 1.14 mg/dL (ref 0.61–1.24)
GFR, Estimated: >60 mL/min (ref >60)
Glucose, Bld: 114 mg/dL — ABNORMAL HIGH (ref 70–99)
Potassium: 4.2 mmol/L (ref 3.5–5.1)
Sodium: 136 mmol/L (ref 135–145)
Total Bilirubin: 0.2 mg/dL — ABNORMAL LOW (ref 0.3–1.2)
Total Protein: 7 g/dL (ref 6.5–8.1)

## 2021-03-02 LAB — TYPE AND SCREEN
ABO/RH(D): O POS
Antibody Screen: NEGATIVE

## 2021-03-02 LAB — RESP PANEL BY RT-PCR (FLU A&B, COVID) ARPGX2
Influenza A by PCR: NEGATIVE
Influenza B by PCR: NEGATIVE
SARS Coronavirus 2 by RT PCR: NEGATIVE

## 2021-03-02 LAB — PROTIME-INR
INR: 1.6 — ABNORMAL HIGH (ref 0.8–1.2)
Prothrombin Time: 18.8 seconds — ABNORMAL HIGH (ref 11.4–15.2)

## 2021-03-02 MED ORDER — ONDANSETRON HCL 4 MG PO TABS: 4.0000 mg | ORAL_TABLET | Freq: Four times a day (QID) | ORAL | Status: DC | PRN

## 2021-03-02 MED ORDER — ONDANSETRON HCL 4 MG/2ML IJ SOLN
4.0000 mg | Freq: Four times a day (QID) | INTRAMUSCULAR | Status: DC | PRN
  Administered 2021-03-02: 4 mg via INTRAVENOUS
  Filled 2021-03-02: qty 2

## 2021-03-02 MED ORDER — HYDROMORPHONE HCL 1 MG/ML IJ SOLN
0.5000 mg | INTRAMUSCULAR | Status: DC | PRN
  Administered 2021-03-02 – 2021-03-04 (×7): 0.5 mg via INTRAVENOUS
  Filled 2021-03-02 (×3): qty 0.5
  Filled 2021-03-02 (×4): qty 1

## 2021-03-02 MED ORDER — ACETAMINOPHEN 325 MG PO TABS
650.0000 mg | ORAL_TABLET | Freq: Four times a day (QID) | ORAL | Status: DC | PRN
  Administered 2021-03-04 – 2021-03-05 (×2): 650 mg via ORAL
  Filled 2021-03-02 (×2): qty 2

## 2021-03-02 MED ORDER — LEVETIRACETAM 100 MG/ML PO SOLN
1000.0000 mg | Freq: Two times a day (BID) | ORAL | Status: DC
  Administered 2021-03-02: 1000 mg
  Filled 2021-03-02 (×6): qty 10

## 2021-03-02 MED ORDER — ACETAMINOPHEN 650 MG RE SUPP
650.0000 mg | Freq: Four times a day (QID) | RECTAL | Status: DC | PRN
  Filled 2021-03-02: qty 1

## 2021-03-02 MED ORDER — CEFAZOLIN SODIUM-DEXTROSE 2-4 GM/100ML-% IV SOLN
2.0000 g | INTRAVENOUS | Status: AC
  Administered 2021-03-03: 2 g via INTRAVENOUS

## 2021-03-02 MED ORDER — CHLORHEXIDINE GLUCONATE 4 % EX LIQD
60.0000 mL | Freq: Once | CUTANEOUS | Status: DC
  Filled 2021-03-02: qty 60

## 2021-03-02 MED ORDER — POVIDONE-IODINE 10 % EX SWAB
2.0000 "application " | Freq: Once | CUTANEOUS | Status: AC
  Administered 2021-03-03: 2 via TOPICAL

## 2021-03-02 MED ORDER — ACETAMINOPHEN 500 MG PO TABS
1000.0000 mg | ORAL_TABLET | Freq: Once | ORAL | Status: AC
  Administered 2021-03-03: 1000 mg via ORAL

## 2021-03-02 MED ORDER — OXYCODONE HCL 5 MG PO TABS
5.0000 mg | ORAL_TABLET | ORAL | Status: DC | PRN
  Administered 2021-03-04 – 2021-03-05 (×2): 5 mg via ORAL
  Filled 2021-03-02 (×2): qty 1

## 2021-03-02 NOTE — ED Triage Notes (Signed)
Pt brought in by [facility] from [facility] with c/o unwitnessed fall this morning. Xray done at facility and showed a left hip fx. Severe pain. 18g lt a/c. EMS gave Morhine 4mg  IV, BP 93/54 after Morphine given.

## 2021-03-02 NOTE — ED Provider Notes (Signed)
[facility] EMERGENCY DEPARTMENT Provider Note   CSN: [id number] Arrival date & time: 03/02/21  1642     History Chief Complaint  Patient presents with   Adam Mcclain is a 70 y.o. male.  HPI  Patient with significant medical history of A. fib currently on anticoagulant, CVA, GERD, hypertension, PAD, PVD, chronic respiratory failure with tracheotomy presents from [facility] nursing facility due to a unwitnessed fall.  Patient states he was trying to get out of bed today and reports he lost his balance falling onto his left side.  He denies hitting his head or losing consciousness, he denies headaches, change in vision, paresthesia or weakness in the upper or lower extremities. Patient states he was able to get himself up off the floor and sit on the bed.  He endorses bilateral hip pain states that it is worse on his right versus his left, states that he is unable to bear weight on them.  He denies neck, back pain, chest pain, shortness of breath or abdominal pain.  Denies  alleviating factors at this time.  Patient denies headaches, fevers, chills, shortness breath or chest pain.  Past Medical History:  Diagnosis Date   A-fib (HCC)    Acute on chronic respiratory failure with hypoxia (HCC)    Acute stroke due to ischemia [facility])    Anxiety    Arthritis    Atrial fibrillation (HCC)    Chronic back pain    COPD (chronic obstructive pulmonary disease) (HCC)    Dysrhythmia    A-Fib   Gastrostomy status (HCC)    GERD (gastroesophageal reflux disease)    History of kidney stones    HTN (hypertension)    Hypertension    Nephrolithiasis    PAD (peripheral artery disease) (HCC)    Paroxysmal atrial fibrillation (HCC)    PVD (peripheral vascular disease) (HCC)    Seizure disorder (HCC)    Stroke (HCC)    Tracheostomy status (HCC)     Patient Active Problem List   Diagnosis Date Noted   Closed intertrochanteric fracture of hip, left, initial encounter (HCC) 03/02/2021    Normocytic anemia 03/02/2021   Mild protein-calorie malnutrition (HCC) 03/02/2021   Transaminitis 03/02/2021   Hypertension    Acute on chronic respiratory failure with hypoxia (HCC)    Seizure disorder (HCC)    Acute stroke due to ischemia (HCC)    Paroxysmal atrial fibrillation (HCC)    Tracheostomy status (HCC)    Protein-calorie malnutrition, severe 01/26/2021   Cerebral thrombosis with cerebral infarction 01/24/2021   Pressure injury of skin 01/23/2021   Seizure (HCC) 01/19/2021   Bilateral carotid artery disease (HCC) 06/26/2016   Carotid artery disease (HCC) 05/10/2016   Cold hands 04/23/2013   Peripheral vascular disease, unspecified (HCC) 03/12/2013   Atherosclerosis of native arteries of extremity with intermittent claudication (HCC) 03/12/2013   Lumbago 03/12/2013    Past Surgical History:  Procedure Laterality Date   ABDOMINAL AORTAGRAM  03/25/2013   with Stent   ABDOMINAL AORTAGRAM N/A 03/25/2013   Procedure: ABDOMINAL Ronny Flurry;  Surgeon: Fransisco Hertz, MD;  Location: [facility] CATH LAB;  Service: Cardiovascular;  Laterality: N/A;   AORTOGRAM     CATARACT EXTRACTION Bilateral    CATARACT EXTRACTION, BILATERAL     CHOLECYSTECTOMY     ENDARTERECTOMY Right 06/26/2016   Procedure: ENDARTERECTOMY CAROTID RIGHT;  Surgeon: Fransisco Hertz, MD;  Location: [facility] OR;  Service: Vascular;  Laterality: Right;   EYE SURGERY  IR GASTROSTOMY TUBE MOD SED  02/20/2021   KIDNEY STONE SURGERY     LOWER EXTREMITY ANGIOGRAM N/A 03/25/2013   Procedure: LOWER EXTREMITY ANGIOGRAM;  Surgeon: Fransisco Hertz, MD;  Location: [facility] CATH LAB;  Service: Cardiovascular;  Laterality: N/A;   PATCH ANGIOPLASTY Right 06/26/2016   Procedure: PATCH ANGIOPLASTY RIGHT CAROTID ARTERY;  Surgeon: Fransisco Hertz, MD;  Location: [facility] OR;  Service: Vascular;  Laterality: Right;   PERCUTANEOUS STENT INTERVENTION Right 03/25/2013   Procedure: PERCUTANEOUS STENT INTERVENTION;  Surgeon: Fransisco Hertz, MD;  Location: [facility] CATH LAB;   Service: Cardiovascular;  Laterality: Right;  icast stent to rt common iliac artery   PERIPHERAL VASCULAR CATHETERIZATION N/A 07/18/2016   Procedure: Abdominal Aortogram w/Lower Extremity;  Surgeon: Fransisco Hertz, MD;  Location: [facility] INVASIVE CV LAB;  Service: Cardiovascular;  Laterality: N/A;   PERIPHERAL VASCULAR CATHETERIZATION Right 07/18/2016   Procedure: Peripheral Vascular Balloon Angioplasty;  Surgeon: Fransisco Hertz, MD;  Location: [facility] INVASIVE CV LAB;  Service: Cardiovascular;  Laterality: Right;  Common Iliac   TRACHEOSTOMY TUBE PLACEMENT     YAG LASER APPLICATION Left 02/20/2015   Procedure: YAG LASER APPLICATION;  Surgeon: Susa Simmonds, MD;  Location: [facility] ORS;  Service: Ophthalmology;  Laterality: Left;       Family History  Problem Relation Age of Onset   Hypertension Father    Diabetes Father    Cancer Father     Social History   Tobacco Use   Smoking status: Every Day    Pack years: 0.00    Types: Cigars   Smokeless tobacco: Never   Tobacco comments:    7 cigars a day  Vaping Use   Vaping Use: Never used  Substance Use Topics   Alcohol use: Not Currently   Drug use: No    Home Medications Prior to Admission medications   Medication Sig Start Date End Date Taking? Authorizing Provider  acetaminophen (TYLENOL) 325 MG tablet Place 2 tablets (650 mg total) into feeding tube every 6 (six) hours as needed for fever. 02/02/21   Minor, Vilinda Blanks, NP  ALPRAZolam Prudy Feeler) 0.5 MG tablet Take 0.5 mg by mouth daily.    [provider]  amLODipine (NORVASC) 10 MG tablet Place 1 tablet (10 mg total) into feeding tube daily. 02/03/21   Minor, Vilinda Blanks, NP  apixaban (ELIQUIS) 5 MG TABS tablet Place 1 tablet (5 mg total) into feeding tube 2 (two) times daily. 02/02/21   Minor, Vilinda Blanks, NP  aspirin 81 MG chewable tablet Place 1 tablet (81 mg total) into feeding tube daily. 02/03/21   Minor, Vilinda Blanks, NP  aspirin 81 MG tablet Take 81 mg by mouth daily.    [provider]  atorvastatin (LIPITOR) 40 MG tablet Take 40 mg by mouth daily.  03/22/15   [provider]  atorvastatin (LIPITOR) 40 MG tablet Place 1 tablet (40 mg total) into feeding tube daily. 02/03/21   Minor, Vilinda Blanks, NP  buPROPion (WELLBUTRIN SR) 150 MG 12 hr tablet Take 150 mg by mouth 2 (two) times daily.    [provider]  citalopram (CELEXA) 20 MG tablet Take 20 mg by mouth daily.  03/01/13   [provider]  clonazePAM (KLONOPIN) 0.5 MG tablet Place 1 tablet (0.5 mg total) into feeding tube at bedtime. 02/02/21   Minor, Vilinda Blanks, NP  clopidogrel (PLAVIX) 75 MG tablet Take 1 tablet (75 mg total) by mouth daily. 03/25/13   Fransisco Hertz,  MD  etodolac (LODINE) 400 MG tablet Take 400 mg by mouth 2 (two) times daily.    [provider]  folic acid (FOLVITE) 1 MG tablet Place 1 tablet (1 mg total) into feeding tube daily. 02/03/21   Minor, Vilinda Blanks, NP  ipratropium-albuterol (DUONEB) 0.5-2.5 (3) MG/3ML SOLN Take 3 mLs by nebulization every 6 (six) hours as needed. 02/02/21   Minor, Vilinda Blanks, NP  levETIRAcetam (KEPPRA) 100 MG/ML solution Place 10 mLs (1,000 mg total) into feeding tube 2 (two) times daily. 02/02/21   Minor, Vilinda Blanks, NP  lisinopril-hydrochlorothiazide (PRINZIDE,ZESTORETIC) 20-12.5 MG per tablet Take 1 tablet by mouth daily.     [provider]  lovastatin (MEVACOR) 20 MG tablet Take 20 mg by mouth at bedtime.    [provider]  Multiple Vitamin (MULTIVITAMIN WITH MINERALS) TABS tablet Place 1 tablet into feeding tube daily. 02/03/21   Minor, Vilinda Blanks, NP  Multiple Vitamin (MULTIVITAMIN WITH MINERALS) TABS Take 1 tablet by mouth daily.    [provider]  Nutritional Supplements (FEEDING SUPPLEMENT, OSMOLITE 1.5 CAL,) LIQD Place 1,000 mLs into feeding tube continuous. 02/02/21   Minor, Vilinda Blanks, NP  Nutritional Supplements (FEEDING SUPPLEMENT, PROSOURCE TF,) liquid Place 45 mLs into feeding tube 2 (two) times daily.  02/02/21   Minor, Vilinda Blanks, NP  pantoprazole sodium (PROTONIX) 40 mg/20 mL PACK Place 20 mLs (40 mg total) into feeding tube daily. 02/03/21   Minor, Vilinda Blanks, NP  QUEtiapine (SEROQUEL) 100 MG tablet Place 1 tablet (100 mg total) into feeding tube 2 (two) times daily. 02/02/21   Minor, Vilinda Blanks, NP  thiamine (B-1) 100 MG/ML injection Inject 1 mL (100 mg total) into the vein daily. 02/03/21   Minor, Vilinda Blanks, NP  traMADol (ULTRAM) 50 MG tablet Take 1 tablet (50 mg total) by mouth 3 (three) times daily as needed for moderate pain. 06/27/16   Raymond Gurney, PA-C  traZODone (DESYREL) 50 MG tablet Take 50 mg by mouth at bedtime.    [provider]  vitamin B-12 1000 MCG tablet Place 1 tablet (1,000 mcg total) into feeding tube daily. 02/03/21   Minor, Vilinda Blanks, NP    Allergies    No known allergies  Review of Systems   Review of Systems  Constitutional:  Negative for chills and fever.  HENT:  Negative for congestion.   Respiratory:  Negative for shortness of breath.   Cardiovascular:  Negative for chest pain.  Gastrointestinal:  Negative for abdominal pain, diarrhea, nausea and vomiting.  Genitourinary:  Negative for enuresis.  Musculoskeletal:  Negative for back pain.       Bilateral hip pain.  Skin:  Negative for rash.  Neurological:  Negative for dizziness and headaches.  Hematological:  Does not bruise/bleed easily.   Physical Exam Updated Vital Signs BP 129/79 (BP Location: Left Arm)   Pulse 96   Temp 99 F (37.2 C) (Oral)   Resp 16   Ht  (1.575 m)   Wt 61 kg   SpO2 100%   BMI 24.60 kg/m   Physical Exam Vitals and nursing note reviewed.  Constitutional:      General: He is not in acute distress.    Appearance: He is not ill-appearing.     Comments: Deconditioned state with tracheostomy tube in place.  HENT:     Head: Normocephalic and atraumatic.     Comments: No gross deformities present, no raccoon eyes or battle signs present.    Nose: No  congestion.  Eyes:     Extraocular Movements: Extraocular movements intact.     Conjunctiva/sclera: Conjunctivae normal.  Cardiovascular:     Rate and Rhythm: Normal rate and regular rhythm.     Pulses: Normal pulses.     Heart sounds: No murmur heard.   No friction rub. No gallop.     Comments: Chest was palpated nontender to palpation. Pulmonary:     Effort: No respiratory distress.     Breath sounds: No wheezing, rhonchi or rales.     Comments: Coarse sounding breath sounds Abdominal:     Palpations: Abdomen is soft.     Tenderness: There is no abdominal tenderness. There is no right CVA tenderness or left CVA tenderness.  Musculoskeletal:     Comments: Spine was palpated nontender to palpation, no step-off or deformities present.  Patient is able to move all 4 extremities, slight decreased range of motion bilateral in the hips secondary to pain, neurovascular fully intact.  Hips were palpated they are tender bilaterally, no deformities present.  Skin:    General: Skin is warm and dry.  Neurological:     Mental Status: He is alert.  Psychiatric:        Mood and Affect: Mood normal.    ED Results / Procedures / Treatments   Labs (all labs ordered are listed, but only abnormal results are displayed) Labs Reviewed  COMPREHENSIVE METABOLIC PANEL - Abnormal; Notable for the following components:      Result Value   Glucose, Bld 114 (*)    BUN 24 (*)    Calcium 8.5 (*)    Albumin 3.0 (*)    AST 45 (*)    ALT 69 (*)    Total Bilirubin 0.2 (*)    All other components within normal limits  CBC WITH DIFFERENTIAL/PLATELET - Abnormal; Notable for the following components:   WBC 11.9 (*)    RBC 3.47 (*)    Hemoglobin 10.9 (*)    HCT 34.2 (*)    Neutro Abs 10.4 (*)    All other components within normal limits  PROTIME-INR - Abnormal; Notable for the following components:   Prothrombin Time 18.8 (*)    INR 1.6 (*)    All other components within normal limits  RESP PANEL BY  RT-PCR (FLU A&B, COVID) ARPGX2  CBC  COMPREHENSIVE METABOLIC PANEL  TYPE AND SCREEN    EKG EKG Interpretation  Date/Time:  Friday March 02 2021 20:08:13 EDT Ventricular Rate:  95 PR Interval:  144 QRS Duration: 70 QT Interval:  344 QTC Calculation: 432 R Axis:   68 Text Interpretation: Normal sinus rhythm Nonspecific T wave abnormality Abnormal ECG Confirmed by Vanetta MuldersZackowski, Scott [phone]([id number]) on 03/02/2021 8:26:39 PM  Radiology DG Chest 1 View  Result Date: 03/02/2021 CLINICAL DATA:  Cough and known left hip fracture EXAM: CHEST  1 VIEW COMPARISON:  02/23/2021 FINDINGS: Cardiac shadow is enlarged but stable. Tracheostomy tube is noted in satisfactory position. The lungs are clear bilaterally. No acute bony abnormality is seen. Gastrostomy catheter is noted within the stomach. IMPRESSION: No acute abnormality noted. Electronically Signed   By: Alcide CleverMark  Lukens M.D.   On: 03/02/2021 19:20   CT Head Wo Contrast  Result Date: 03/02/2021 CLINICAL DATA:  Recent fall EXAM: CT HEAD WITHOUT CONTRAST TECHNIQUE: Contiguous axial images were obtained from the base of the skull through the vertex without intravenous contrast. COMPARISON:  01/28/2021 FINDINGS: Brain: Mild atrophic changes and chronic white matter ischemic changes are seen. Findings  of prior PCA infarcts are noted bilaterally stable in appearance from the prior exam. No acute hemorrhage or acute infarct is seen. Vascular: No hyperdense vessel or unexpected calcification. Skull: Normal. Negative for fracture or focal lesion. Sinuses/Orbits: Opacification of the right maxillary antrum is noted increased in the interval from the prior exam. Additionally fluid is noted within the right ethmoid and frontal sinuses chronic in nature. Other: None. IMPRESSION: Chronic atrophic and ischemic changes intracranially. Progressive sinus disease particularly in the right maxillary antrum. Electronically Signed   By: Alcide Clever M.D.   On: 03/02/2021 18:56   DG Hip  Unilat With Pelvis 2-3 Views Left  Result Date: 03/02/2021 CLINICAL DATA:  Recent fall with left hip pain, initial encounter EXAM: DG HIP (WITH OR WITHOUT PELVIS) 2-3V LEFT COMPARISON:  None. FINDINGS: Pelvic ring is intact. Postsurgical changes are noted in the proximal right femur. Comminuted intratrochanteric fracture of the left femur is seen. Impaction is noted at the fracture site. No soft tissue abnormality is noted. IMPRESSION: Comminuted intratrochanteric fracture of the proximal left femur. Electronically Signed   By: Alcide Clever M.D.   On: 03/02/2021 19:17   DG Hip Unilat With Pelvis 2-3 Views Right  Result Date: 03/02/2021 CLINICAL DATA:  Recent fall with right hip pain, initial encounter EXAM: DG HIP (WITH OR WITHOUT PELVIS) 2-3V RIGHT COMPARISON:  None. FINDINGS: Postsurgical changes are noted in the proximal right femur with medullary rod and fixation screw traversing the femoral neck. Healed proximal right femoral fracture is noted. Visualized pelvic ring is unremarkable. No soft tissue abnormality is noted. IMPRESSION: Postsurgical change in the right hip without acute abnormality. Electronically Signed   By: Alcide Clever M.D.   On: 03/02/2021 19:19    Procedures Procedures   Medications Ordered in ED Medications  ondansetron (ZOFRAN) tablet 4 mg ( Oral See Alternative 03/02/21 2148)    Or  ondansetron [facility]) injection 4 mg (4 mg Intravenous Given 03/02/21 2148)  acetaminophen (TYLENOL) tablet 650 mg (has no administration in time range)    Or  acetaminophen (TYLENOL) suppository 650 mg (has no administration in time range)  oxyCODONE (Oxy IR/ROXICODONE) immediate release tablet 5 mg (has no administration in time range)  HYDROmorphone (DILAUDID) injection 0.5 mg (0.5 mg Intravenous Given 03/02/21 2148)  chlorhexidine (HIBICLENS) 4 % liquid 4 application (has no administration in time range)  povidone-iodine 10 % swab 2 application (has no administration in time range)   ceFAZolin (ANCEF) IVPB 2g/100 mL premix (has no administration in time range)  acetaminophen (TYLENOL) tablet 1,000 mg (has no administration in time range)  levETIRAcetam (KEPPRA) 100 MG/ML solution 1,000 mg (has no administration in time range)    ED Course  I have reviewed the triage vital signs and the nursing notes.  Pertinent labs & imaging results that were available during my care of the patient were reviewed by me and considered in my medical decision making (see chart for details).    MDM Rules/Calculators/A&P                         Initial impression-patient presents after a unwitnessed fall.  He is alert, does not appear in acute distress, vital signs reassuring.  Will obtain imaging of head, chest, bilateral hips and reassess.  Work-up-CBC shows leukocytosis 11.9, normocytic anemia with hemoglobin 10.9, CMP shows hyperglycemia 114, liver enzymes appears to be baseline.  CT head negative for acute findings.  Chest x-ray negative for acute findings.  Right hip shows past surgical changes in the right hip without acute abnormalities.  Left hip reveals complicated intertrochanter fracture of the proximal left femur.  Reassessment-updated patient on fracture of his left femur, patient states he is fine with getting it fixed within [facility] versus [facility].  Will speak with our orthopedic surgeon for further recommendations.  Consult-spoke with Dr. Romeo Apple of orthopedic surgeries, due to patient's complex medical history he does not feel he is capable to perform the procedure safely.  He recommends transfer to [facility] for hip replacement there.  Spoke with Dr. Dion Saucier of orthopedic surgery he has excepted the patient and recommends admission to hospitalist team for medical management.  Spoke with Dr. Robb Matar who will admit the patient.  Rule out-low suspicion for intracranial head bleed or CVA as there is no neurodeficits present on exam, CT head is negative for acute findings.  Low  suspicion for spinal cord abnormality or spinal fracture  as spine was palpated  nontender to palpation, no step-off or deformities present.  Patient moves all 4 extremities.  Low suspicion for pneumothorax or rib fracture as lung sounds were clear bilaterally, x-rays negative for acute findings.  Low suspicion for intra-abdominal abnormality as abdomen soft nontender to palpation.  I have low suspicion for systemic infection as patient is nontoxic-appearing, vital signs reassuring no obvious source infection present my exam.  Patient does have leukocytosis of 11.9 I suspect this is a acute phase reactant.   Plan-admit to medicine for left intertrochanteric fracture and transfer to [facility] for fixation.  Final Clinical Impression(s) / ED Diagnoses Final diagnoses:  Closed fracture of trochanter of left femur, initial encounter [facility])  Fall, initial encounter    Rx / DC Orders ED Discharge Orders     None        Barnie Del 03/02/21 2246    Vanetta Mulders, MD 03/07/21 1028

## 2021-03-02 NOTE — Progress Notes (Addendum)
Spoke with EDP.    Patient with left IT hip fx, complex medical history, requested transfer to [facility] from [facility] given complex medical history.   Plan to admit to hospitalists, and surgery tomorrow if optimized.  Will see in AM for full consult.  Keep npo after midnight tonight please.    Eulas Post, MD

## 2021-03-02 NOTE — H&P (Signed)
History and Physical    Adam Mcclain HGD:[id number] DOB: April 19, 1951 DOA: 03/02/2021  PCP: Adam Mcclain, No  Patient coming from: [facility].  I have personally briefly reviewed patient's old medical records in [facility]  Chief Complaint: Fall.  HPI: Adam Mcclain is a 70 y.o. male with medical history significant of paroxysmal atrial fibrillation, chronic respiratory failure with hypoxia, anxiety, osteoarthritis, chronic back pain, COPD, GERD, elderly testis, hypertension, peripheral arterial disease, seizure disorder, status post gastrostomy and tracheostomy who had an unwitnessed fall landing on his left side.  He had an x-ray done at the facility which demonstrated a fracture.  EMS was called and they administered 4 mg of morphine IV.  His blood pressure decreased to 93/54 mmHg after he was given morphine.  He denies LOC, neck pain, lightheadedness, blurred vision, decreased sensation or focal weakness.  No fever, chills, rhinorrhea, sore throat, dyspnea, wheezing or hemoptysis.  Denies chest pain, palpitations, diaphoresis, PND, orthopnea or pitting edema of the lower extremities.  No abdominal pain, nausea, emesis, diarrhea, melena or hematochezia.  No dysuria, frequency or hematuria.  No polyuria, polydipsia, polyphagia or blurred vision.  ED Course: Initial vital signs were temperature 98.8 F, pulse 91, respiration 18, BP 132/81 mmHg O2 sat 94% on tracheostomy collar.  Lab work: CBC showed a white count 11.9, hemoglobin 10.9 g/dL and platelets 962.  CMP had normal electrolytes when was calcium corrected to an albumin 3.0 g/dL.  Glucose was 114, BUN 24 and total bilirubin 0.2 mg/dL.  AST was 45 and ALT 69 units/L.  Imaging: A portable 1 view chest radiograph did not show any acute abnormality.  CT head without contrast showed chronic atrophy and ischemic changes intracranially.  There is progressive sinus disease particularly in the right maxillary antrum.  Left hip x-rays show comminuted  right intertrochanteric fracture of the proximal left femur.  Right hip x-ray shows poor surgical change in the right hip without acute abnormality.  Please see images and full radiology report for further detail.  Review of Systems: As per HPI otherwise all other systems reviewed and are negative.  Past Medical History:  Diagnosis Date   A-fib (HCC)    Acute on chronic respiratory failure with hypoxia (HCC)    Acute stroke due to ischemia [facility])    Anxiety    Arthritis    Atrial fibrillation (HCC)    Chronic back pain    COPD (chronic obstructive pulmonary disease) (HCC)    Dysrhythmia    A-Fib   Gastrostomy status (HCC)    GERD (gastroesophageal reflux disease)    History of kidney stones    HTN (hypertension)    Hypertension    Nephrolithiasis    PAD (peripheral artery disease) (HCC)    Paroxysmal atrial fibrillation (HCC)    PVD (peripheral vascular disease) (HCC)    Seizure disorder (HCC)    Stroke (HCC)    Tracheostomy status (HCC)     Past Surgical History:  Procedure Laterality Date   ABDOMINAL AORTAGRAM  03/25/2013   with Stent   ABDOMINAL AORTAGRAM N/A 03/25/2013   Procedure: ABDOMINAL Ronny Flurry;  Surgeon: Fransisco Hertz, MD;  Location: [facility] C[facility] LAB;  Service: Cardiovascular;  Laterality: N/A;   AORTOGRAM     CATARACT EXTRACTION Bilateral    CATARACT EXTRACTION, BILATERAL     CHOLECYSTECTOMY     ENDARTERECTOMY Right 06/26/2016   Procedure: ENDARTERECTOMY CAROTID RIGHT;  Surgeon: Fransisco Hertz, MD;  Location: [facility] OR;  Service: Vascular;  Laterality:  Right;   EYE SURGERY     IR GASTROSTOMY TUBE MOD SED  02/20/2021   KIDNEY STONE SURGERY     LOWER EXTREMITY ANGIOGRAM N/A 03/25/2013   Procedure: LOWER EXTREMITY ANGIOGRAM;  Surgeon: Fransisco Hertz, MD;  Location: [facility] CATH LAB;  Service: Cardiovascular;  Laterality: N/A;   PATCH ANGIOPLASTY Right 06/26/2016   Procedure: PATCH ANGIOPLASTY RIGHT CAROTID ARTERY;  Surgeon: Fransisco Hertz, MD;  Location: [facility] OR;  Service: Vascular;   Laterality: Right;   PERCUTANEOUS STENT INTERVENTION Right 03/25/2013   Procedure: PERCUTANEOUS STENT INTERVENTION;  Surgeon: Fransisco Hertz, MD;  Location: [facility] CATH LAB;  Service: Cardiovascular;  Laterality: Right;  icast stent to rt common iliac artery   PERIPHERAL VASCULAR CATHETERIZATION N/A 07/18/2016   Procedure: Abdominal Aortogram w/Lower Extremity;  Surgeon: Fransisco Hertz, MD;  Location: [facility] INVASIVE CV LAB;  Service: Cardiovascular;  Laterality: N/A;   PERIPHERAL VASCULAR CATHETERIZATION Right 07/18/2016   Procedure: Peripheral Vascular Balloon Angioplasty;  Surgeon: Fransisco Hertz, MD;  Location: [facility] INVASIVE CV LAB;  Service: Cardiovascular;  Laterality: Right;  Common Iliac   TRACHEOSTOMY TUBE PLACEMENT     YAG LASER APPLICATION Left 02/20/2015   Procedure: YAG LASER APPLICATION;  Surgeon: Susa Simmonds, MD;  Location: [facility] ORS;  Service: Ophthalmology;  Laterality: Left;    Social History  reports that he has been smoking cigars. He has never used smokeless tobacco. He reports previous alcohol use. He reports that he does not use drugs.  Allergies  Allergen Reactions   No Known Allergies     Family History  Problem Relation Age of Onset   Hypertension Father    Diabetes Father    Cancer Father    Prior to Admission medications   Medication Sig Start Date End Date Taking? Authorizing Provider  acetaminophen (TYLENOL) 325 MG tablet Place 2 tablets (650 mg total) into feeding tube every 6 (six) hours as needed for fever. 02/02/21   Minor, Vilinda Blanks, NP  ALPRAZolam Prudy Feeler) 0.5 MG tablet Take 0.5 mg by mouth daily.    [provider]  amLODipine (NORVASC) 10 MG tablet Place 1 tablet (10 mg total) into feeding tube daily. 02/03/21   Minor, Vilinda Blanks, NP  apixaban (ELIQUIS) 5 MG TABS tablet Place 1 tablet (5 mg total) into feeding tube 2 (two) times daily. 02/02/21   Minor, Vilinda Blanks, NP  aspirin 81 MG chewable tablet Place 1 tablet (81 mg total) into feeding tube daily.  02/03/21   Minor, Vilinda Blanks, NP  aspirin 81 MG tablet Take 81 mg by mouth daily.    [provider]  atorvastatin (LIPITOR) 40 MG tablet Take 40 mg by mouth daily.  03/22/15   [provider]  atorvastatin (LIPITOR) 40 MG tablet Place 1 tablet (40 mg total) into feeding tube daily. 02/03/21   Minor, Vilinda Blanks, NP  buPROPion (WELLBUTRIN SR) 150 MG 12 hr tablet Take 150 mg by mouth 2 (two) times daily.    [provider]  citalopram (CELEXA) 20 MG tablet Take 20 mg by mouth daily.  03/01/13   [provider]  clonazePAM (KLONOPIN) 0.5 MG tablet Place 1 tablet (0.5 mg total) into feeding tube at bedtime. 02/02/21   Minor, Vilinda Blanks, NP  clopidogrel (PLAVIX) 75 MG tablet Take 1 tablet (75 mg total) by mouth daily. 03/25/13   Fransisco Hertz, MD  etodolac (LODINE) 400 MG tablet Take 400 mg by mouth 2 (two) times daily.    [provider]  folic acid (FOLVITE) 1 MG tablet Place 1 tablet (1 mg total) into feeding tube daily. 02/03/21   Minor, Vilinda BlanksWilliam S, NP  ipratropium-albuterol (DUONEB) 0.5-2.5 (3) MG/3ML SOLN Take 3 mLs by nebulization every 6 (six) hours as needed. 02/02/21   Minor, Vilinda BlanksWilliam S, NP  levETIRAcetam (KEPPRA) 100 MG/ML solution Place 10 mLs (1,000 mg total) into feeding tube 2 (two) times daily. 02/02/21   Minor, Vilinda BlanksWilliam S, NP  lisinopril-hydrochlorothiazide (PRINZIDE,ZESTORETIC) 20-12.5 MG per tablet Take 1 tablet by mouth daily.     [provider]  lovastatin (MEVACOR) 20 MG tablet Take 20 mg by mouth at bedtime.    [provider]  Multiple Vitamin (MULTIVITAMIN WITH MINERALS) TABS tablet Place 1 tablet into feeding tube daily. 02/03/21   Minor, Vilinda BlanksWilliam S, NP  Multiple Vitamin (MULTIVITAMIN WITH MINERALS) TABS Take 1 tablet by mouth daily.    [provider]  Nutritional Supplements (FEEDING SUPPLEMENT, OSMOLITE 1.5 CAL,) LIQD Place 1,000 mLs into feeding tube continuous. 02/02/21   Minor, Vilinda BlanksWilliam S, NP  Nutritional Supplements  (FEEDING SUPPLEMENT, PROSOURCE TF,) liquid Place 45 mLs into feeding tube 2 (two) times daily. 02/02/21   Minor, Vilinda BlanksWilliam S, NP  pantoprazole sodium (PROTONIX) 40 mg/20 mL PACK Place 20 mLs (40 mg total) into feeding tube daily. 02/03/21   Minor, Vilinda BlanksWilliam S, NP  QUEtiapine (SEROQUEL) 100 MG tablet Place 1 tablet (100 mg total) into feeding tube 2 (two) times daily. 02/02/21   Minor, Vilinda BlanksWilliam S, NP  thiamine (B-1) 100 MG/ML injection Inject 1 mL (100 mg total) into the vein daily. 02/03/21   Minor, Vilinda BlanksWilliam S, NP  traMADol (ULTRAM) 50 MG tablet Take 1 tablet (50 mg total) by mouth 3 (three) times daily as needed for moderate pain. 06/27/16   Raymond Gurneyrinh, Kimberly A, PA-C  traZODone (DESYREL) 50 MG tablet Take 50 mg by mouth at bedtime.    [provider]  vitamin B-12 1000 MCG tablet Place 1 tablet (1,000 mcg total) into feeding tube daily. 02/03/21   Minor, Vilinda BlanksWilliam S, NP    Physical Exam: Vitals:   03/02/21 1656 03/02/21 1746 03/02/21 2015 03/02/21 2031  BP:  132/81 129/79   Pulse:  91 96   Resp:  18 16   Temp:  98.8 F (37.1 C) 99 F (37.2 C)   TempSrc:   Oral   SpO2:  94% 100% 100%  Weight: 61 kg     Height: 5\' 2"  (1.575 m)       Constitutional: Looks chronically ill.  NAD, calm, comfortable Eyes: PERRL, lids and conjunctivae normal ENMT: Mucous membranes are moist. Posterior pharynx clear of any exudate or lesions. Neck: Positive tracheostomy.  Normal, supple, no masses, no thyromegaly Respiratory: clear to auscultation bilaterally, no wheezing, no crackles. Normal respiratory effort. No accessory muscle use.  Cardiovascular: Regular rate and rhythm, no murmurs / rubs / gallops. No extremity edema. 2+ pedal pulses. No carotid bruits.  Abdomen: Positive gastrostomy.  No tenderness, no masses palpated. No hepatosplenomegaly. Bowel sounds positive.  Musculoskeletal: no clubbing / cyanosis.  Generalized weakness.  Shortened LLE with severe impairment of left hip range of motion. Skin: no  rashes, lesions, ulcers on very limited dermatological examination. Neurologic: CN 2-12 grossly intact. Sensation intact, DTR normal. Strength 5/5 in all 4.  Unable to evaluate gait. Psychiatric: Normal judgment and insight. Alert and oriented x 3. Normal mood.   Labs on Admission: I have personally reviewed following labs and imaging studies  CBC: Recent Labs  Lab 02/25/21 0324 02/28/21 0020 03/02/21 1743  WBC 5.8 7.1 11.9*  NEUTROABS  --   --  10.4*  HGB 10.3* 10.6* 10.9*  HCT 31.3* 31.8* 34.2*  MCV 95.1 94.4 98.6  PLT 167 191 252    Basic Metabolic Panel: Recent Labs  Lab 02/25/21 0324 02/28/21 0020 03/02/21 1743  NA 138 136 136  K 3.5 4.0 4.2  CL 104 103 99  CO2 GLUCOSE 130* 118* 114*  BUN 18 22 24*  CREATININE 1.05 1.08 1.14  CALCIUM 8.3* 8.3* 8.5*  MG  --  1.9  --     GFR: Estimated Creatinine Clearance: 45.9 mL/min (by C-G formula based on SCr of 1.14 mg/dL).  Liver Function Tests: Recent Labs  Lab 03/02/21 1743  AST 45*  ALT 69*  ALKPHOS 110  BILITOT 0.2*  PROT 7.0  ALBUMIN 3.0*   Radiological Exams on Admission: DG Chest 1 View  Result Date: 03/02/2021 CLINICAL DATA:  Cough and known left hip fracture EXAM: CHEST  1 VIEW COMPARISON:  02/23/2021 FINDINGS: Cardiac shadow is enlarged but stable. Tracheostomy tube is noted in satisfactory position. The lungs are clear bilaterally. No acute bony abnormality is seen. Gastrostomy catheter is noted within the stomach. IMPRESSION: No acute abnormality noted. Electronically Signed   By: Alcide Clever M.D.   On: 03/02/2021 19:20   CT Head Wo Contrast  Result Date: 03/02/2021 CLINICAL DATA:  Recent fall EXAM: CT HEAD WITHOUT CONTRAST TECHNIQUE: Contiguous axial images were obtained from the base of the skull through the vertex without intravenous contrast. COMPARISON:  01/28/2021 FINDINGS: Brain: Mild atrophic changes and chronic white matter ischemic changes are seen. Findings of prior PCA infarcts are  noted bilaterally stable in appearance from the prior exam. No acute hemorrhage or acute infarct is seen. Vascular: No hyperdense vessel or unexpected calcification. Skull: Normal. Negative for fracture or focal lesion. Sinuses/Orbits: Opacification of the right maxillary antrum is noted increased in the interval from the prior exam. Additionally fluid is noted within the right ethmoid and frontal sinuses chronic in nature. Other: None. IMPRESSION: Chronic atrophic and ischemic changes intracranially. Progressive sinus disease particularly in the right maxillary antrum. Electronically Signed   By: Alcide Clever M.D.   On: 03/02/2021 18:56   DG Hip Unilat With Pelvis 2-3 Views Left  Result Date: 03/02/2021 CLINICAL DATA:  Recent fall with left hip pain, initial encounter EXAM: DG HIP (WITH OR WITHOUT PELVIS) 2-3V LEFT COMPARISON:  None. FINDINGS: Pelvic ring is intact. Postsurgical changes are noted in the proximal right femur. Comminuted intratrochanteric fracture of the left femur is seen. Impaction is noted at the fracture site. No soft tissue abnormality is noted. IMPRESSION: Comminuted intratrochanteric fracture of the proximal left femur. Electronically Signed   By: Alcide Clever M.D.   On: 03/02/2021 19:17   DG Hip Unilat With Pelvis 2-3 Views Right  Result Date: 03/02/2021 CLINICAL DATA:  Recent fall with right hip pain, initial encounter EXAM: DG HIP (WITH OR WITHOUT PELVIS) 2-3V RIGHT COMPARISON:  None. FINDINGS: Postsurgical changes are noted in the proximal right femur with medullary rod and fixation screw traversing the femoral neck. Healed proximal right femoral fracture is noted. Visualized pelvic ring is unremarkable. No soft tissue abnormality is noted. IMPRESSION: Postsurgical change in the right hip without acute abnormality. Electronically Signed   By: Alcide Clever M.D.   On: 03/02/2021 19:19    01/24/2021 echocardiogram  challenging due to uncooperative patient.  IMPRESSIONS  1.  Left ventricular ejection fraction, by estimation, is 60 to 65%. The  left ventricle has normal function. The left ventricle has no regional  wall motion abnormalities. Left ventricular diastolic parameters were  normal.   2. Right ventricular systolic function is normal. The right ventricular  size is normal.   3. The mitral valve is normal in structure. No evidence of mitral valve  regurgitation. No evidence of mitral stenosis.   4. The aortic valve has an indeterminant number of cusps. Aortic valve  regurgitation is not visualized. No aortic stenosis is present.   5. The inferior vena cava is normal in size with greater than 50%  respiratory variability, suggesting right atrial pressure of 3 mmHg.   EKG: Independently reviewed.  Vent. rate 95 BPM PR interval 144 ms QRS duration 70 ms QT/QTcB 344/432 ms P-R-T axes 62 68 88 2520 Normal sinus rhythm Nonspecific T wave abnormality Abnormal ECG  Assessment/Plan Principal Problem:   Closed intertrochanteric fracture of hip, left, initial encounter (HCC) Admit to telemetry as/inpatient. Keep NPO. Buck's traction per protocol. Analgesics as needed. Muscle relaxant PRN. Antiemetics as needed. Consult nutritional services. Consult TOC team. Orthopedic surgery will see in AM.  Active Problems:   Hypertension Continue amlodipine 10 mg p.o. daily. Holding lisinopril and diuretic since last discharge.    Bilateral carotid artery disease/PAD (HCC) On atorvastatin and aspirin.    Seizure disorder (HCC) Continue Keppra 1000 mg p.o. twice daily.    Paroxysmal atrial fibrillation (HCC) CHA?DS?-VASc Score of at least 5. Apixaban being held in the preoperative setting. No medications seen for rate control.    Tracheostomy status (HCC) Care per respiratory therapy.    Normocytic anemia Stable. Monitor hematocrit and hemoglobin. Transfuse as needed.    Mild protein-calorie malnutrition (HCC) Consult nutritional  services. Protein supplementation.    Transaminitis Repeat hepatic function panel in AM. Further action depending on this result.    Hyperlipidemia Hold atorvastatin until LFTs rechecked.    DVT prophylaxis: On apixaban. Code Status:   Full code. Family Communication:   Disposition Plan:   Patient is from:  [facility].  Anticipated DC to:  [facility].  Anticipated DC date:  03/06/2019 or 03/06/2021.  Anticipated DC barriers: Clinical status and consultant sign off. Consults called:  Orthopedic surgery Teryl Lucy, MD). Admission status:  Inpatient/telemetry.   Severity of Illness:  High severity in the setting of acute left intertrochanteric fracture which will require surgical intervention and postoperative rehabilitation. Bobette Mo MD [facility]  How to contact the [facility] Attending or Consulting provider 7A - 7P or covering provider during after hours 7P -7A, for this patient?   Check the care team in [facility] and look for a) attending/consulting [facility] provider listed and b) the [facility] team listed Log into [email or web address] and use [facility]'s universal password to access. If you do not have the password, please contact the hospital operator. Locate the [facility] provider you are looking for under [facility] and page to a number that you can be directly reached. If you still have difficulty reaching the provider, please page the [facility] (Director on Call) for the Hospitalists listed on amion for assistance.  03/02/2021, 9:23 PM   This document was prepared using Dragon voice recognition software and may contain some unintended transcription errors.

## 2021-03-03 ENCOUNTER — Encounter (HOSPITAL_COMMUNITY): Payer: Self-pay

## 2021-03-03 ENCOUNTER — Inpatient Hospital Stay (HOSPITAL_COMMUNITY): Payer: Medicare Other | Admitting: Anesthesiology

## 2021-03-03 ENCOUNTER — Encounter (HOSPITAL_COMMUNITY)
Admission: EM | Disposition: A | Discharge status: Skilled Nursing Facility | Payer: Self-pay | Source: Skilled Nursing Facility | Attending: Internal Medicine

## 2021-03-03 ENCOUNTER — Inpatient Hospital Stay (HOSPITAL_COMMUNITY): Payer: Medicare Other

## 2021-03-03 DIAGNOSIS — I10 Essential (primary) hypertension: Secondary | ICD-10-CM

## 2021-03-03 DIAGNOSIS — R569 Unspecified convulsions: Secondary | ICD-10-CM

## 2021-03-03 DIAGNOSIS — Z930 Tracheostomy status: Secondary | ICD-10-CM

## 2021-03-03 DIAGNOSIS — I480 Paroxysmal atrial fibrillation: Secondary | ICD-10-CM

## 2021-03-03 HISTORY — PX: INTRAMEDULLARY (IM) NAIL INTERTROCHANTERIC: SHX5875

## 2021-03-03 LAB — CBC
HCT: 30.1 % — ABNORMAL LOW (ref 39.0–52.0)
Hemoglobin: 9.7 g/dL — ABNORMAL LOW (ref 13.0–17.0)
MCH: 31.3 pg (ref 26.0–34.0)
MCHC: 32.2 g/dL (ref 30.0–36.0)
MCV: 97.1 fL (ref 80.0–100.0)
Platelets: 231 10*3/uL (ref 150–400)
RBC: 3.1 MIL/uL — ABNORMAL LOW (ref 4.22–5.81)
RDW: 13.2 % (ref 11.5–15.5)
WBC: 11 10*3/uL — ABNORMAL HIGH (ref 4.0–10.5)
nRBC: 0 % (ref 0.0–0.2)

## 2021-03-03 LAB — COMPREHENSIVE METABOLIC PANEL
ALT: 60 U/L — ABNORMAL HIGH (ref 0–44)
AST: 36 U/L (ref 15–41)
Albumin: 2.8 g/dL — ABNORMAL LOW (ref 3.5–5.0)
Alkaline Phosphatase: 108 U/L (ref 38–126)
Anion gap: 5 (ref 5–15)
BUN: 20 mg/dL (ref 8–23)
CO2: 29 mmol/L (ref 22–32)
Calcium: 8.5 mg/dL — ABNORMAL LOW (ref 8.9–10.3)
Chloride: 102 mmol/L (ref 98–111)
Creatinine, Ser: 1.09 mg/dL (ref 0.61–1.24)
GFR, Estimated: >60 mL/min (ref >60)
Glucose, Bld: 113 mg/dL — ABNORMAL HIGH (ref 70–99)
Potassium: 4.1 mmol/L (ref 3.5–5.1)
Sodium: 136 mmol/L (ref 135–145)
Total Bilirubin: 0.5 mg/dL (ref 0.3–1.2)
Total Protein: 6.1 g/dL — ABNORMAL LOW (ref 6.5–8.1)

## 2021-03-03 LAB — SURGICAL PCR SCREEN
MRSA, PCR: NEGATIVE
Staphylococcus aureus: NEGATIVE

## 2021-03-03 SURGERY — Surgical Case
Anesthesia: *Unknown

## 2021-03-03 SURGERY — FIXATION, FRACTURE, INTERTROCHANTERIC, WITH INTRAMEDULLARY ROD
Anesthesia: Choice | Laterality: Left

## 2021-03-03 MED ORDER — LIDOCAINE HCL (CARDIAC) PF 100 MG/5ML IV SOSY
PREFILLED_SYRINGE | INTRAVENOUS | Status: DC | PRN
  Administered 2021-03-03: 60 mg via INTRATRACHEAL

## 2021-03-03 MED ORDER — LACTATED RINGERS IV SOLN: INTRAVENOUS | Status: DC | PRN

## 2021-03-03 MED ORDER — CEFAZOLIN SODIUM-DEXTROSE 2-4 GM/100ML-% IV SOLN
2.0000 g | Freq: Three times a day (TID) | INTRAVENOUS | Status: AC
  Administered 2021-03-03 – 2021-03-04 (×2): 2 g via INTRAVENOUS
  Filled 2021-03-03 (×2): qty 100

## 2021-03-03 MED ORDER — MIDAZOLAM HCL 2 MG/2ML IJ SOLN
INTRAMUSCULAR | Status: AC
  Filled 2021-03-03: qty 2

## 2021-03-03 MED ORDER — ORAL CARE MOUTH RINSE: 15.0000 mL | Freq: Once | OROMUCOSAL | Status: DC

## 2021-03-03 MED ORDER — FENTANYL CITRATE (PF) 250 MCG/5ML IJ SOLN
INTRAMUSCULAR | Status: AC
  Filled 2021-03-03: qty 5

## 2021-03-03 MED ORDER — PROPOFOL 10 MG/ML IV BOLUS
INTRAVENOUS | Status: DC | PRN
  Administered 2021-03-03: 160 mg via INTRAVENOUS

## 2021-03-03 MED ORDER — IPRATROPIUM-ALBUTEROL 0.5-2.5 (3) MG/3ML IN SOLN
3.0000 mL | Freq: Three times a day (TID) | RESPIRATORY_TRACT | Status: DC
  Administered 2021-03-03: 3 mL via RESPIRATORY_TRACT
  Filled 2021-03-03: qty 3

## 2021-03-03 MED ORDER — CHLORHEXIDINE GLUCONATE 0.12 % MT SOLN: 15.0000 mL | Freq: Once | OROMUCOSAL | Status: DC

## 2021-03-03 MED ORDER — ALBUTEROL SULFATE (2.5 MG/3ML) 0.083% IN NEBU
2.5000 mg | INHALATION_SOLUTION | Freq: Every day | RESPIRATORY_TRACT | Status: DC
  Administered 2021-03-04 – 2021-03-07 (×4): 2.5 mg via RESPIRATORY_TRACT
  Filled 2021-03-03 (×6): qty 3

## 2021-03-03 MED ORDER — LACTATED RINGERS IV SOLN: INTRAVENOUS | Status: DC

## 2021-03-03 MED ORDER — ONDANSETRON HCL 4 MG/2ML IJ SOLN
INTRAMUSCULAR | Status: DC | PRN
  Administered 2021-03-03: 4 mg via INTRAVENOUS

## 2021-03-03 MED ORDER — 0.9 % SODIUM CHLORIDE (POUR BTL) OPTIME
TOPICAL | Status: DC | PRN
  Administered 2021-03-03: 1000 mL

## 2021-03-03 MED ORDER — PROPOFOL 10 MG/ML IV BOLUS
INTRAVENOUS | Status: AC
  Filled 2021-03-03: qty 20

## 2021-03-03 MED ORDER — POTASSIUM CHLORIDE IN NACL 20-0.45 MEQ/L-% IV SOLN
INTRAVENOUS | Status: DC
  Filled 2021-03-03: qty 1000

## 2021-03-03 MED ORDER — IPRATROPIUM-ALBUTEROL 0.5-2.5 (3) MG/3ML IN SOLN: 3.0000 mL | Freq: Four times a day (QID) | RESPIRATORY_TRACT | Status: DC | PRN

## 2021-03-03 MED ORDER — FENTANYL CITRATE (PF) 250 MCG/5ML IJ SOLN
INTRAMUSCULAR | Status: DC | PRN
  Administered 2021-03-03 (×4): 50 ug via INTRAVENOUS

## 2021-03-03 MED ORDER — LEVETIRACETAM IN NACL 1000 MG/100ML IV SOLN
1000.0000 mg | Freq: Two times a day (BID) | INTRAVENOUS | Status: DC
  Administered 2021-03-03 – 2021-03-04 (×2): 1000 mg via INTRAVENOUS
  Filled 2021-03-03 (×3): qty 100

## 2021-03-03 SURGICAL SUPPLY — 43 items
BENZOIN TINCTURE PRP APPL 2/3 (GAUZE/BANDAGES/DRESSINGS) ×2 IMPLANT
BIT DRILL CANN LG 4.3MM (BIT) ×1 IMPLANT
BOOTCOVER CLEANROOM LRG (PROTECTIVE WEAR) ×4 IMPLANT
CLSR STERI-STRIP ANTIMIC 1/2X4 (GAUZE/BANDAGES/DRESSINGS) ×2 IMPLANT
COVER PERINEAL POST (MISCELLANEOUS) ×2 IMPLANT
COVER SURGICAL LIGHT HANDLE (MISCELLANEOUS) ×2 IMPLANT
COVER WAND RF STERILE (DRAPES) ×2 IMPLANT
DRAPE STERI IOBAN 125X83 (DRAPES) ×2 IMPLANT
DRILL BIT CANN LG 4.3MM (BIT) ×2
DRSG MEPILEX BORDER 4X4 (GAUZE/BANDAGES/DRESSINGS) ×4 IMPLANT
DRSG MEPILEX BORDER 4X8 (GAUZE/BANDAGES/DRESSINGS) ×4 IMPLANT
DURAPREP 26ML APPLICATOR (WOUND CARE) ×2 IMPLANT
ELECT CAUTERY BLADE 6.4 (BLADE) ×2 IMPLANT
ELECT REM PT RETURN 9FT ADLT (ELECTROSURGICAL) ×2
ELECTRODE REM PT RTRN 9FT ADLT (ELECTROSURGICAL) ×1 IMPLANT
FACESHIELD WRAPAROUND (MASK) ×4 IMPLANT
GAUZE XEROFORM 5X9 LF (GAUZE/BANDAGES/DRESSINGS) ×2 IMPLANT
GLOVE BIO SURGEON STRL SZ7 (GLOVE) ×4 IMPLANT
GLOVE ORTHO TXT STRL SZ7.5 (GLOVE) ×4 IMPLANT
GLOVE SURG UNDER POLY LF SZ7 (GLOVE) ×2 IMPLANT
GOWN STRL REUS W/ TWL LRG LVL3 (GOWN DISPOSABLE) ×2 IMPLANT
GOWN STRL REUS W/TWL LRG LVL3 (GOWN DISPOSABLE) ×4
GUIDEPIN 3.2X17.5 THRD DISP (PIN) ×2 IMPLANT
GUIDEWIRE BALL NOSE 100CM (WIRE) ×2 IMPLANT
HIP FRA NAIL LAG SCREW 10.5X90 (Orthopedic Implant) ×2 IMPLANT
KIT TURNOVER KIT B (KITS) ×2 IMPLANT
MANIFOLD NEPTUNE II (INSTRUMENTS) ×2 IMPLANT
NAIL HIP FRACT 130D 11X180 (Screw) ×2 IMPLANT
NS IRRIG 1000ML POUR BTL (IV SOLUTION) ×2 IMPLANT
PACK GENERAL/GYN (CUSTOM PROCEDURE TRAY) ×2 IMPLANT
PAD ARMBOARD 7.5X6 YLW CONV (MISCELLANEOUS) ×4 IMPLANT
SCREW BONE CORTICAL 5.0X36 (Screw) ×2 IMPLANT
SCREW LAG HIP FRA NAIL 10.5X90 (Orthopedic Implant) ×1 IMPLANT
SUT VIC AB 0 CT1 27 (SUTURE) ×4
SUT VIC AB 0 CT1 27XBRD ANBCTR (SUTURE) ×2 IMPLANT
SUT VIC AB 2-0 CT1 27 (SUTURE) ×2
SUT VIC AB 2-0 CT1 TAPERPNT 27 (SUTURE) ×1 IMPLANT
SUT VIC AB 3-0 CT1 27 (SUTURE) ×2
SUT VIC AB 3-0 CT1 TAPERPNT 27 (SUTURE) ×1 IMPLANT
SUT VIC AB 3-0 SH 8-18 (SUTURE) ×2 IMPLANT
TOWEL GREEN STERILE (TOWEL DISPOSABLE) ×2 IMPLANT
TOWEL GREEN STERILE FF (TOWEL DISPOSABLE) ×2 IMPLANT
WATER STERILE IRR 1000ML POUR (IV SOLUTION) ×2 IMPLANT

## 2021-03-03 NOTE — Progress Notes (Signed)
Telephone consent received and witnessed by 2 nurses for surgery of the left hip by his brother Frankie Scipio [phone]. Per Brett Canales only the people who know the password "[email or web address]" can receive information or given consent.

## 2021-03-03 NOTE — Progress Notes (Signed)
PROGRESS NOTE  Adam Mcclain MVH:[id number] DOB: June 29, 1951 DOA: 03/02/2021 PCP: Pcp, No  Brief History:  70 year old male with a history of paroxysmal atrial fibrillation on apixaban, stroke, chronic respiratory failure with tracheostomy, hypertension, peripheral artery disease, COPD, lipidemia presenting after mechanical fall at his nursing facility, [facility].  Apparently, the patient was trying to get out of bed when he lost his balance and fell on his left side.  He denied hitting his head or losing consciousness.  Patient states that he was able to get himself up off the floor and sit on the bed but endorsed bilateral hip pain, left greater than right and was unable to bear weight.  Apparently, an x-ray was performed at the facility and revealed a left femur fracture.  EMS was activated, the patient was transferred to emergency department.  The patient himself denies any fevers, chills, chest pain, fast breath, nausea, vomiting, diarrhea, domino pain.  In the emergency department, x-rays revealed a left proximal femur comminuted fracture.  Orthopedics requested transfer to [facility] for further treatment.  Notably, the patient had a prolonged hospitalization from 01/19/2021 to 02/02/2021 when he was found unresponsive secondary to seizures.  His hospitalization was prolonged secondary to requiring reintubation x3.  On the third episode, the patient had self extubated on 01/26/2021 and was subsequently reintubated on 01/27/2021.  He underwent tracheostomy on 01/29/2021.  [facility]   Apparently, the patient failed swallowing evaluation and underwent gastrostomy tube placement on 02/19/2021. Subsequently, the patient was discharged to [facility] on 03/01/2021.  ED Course: Initial vital signs were temperature 98.8 F, pulse 91, respiration 18, BP 132/81 mmHg O2 sat 94% on tracheostomy collar.   Lab work: CBC showed a white count  11.9, hemoglobin 10.9 g/dL and platelets 841.  CMP had normal electrolytes when was calcium corrected to an albumin 3.0 g/dL.  Glucose was 114, BUN 24 and total bilirubin 0.2 mg/dL.  AST was 45 and ALT 69 units/L.   Imaging: A portable 1 view chest radiograph did not show any acute abnormality.  CT head without contrast showed chronic atrophy and ischemic changes intracranially.  There is progressive sinus disease particularly in the right maxillary antrum.  Left hip x-rays show comminuted right intertrochanteric fracture of the proximal left femur.  Right hip x-ray shows poor surgical change in the right hip without acute abnormality.  Assessment/Plan: Left comminuted proximal femur fracture -Dr. Dion Saucier will eval after transfer to [facility] -remain npo -PT/OT after surgery -judicious opioids  Paroxysmal atrial fibrillation -Currently in sinus rhythm -Holding apixaban in preparation for surgery -Personally reviewed EKG--sinus rhythm, nonspecific T wave changes  Chronic respiratory failure with hypoxia/COPD -Patient has tracheostomy with trach collar oxygenation -Stable presently on FiO2 28% TC -Patient has been following with pulmonary for trach capping trials -Restart bronchodilators  FEN -Remain n.p.o. for now -Patient was receiving Osmolite 1.5 @ 40cc/hr at SNF -Patient was receiving free water 60 cc every 4 hours -Resume after surgery -03/03/2021 BMP shows optimized electrolytes  Seizure disorder -Continue Keppra 500 mg twice daily  Essential hypertension -Holding amlodipine and lisinopril/HCTZ temporarily due to soft blood pressure -Patient developed relative hypotension after morphine  Depression/anxiety -Resume Seroquel, Klonopin post op  Carotid stenosis -At the time of his last hospital admission in May 2022, the patient was noted to have 80% right CCA stenosis -deemed unstable for intervention -resume apixaban when ok with ortho  Hyperlipidemia -Resume statin post  op       Status is: Inpatient  Remains inpatient appropriate because:Inpatient level of care appropriate due to severity of illness  Dispo: The patient is from: SNF              Anticipated d/c is to: SNF              Patient currently is not medically stable to d/c.   Difficult to place patient No        Family Communication:   no Family at bedside  Consultants:  ortho  Code Status:  DNR  DVT Prophylaxis:  apixaban   Procedures: As Listed in Progress Note Above  Antibiotics: None      Subjective: Patient denies fevers, chills, headache, chest pain, dyspnea, nausea, vomiting, diarrhea, abdominal pain, dysuria,   Objective: Vitals:   03/03/21 0415 03/03/21 0500 03/03/21 0600 03/03/21 0700  BP:  (!) 141/83 114/62 (!) 150/83  Pulse: 91 86 80   Resp: 18 19 18 16   Temp:      TempSrc:      SpO2: 100% 98% 98%   Weight:      Height:       No intake or output data in the 24 hours ending 03/03/21 0734 Weight change:  Exam:  General:  Pt is alert, follows commands appropriately, not in acute distress HEENT: No icterus, No thrush, No neck mass, [place]/AT Cardiovascular: RRR, S1/S2, no rubs, no gallops Respiratory: bilateral scattered rales Abdomen: Soft/+BS, non tender, non distended, no guarding Extremities: No edema, No lymphangitis, No petechiae, No rashes, no synovitis   Data Reviewed: I have personally reviewed following labs and imaging studies Basic Metabolic Panel: Recent Labs  Lab 02/25/21 0324 02/28/21 0020 03/02/21 1743 03/03/21 0502  NA 138 136 136 136  K 3.5 4.0 4.2 4.1  CL 104 103 99 102  CO2 29 26 28 29   GLUCOSE 130* 118* 114* 113*  BUN 18 22 24* 20  CREATININE 1.05 1.08 1.14 1.09  CALCIUM 8.3* 8.3* 8.5* 8.5*  MG  --  1.9  --   --    Liver Function Tests: Recent Labs  Lab 03/02/21 1743 03/03/21 0502  AST 45* 36  ALT 69* 60*  ALKPHOS 110 108  BILITOT 0.2* 0.5  PROT 7.0 6.1*  ALBUMIN 3.0* 2.8*   No results for input(s):  LIPASE, AMYLASE in the last 168 hours. No results for input(s): AMMONIA in the last 168 hours. Coagulation Profile: Recent Labs  Lab 03/02/21 2112  INR 1.6*   CBC: Recent Labs  Lab 02/25/21 0324 02/28/21 0020 03/02/21 1743 03/03/21 0502  WBC 5.8 7.1 11.9* 11.0*  NEUTROABS  --   --  10.4*  --   HGB 10.3* 10.6* 10.9* 9.7*  HCT 31.3* 31.8* 34.2* 30.1*  MCV 95.1 94.4 98.6 97.1  PLT 167 191 252 231   Cardiac Enzymes: No results for input(s): CKTOTAL, CKMB, CKMBINDEX, TROPONINI in the last 168 hours. BNP: Invalid input(s): POCBNP CBG: No results for input(s): GLUCAP in the last 168 hours. HbA1C: No results for input(s): HGBA1C in the last 72 hours. Urine analysis:    Component Value Date/Time   COLORURINE STRAW (A) 01/19/2021 1210   APPEARANCEUR CLEAR 01/19/2021 1210   LABSPEC 1.008 01/19/2021 1210   PHURINE 5.0 01/19/2021 1210   GLUCOSEU NEGATIVE 01/19/2021 1210   HGBUR MODERATE (A) 01/19/2021 1210   BILIRUBINUR NEGATIVE 01/19/2021 1210   KETONESUR NEGATIVE 01/19/2021 1210   PROTEINUR 100 (A)  01/19/2021 1210   NITRITE NEGATIVE 01/19/2021 1210   LEUKOCYTESUR NEGATIVE 01/19/2021 1210   Sepsis Labs: @LABRCNTIP (procalcitonin:4,lacticidven:4) ) Recent Results (from the past 240 hour(s))  Culture, Respiratory w Gram Stain     Status: None   Collection Time: 02/23/21 10:15 AM   Specimen: Tracheal Aspirate; Respiratory  Result Value Ref Range Status   Specimen Description TRACHEAL ASPIRATE  Final   Special Requests NONE  Final   Gram Stain   Final    MODERATE WBC PRESENT,BOTH PMN AND MONONUCLEAR MODERATE GRAM POSITIVE COCCI IN PAIRS MODERATE GRAM NEGATIVE RODS RARE GRAM POSITIVE RODS FEW SQUAMOUS EPITHELIAL CELLS PRESENT Performed at [facility], [address] [place], [place]    Culture RARE PSEUDOMONAS AERUGINOSA  Final   Report Status 02/28/2021 FINAL  Final   Organism ID, Bacteria PSEUDOMONAS AERUGINOSA  Final      Susceptibility    Pseudomonas aeruginosa - MIC*    CEFTAZIDIME 4 SENSITIVE Sensitive     CIPROFLOXACIN <=0.25 SENSITIVE Sensitive     GENTAMICIN <=1 SENSITIVE Sensitive     IMIPENEM 1 SENSITIVE Sensitive     PIP/TAZO 8 SENSITIVE Sensitive     CEFEPIME 2 SENSITIVE Sensitive     * RARE PSEUDOMONAS AERUGINOSA  SARS CORONAVIRUS 2 (Lorenza Winkleman 6-24 HRS) Nasopharyngeal Nasopharyngeal Swab     Status: None   Collection Time: 02/28/21 11:25 AM   Specimen: Nasopharyngeal Swab  Result Value Ref Range Status   SARS Coronavirus 2 NEGATIVE NEGATIVE Final    Comment: (NOTE) SARS-CoV-2 target nucleic acids are NOT DETECTED.  The SARS-CoV-2 RNA is generally detectable in upper and lower respiratory specimens during the acute phase of infection. Negative results do not preclude SARS-CoV-2 infection, do not rule out co-infections with other pathogens, and should not be used as the sole basis for treatment or other patient management decisions. Negative results must be combined with clinical observations, patient history, and epidemiological information. The expected result is Negative.  Fact Sheet for Patients: 03/02/21  Fact Sheet for Healthcare Providers: HairSlick.no  This test is not yet approved or cleared by the quierodirigir.com FDA and  has been authorized for detection and/or diagnosis of SARS-CoV-2 by FDA under an Emergency Use Authorization (EUA). This EUA will remain  in effect (meaning this test can be used) for the duration of the COVID-19 declaration under Se ction 564(b)(1) of the Act, 21 U.S.C. section 360bbb-3(b)(1), unless the authorization is terminated or revoked sooner.  Performed at [facility], [address] [place], [place]   Resp Panel by RT-PCR (Flu A&B, Covid) Nasopharyngeal Swab     Status: None   Collection Time: 03/02/21  9:00 PM   Specimen: Nasopharyngeal Swab; Nasopharyngeal(NP) swabs in vial transport  medium  Result Value Ref Range Status   SARS Coronavirus 2 by RT PCR NEGATIVE NEGATIVE Final    Comment: (NOTE) SARS-CoV-2 target nucleic acids are NOT DETECTED.  The SARS-CoV-2 RNA is generally detectable in upper respiratory specimens during the acute phase of infection. The lowest concentration of SARS-CoV-2 viral copies this assay can detect is 138 copies/mL. A negative result does not preclude SARS-Cov-2 infection and should not be used as the sole basis for treatment or other patient management decisions. A negative result may occur with  improper specimen collection/handling, submission of specimen other than nasopharyngeal swab, presence of viral mutation(s) within the areas targeted by this assay, and inadequate number of viral copies(<138 copies/mL). A negative result must be combined with clinical  observations, patient history, and epidemiological information. The expected result is Negative.  Fact Sheet for Patients:  BloggerCourse.com  Fact Sheet for Healthcare Providers:  SeriousBroker.it  This test is no t yet approved or cleared by the [place] FDA and  has been authorized for detection and/or diagnosis of SARS-CoV-2 by FDA under an Emergency Use Authorization (EUA). This EUA will remain  in effect (meaning this test can be used) for the duration of the COVID-19 declaration under Section 564(b)(1) of the Act, 21 U.S.C.section 360bbb-3(b)(1), unless the authorization is terminated  or revoked sooner.       Influenza A by PCR NEGATIVE NEGATIVE Final   Influenza B by PCR NEGATIVE NEGATIVE Final    Comment: (NOTE) The Xpert Xpress SARS-CoV-2/FLU/RSV plus assay is intended as an aid in the diagnosis of influenza from Nasopharyngeal swab specimens and should not be used as a sole basis for treatment. Nasal washings and aspirates are unacceptable for Xpert Xpress SARS-CoV-2/FLU/RSV testing.  Fact Sheet for  Patients: BloggerCourse.com  Fact Sheet for Healthcare Providers: SeriousBroker.it  This test is not yet approved or cleared by the [place] FDA and has been authorized for detection and/or diagnosis of SARS-CoV-2 by FDA under an Emergency Use Authorization (EUA). This EUA will remain in effect (meaning this test can be used) for the duration of the COVID-19 declaration under Section 564(b)(1) of the Act, 21 U.S.C. section 360bbb-3(b)(1), unless the authorization is terminated or revoked.  Performed at [facility], [address] [place], [place] [address]      Scheduled Meds:  acetaminophen  1,000 mg Oral Once   chlorhexidine  60 mL Topical Once   levETIRAcetam  1,000 mg Per Tube BID   povidone-iodine  2 application Topical Once   Continuous Infusions:   ceFAZolin (ANCEF) IV      Procedures/Studies: CT ABDOMEN WO CONTRAST  Result Date: 02/14/2021 CLINICAL DATA:  Evaluate anatomy for percutaneous gastrostomy tube placement. EXAM: CT ABDOMEN WITHOUT CONTRAST TECHNIQUE: Multidetector CT imaging of the abdomen was performed following the standard protocol without IV contrast. COMPARISON:  None. FINDINGS: Lower chest: Dependent densities at both lung bases that could represent atelectasis. No large pleural effusions. Possible 6 mm nodule in the posterior left lower lobe on sequence 5, image 15. Coronary artery calcifications. Hepatobiliary: Numerous hypodensities throughout the liver. These hypodensities are water attenuation based on the Hounsfield units and most compatible with numerous hepatic cysts. Index cyst at the left hepatic dome measures up to 3.7 cm. Cholecystectomy clips. Pancreas: Unremarkable. No pancreatic ductal dilatation or surrounding inflammatory changes. Spleen: Normal in size without focal abnormality. Adrenals/Urinary Tract: Normal appearance of the adrenal glands. Exophytic low-density structure in left kidney  lower pole measures up to 3.1 cm and suggestive for a cyst based on the Hounsfield units. No hydronephrosis. Stomach/Bowel: There is a nasogastric feeding tube with the tip near the pylorus. Small amount of high-density contrast in the stomach. Focal collection of oral contrast in the precaval region and this is likely associated with a duodenal diverticulum. Additional contrast in small bowel loops. No significant bowel dilatation. The stomach is cephalad to the transverse colon and no bowel structures positioned between the stomach and the anterior abdominal wall. Small hiatal hernia. Vascular/Lymphatic: Aorta is heavily calcified and measures up to 2.8 cm. There is short stent within the right common iliac artery. No significant lymph node enlargement in the abdomen. Other: Negative for ascites.  Negative for free air. Musculoskeletal: No acute bone abnormality. IMPRESSION: 1. The anatomy  should be amendable for a percutaneous gastrostomy tube. There is no bowel located between the stomach and anterior abdominal wall. Please note there is evidence for a small hiatal hernia. 2. Focal collection of oral contrast in the precaval region. Suspect this is associated with a duodenal diverticulum. 3. Indeterminate 6 mm nodule at the left lung base. Non-contrast chest CT at 6-12 months is recommended. If the nodule is stable at time of repeat CT, then future CT at 18-24 months (from today's scan) is considered optional for low-risk patients, but is recommended for high-risk patients. This recommendation follows the consensus statement: Guidelines for Management of Incidental Pulmonary Nodules Detected on CT Images: From the Fleischner Society 2017; Radiology 2017; 284:228-243. 4. Hepatic cysts.  Left renal cyst. 5.  Aortic Atherosclerosis (ICD10-I70.0). Electronically Signed   By: Richarda Overlie M.D.   On: 02/14/2021 16:45   DG Chest 1 View  Result Date: 03/02/2021 CLINICAL DATA:  Cough and known left hip fracture EXAM:  CHEST  1 VIEW COMPARISON:  02/23/2021 FINDINGS: Cardiac shadow is enlarged but stable. Tracheostomy tube is noted in satisfactory position. The lungs are clear bilaterally. No acute bony abnormality is seen. Gastrostomy catheter is noted within the stomach. IMPRESSION: No acute abnormality noted. Electronically Signed   By: Alcide Clever M.D.   On: 03/02/2021 19:20   DG Abd 1 View  Result Date: 02/10/2021 CLINICAL DATA:  Possible constipation EXAM: ABDOMEN - 1 VIEW COMPARISON:  02/03/2021 FINDINGS: Feeding catheter is again noted in the distal stomach. Scattered large and small bowel gas is noted. Mild retained fecal material is noted in the right colon although no constipation is noted. IMPRESSION: Feeding catheter in the distal stomach. No other focal abnormality is noted. Electronically Signed   By: Alcide Clever M.D.   On: 02/10/2021 18:48   CT Head Wo Contrast  Result Date: 03/02/2021 CLINICAL DATA:  Recent fall EXAM: CT HEAD WITHOUT CONTRAST TECHNIQUE: Contiguous axial images were obtained from the base of the skull through the vertex without intravenous contrast. COMPARISON:  01/28/2021 FINDINGS: Brain: Mild atrophic changes and chronic white matter ischemic changes are seen. 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 No acute hemorrhage or acute infarct is seen. Vascular: No hyperdense vessel or unexpected calcification. Skull: Normal. Negative for fracture or focal lesion. Sinuses/Orbits: Opacification of the right maxillary antrum is noted increased in the interval from the prior exam. Additionally fluid is noted within the right ethmoid and frontal sinuses chronic in nature. Other: None. IMPRESSION: Chronic atrophic and ischemic changes intracranially. Progressive sinus disease particularly in the right maxillary antrum. Electronically Signed   By: Alcide Clever M.D.   On: 03/02/2021 18:56   IR GASTROSTOMY TUBE MOD SED  Result Date:  02/21/2021 CLINICAL DATA:  Chronic respiratory failure with tracheostomy, needs enteral feeding support EXAM: PERC PLACEMENT GASTROSTOMY FLUOROSCOPY TIME:  90 seconds; 4 mGy TECHNIQUE: The procedure, risks, benefits, and alternatives were explained to the patient and family. Questions regarding the procedure were encouraged and answered. The family understands and consents to the procedure. As antibiotic prophylaxis, cefazolin 2 g was ordered pre-procedure and administered intravenously within one hour of incision.Progression of previously administered oral barium into the colon was confirmed fluoroscopically. A 5 French angiographic catheter was placed as orogastric tube. The upper abdomen was prepped with Betadine, draped in usual sterile fashion, and infiltrated locally with 1% lidocaine. Intravenous Fentanyl and Versed  were administered as conscious sedation during continuous monitoring  of the patient's level of consciousness and physiological / cardiorespiratory status by the radiology RN, with a total moderate sedation time of 11 minutes. Stomach was insufflated using air through the orogastric tube. An 6818 French sheath needle was advanced percutaneously into the gastric lumen under fluoroscopy. Gas could be aspirated and a small contrast injection confirmed intraluminal spread. The sheath was exchanged over a guidewire for a 9 [place]French vascular sheath, through which the snare device was advanced and used to snare a guidewire passed through the orogastric tube. This was withdrawn, and the snare attached to the 20 French pull-through gastrostomy tube, which was advanced antegrade, positioned with the internal bumper securing the anterior gastric wall to the anterior abdominal wall. Small contrast injection confirms appropriate positioning. The external bumper was applied and the catheter was flushed. COMPLICATIONS: COMPLICATIONS none IMPRESSION: 1. Technically successful 20 French pull-through  gastrostomy placement under fluoroscopy. Electronically Signed   By: Corlis Leak  Hassell M.D.   On: 02/21/2021 07:59   DG CHEST PORT 1 VIEW  Result Date: 02/23/2021 CLINICAL DATA:  Abnormal white blood cell count EXAM: PORTABLE CHEST 1 VIEW COMPARISON:  02/03/2021 FINDINGS: Tracheostomy tube remains in place. Feeding tube has been removed in the interval with partially visualized gastrostomy tube at the left upper quadrant. Stable cardiomediastinal contours. No focal airspace consolidation, pleural effusion, or pneumothorax. IMPRESSION: No active disease. Electronically Signed   By: Duanne GuessNicholas  Plundo D.O.   On: 02/23/2021 12:01   DG Chest Port 1 View  Result Date: 02/03/2021 CLINICAL DATA:  Shortness of breath EXAM: PORTABLE CHEST 1 VIEW COMPARISON:  01/29/2021 FINDINGS: Tracheostomy and feeding tube remain in place, unchanged. No confluent airspace opacities or effusions. Heart is normal size. No visible effusions. No acute bony abnormality. IMPRESSION: No active disease. Electronically Signed   By: Charlett NoseKevin  Dover M.D.   On: 02/03/2021 15:43   DG Abd Portable 1V  Result Date: 02/03/2021 CLINICAL DATA:  70 year old male enteric tube placement. EXAM: PORTABLE ABDOMEN - 1 VIEW COMPARISON:  01/25/2021. FINDINGS: Portable AP supine view at 0616 hours. NG type tube has been removed and enteric feeding tube is in place with tip at the level of the gastric antrum just to the right of midline. Mild gaseous distension of the stomach now. Other bowel gas pattern stable and within normal limits. Stable cholecystectomy clips. Stable lung bases. No acute osseous abnormality identified. IMPRESSION: Enteric feeding tube tip at the distal stomach. Advance 6 cm if post pyloric transit is desired. Electronically Signed   By: Odessa FlemingH  Hall M.D.   On: 02/03/2021 07:06   DG Hip Unilat With Pelvis 2-3 Views Left  Result Date: 03/02/2021 CLINICAL DATA:  Recent fall with left hip pain, initial encounter EXAM: DG HIP (WITH OR WITHOUT PELVIS)  2-3V LEFT COMPARISON:  None. FINDINGS: Pelvic ring is intact. Postsurgical changes are noted in the proximal right femur. Comminuted intratrochanteric fracture of the left femur is seen. Impaction is noted at the fracture site. No soft tissue abnormality is noted. IMPRESSION: Comminuted intratrochanteric fracture of the proximal left femur. Electronically Signed   By: Alcide CleverMark  Lukens M.D.   On: 03/02/2021 19:17   DG Hip Unilat With Pelvis 2-3 Views Right  Result Date: 03/02/2021 CLINICAL DATA:  Recent fall with right hip pain, initial encounter EXAM: DG HIP (WITH OR WITHOUT PELVIS) 2-3V RIGHT COMPARISON:  None. FINDINGS: Postsurgical changes are noted in the proximal right femur with medullary rod and fixation screw traversing the femoral neck. Healed proximal right femoral fracture is noted.  Visualized pelvic ring is unremarkable. No soft tissue abnormality is noted. IMPRESSION: Postsurgical change in the right hip without acute abnormality. Electronically Signed   By: Alcide Clever M.D.   On: 03/02/2021 19:19    Catarina Hartshorn, DO  [facility]  If 7PM-7AM, please contact night-coverage [email or web address] Password [facility] 03/03/2021, 7:34 AM   LOS: 1 day

## 2021-03-03 NOTE — Progress Notes (Signed)
Attempted report x 3.  

## 2021-03-03 NOTE — Transfer of Care (Signed)
Immediate Anesthesia Transfer of Care Note  Patient: Adam Mcclain  Procedure(s) Performed: INTRAMEDULLARY (IM) NAIL INTERTROCHANTRIC (Left)  Patient Location: PACU  Anesthesia Type:General  Level of Consciousness: drowsy  Airway & Oxygen Therapy: Patient Spontanous Breathing and Patient connected to tracheostomy mask oxygen  Post-op Assessment: Report given to RN and Post -op Vital signs reviewed and stable  Post vital signs: Reviewed and stable  Last Vitals:  Vitals Value Taken Time  BP 123/102 03/03/21 1711  Temp    Pulse 88 03/03/21 1714  Resp 24 03/03/21 1714  SpO2 100 % 03/03/21 1714  Vitals shown include unvalidated device data.  Last Pain:  Vitals:   03/03/21 1211  TempSrc: Oral  PainSc:          Complications: No notable events documented.

## 2021-03-03 NOTE — Anesthesia Postprocedure Evaluation (Signed)
Anesthesia Post Note  Patient: Adam Mcclain  Procedure(s) Performed: INTRAMEDULLARY (IM) NAIL INTERTROCHANTRIC (Left)     Patient location during evaluation: PACU Anesthesia Type: General Level of consciousness: awake and alert Pain management: pain level controlled Vital Signs Assessment: post-procedure vital signs reviewed and stable Respiratory status: spontaneous breathing, nonlabored ventilation, respiratory function stable and patient connected to nasal cannula oxygen Cardiovascular status: blood pressure returned to baseline and stable Postop Assessment: no apparent nausea or vomiting Anesthetic complications: no   No notable events documented.  Last Vitals:  Vitals:   03/03/21 1813 03/03/21 1849  BP: 107/62 134/69  Pulse: 77 89  Resp: 18 18  Temp: 36.6 C 36.5 C  SpO2: 100% 100%    Last Pain:  Vitals:   03/03/21 1849  TempSrc: Oral  PainSc:                  Alease Fait COKER

## 2021-03-03 NOTE — Progress Notes (Signed)
Admitted from PACU post left hip surgery, alert but confused, on T- collar at 28% O2, Dressing to left hip intact, no bleeding noted, Peg tube noted . VSS. Will continue to monitor patient.

## 2021-03-03 NOTE — Anesthesia Preprocedure Evaluation (Signed)
Anesthesia Evaluation  Patient identified by MRN, date of birth, ID band Patient awake    Reviewed: Allergy & Precautions, NPO status , Patient's Chart, lab work & pertinent test results  Airway Mallampati: Trach       Dental  (+) Teeth Intact   Pulmonary Current Smoker,    + rhonchi        Cardiovascular hypertension,  Rhythm:Regular Rate:Normal     Neuro/Psych    GI/Hepatic   Endo/Other    Renal/GU      Musculoskeletal   Abdominal   Peds  Hematology   Anesthesia Other Findings   Reproductive/Obstetrics                             Anesthesia Physical Anesthesia Plan  ASA: 3  Anesthesia Plan: General   Post-op Pain Management:    Induction: Intravenous  PONV Risk Score and Plan: Ondansetron and Dexamethasone  Airway Management Planned: Tracheostomy  Additional Equipment:   Intra-op Plan:   Post-operative Plan:   Informed Consent: I have reviewed the patients History and Physical, chart, labs and discussed the procedure including the risks, benefits and alternatives for the proposed anesthesia with the patient or authorized representative who has indicated his/her understanding and acceptance.       Plan Discussed with: CRNA and Anesthesiologist  Anesthesia Plan Comments:         Anesthesia Quick Evaluation

## 2021-03-03 NOTE — Progress Notes (Signed)
Initial Nutrition Assessment  DOCUMENTATION CODES:   Not applicable  INTERVENTION:   Once tube feeds resumed, recommend:  Osmolite 1.5 @ 64ml/hr + ProSource TF 96ml daily via tube   Free water flushes q4 hours  Regimen provides 2020kcal/day, 94g/day protein and 1624ml/day free water   NUTRITION DIAGNOSIS:   Inadequate oral intake related to dysphagia as evidenced by NPO status (pt with chronic G-tube).  GOAL:   Patient will meet greater than or equal to 90% of their needs  MONITOR:   Labs, Weight trends, Skin, I & O's, TF tolerance  REASON FOR ASSESSMENT:   Consult Assessment of nutrition requirement/status  ASSESSMENT:   70 y/o male with h/o seizures, Afib, HTN, PVD, stroke, COPD, GERD and prolonged hospitalization from 01/19/2021 to 02/02/2021 for seizures complicated by prolonged hospitalization requiring reintubation x3, tracheostomy on 01/29/2021, failed swallowing evaluation and IR gastrostomy tube placement on 02/20/2021 who was discharged to [facility] on 03/01/2021 and now returns with hip fracture after fall.  RD working remotely.  Unable to reach pt by phone. Spoke with RN at [facility]. Pt NPO at facility and receives 100% of his nutrition via G-tube. Pt's home tube feed regimen is Osmolite 1.5@40ml /hr x 24 hrs + ProStat 35ml daily via tube + free water flushes of 63ml q 4 hours. Regimen provides 1540kcal/day, 75g/day protein and 1063ml/day free water. Per chart, pt appears weight stable pta. Pt currently NPO for hip fracture repair today. RD will increase estimated needs to support post-op healing. Pt previously diagnosed with severe malnutrition. RD will obtain nutrition related history and exam at follow-up.    Medications reviewed and include: LRS @10ml /hr, hydromorphone, oxycodone  Labs reviewed: K 4.1 wnl wbc- 11.0(H),Hgb 9.7(L), Hct 30.1(L)  NUTRITION - FOCUSED PHYSICAL EXAM: Unable to perform at this time   Diet Order:   Diet  Order             Diet NPO time specified  Diet effective ____                  EDUCATION NEEDS:   No education needs have been identified at this time  Skin:   not assessed  Last BM:  pta  Height:   Ht Readings from Last 1 Encounters:  03/02/21 5\' 2"  (1.575 m)    Weight:   Wt Readings from Last 1 Encounters:  03/02/21 61 kg    Ideal Body Weight:  53.6 kg  BMI:  Body mass index is 24.6 kg/m.  Estimated Nutritional Needs:   Kcal:  1700-2000kcal/day  Protein:  85-100g/day  Fluid:  1.5-1.8L/day  MS, RD, LDN Please refer to [facility] for RD and/or RD on-call/weekend/after hours pager

## 2021-03-03 NOTE — Consult Note (Signed)
ORTHOPAEDIC CONSULTATION  REQUESTING PHYSICIAN: Catarina Hartshorn, MD  Chief Complaint: Left hip pain  HPI: Adam Mcclain is a 70 y.o. male who complains of acute left hip pain after mechanical fall.  Unable to ambulate.  He had a previous right hip fracture that underwent intramedullary nailing.  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  He is a limited ambulator and is a fairly poor historian.  Pain is localized over his left hip, worse with movement, better with rest.  Pain is rated as severe with movement.  Past Medical History:  Diagnosis Date   A-fib (HCC)    Acute on chronic respiratory failure with hypoxia (HCC)    Acute stroke due to ischemia [facility])    Anxiety    Arthritis    Atrial fibrillation (HCC)    Chronic back pain    COPD (chronic obstructive pulmonary disease) (HCC)    Dysrhythmia    A-Fib   Gastrostomy status (HCC)    GERD (gastroesophageal reflux disease)    History of kidney stones    HTN (hypertension)    Hypertension    Nephrolithiasis    PAD (peripheral artery disease) (HCC)    Paroxysmal atrial fibrillation (HCC)    PVD (peripheral vascular disease) (HCC)    Seizure disorder (HCC)    Stroke (HCC)    Tracheostomy status (HCC)    Past Surgical History:  Procedure Laterality Date   ABDOMINAL AORTAGRAM  03/25/2013   with Stent   ABDOMINAL AORTAGRAM N/A 03/25/2013   Procedure: ABDOMINAL Ronny Flurry;  Surgeon: Fransisco Hertz, MD;  Location: [facility] CATH LAB;  Service: Cardiovascular;  Laterality: N/A;   AORTOGRAM     CATARACT EXTRACTION Bilateral    CATARACT EXTRACTION, BILATERAL     CHOLECYSTECTOMY     ENDARTERECTOMY Right 06/26/2016   Procedure: ENDARTERECTOMY CAROTID RIGHT;  Surgeon: Fransisco Hertz, MD;  Location: [facility] OR;  Service: Vascular;  Laterality: Right;   EYE SURGERY     IR GASTROSTOMY TUBE MOD SED  02/20/2021   KIDNEY STONE SURGERY     LOWER EXTREMITY ANGIOGRAM N/A 03/25/2013   Procedure: LOWER EXTREMITY  ANGIOGRAM;  Surgeon: Fransisco Hertz, MD;  Location: [facility] CATH LAB;  Service: Cardiovascular;  Laterality: N/A;   PATCH ANGIOPLASTY Right 06/26/2016   Procedure: PATCH ANGIOPLASTY RIGHT CAROTID ARTERY;  Surgeon: Fransisco Hertz, MD;  Location: [facility] OR;  Service: Vascular;  Laterality: Right;   PERCUTANEOUS STENT INTERVENTION Right 03/25/2013   Procedure: PERCUTANEOUS STENT INTERVENTION;  Surgeon: Fransisco Hertz, MD;  Location: [facility] CATH LAB;  Service: Cardiovascular;  Laterality: Right;  icast stent to rt common iliac artery   PERIPHERAL VASCULAR CATHETERIZATION N/A 07/18/2016   Procedure: Abdominal Aortogram w/Lower Extremity;  Surgeon: Fransisco Hertz, MD;  Location: [facility] INVASIVE CV LAB;  Service: Cardiovascular;  Laterality: N/A;   PERIPHERAL VASCULAR CATHETERIZATION Right 07/18/2016   Procedure: Peripheral Vascular Balloon Angioplasty;  Surgeon: Fransisco Hertz, MD;  Location: [facility] INVASIVE CV LAB;  Service: Cardiovascular;  Laterality: Right;  Common Iliac   TRACHEOSTOMY TUBE PLACEMENT     YAG LASER APPLICATION Left 02/20/2015   Procedure: YAG LASER APPLICATION;  Surgeon: Susa Simmonds, MD;  Location: [facility] ORS;  Service: Ophthalmology;  Laterality: Left;   Social History   Socioeconomic History   Marital status: Widowed    Spouse name: Not on file   Number of children: Not on file   Years of education: Not on file  Highest education level: Not on file  Occupational History   Not on file  Tobacco Use   Smoking status: Every Day    Pack years: 0.00    Types: Cigars   Smokeless tobacco: Never   Tobacco comments:    7 cigars a day  Vaping Use   Vaping Use: Never used  Substance and Sexual Activity   Alcohol use: Not Currently   Drug use: No   Sexual activity: Not on file  Other Topics Concern   Not on file  Social History Narrative   ** Merged History Encounter **       Social Determinants of Health   Financial Resource Strain: Not on file  Food Insecurity: Not on file  Transportation  Needs: Not on file  Physical Activity: Not on file  Stress: Not on file  Social Connections: Not on file   Family History  Problem Relation Age of Onset   Hypertension Father    Diabetes Father    Cancer Father    Allergies  Allergen Reactions   No Known Allergies      Positive ROS: All other systems have been reviewed and were otherwise negative with the exception of those mentioned in the HPI and as above.  Physical Exam: General: Alert, no acute distress, he answers questions and interacts appropriately Cardiovascular: No pedal edema Respiratory: No cyanosis, no use of accessory musculature, although he does have a tracheostomy and is able to vocalize GI: No organomegaly, abdomen is soft and non-tender Skin: No lesions in the area of chief complaint Neurologic: Sensation intact distally Psychiatric: Patient seems reasonably competent for consent with normal mood and affect Lymphatic: No axillary or cervical lymphadenopathy  MUSCULOSKELETAL: Left hip has positive logroll with pain to palpation.  EHL and FHL are intact  Assessment: Principal Problem:   Closed intertrochanteric fracture of hip, left, initial encounter [facility]) Active Problems:   Bilateral carotid artery disease (HCC)   Seizure (HCC)   Seizure disorder (HCC)   Paroxysmal atrial fibrillation (HCC)   Tracheostomy status (HCC)   Normocytic anemia   Mild protein-calorie malnutrition (HCC)   Transaminitis   Hypertension     Plan: This is an acute severe injury that has significant risk for both morbidity and mortality.  I recommended left hip intramedullary nailing.  The risks benefits and alternatives were discussed with the patient and his brother including but not limited to the risks of nonoperative treatment, versus surgical intervention including infection, bleeding, nerve injury, malunion, nonunion, the need for revision surgery, hardware prominence, hardware failure, the need for hardware removal,  blood clots, cardiopulmonary complications, morbidity, mortality, among others, and they were willing to proceed.      Eulas Post, MD Cell [phone]   03/03/2021 2:02 PM

## 2021-03-03 NOTE — Op Note (Addendum)
DATE OF SURGERY:  03/03/2021  TIME: 4:37 PM  PATIENT NAME:  [name]  AGE: 70 y.o.  PRE-OPERATIVE DIAGNOSIS:  Left intertrochantric fracture  POST-OPERATIVE DIAGNOSIS:  SAME  PROCEDURE:  INTRAMEDULLARY (IM) NAIL INTERTROCHANTRIC  SURGEON:  Eulas Post  ASSISTANT:  Janine Ores, PA-C, present and scrubbed throughout the case, critical for assistance with exposure, retraction, instrumentation, and closure.  OPERATIVE IMPLANTS: Biomet Affixus size 180 x 11 femoral nail with a cephalomedullary lag screw for the femoral head, and a size 36 distal interlocking bolt.  UNIQUE ASPECTS OF THE CASE:  bone quality was actually quite sclerotic  ESTIMATED BLOOD LOSS: 75 ml  PREOPERATIVE INDICATIONS:  GEROME KOKESH is a 70 y.o. year old who fell and suffered a hip fracture. He was brought into the ER and then admitted and optimized and then elected for surgical intervention.    The risks benefits and alternatives were discussed with the patient including but not limited to the risks of nonoperative treatment, versus surgical intervention including infection, bleeding, nerve injury, malunion, nonunion, hardware prominence, hardware failure, need for hardware removal, blood clots, cardiopulmonary complications, morbidity, mortality, among others, and they were willing to proceed.    OPERATIVE PROCEDURE:  The patient was brought to the operating room and placed in the supine position. Anesthesia was administered. He was placed on the fracture table.  Closed reduction was performed under C-arm guidance.  Time out was then performed after sterile prep and drape. He received preoperative antibiotics.  Incision was made proximal to the greater trochanter. A guidewire was placed in the appropriate position. Confirmation was made on AP and lateral views.  The above-named nail was opened. I opened the proximal femur with a reamer. I then placed the nail by hand down. It was tight, so I reamed to an  11.5.  Once the nail was completely seated, I placed a guidepin into the femoral head into the center center position. I measured the length, and then reamed the lateral cortex and up into the head. I then placed the cephalomedullary screw.  Anatomic fixation achieved. Bone quality was somewhat sclerotic.  I then secured the proximal interlocking bolt, and locked the nail distally using the jig.  I took final C-arm pictures AP and lateral.   Anatomic reconstruction was achieved, and the wounds were irrigated copiously and closed with Vicryl followed by Steri-Strips and sterile gauze for the skin. The patient was awakened and returned to PACU in stable and satisfactory condition. There were no complications and the patient tolerated the procedure well.  He will be weightbearing as tolerated, and will be on Eliquis postoperatively.   Teryl Lucy, M.D.

## 2021-03-03 NOTE — Anesthesia Procedure Notes (Signed)
Procedure Name: Intubation Date/Time: 03/03/2021 4:04 PM Performed by: Claudina Lick, CRNA Pre-anesthesia Checklist: Patient identified, Emergency Drugs available, Suction available and Patient being monitored Patient Re-evaluated:Patient Re-evaluated prior to induction Oxygen Delivery Method: Circle system utilized Preoxygenation: Pre-oxygenation with 100% oxygen Induction Type: IV induction Tube type: Reinforced Tube size: 5.5 mm Number of attempts: 1 (Uncuffed trach removed and 5.5 cuffed reinforced ETT easily placed through stoma. +ETCO2, =BBS. Pt stable.) Placement Confirmation: positive ETCO2 and breath sounds checked- equal and bilateral Tube secured with: Tape Dental Injury: Teeth and Oropharynx as per pre-operative assessment

## 2021-03-04 LAB — BASIC METABOLIC PANEL
Anion gap: 8 (ref 5–15)
BUN: 15 mg/dL (ref 8–23)
CO2: 26 mmol/L (ref 22–32)
Calcium: 8.2 mg/dL — ABNORMAL LOW (ref 8.9–10.3)
Chloride: 101 mmol/L (ref 98–111)
Creatinine, Ser: 1.15 mg/dL (ref 0.61–1.24)
GFR, Estimated: >60 mL/min (ref >60)
Glucose, Bld: 125 mg/dL — ABNORMAL HIGH (ref 70–99)
Potassium: 3.8 mmol/L (ref 3.5–5.1)
Sodium: 135 mmol/L (ref 135–145)

## 2021-03-04 LAB — GLUCOSE, CAPILLARY
Glucose-Capillary: 122 mg/dL — ABNORMAL HIGH (ref 70–99)
Glucose-Capillary: 125 mg/dL — ABNORMAL HIGH (ref 70–99)

## 2021-03-04 LAB — CBC
HCT: 26.8 % — ABNORMAL LOW (ref 39.0–52.0)
Hemoglobin: 8.9 g/dL — ABNORMAL LOW (ref 13.0–17.0)
MCH: 31.2 pg (ref 26.0–34.0)
MCHC: 33.2 g/dL (ref 30.0–36.0)
MCV: 94 fL (ref 80.0–100.0)
Platelets: 212 10*3/uL (ref 150–400)
RBC: 2.85 MIL/uL — ABNORMAL LOW (ref 4.22–5.81)
RDW: 13.2 % (ref 11.5–15.5)
WBC: 12.2 10*3/uL — ABNORMAL HIGH (ref 4.0–10.5)
nRBC: 0 % (ref 0.0–0.2)

## 2021-03-04 MED ORDER — LEVETIRACETAM 100 MG/ML PO SOLN
1000.0000 mg | Freq: Two times a day (BID) | ORAL | Status: DC
  Administered 2021-03-04 – 2021-03-07 (×8): 1000 mg
  Filled 2021-03-04 (×12): qty 10

## 2021-03-04 MED ORDER — THIAMINE HCL 100 MG PO TABS
100.0000 mg | ORAL_TABLET | Freq: Every day | ORAL | Status: DC
  Administered 2021-03-04 – 2021-03-07 (×4): 100 mg
  Filled 2021-03-04 (×4): qty 1

## 2021-03-04 MED ORDER — PROSOURCE TF PO LIQD
45.0000 mL | Freq: Every day | ORAL | Status: DC
  Administered 2021-03-04 – 2021-03-07 (×4): 45 mL
  Filled 2021-03-04 (×5): qty 45

## 2021-03-04 MED ORDER — APIXABAN 5 MG PO TABS: 5.0000 mg | ORAL_TABLET | Freq: Two times a day (BID) | ORAL | Status: DC

## 2021-03-04 MED ORDER — VITAMIN B-12 1000 MCG PO TABS: 1000.0000 ug | ORAL_TABLET | Freq: Every day | ORAL | Status: DC

## 2021-03-04 MED ORDER — THIAMINE HCL 100 MG PO TABS: 100.0000 mg | ORAL_TABLET | Freq: Every day | ORAL | Status: DC

## 2021-03-04 MED ORDER — THIAMINE HCL 100 MG/ML IJ SOLN: 100.0000 mg | Freq: Every day | INTRAMUSCULAR | Status: DC

## 2021-03-04 MED ORDER — OSMOLITE 1.5 CAL PO LIQD
1000.0000 mL | ORAL | Status: DC
  Administered 2021-03-04: 25 mL
  Administered 2021-03-05 – 2021-03-07 (×2): 1000 mL
  Filled 2021-03-04 (×6): qty 1000

## 2021-03-04 MED ORDER — ADULT MULTIVITAMIN W/MINERALS CH
1.0000 | ORAL_TABLET | Freq: Every day | ORAL | Status: DC
  Administered 2021-03-04 – 2021-03-07 (×4): 1
  Filled 2021-03-04 (×4): qty 1

## 2021-03-04 MED ORDER — PANTOPRAZOLE SODIUM 40 MG PO PACK
40.0000 mg | PACK | Freq: Every day | ORAL | Status: DC
  Administered 2021-03-04 – 2021-03-07 (×4): 40 mg
  Filled 2021-03-04 (×4): qty 20

## 2021-03-04 MED ORDER — VITAMIN B-12 1000 MCG PO TABS
500.0000 ug | ORAL_TABLET | Freq: Every day | ORAL | Status: DC
  Administered 2021-03-04 – 2021-03-07 (×4): 500 ug
  Filled 2021-03-04 (×4): qty 1

## 2021-03-04 MED ORDER — QUETIAPINE FUMARATE 100 MG PO TABS
100.0000 mg | ORAL_TABLET | Freq: Two times a day (BID) | ORAL | Status: DC
  Administered 2021-03-04 – 2021-03-07 (×8): 100 mg
  Filled 2021-03-04 (×8): qty 1

## 2021-03-04 MED ORDER — FREE WATER
100.0000 mL | Status: DC
  Administered 2021-03-04 – 2021-03-07 (×21): 100 mL

## 2021-03-04 MED ORDER — CLONAZEPAM 0.5 MG PO TABS
0.5000 mg | ORAL_TABLET | Freq: Every day | ORAL | Status: DC
  Administered 2021-03-04 – 2021-03-07 (×4): 0.5 mg
  Filled 2021-03-04 (×4): qty 1

## 2021-03-04 MED ORDER — APIXABAN 5 MG PO TABS
5.0000 mg | ORAL_TABLET | Freq: Two times a day (BID) | ORAL | Status: DC
  Administered 2021-03-04 – 2021-03-07 (×8): 5 mg
  Filled 2021-03-04 (×8): qty 1

## 2021-03-04 MED ORDER — OSMOLITE 1.2 CAL PO LIQD: 1000.0000 mL | ORAL | Status: DC

## 2021-03-04 MED ORDER — HYDROMORPHONE HCL 1 MG/ML IJ SOLN: 0.5000 mg | INTRAMUSCULAR | Status: DC | PRN

## 2021-03-04 NOTE — Progress Notes (Signed)
SPORTS MEDICINE AND JOINT REPLACEMENT  Georgena Spurling, MD    Laurier Nancy, PA-C [address] [place], [place], [place]  [address]                             [phone]   PROGRESS NOTE  Subjective:  negative for Chest Pain  negative for Shortness of Breath  negative for Nausea/Vomiting   negative for Calf Pain  negative for Bowel Movement   Tolerating Diet: yes         Patient reports pain as 5 on 0-10 scale.    Objective: Vital signs in last 24 hours:   Patient Vitals for the past 24 hrs:  BP Temp Temp src Pulse Resp SpO2  03/04/21 0644 119/69 98.7 F (37.1 C) Axillary 96 18 100 %  03/04/21 0441 -- -- -- (!) 102 20 100 %  03/04/21 0256 119/72 98.4 F (36.9 C) Oral 93 18 98 %  03/04/21 0055 -- -- -- 99 18 95 %  03/03/21 2249 (!) 159/82 98.5 F (36.9 C) Axillary (!) 105 18 100 %  03/03/21 2152 (!) 151/80 98.6 F (37 C) Axillary (!) 102 18 100 %  03/03/21 2053 132/75 98.6 F (37 C) Axillary 94 18 100 %  03/03/21 2005 (!) 146/77 98.8 F (37.1 C) Axillary 93 18 100 %  03/03/21 1954 (!) 116/99 -- -- 100 -- --  03/03/21 1942 -- -- -- 89 18 100 %  03/03/21 1849 134/69 97.7 F (36.5 C) Oral 89 18 100 %  03/03/21 1813 107/62 97.8 F (36.6 C) -- 77 18 100 %  03/03/21 1758 (!) 111/54 -- -- 82 20 99 %  03/03/21 1743 131/62 -- -- 86 20 100 %  03/03/21 1728 (!) 149/76 -- -- 87 20 100 %  03/03/21 1713 (!) 148/90 97.6 F (36.4 C) -- 90 20 100 %  03/03/21 1211 (!) 169/75 98.5 F (36.9 C) Oral 88 16 100 %  03/03/21 0900 139/85 -- -- 81 19 100 %  03/03/21 0842 -- -- -- -- -- 95 %  03/03/21 0800 (!) 148/90 -- -- 84 16 97 %    @flow {1959:LAST@   Intake/Output from previous day:   06/18 0701 - 06/19 0700 In: 1550 [I.V.:1450] Out: 850 [Urine:750]   Intake/Output this shift:   No intake/output data recorded.   Intake/Output      06/18 0701 06/19 0700 06/19 0701 06/20 0700   P.O. 0    I.V. (mL/kg) 1450 (23.8)    Other 0    NG/GT 0    IV Piggyback 100    Total  Intake(mL/kg) 1550 (25.4)    Urine (mL/kg/hr) 750 (0.5)    Emesis/NG output 0    Drains 0    Blood 100    Total Output 850    Net +700            LABORATORY DATA: Recent Labs    02/28/21 0020 03/02/21 1743 03/03/21 0502 03/04/21 0123  WBC 7.1 11.9* 11.0* 12.2*  HGB 10.6* 10.9* 9.7* 8.9*  HCT 31.8* 34.2* 30.1* 26.8*  PLT 191 252 231 212   Recent Labs    02/28/21 0020 03/02/21 1743 03/03/21 0502 03/04/21 0123  NA 136 136 136 135  K 4.0 4.2 4.1 3.8  CL 103 99 102 101  CO2 26 28 29 26   BUN 22 24* 20 15  CREATININE 1.08 1.14 1.09 1.15  GLUCOSE 118* 114* 113* 125*  CALCIUM 8.3* 8.5* 8.5* 8.2*   Lab Results  Component Value Date   INR 1.6 (H) 03/02/2021   INR 1.4 (H) 02/03/2021   INR 1.08 06/24/2016    Examination:  General appearance: alert, cooperative, and no distress Extremities: extremities normal, atraumatic, no cyanosis or edema  Wound Exam: clean, dry, intact   Drainage:  None: wound tissue dry  Motor Exam: Quadriceps and Hamstrings Intact  Sensory Exam: Superficial Peroneal, Deep Peroneal, and Tibial normal   Assessment:    1 Day Post-Op  Procedure(s) (LRB): INTRAMEDULLARY (IM) NAIL INTERTROCHANTRIC (Left)  ADDITIONAL DIAGNOSIS:  Principal Problem:   Closed intertrochanteric fracture of hip, left, initial encounter [facility]) Active Problems:   Bilateral carotid artery disease (HCC)   Seizure (HCC)   Seizure disorder (HCC)   Paroxysmal atrial fibrillation (HCC)   Tracheostomy status (HCC)   Normocytic anemia   Mild protein-calorie malnutrition (HCC)   Transaminitis   Hypertension     Plan: Physical Therapy as ordered Weight Bearing as Tolerated (WBAT)  DVT Prophylaxis:   Eliquis  Patient resting in bed, no drainage, moderate pain. Multiple co morbidities, PT/OT pending. Medicine following   Guy Sandifer 03/04/2021, 7:10 AM

## 2021-03-04 NOTE — Evaluation (Addendum)
Physical Therapy Evaluation Patient Details Name: Adam Mcclain MRN: [id number] DOB: [birth year] Today's Date: 03/04/2021   History of Present Illness  Adam Mcclain is a 70 y.o. male who had an unwitnessed fall at his LTACH, landing on his left side; L intertrochanteric fx, s/p surgical fixation on 6/18, WBAT LLE; Pt comes from [facility] where he was undergoing rehab after hospital admission in May for seizures, R parietal infarcts, compromised respiratory status; with medical history significant of paroxysmal atrial fibrillation, chronic respiratory failure with hypoxia, anxiety, osteoarthritis, chronic back pain, COPD, GERD, elderly testis, hypertension, peripheral arterial disease, seizure disorder, status post gastrostomy and tracheostomy.  Clinical Impression  Pt admitted with above diagnosis. Comes to hospital from [facility], where he was undergoing rehab after admission for seizures, parietal infarcts, with complicated respiratory status; Tells me he has been walking with staff at [facility] in the hallways with RW and assist; Presents to PT on eval with significant pain limiting ROM LLE and limiting activity tolerance, as well as functional mobility; +2 Max assist to come to EOB, with pt seeming to pull self back down with R bedrail at times;  Pt currently with functional limitations due to the deficits listed below (see PT Problem List). Pt will benefit from skilled PT to increase their independence and safety with mobility to allow discharge to the venue listed below.       Follow Up Recommendations LTACH;Other (comment) (return to [facility] to continue rehab efforts) Addendum: I must correct the above statement -- Adam Mcclain was undergoing rehab at [facility]; Pt's family would like him to return there for post-acute rehab, and I heartily agree.   Equipment Recommendations  Rolling walker with 5" wheels;3in1 (PT);Other (comment) (defer to [facility])    Recommendations for Other Services        Precautions / Restrictions Precautions Precautions: Fall;Other (comment) Precaution Comments: Trach Restrictions Weight Bearing Restrictions: Yes LLE Weight Bearing: Weight bearing as tolerated      Mobility  Bed Mobility Overal bed mobility: Needs Assistance Bed Mobility: Supine to Sit;Sit to Supine     Supine to sit: +2 for physical assistance;+2 for safety/equipment;Max assist Sit to supine: +2 for physical assistance;+2 for safety/equipment;Max assist   General bed mobility comments: Used bed pad to support hips and thighs; 2 person Max assist to assist LEs off of teh bed and come to sitting fully upright; Pt tended to hold to the bedrail with RUE, effectively pulling himself down, but eventually was able to place his hands beside his hips sitting EOB briefly    Transfers Overall transfer level: Needs assistance   Transfers: Lateral/Scoot Transfers (simulated)          Lateral/Scoot Transfers: Max assist;+2 physical assistance General transfer comment: Simulated lateral scoot transfer while pt sat EOB, using bed pad to assist in scooting towards [facility]  Ambulation/Gait                Stairs            Wheelchair Mobility    Modified Rankin (Stroke Patients Only)       Balance Overall balance assessment: Needs assistance   Sitting balance-Leahy Scale: Poor                                       Pertinent Vitals/Pain Pain Assessment: Faces Faces Pain Scale: Hurts whole lot Pain Location: Bil LEs, but especially L  hip Pain Descriptors / Indicators: Guarding;Grimacing Pain Intervention(s): Repositioned;Limited activity within patient's tolerance;Monitored during session;Patient requesting pain meds-RN notified    Home Living Family/patient expects to be discharged to:: Other (Comment) (LTACH)                 Additional Comments: Pt comes from [facility] where he was undergoing rehab after hospital admission in May for seizures, R  parietal infarcts, compromised respiratory status    Prior Function Level of Independence: Needs assistance   Gait / Transfers Assistance Needed: Pt reorts he was walking with assist at [facility]  ADL's / Homemaking Assistance Needed: assist from [facility] staff        Hand Dominance        Extremity/Trunk Assessment   Upper Extremity Assessment Upper Extremity Assessment: Overall WFL for tasks assessed (for simple tasks)    Lower Extremity Assessment Lower Extremity Assessment: Generalized weakness;LLE deficits/detail LLE Deficits / Details: Dressing clean, dry and intact; significantly dec hip ROM, limited by pain; able to extend knee against gravity while sitting EOB LLE: Unable to fully assess due to pain LLE Coordination: decreased fine motor;decreased gross motor    Cervical / Trunk Assessment Cervical / Trunk Assessment: Other exceptions Cervical / Trunk Exceptions: genrealized trunk stiffness  Communication   Communication: Tracheostomy  Cognition Arousal/Alertness: Awake/alert Behavior During Therapy: WFL for tasks assessed/performed;Anxious Overall Cognitive Status: No family/caregiver present to determine baseline cognitive functioning                                 General Comments: Very anxious and internally distracted with anticipation of pain      General Comments General comments (skin integrity, edema, etc.): Session conducted on supplemental O2 28% via trach; one episode of decr O2 sats to 80s, but with good cough and getting some secretion moving, O2 sats returned to 96-100% sitting up in bed    Exercises     Assessment/Plan    PT Assessment Patient needs continued PT services  PT Problem List Decreased strength;Decreased range of motion;Decreased activity tolerance;Decreased balance;Decreased mobility;Decreased coordination;Decreased cognition;Decreased knowledge of use of DME;Decreased safety awareness;Decreased knowledge of  precautions;Cardiopulmonary status limiting activity;Pain       PT Treatment Interventions DME instruction;Gait training;Functional mobility training;Therapeutic activities;Therapeutic exercise;Balance training;Neuromuscular re-education;Cognitive remediation;Patient/family education;Wheelchair mobility training    PT Goals (Current goals can be found in the Care Plan section)  Acute Rehab PT Goals Patient Stated Goal: Did not specifically state PT Goal Formulation: Patient unable to participate in goal setting Time For Goal Achievement: 03/18/21 Potential to Achieve Goals: Fair    Frequency Min 2X/week   Barriers to discharge        Co-evaluation               AM-PAC PT "6 Clicks" Mobility  Outcome Measure Help needed turning from your back to your side while in a flat bed without using bedrails?: A Lot Help needed moving from lying on your back to sitting on the side of a flat bed without using bedrails?: A Lot Help needed moving to and from a bed to a chair (including a wheelchair)?: A Lot Help needed standing up from a chair using your arms (e.g., wheelchair or bedside chair)?: Total Help needed to walk in hospital room?: Total Help needed climbing 3-5 steps with a railing? : Total 6 Click Score: 9    End of Session Equipment Utilized During Treatment: Other (comment);Oxygen (bed  pads) Activity Tolerance: Patient limited by pain Patient left: in bed;with call bell/phone within reach;with bed alarm set;Other (comment) (bed to semi-chair position) Nurse Communication: Mobility status;Patient requests pain meds PT Visit Diagnosis: Other abnormalities of gait and mobility (R26.89);Repeated falls (R29.6);Pain Pain - Right/Left: Left Pain - part of body: Leg    Time: 1497-0263 PT Time Calculation (min) (ACUTE ONLY): 19 min   Charges:   PT Evaluation $PT Eval Moderate Complexity: 1 Mod          Van Clines, [place]  Acute Rehabilitation Services Pager  [phone] Office [phone]   Levi Aland 03/04/2021, 1:05 PM

## 2021-03-04 NOTE — Progress Notes (Addendum)
ANTICOAGULATION CONSULT NOTE - Initial Consult  Pharmacy Consult : Restart PTA direct oral anticoagulant (DOAC) on POD1 pending AM hemoglobin being stable  >8 and not requiring blood transfusion  Indication: atrial fibrillation  No Known Allergies  Patient Measurements: Height: 5\' 2"  (157.5 cm) Weight: 61 kg (134 lb 7.7 oz) IBW/kg (Calculated) : 54.6   Vital Signs: Temp: 99.9 F (37.7 C) (06/19 0826) Temp Source: Oral (06/19 0826) BP: 142/76 (06/19 0826) Pulse Rate: 106 (06/19 0826)  Labs: Recent Labs    03/02/21 1743 03/02/21 2112 03/03/21 0502 03/04/21 0123  HGB 10.9*  --  9.7* 8.9*  HCT 34.2*  --  30.1* 26.8*  PLT 252  --  231 212  LABPROT  --  18.8*  --   --   INR  --  1.6*  --   --   CREATININE 1.14  --  1.09 1.15    Estimated Creatinine Clearance: 45.5 mL/min (by C-G formula based on SCr of 1.15 mg/dL).   Medical History: Past Medical History:  Diagnosis Date   A-fib (HCC)    Acute on chronic respiratory failure with hypoxia (HCC)    Acute stroke due to ischemia (HCC)    Anxiety    Arthritis    Atrial fibrillation (HCC)    Chronic back pain    COPD (chronic obstructive pulmonary disease) (HCC)    Dysrhythmia    A-Fib   Gastrostomy status (HCC)    GERD (gastroesophageal reflux disease)    History of kidney stones    HTN (hypertension)    Hypertension    Nephrolithiasis    PAD (peripheral artery disease) (HCC)    Paroxysmal atrial fibrillation (HCC)    PVD (peripheral vascular disease) (HCC)    Seizure disorder (HCC)    Stroke (HCC)    Tracheostomy status (HCC)     Medications:  Medications Prior to Admission  Medication Sig Dispense Refill Last Dose   albuterol (PROVENTIL) (2.5 MG/3ML) 0.083% nebulizer solution Take 3 mLs by nebulization every morning.   03/02/2021 at 0900   Amino Acids (AMINO ACID PO) 30 mLs by PEG Tube route daily. Amino acids liquid   03/02/2021 at 0800   amLODipine (NORVASC) 10 MG tablet Place 1 tablet (10 mg total) into  feeding tube daily. 30 tablet 1 03/02/2021 at 0900   apixaban (ELIQUIS) 5 MG TABS tablet Place 1 tablet (5 mg total) into feeding tube 2 (two) times daily. 1 tablet 0 03/02/2021 at 0800   atorvastatin (LIPITOR) 40 MG tablet Place 1 tablet (40 mg total) into feeding tube daily. 1 tablet 0 03/01/2021 at 2100   ciprofloxacin (CIPRO) 500 MG tablet Place 500 mg into feeding tube 2 (two) times daily.   03/02/2021 at 0800   clonazePAM (KLONOPIN) 0.5 MG tablet Place 1 tablet (0.5 mg total) into feeding tube at bedtime. (Patient taking differently: Place 0.5 mg into feeding tube 3 (three) times daily.) 30 tablet 0 03/02/2021 at 1200   folic acid (FOLVITE) 1 MG tablet Place 1 tablet (1 mg total) into feeding tube daily. 1 tablet 0 03/02/2021 at 0900   levETIRAcetam (KEPPRA) 100 MG/ML solution Place 10 mLs (1,000 mg total) into feeding tube 2 (two) times daily. 473 mL 12 03/02/2021 at 0800   Multiple Vitamin (MULTIVITAMIN WITH MINERALS) TABS tablet Place 1 tablet into feeding tube daily. 1 tablet 0 03/02/2021 at 0900   Nutritional Supplements (FEEDING SUPPLEMENT, OSMOLITE 1.5 CAL,) LIQD Place 1,000 mLs into feeding tube continuous. (Patient taking differently: Place 1,000  mLs into feeding tube continuous. 53ml/hour) 1 mL 0    pantoprazole sodium (PROTONIX) 40 mg/20 mL PACK Place 20 mLs (40 mg total) into feeding tube daily. 1 mL 1 03/02/2021 at 0900   QUEtiapine (SEROQUEL) 100 MG tablet Place 1 tablet (100 mg total) into feeding tube 2 (two) times daily. 1 tablet 0 03/02/2021 at 0800   Skin Protectants, Misc. (EUCERIN) cream Apply 1 application topically as needed for dry skin.   unknown   thiamine 100 MG tablet Place 100 mg into feeding tube daily.   03/02/2021 at 0900   vitamin B-12 (CYANOCOBALAMIN) 500 MCG tablet Place 500 mcg into feeding tube daily.   03/02/2021 at 0900   acetaminophen (TYLENOL) 325 MG tablet Place 2 tablets (650 mg total) into feeding tube every 6 (six) hours as needed for fever. (Patient not taking:  Reported on 03/03/2021) 1 tablet 0 Not Taking   aspirin 81 MG chewable tablet Place 1 tablet (81 mg total) into feeding tube daily. (Patient not taking: Reported on 03/03/2021) 1 tablet 0 Not Taking   clopidogrel (PLAVIX) 75 MG tablet Take 1 tablet (75 mg total) by mouth daily. (Patient not taking: Reported on 03/03/2021) 30 tablet 11 Not Taking   ipratropium-albuterol (DUONEB) 0.5-2.5 (3) MG/3ML SOLN Take 3 mLs by nebulization every 6 (six) hours as needed. (Patient not taking: Reported on 03/03/2021) 1 mL 0 Not Taking   Nutritional Supplements (FEEDING SUPPLEMENT, PROSOURCE TF,) liquid Place 45 mLs into feeding tube 2 (two) times daily. (Patient not taking: Reported on 03/03/2021) 1 mL 0 Not Taking   thiamine (B-1) 100 MG/ML injection Inject 1 mL (100 mg total) into the vein daily. (Patient not taking: Reported on 03/03/2021) 1 mL 0 Not Taking   vitamin B-12 1000 MCG tablet Place 1 tablet (1,000 mcg total) into feeding tube daily. (Patient not taking: Reported on 03/03/2021) 1 tablet 0 Not Taking    Assessment:  PTA on Apixaban 5mg  BID for h/o afib  S/p IM nail left hip on 03/03/21. Pharmacy consulted to restart PTA direct oral anticoagulant (DOAC) on POD1 pending AM hemoglobin being stable  >8 and not requiring blood transfusion.   POD #1 hgb is 8.9,  >8  and is not requiring blood transfusion thus okay per Ortho to restart apixaban 5 mg bid today. Medications are given per tube.  Goal of Therapy:  Stroke prevention and VTE prophylaxis   Plan:  Resume Apixaban 5 mg per tube BID.   03/05/21, RPh Clinical Pharmacist Please check AMION for all [facility] phone numbers After 10:00 PM, call Main Pharmacy [phone]  03/04/2021,8:29 AM

## 2021-03-04 NOTE — Progress Notes (Signed)
PROGRESS NOTE    Adam Mcclain  ZOX:[id number] DOB: 19-Apr-1951 DOA: 03/02/2021 PCP: Pcp, No   Brief Narrative:  70 year old male with a history of paroxysmal atrial fibrillation on apixaban, stroke, chronic respiratory failure with tracheostomy, hypertension, peripheral artery disease, COPD, lipidemia presenting after mechanical [facility]. X-ray was performed at the facility and revealed a left femur fracture.    Of note patient had prolonged hospitalization from 01/19/2021 to 02/02/2021 when he was found unresponsive secondary to seizures.  His hospitalization was prolonged secondary to requiring reintubation x3.  On the third episode, the patient had self extubated on 01/26/2021 and was subsequently reintubated on 01/27/2021.  He underwent tracheostomy on 01/29/2021.  [facility]. Repeatedly failed swallowing evaluation and underwent gastrostomy tube placement on 02/19/2021. Subsequently, the [facility] was [facility].  Assessment & Plan:   Principal Problem:   Closed intertrochanteric fracture of hip, left, initial encounter [facility](HCC) Active Problems:   Bilateral carotid artery disease (HCC)   Seizure (HCC)   Seizure disorder (HCC)   Paroxysmal atrial fibrillation (HCC)   Tracheostomy status (HCC)   Normocytic anemia   Mild protein-calorie malnutrition (HCC)   Transaminitis   Hypertension   Left comminuted proximal femur fracture -Ortho following - ORIF w/ L IM NAIL on 03/03/21 -PT/OT ongoing after surgery   Paroxysmal atrial fibrillation -Currently in sinus rhythm -Holding apixaban in preparation for surgery -Personally reviewed EKG--sinus rhythm, nonspecific T wave changes  Chronic respiratory failure with hypoxia/COPD, at baseline -Patient has tracheostomy with trach collar oxygenation -Stable on trach collar -RT to follow   Failure to thrive, dysphagia -NPO,  resume tube feeds per dietary recommendations   Seizure disorder -Continue Keppra 500 mg twice daily   Essential hypertension -Holding amlodipine and lisinopril/HCTZ due to soft blood pressure -Hypotension likely in the setting of narcotics -the need to restart home meds as narcotics are weaned down postoperatively   Depression/anxiety -Resume Seroquel, Klonopin   Carotid stenosis -At the time of his last hospital admission in May 2022, the patient was noted to have 80% right CCA stenosis -deemed unstable for intervention -resume apixaban when ok with ortho   Hyperlipidemia -Resume statin post op   DVT prophylaxis: Eliquis Code Status: DNR Family Communication: None present  Status is: Inpt  Dispo: The patient is from: Facility              Anticipated d/c is to: Facility              Anticipated d/c date is: 48 to 72 hours              Patient currently not medically stable for discharge  Consultants:  Ortho  Procedures:  ORIF - L IM NAIL - 03/03/21  Antimicrobials:  None indicated   Subjective: No acute issues or events overnight, patient states pain is moderately well controlled, somewhat anxious for PT today  Objective: Vitals:   03/04/21 0055 03/04/21 0256 03/04/21 0441 03/04/21 0644  BP:  119/72  119/69  Pulse: 99 93 (!) 102 96  Resp: 18 18 20 18   Temp:  98.4 F (36.9 C)  98.7 F (37.1 C)  TempSrc:  Oral  Axillary  SpO2: 95% 98% 100% 100%  Weight:      Height:        Intake/Output Summary (Last 24 hours) at 03/04/2021 0736 Last data filed at 03/04/2021 0045 Gross per 24 hour  Intake 1550 ml  Output 850 ml  Net 700 ml   Filed Weights   03/02/21 1656  Weight: 61 kg    Examination:  General:  Pleasantly resting in bed, No acute distress. HEENT:  Normocephalic atraumatic.  Sclerae nonicteric, noninjected.  Extraocular movements intact bilaterally. Neck: Tracheostomy site clean dry intact Lungs:  Clear to auscultate bilaterally without  rhonchi, wheeze, or rales. Heart:  Regular rate and rhythm.  Without murmurs, rubs, or gallops. Abdomen:  Soft, nontender, nondistended.  PEG tube site clean dry intact Extremities: Without cyanosis, clubbing, edema, or obvious deformity.  Left postop bandage clean dry intact Vascular:  Dorsalis pedis and posterior tibial pulses palpable bilaterally. Skin:  Warm and dry, no erythema, no ulcerations.  Data Reviewed: I have personally reviewed following labs and imaging studies  CBC: Recent Labs  Lab 02/28/21 0020 03/02/21 1743 03/03/21 0502 03/04/21 0123  WBC 7.1 11.9* 11.0* 12.2*  NEUTROABS  --  10.4*  --   --   HGB 10.6* 10.9* 9.7* 8.9*  HCT 31.8* 34.2* 30.1* 26.8*  MCV 94.4 98.6 97.1 94.0  PLT 191 252 231 212   Basic Metabolic Panel: Recent Labs  Lab 02/28/21 0020 03/02/21 1743 03/03/21 0502 03/04/21 0123  NA 136 136 136 135  K 4.0 4.2 4.1 3.8  CL 103 99 102 101  CO2 26 28 29 26   GLUCOSE 118* 114* 113* 125*  BUN 22 24* 20 15  CREATININE 1.08 1.14 1.09 1.15  CALCIUM 8.3* 8.5* 8.5* 8.2*  MG 1.9  --   --   --    GFR: Estimated Creatinine Clearance: 45.5 mL/min (by C-G formula based on SCr of 1.15 mg/dL). Liver Function Tests: Recent Labs  Lab 03/02/21 1743 03/03/21 0502  AST 45* 36  ALT 69* 60*  ALKPHOS 110 108  BILITOT 0.2* 0.5  PROT 7.0 6.1*  ALBUMIN 3.0* 2.8*   No results for input(s): LIPASE, AMYLASE in the last 168 hours. No results for input(s): AMMONIA in the last 168 hours. Coagulation Profile: Recent Labs  Lab 03/02/21 2112  INR 1.6*   Cardiac Enzymes: No results for input(s): CKTOTAL, CKMB, CKMBINDEX, TROPONINI in the last 168 hours. BNP (last 3 results) No results for input(s): PROBNP in the last 8760 hours. HbA1C: No results for input(s): HGBA1C in the last 72 hours. CBG: No results for input(s): GLUCAP in the last 168 hours. Lipid Profile: No results for input(s): CHOL, HDL, LDLCALC, TRIG, CHOLHDL, LDLDIRECT in the last 72  hours. Thyroid Function Tests: No results for input(s): TSH, T4TOTAL, FREET4, T3FREE, THYROIDAB in the last 72 hours. Anemia Panel: No results for input(s): VITAMINB12, FOLATE, FERRITIN, TIBC, IRON, RETICCTPCT in the last 72 hours. Sepsis Labs: No results for input(s): PROCALCITON, LATICACIDVEN in the last 168 hours.  Recent Results (from the past 240 hour(s))  Culture, Respiratory w Gram Stain     Status: None   Collection Time: 02/23/21 10:15 AM   Specimen: Tracheal Aspirate; Respiratory  Result Value Ref Range Status   Specimen Description TRACHEAL ASPIRATE  Final   Special Requests NONE  Final   Gram Stain   Final    MODERATE WBC PRESENT,BOTH PMN AND MONONUCLEAR MODERATE GRAM POSITIVE COCCI IN PAIRS MODERATE GRAM NEGATIVE RODS RARE GRAM POSITIVE RODS FEW SQUAMOUS EPITHELIAL CELLS PRESENT Performed at [facility], [address] [place], [place]    Culture RARE PSEUDOMONAS AERUGINOSA  Final   Report Status 02/28/2021 FINAL  Final   Organism ID, Bacteria PSEUDOMONAS  AERUGINOSA  Final      Susceptibility   Pseudomonas aeruginosa - MIC*    CEFTAZIDIME 4 SENSITIVE Sensitive     CIPROFLOXACIN <=0.25 SENSITIVE Sensitive     GENTAMICIN <=1 SENSITIVE Sensitive     IMIPENEM 1 SENSITIVE Sensitive     PIP/TAZO 8 SENSITIVE Sensitive     CEFEPIME 2 SENSITIVE Sensitive     * RARE PSEUDOMONAS AERUGINOSA  SARS CORONAVIRUS 2 (TAT 6-24 HRS) Nasopharyngeal Nasopharyngeal Swab     Status: None   Collection Time: 02/28/21 11:25 AM   Specimen: Nasopharyngeal Swab  Result Value Ref Range Status   SARS Coronavirus 2 NEGATIVE NEGATIVE Final    Comment: (NOTE) SARS-CoV-2 target nucleic acids are NOT DETECTED.  The SARS-CoV-2 RNA is generally detectable in upper and lower respiratory specimens during the acute phase of infection. Negative results do not preclude SARS-CoV-2 infection, do not rule out co-infections with other pathogens, and should not be used as the sole basis  for treatment or other patient management decisions. Negative results must be combined with clinical observations, patient history, and epidemiological information. The expected result is Negative.  Fact Sheet for Patients: HairSlick.no  Fact Sheet for Healthcare Providers: quierodirigir.com  This test is not yet approved or cleared by the [place] FDA and  has been authorized for detection and/or diagnosis of SARS-CoV-2 by FDA under an Emergency Use Authorization (EUA). This EUA will remain  in effect (meaning this test can be used) for the duration of the COVID-19 declaration under Se ction 564(b)(1) of the Act, 21 U.S.C. section 360bbb-3(b)(1), unless the authorization is terminated or revoked sooner.  Performed at [facility], [address], [place], [place] [address]   Resp Panel by RT-PCR (Flu A&B, Covid) Nasopharyngeal Swab     Status: None   Collection Time: 03/02/21  9:00 PM   Specimen: Nasopharyngeal Swab; Nasopharyngeal(NP) swabs in vial transport medium  Result Value Ref Range Status   SARS Coronavirus 2 by RT PCR NEGATIVE NEGATIVE Final    Comment: (NOTE) SARS-CoV-2 target nucleic acids are NOT DETECTED.  The SARS-CoV-2 RNA is generally detectable in upper respiratory specimens during the acute phase of infection. The lowest concentration of SARS-CoV-2 viral copies this assay can detect is 138 copies/mL. A negative result does not preclude SARS-Cov-2 infection and should not be used as the sole basis for treatment or other patient management decisions. A negative result may occur with  improper specimen collection/handling, submission of specimen other than nasopharyngeal swab, presence of viral mutation(s) within the areas targeted by this assay, and inadequate number of viral copies(<138 copies/mL). A negative result must be combined with clinical observations, patient history, and  epidemiological information. The expected result is Negative.  Fact Sheet for Patients:  BloggerCourse.com  Fact Sheet for Healthcare Providers:  SeriousBroker.it  This test is no t yet approved or cleared by the [place] FDA and  has been authorized for detection and/or diagnosis of SARS-CoV-2 by FDA under an Emergency Use Authorization (EUA). This EUA will remain  in effect (meaning this test can be used) for the duration of the COVID-19 declaration under Section 564(b)(1) of the Act, 21 U.S.C.section 360bbb-3(b)(1), unless the authorization is terminated  or revoked sooner.       Influenza A by PCR NEGATIVE NEGATIVE Final   Influenza B by PCR NEGATIVE NEGATIVE Final    Comment: (NOTE) The Xpert Xpress SARS-CoV-2/FLU/RSV plus assay is intended as an aid in the diagnosis of influenza from Nasopharyngeal swab  specimens and should not be used as a sole basis for treatment. Nasal washings and aspirates are unacceptable for Xpert Xpress SARS-CoV-2/FLU/RSV testing.  Fact Sheet for Patients: BloggerCourse.com  Fact Sheet for Healthcare Providers: SeriousBroker.it  This test is not yet approved or cleared by the [place] FDA and has been authorized for detection and/or diagnosis of SARS-CoV-2 by FDA under an Emergency Use Authorization (EUA). This EUA will remain in effect (meaning this test can be used) for the duration of the COVID-19 declaration under Section 564(b)(1) of the Act, 21 U.S.C. section 360bbb-3(b)(1), unless the authorization is terminated or revoked.  Performed at [facility], [address], [place], [place] [address]   Surgical pcr screen     Status: None   Collection Time: 03/03/21 12:19 PM   Specimen: Nasal Mucosa; Nasal Swab  Result Value Ref Range Status   MRSA, PCR NEGATIVE NEGATIVE Final   Staphylococcus aureus NEGATIVE NEGATIVE Final     Comment: (NOTE) The Xpert SA Assay (FDA approved for NASAL specimens in patients 33 years of age and older), is one component of a comprehensive surveillance program. It is not intended to diagnose infection nor to guide or monitor treatment. Performed at [facility], [address], [place], [place] [address]          Radiology Studies: DG Chest 1 View  Result Date: 03/02/2021 CLINICAL DATA:  Cough and known left hip fracture EXAM: CHEST  1 VIEW COMPARISON:  02/23/2021 FINDINGS: Cardiac shadow is enlarged but stable. Tracheostomy tube is noted in satisfactory position. The lungs are clear bilaterally. No acute bony abnormality is seen. Gastrostomy catheter is noted within the stomach. IMPRESSION: No acute abnormality noted. Electronically Signed   By: Alcide Clever M.D.   On: 03/02/2021 19:20   CT Head Wo Contrast  Result Date: 03/02/2021 CLINICAL DATA:  Recent fall EXAM: CT HEAD WITHOUT CONTRAST TECHNIQUE: Contiguous axial images were obtained from the base of the skull through the vertex without intravenous contrast. COMPARISON:  01/28/2021 FINDINGS: Brain: Mild atrophic changes and chronic white matter ischemic changes are seen. 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 No acute hemorrhage or acute infarct is seen. Vascular: No hyperdense vessel or unexpected calcification. Skull: Normal. Negative for fracture or focal lesion. Sinuses/Orbits: Opacification of the right maxillary antrum is noted increased in the interval from the prior exam. Additionally fluid is noted within the right ethmoid and frontal sinuses chronic in nature. Other: None. IMPRESSION: Chronic atrophic and ischemic changes intracranially. Progressive sinus disease particularly in the right maxillary antrum. Electronically Signed   By: Alcide Clever M.D.   On: 03/02/2021 18:56   DG C-Arm 1-60 Min  Result Date: 03/03/2021 CLINICAL DATA:  Left femur IM nail. EXAM:  LEFT FEMUR 2 VIEWS; DG C-ARM 1-60 MIN COMPARISON:  Hip radiograph yesterday. FINDINGS: Four fluoroscopic spot views of the left proximal femur obtained in the operating room in frontal and lateral projection. Intramedullary nail with distal locking and trans trochanteric screw traverses intertrochanteric femur fracture. Fluoroscopy time 1 minutes 37 seconds. IMPRESSION: Fluoroscopic spot views during left femur fracture ORIF. Electronically Signed   By: Narda Rutherford M.D.   On: 03/03/2021 16:47   DG HIP UNILAT WITH PELVIS 2-3 VIEWS LEFT  Result Date: 03/03/2021 CLINICAL DATA:  ORIF left hip fracture. EXAM: DG HIP (WITH OR WITHOUT PELVIS) 2-3V LEFT COMPARISON:  Radiograph yesterday. FINDINGS: Intramedullary nail with trans trochanteric and distal locking screws traverse intertrochanteric  femur fracture. The fracture is in improved alignment compared to preoperative imaging. Persistent displacement of the lesser trochanter. Recent postsurgical change includes air and edema in the soft tissues. Prior intramedullary nail fixation of the right femur is partially included with intact hardware where visualized. IMPRESSION: ORIF intertrochanteric femur fracture, in improved alignment compared to preoperative imaging. Electronically Signed   By: Narda Rutherford M.D.   On: 03/03/2021 17:54   DG Hip Unilat With Pelvis 2-3 Views Left  Result Date: 03/02/2021 CLINICAL DATA:  Recent fall with left hip pain, initial encounter EXAM: DG HIP (WITH OR WITHOUT PELVIS) 2-3V LEFT COMPARISON:  None. FINDINGS: Pelvic ring is intact. Postsurgical changes are noted in the proximal right femur. Comminuted intratrochanteric fracture of the left femur is seen. Impaction is noted at the fracture site. No soft tissue abnormality is noted. IMPRESSION: Comminuted intratrochanteric fracture of the proximal left femur. Electronically Signed   By: Alcide Clever M.D.   On: 03/02/2021 19:17   DG Hip Unilat With Pelvis 2-3 Views  Right  Result Date: 03/02/2021 CLINICAL DATA:  Recent fall with right hip pain, initial encounter EXAM: DG HIP (WITH OR WITHOUT PELVIS) 2-3V RIGHT COMPARISON:  None. FINDINGS: Postsurgical changes are noted in the proximal right femur with medullary rod and fixation screw traversing the femoral neck. Healed proximal right femoral fracture is noted. Visualized pelvic ring is unremarkable. No soft tissue abnormality is noted. IMPRESSION: Postsurgical change in the right hip without acute abnormality. Electronically Signed   By: Alcide Clever M.D.   On: 03/02/2021 19:19   DG FEMUR MIN 2 VIEWS LEFT  Result Date: 03/03/2021 CLINICAL DATA:  Left femur IM nail. EXAM: LEFT FEMUR 2 VIEWS; DG C-ARM 1-60 MIN COMPARISON:  Hip radiograph yesterday. FINDINGS: Four fluoroscopic spot views of the left proximal femur obtained in the operating room in frontal and lateral projection. Intramedullary nail with distal locking and trans trochanteric screw traverses intertrochanteric femur fracture. Fluoroscopy time 1 minutes 37 seconds. IMPRESSION: Fluoroscopic spot views during left femur fracture ORIF. Electronically Signed   By: Narda Rutherford M.D.   On: 03/03/2021 16:47    Scheduled Meds:  albuterol  2.5 mg Nebulization Daily   Continuous Infusions:  0.45 % NaCl with KCl 20 mEq / L 50 mL/hr at 03/04/21 0032    ceFAZolin (ANCEF) IV 2 g (03/03/21 2322)   levETIRAcetam 1,000 mg (03/03/21 2210)     LOS: 2 days   Time spent:  Azucena Fallen, DO [facility]  If 7PM-7AM, please contact night-coverage [email or web address]  03/04/2021, 7:36 AM

## 2021-03-05 LAB — GLUCOSE, CAPILLARY
Glucose-Capillary: 125 mg/dL — ABNORMAL HIGH (ref 70–99)
Glucose-Capillary: 125 mg/dL — ABNORMAL HIGH (ref 70–99)
Glucose-Capillary: 137 mg/dL — ABNORMAL HIGH (ref 70–99)
Glucose-Capillary: 153 mg/dL — ABNORMAL HIGH (ref 70–99)
Glucose-Capillary: 181 mg/dL — ABNORMAL HIGH (ref 70–99)

## 2021-03-05 LAB — BASIC METABOLIC PANEL
Anion gap: 8 (ref 5–15)
BUN: 14 mg/dL (ref 8–23)
CO2: 27 mmol/L (ref 22–32)
Calcium: 8 mg/dL — ABNORMAL LOW (ref 8.9–10.3)
Chloride: 99 mmol/L (ref 98–111)
Creatinine, Ser: 1.19 mg/dL (ref 0.61–1.24)
GFR, Estimated: >60 mL/min (ref >60)
Glucose, Bld: 146 mg/dL — ABNORMAL HIGH (ref 70–99)
Potassium: 3.2 mmol/L — ABNORMAL LOW (ref 3.5–5.1)
Sodium: 134 mmol/L — ABNORMAL LOW (ref 135–145)

## 2021-03-05 LAB — CBC
HCT: 20.5 % — ABNORMAL LOW (ref 39.0–52.0)
Hemoglobin: 6.7 g/dL — CL (ref 13.0–17.0)
MCH: 31.2 pg (ref 26.0–34.0)
MCHC: 32.7 g/dL (ref 30.0–36.0)
MCV: 95.3 fL (ref 80.0–100.0)
Platelets: 184 10*3/uL (ref 150–400)
RBC: 2.15 MIL/uL — ABNORMAL LOW (ref 4.22–5.81)
RDW: 13.5 % (ref 11.5–15.5)
WBC: 7.3 10*3/uL (ref 4.0–10.5)
nRBC: 0 % (ref 0.0–0.2)

## 2021-03-05 LAB — FOLATE: Folate: 22.4 ng/mL (ref >5.9)

## 2021-03-05 LAB — VITAMIN B12: Vitamin B-12: 223 pg/mL (ref 180–914)

## 2021-03-05 LAB — FERRITIN: Ferritin: 602 ng/mL — ABNORMAL HIGH (ref 24–336)

## 2021-03-05 LAB — RETICULOCYTES
Immature Retic Fract: 16.5 % — ABNORMAL HIGH (ref 2.3–15.9)
RBC.: 2.07 MIL/uL — ABNORMAL LOW (ref 4.22–5.81)
Retic Count, Absolute: 64.2 10*3/uL (ref 19.0–186.0)
Retic Ct Pct: 3.1 % (ref 0.4–3.1)

## 2021-03-05 LAB — IRON AND TIBC
Iron: 16 ug/dL — ABNORMAL LOW (ref 45–182)
Saturation Ratios: 10 % — ABNORMAL LOW (ref 17.9–39.5)
TIBC: 162 ug/dL — ABNORMAL LOW (ref 250–450)
UIBC: 146 ug/dL

## 2021-03-05 LAB — PREPARE RBC (CROSSMATCH)

## 2021-03-05 MED ORDER — SODIUM CHLORIDE 0.9 % IV SOLN
250.0000 mg | Freq: Once | INTRAVENOUS | Status: AC
  Administered 2021-03-05: 250 mg via INTRAVENOUS
  Filled 2021-03-05: qty 20

## 2021-03-05 MED ORDER — SODIUM CHLORIDE 0.9% IV SOLUTION: Freq: Once | INTRAVENOUS | Status: DC

## 2021-03-05 MED ORDER — SODIUM CHLORIDE 0.9 % IV SOLN: 250.0000 mg | Freq: Once | INTRAVENOUS | Status: DC

## 2021-03-05 NOTE — TOC Initial Note (Signed)
Transition of Care [facility]) - Initial/Assessment Note    Patient Details  Name: Adam Mcclain MRN: [id number] Date of Birth: Sep 17, 1950  Transition of Care [facility]) CM/SW Contact:    Epifanio Lesches, RN Phone Number: 03/05/2021, 2:07 PM  Clinical Narrative:   Admitted after a fall @ SNF, [facility]. Suffered a L hip fx.Marland Kitchen Hx of paroxysmal atrial fibrillation on apixaban, stroke, chronic respiratory failure with tracheostomy, hypertension, peripheral artery disease, COPD, lipidemia. Pt confused. NCM spoke with bother Brett Canales [phone]) regading d/c planning by phone. Brett Canales stated we should contact him with any  concerns involving pt. States mother is in he nineties, daughter is in jail.       RNCM spoke with Brett Canales regarding PT  potential need for SNF placement at time of discharge. Brett Canales reported patient is currently unable to care for self independently given patient's current physical needs and fall risk. Brett Canales expressed he would like for pt to return to [facility]  @ d/c if able. RNCM discussed insurance authorization process and left Medicare SNF ratings list Patient expressed being hopeful for rehab and to feel better soon. No further questions reported at this time.   RNCM to continue to follow and assist with discharge planning needs.   Expected Discharge Plan: Skilled Nursing Facility Barriers to Discharge: Continued Medical Work up   Patient Goals and CMS Choice        Expected Discharge Plan and Services Expected Discharge Plan: Skilled Nursing Facility                                              Prior Living Arrangements/Services                       Activities of Daily Living Home Assistive Devices/Equipment: Environmental consultant (specify type) ADL Screening (condition at time of admission) Patient's cognitive ability adequate to safely complete daily activities?: No Is the patient deaf or have difficulty hearing?: No Does the patient have difficulty  seeing, even when wearing glasses/contacts?: No Does the patient have difficulty concentrating, remembering, or making decisions?: Yes Patient able to express need for assistance with ADLs?: Yes Does the patient have difficulty dressing or bathing?: Yes Independently performs ADLs?: No Communication: Independent Dressing (OT): Needs assistance Is this a change from baseline?: Pre-admission baseline Grooming: Needs assistance Is this a change from baseline?: Pre-admission baseline Does the patient have difficulty walking or climbing stairs?: Yes Weakness of Legs: Both Weakness of Arms/Hands: None  Permission Sought/Granted                  Emotional Assessment              Admission diagnosis:  Closed fracture of trochanter of left femur, initial encounter (HCC) [S72.102A] Fall, initial encounter [W19.XXXA] Closed intertrochanteric fracture of hip, left, initial encounter [facility]) [S72.142A] Patient Active Problem List   Diagnosis Date Noted   Closed intertrochanteric fracture of hip, left, initial encounter (HCC) 03/02/2021   Normocytic anemia 03/02/2021   Mild protein-calorie malnutrition (HCC) 03/02/2021   Transaminitis 03/02/2021   Hypertension    Acute on chronic respiratory failure with hypoxia (HCC)    Seizure disorder (HCC)    Acute stroke due to ischemia [facility])    Paroxysmal atrial fibrillation (HCC)    Tracheostomy status (HCC)    Protein-calorie malnutrition, severe 01/26/2021  Cerebral thrombosis with cerebral infarction 01/24/2021   Pressure injury of skin 01/23/2021   Seizure (HCC) 01/19/2021   Bilateral carotid artery disease (HCC) 06/26/2016   Carotid artery disease (HCC) 05/10/2016   Cold hands 04/23/2013   Peripheral vascular disease, unspecified (HCC) 03/12/2013   Atherosclerosis of native arteries of extremity with intermittent claudication (HCC) 03/12/2013   Lumbago 03/12/2013   PCP:  [facility]:   [facility] [address], [place] -  [address] [place] [address] Phone: [phone] Fax: [phone]  [facility] [place], [place] - [address] [place] [address] Phone: [phone] Fax: [phone]  [facility] [address], [place] - [address] [place] [address] Phone: [phone] Fax: [phone]  [facility] [place], [place] - [address] [place] [address] Phone: [phone] Fax: [phone]     Social Determinants of Health (SDOH) Interventions    Readmission Risk Interventions No flowsheet data found.

## 2021-03-05 NOTE — Progress Notes (Signed)
Nutrition Follow-up  DOCUMENTATION CODES:   Not applicable  INTERVENTION:   -Continue Osmolite 1.5 @ 55 ml/hr via PEG  45 ml Prosource TF daily.    100 ml free water flush every 4 hours  Tube feeding regimen provides 2020 kcal (100% of needs), 95 grams of protein, and 1006 ml of H2O. Total free water: 1606 ml daily  NUTRITION DIAGNOSIS:   Inadequate oral intake related to dysphagia as evidenced by NPO status (pt with chronic G-tube).  Ongoing  GOAL:   Patient will meet greater than or equal to 90% of their needs  Met with TF  MONITOR:   Labs, Weight trends, TF tolerance, Skin, I & O's  REASON FOR ASSESSMENT:   Consult Assessment of nutrition requirement/status  ASSESSMENT:   70 y/o male with h/o seizures, Afib, HTN, PVD, stroke, COPD, GERD and prolonged hospitalization from 01/19/2021 to 02/02/2021 for seizures complicated by prolonged hospitalization requiring reintubation x3, tracheostomy on 01/29/2021, failed swallowing evaluation and IR gastrostomy tube placement on 02/20/2021 who was discharged to [facility] on 03/01/2021 and now returns with hip fracture after fall.  6/18- s/p PROCEDURE:  INTRAMEDULLARY (IM) NAIL INTERTROCHANTRIC  Reviewed I/O's: -375 ml x 24 hours and +325 ml since admission  UOP: 1.2 L x 24 hours  Pt with trach, on trach collar.   Case discussed with RN, who reports pt is tolerating TF well.   Pt very lethargic at time of visit. He did not respond to voice or touch.   Medications reviewed and include keppra, thiamine, and vitamin B-12.   Labs reviewed: Na: 134, K: 3.2, CBGS: 122-153.   NUTRITION - FOCUSED PHYSICAL EXAM:  Flowsheet Row Most Recent Value  Orbital Region No depletion  Upper Arm Region No depletion  Thoracic and Lumbar Region No depletion  Buccal Region No depletion  Temple Region Mild depletion  Clavicle Bone Region No depletion  Clavicle and Acromion Bone Region No depletion  Scapular Bone Region No depletion   Dorsal Hand No depletion  Patellar Region Mild depletion  Anterior Thigh Region Mild depletion  Posterior Calf Region Mild depletion  Edema (RD Assessment) None  Hair Reviewed  Eyes Reviewed  Mouth Reviewed  Skin Reviewed  Nails Reviewed       Diet Order:   Diet Order     None       EDUCATION NEEDS:   No education needs have been identified at this time  Skin:  Skin Assessment: Skin Integrity Issues: Skin Integrity Issues:: Incisions Incisions: closed lt thigh  Last BM:  Unknown  Height:   Ht Readings from Last 1 Encounters:  03/02/21 5' 2" (1.575 m)    Weight:   Wt Readings from Last 1 Encounters:  03/02/21 61 kg    Ideal Body Weight:  53.6 kg  BMI:  Body mass index is 24.6 kg/m.  Estimated Nutritional Needs:   Kcal:  1700-2000kcal/day  Protein:  85-100g/day  Fluid:  1.5-1.8L/day     W, RD, LDN, CDCES Registered Dietitian II Certified Diabetes Care and Education Specialist Please refer to AMION for RD and/or RD on-call/weekend/after hours pager  

## 2021-03-05 NOTE — Progress Notes (Signed)
Subjective: 2 Days Post-Op s/p Procedure(s): INTRAMEDULLARY (IM) NAIL INTERTROCHANTRIC  Patient somnolent on exam. Does respond that he is in some pain this morning. Able to follow commands.   Objective:  PE: VITALS:   Vitals:   03/04/21 1513 03/04/21 2030 03/04/21 2043 03/04/21 2358  BP:  (!) 115/55    Pulse: 94 97 88 83  Resp: 16 18 18 18   Temp:  99.1 F (37.3 C)    TempSrc:      SpO2: 98% 99% 97% 100%  Weight:      Height:        Sensation intact distally Intact pulses distally Dorsiflexion/Plantar flexion intact Incision: dressing C/D/I  LABS  Results for orders placed or performed during the hospital encounter of 03/02/21 (from the past 24 hour(s))  CBC     Status: Abnormal   Collection Time: 03/04/21  1:23 AM  Result Value Ref Range   WBC 12.2 (H) 4.0 - 10.5 K/uL   RBC 2.85 (L) 4.22 - 5.81 MIL/uL   Hemoglobin 8.9 (L) 13.0 - 17.0 g/dL   HCT 03/06/21 (L) 17.5 - 10.2 %   MCV 94.0 80.0 - 100.0 fL   MCH 31.2 26.0 - 34.0 pg   MCHC 33.2 30.0 - 36.0 g/dL   RDW 58.5 27.7 - 82.4 %   Platelets 212 150 - 400 K/uL   nRBC 0.0 0.0 - 0.2 %  Basic metabolic panel     Status: Abnormal   Collection Time: 03/04/21  1:23 AM  Result Value Ref Range   Sodium 135 135 - 145 mmol/L   Potassium 3.8 3.5 - 5.1 mmol/L   Chloride 101 98 - 111 mmol/L   CO2 26 22 - 32 mmol/L   Glucose, Bld 125 (H) 70 - 99 mg/dL   BUN 15 8 - 23 mg/dL   Creatinine, Ser 03/06/21 0.61 - 1.24 mg/dL   Calcium 8.2 (L) 8.9 - 10.3 mg/dL   GFR, Estimated 3.61 >44 mL/min   Anion gap 8 5 - 15  Glucose, capillary     Status: Abnormal   Collection Time: 03/04/21  6:04 PM  Result Value Ref Range   Glucose-Capillary 122 (H) 70 - 99 mg/dL  Glucose, capillary     Status: Abnormal   Collection Time: 03/04/21  8:07 PM  Result Value Ref Range   Glucose-Capillary 125 (H) 70 - 99 mg/dL  Glucose, capillary     Status: Abnormal   Collection Time: 03/05/21 12:26 AM  Result Value Ref Range   Glucose-Capillary 125 (H) 70  - 99 mg/dL    DG C-Arm 03/07/21 Min  Result Date: 03/03/2021 CLINICAL DATA:  Left femur IM nail. EXAM: LEFT FEMUR 2 VIEWS; DG C-ARM 1-60 MIN COMPARISON:  Hip radiograph yesterday. FINDINGS: Four fluoroscopic spot views of the left proximal femur obtained in the operating room in frontal and lateral projection. Intramedullary nail with distal locking and trans trochanteric screw traverses intertrochanteric femur fracture. Fluoroscopy time 1 minutes 37 seconds. IMPRESSION: Fluoroscopic spot views during left femur fracture ORIF. Electronically Signed   By: 03/05/2021 M.D.   On: 03/03/2021 16:47   DG HIP UNILAT WITH PELVIS 2-3 VIEWS LEFT  Result Date: 03/03/2021 CLINICAL DATA:  ORIF left hip fracture. EXAM: DG HIP (WITH OR WITHOUT PELVIS) 2-3V LEFT COMPARISON:  Radiograph yesterday. FINDINGS: Intramedullary nail with trans trochanteric and distal locking screws traverse intertrochanteric femur fracture. The fracture is in improved alignment compared to preoperative imaging. Persistent displacement of the lesser trochanter.  Recent postsurgical change includes air and edema in the soft tissues. Prior intramedullary nail fixation of the right femur is partially included with intact hardware where visualized. IMPRESSION: ORIF intertrochanteric femur fracture, in improved alignment compared to preoperative imaging. Electronically Signed   By: Narda Rutherford M.D.   On: 03/03/2021 17:54   DG FEMUR MIN 2 VIEWS LEFT  Result Date: 03/03/2021 CLINICAL DATA:  Left femur IM nail. EXAM: LEFT FEMUR 2 VIEWS; DG C-ARM 1-60 MIN COMPARISON:  Hip radiograph yesterday. FINDINGS: Four fluoroscopic spot views of the left proximal femur obtained in the operating room in frontal and lateral projection. Intramedullary nail with distal locking and trans trochanteric screw traverses intertrochanteric femur fracture. Fluoroscopy time 1 minutes 37 seconds. IMPRESSION: Fluoroscopic spot views during left femur fracture ORIF.  Electronically Signed   By: Narda Rutherford M.D.   On: 03/03/2021 16:47    Assessment/Plan: Intertrochanteric L hip fracture  2 Days Post-Op s/p Procedure(s): INTRAMEDULLARY (IM) NAIL INTERTROCHANTRIC  Weightbearing: WBAT LLE Insicional and dressing care: Reinforce dressings as needed VTE prophylaxis: home eliquis restarted Pain control: continue current regimen Follow - up plan: with Dr. Dion Saucier in 2 weeks Dispo: Ok for discharge from ortho standpoint when SNF bed available.   Contact information:   Weekdays 8-5 Janine Ores, [place] [phone] A fter hours and holidays please check [email or web address] for group call information for [facility]  Armida Sans 03/05/2021, 12:58 AM

## 2021-03-05 NOTE — Progress Notes (Signed)
PROGRESS NOTE    Adam Mcclain  MWN:[id number] DOB: July 08, 1951 DOA: 03/02/2021 PCP: Pcp, No   Brief Narrative:  70 year old male with a history of paroxysmal atrial fibrillation on apixaban, stroke, chronic respiratory failure with tracheostomy, hypertension, peripheral artery disease, COPD, lipidemia presenting after mechanical fall at his nursing facility, [facility]. X-ray was performed at the facility and revealed a left femur fracture.    Of note patient had prolonged hospitalization from 01/19/2021 to 02/02/2021 when he was found unresponsive secondary to seizures.  His hospitalization was prolonged secondary to requiring reintubation x3.  On the third episode, the patient had self extubated on 01/26/2021 and was subsequently reintubated on 01/27/2021.  He underwent tracheostomy on 01/29/2021.  [facility]. Repeatedly failed swallowing evaluation and underwent gastrostomy tube placement on 02/19/2021. Subsequently, the patient was discharged to [facility] on 03/01/2021.  Assessment & Plan:   Principal Problem:   Closed intertrochanteric fracture of hip, left, initial encounter [facility]) Active Problems:   Bilateral carotid artery disease (HCC)   Seizure (HCC)   Seizure disorder (HCC)   Paroxysmal atrial fibrillation (HCC)   Tracheostomy status (HCC)   Normocytic anemia   Mild protein-calorie malnutrition (HCC)   Transaminitis   Hypertension   Left comminuted proximal femur fracture -Ortho following - ORIF w/ L IM NAIL on 03/03/21 -PT/OT ongoing after surgery   Paroxysmal atrial fibrillation -Currently in sinus rhythm -Holding apixaban in preparation for surgery -Personally reviewed EKG--sinus rhythm, nonspecific T wave changes  Anemia - acute questionably postop -2 unit PRBC pending type and screen -No overt hematoma or ecchymosis over the bandage -No GI blood loss or other signs or symptoms of bleeding at this  point -Iron panel shows remarkably low iron at 16, will initiate 1 IV transfusion of iron and transition to p.o. thereafter-folate/B12 within normal limits  Chronic respiratory failure with hypoxia/COPD, at baseline -Patient has tracheostomy with trach collar oxygenation -Stable on trach collar -RT to follow   Failure to thrive, dysphagia -NPO, resume tube feeds per dietary recommendations   Seizure disorder -Continue Keppra 500 mg twice daily   Essential hypertension -Holding amlodipine and lisinopril/HCTZ due to soft blood pressure -Hypotension likely in the setting of narcotics -the need to restart home meds as narcotics are weaned down postoperatively   Depression/anxiety -Resume Seroquel, Klonopin   Carotid stenosis -At the time of his last hospital admission in May 2022, the patient was noted to have 80% right CCA stenosis -deemed unstable for intervention -resume apixaban when ok with ortho   Hyperlipidemia -Resume statin post op   DVT prophylaxis: Eliquis Code Status: DNR Family Communication: None present  Status is: Inpt  Dispo: The patient is from: Facility              Anticipated d/c is to: Facility              Anticipated d/c date is: 48 to 72 hours              Patient currently not medically stable for discharge  Consultants:  Ortho  Procedures:  ORIF - L IM NAIL - 03/03/21  Antimicrobials:  None indicated   Subjective: No acute issues or events overnight, patient states pain is moderately well controlled, somewhat anxious for PT today  Objective: Vitals:   03/04/21 2030 03/04/21 2043 03/04/21 2358 03/05/21 0313  BP: (!) 115/55     Pulse: 97 88 83 73  Resp: 18 18 18  16  Temp: 99.1 F (37.3 C)     TempSrc:      SpO2: 99% 97% 100% 97%  Weight:      Height:        Intake/Output Summary (Last 24 hours) at 03/05/2021 0734 Last data filed at 03/05/2021 1610 Gross per 24 hour  Intake 824.55 ml  Output 1200 ml  Net -375.45 ml    Filed  Weights   03/02/21 1656  Weight: 61 kg    Examination:  General:  Pleasantly resting in bed, No acute distress. HEENT:  Normocephalic atraumatic.  Sclerae nonicteric, noninjected.  Extraocular movements intact bilaterally. Neck: Tracheostomy site clean dry intact Lungs:  Clear to auscultate bilaterally without rhonchi, wheeze, or rales. Heart:  Regular rate and rhythm.  Without murmurs, rubs, or gallops. Abdomen:  Soft, nontender, nondistended.  PEG tube site clean dry intact Extremities: Without cyanosis, clubbing, edema, or obvious deformity.  Left postop bandage clean dry intact Vascular:  Dorsalis pedis and posterior tibial pulses palpable bilaterally. Skin:  Warm and dry, no erythema, no ulcerations.  Data Reviewed: I have personally reviewed following labs and imaging studies  CBC: Recent Labs  Lab 02/28/21 0020 03/02/21 1743 03/03/21 0502 03/04/21 0123 03/05/21 0250  WBC 7.1 11.9* 11.0* 12.2* 7.3  NEUTROABS  --  10.4*  --   --   --   HGB 10.6* 10.9* 9.7* 8.9* 6.7*  HCT 31.8* 34.2* 30.1* 26.8* 20.5*  MCV 94.4 98.6 97.1 94.0 95.3  PLT 191 252 231 212 184    Basic Metabolic Panel: Recent Labs  Lab 02/28/21 0020 03/02/21 1743 03/03/21 0502 03/04/21 0123 03/05/21 0250  NA 136 136 136 135 134*  K 4.0 4.2 4.1 3.8 3.2*  CL 103 99 102 101 99  CO2 GLUCOSE 118* 114* 113* 125* 146*  BUN 22 24* CREATININE 1.08 1.14 1.09 1.15 1.19  CALCIUM 8.3* 8.5* 8.5* 8.2* 8.0*  MG 1.9  --   --   --   --     GFR: Estimated Creatinine Clearance: 44 mL/min (by C-G formula based on SCr of 1.19 mg/dL). Liver Function Tests: Recent Labs  Lab 03/02/21 1743 03/03/21 0502  AST 45* 36  ALT 69* 60*  ALKPHOS 110 108  BILITOT 0.2* 0.5  PROT 7.0 6.1*  ALBUMIN 3.0* 2.8*    No results for input(s): LIPASE, AMYLASE in the last 168 hours. No results for input(s): AMMONIA in the last 168 hours. Coagulation Profile: Recent Labs  Lab 03/02/21 2112  INR 1.6*     Cardiac Enzymes: No results for input(s): CKTOTAL, CKMB, CKMBINDEX, TROPONINI in the last 168 hours. BNP (last 3 results) No results for input(s): PROBNP in the last 8760 hours. HbA1C: No results for input(s): HGBA1C in the last 72 hours. CBG: Recent Labs  Lab 03/04/21 1804 03/04/21 2007 03/05/21 0026 03/05/21 0424  GLUCAP 122* 125* 125* 153*   Lipid Profile: No results for input(s): CHOL, HDL, LDLCALC, TRIG, CHOLHDL, LDLDIRECT in the last 72 hours. Thyroid Function Tests: No results for input(s): TSH, T4TOTAL, FREET4, T3FREE, THYROIDAB in the last 72 hours. Anemia Panel: No results for input(s): VITAMINB12, FOLATE, FERRITIN, TIBC, IRON, RETICCTPCT in the last 72 hours. Sepsis Labs: No results for input(s): PROCALCITON, LATICACIDVEN in the last 168 hours.  Recent Results (from the past 240 hour(s))  Culture, Respiratory w Gram Stain     Status: None   Collection Time: 02/23/21 10:15 AM   Specimen: Tracheal Aspirate; Respiratory  Result Value Ref Range Status   Specimen Description TRACHEAL ASPIRATE  Final   Special Requests NONE  Final   Gram Stain   Final    MODERATE WBC PRESENT,BOTH PMN AND MONONUCLEAR MODERATE GRAM POSITIVE COCCI IN PAIRS MODERATE GRAM NEGATIVE RODS RARE GRAM POSITIVE RODS FEW SQUAMOUS EPITHELIAL CELLS PRESENT Performed at [facility], [address] [place], [place] [address]    Culture RARE PSEUDOMONAS AERUGINOSA  Final   Report Status 02/28/2021 FINAL  Final   Organism ID, Bacteria PSEUDOMONAS AERUGINOSA  Final      Susceptibility   Pseudomonas aeruginosa - MIC*    CEFTAZIDIME 4 SENSITIVE Sensitive     CIPROFLOXACIN <=0.25 SENSITIVE Sensitive     GENTAMICIN <=1 SENSITIVE Sensitive     IMIPENEM 1 SENSITIVE Sensitive     PIP/TAZO 8 SENSITIVE Sensitive     CEFEPIME 2 SENSITIVE Sensitive     * RARE PSEUDOMONAS AERUGINOSA  SARS CORONAVIRUS 2 (TAT 6-24 HRS) Nasopharyngeal Nasopharyngeal Swab     Status: None   Collection Time:  02/28/21 11:25 AM   Specimen: Nasopharyngeal Swab  Result Value Ref Range Status   SARS Coronavirus 2 NEGATIVE NEGATIVE Final    Comment: (NOTE) SARS-CoV-2 target nucleic acids are NOT DETECTED.  The SARS-CoV-2 RNA is generally detectable in upper and lower respiratory specimens during the acute phase of infection. Negative results do not preclude SARS-CoV-2 infection, do not rule out co-infections with other pathogens, and should not be used as the sole basis for treatment or other patient management decisions. Negative results must be combined with clinical observations, patient history, and epidemiological information. The expected result is Negative.  Fact Sheet for Patients: HairSlick.nohttps://www.fda.gov/media/138098/download  Fact Sheet for Healthcare Providers: quierodirigir.comhttps://www.fda.gov/media/138095/download  This test is not yet approved or cleared by the [place] FDA and  has been authorized for detection and/or diagnosis of SARS-CoV-2 by FDA under an Emergency Use Authorization (EUA). This EUA will remain  in effect (meaning this test can be used) for the duration of the COVID-19 declaration under Se ction 564(b)(1) of the Act, 21 U.S.C. section 360bbb-3(b)(1), unless the authorization is terminated or revoked sooner.  Performed at [facility], [address] [place], [place] [address]   Resp Panel by RT-PCR (Flu A&B, Covid) Nasopharyngeal Swab     Status: None   Collection Time: 03/02/21  9:00 PM   Specimen: Nasopharyngeal Swab; Nasopharyngeal(NP) swabs in vial transport medium  Result Value Ref Range Status   SARS Coronavirus 2 by RT PCR NEGATIVE NEGATIVE Final    Comment: (NOTE) SARS-CoV-2 target nucleic acids are NOT DETECTED.  The SARS-CoV-2 RNA is generally detectable in upper respiratory specimens during the acute phase of infection. The lowest concentration of SARS-CoV-2 viral copies this assay can detect is 138 copies/mL. A negative result does not preclude  SARS-Cov-2 infection and should not be used as the sole basis for treatment or other patient management decisions. A negative result may occur with  improper specimen collection/handling, submission of specimen other than nasopharyngeal swab, presence of viral mutation(s) within the areas targeted by this assay, and inadequate number of viral copies(<138 copies/mL). A negative result must be combined with clinical observations, patient history, and epidemiological information. The expected result is Negative.  Fact Sheet for Patients:  BloggerCourse.comhttps://www.fda.gov/media/152166/download  Fact Sheet for Healthcare Providers:  SeriousBroker.ithttps://www.fda.gov/media/152162/download  This test is no t yet approved or cleared by the [place] FDA and  has been authorized for detection and/or diagnosis of SARS-CoV-2 by  FDA under an Emergency Use Authorization (EUA). This EUA will remain  in effect (meaning this test can be used) for the duration of the COVID-19 declaration under Section 564(b)(1) of the Act, 21 U.S.C.section 360bbb-3(b)(1), unless the authorization is terminated  or revoked sooner.       Influenza A by PCR NEGATIVE NEGATIVE Final   Influenza B by PCR NEGATIVE NEGATIVE Final    Comment: (NOTE) The Xpert Xpress SARS-CoV-2/FLU/RSV plus assay is intended as an aid in the diagnosis of influenza from Nasopharyngeal swab specimens and should not be used as a sole basis for treatment. Nasal washings and aspirates are unacceptable for Xpert Xpress SARS-CoV-2/FLU/RSV testing.  Fact Sheet for Patients: BloggerCourse.com  Fact Sheet for Healthcare Providers: SeriousBroker.it  This test is not yet approved or cleared by the [place] FDA and has been authorized for detection and/or diagnosis of SARS-CoV-2 by FDA under an Emergency Use Authorization (EUA). This EUA will remain in effect (meaning this test can be used) for the duration of  the COVID-19 declaration under Section 564(b)(1) of the Act, 21 U.S.C. section 360bbb-3(b)(1), unless the authorization is terminated or revoked.  Performed at [facility], [address], [place], [place] [address]   Surgical pcr screen     Status: None   Collection Time: 03/03/21 12:19 PM   Specimen: Nasal Mucosa; Nasal Swab  Result Value Ref Range Status   MRSA, PCR NEGATIVE NEGATIVE Final   Staphylococcus aureus NEGATIVE NEGATIVE Final    Comment: (NOTE) The Xpert SA Assay (FDA approved for NASAL specimens in patients 51 years of age and older), is one component of a comprehensive surveillance program. It is not intended to diagnose infection nor to guide or monitor treatment. Performed at [facility], [address], [place], [place] [address]           Radiology Studies: DG C-Arm 1-60 Min  Result Date: 03/03/2021 CLINICAL DATA:  Left femur IM nail. EXAM: LEFT FEMUR 2 VIEWS; DG C-ARM 1-60 MIN COMPARISON:  Hip radiograph yesterday. FINDINGS: Four fluoroscopic spot views of the left proximal femur obtained in the operating room in frontal and lateral projection. Intramedullary nail with distal locking and trans trochanteric screw traverses intertrochanteric femur fracture. Fluoroscopy time 1 minutes 37 seconds. IMPRESSION: Fluoroscopic spot views during left femur fracture ORIF. Electronically Signed   By: Narda Rutherford M.D.   On: 03/03/2021 16:47   DG HIP UNILAT WITH PELVIS 2-3 VIEWS LEFT  Result Date: 03/03/2021 CLINICAL DATA:  ORIF left hip fracture. EXAM: DG HIP (WITH OR WITHOUT PELVIS) 2-3V LEFT COMPARISON:  Radiograph yesterday. FINDINGS: Intramedullary nail with trans trochanteric and distal locking screws traverse intertrochanteric femur fracture. The fracture is in improved alignment compared to preoperative imaging. Persistent displacement of the lesser trochanter. Recent postsurgical change includes air and edema in the soft tissues. Prior intramedullary  nail fixation of the right femur is partially included with intact hardware where visualized. IMPRESSION: ORIF intertrochanteric femur fracture, in improved alignment compared to preoperative imaging. Electronically Signed   By: Narda Rutherford M.D.   On: 03/03/2021 17:54   DG FEMUR MIN 2 VIEWS LEFT  Result Date: 03/03/2021 CLINICAL DATA:  Left femur IM nail. EXAM: LEFT FEMUR 2 VIEWS; DG C-ARM 1-60 MIN COMPARISON:  Hip radiograph yesterday. FINDINGS: Four fluoroscopic spot views of the left proximal femur obtained in the operating room in frontal and lateral projection. Intramedullary nail with distal locking and trans trochanteric screw traverses intertrochanteric femur fracture. Fluoroscopy time 1 minutes  37 seconds. IMPRESSION: Fluoroscopic spot views during left femur fracture ORIF. Electronically Signed   By: Narda Rutherford M.D.   On: 03/03/2021 16:47    Scheduled Meds:  sodium chloride   Intravenous Once   albuterol  2.5 mg Nebulization Daily   apixaban  5 mg Per Tube BID   clonazePAM  0.5 mg Per Tube QHS   feeding supplement (PROSource TF)  45 mL Per Tube Daily   free water  100 mL Per Tube Q4H   levETIRAcetam  1,000 mg Per Tube BID   multivitamin with minerals  1 tablet Per Tube Daily   pantoprazole sodium  40 mg Per Tube Daily   QUEtiapine  100 mg Per Tube BID   thiamine  100 mg Per Tube Daily   vitamin B-12  500 mcg Per Tube Daily   Continuous Infusions:  feeding supplement (OSMOLITE 1.5 CAL) 55 mL/hr at 03/05/21 0707     LOS: 3 days   Time spent:  Azucena Fallen, DO [facility]  If 7PM-7AM, please contact night-coverage [email or web address]  03/05/2021, 7:34 AM

## 2021-03-06 ENCOUNTER — Encounter (HOSPITAL_COMMUNITY): Payer: Self-pay | Admitting: Orthopedic Surgery

## 2021-03-06 ENCOUNTER — Ambulatory Visit: Payer: Medicare Other | Admitting: Vascular Surgery

## 2021-03-06 LAB — BPAM RBC
Blood Product Expiration Date: 202207162359
Blood Product Expiration Date: 202207222359
ISSUE DATE / TIME: 202206201155
ISSUE DATE / TIME: 202206201522
Unit Type and Rh: 5100
Unit Type and Rh: 5100

## 2021-03-06 LAB — BASIC METABOLIC PANEL
Anion gap: 12 (ref 5–15)
BUN: 15 mg/dL (ref 8–23)
CO2: 27 mmol/L (ref 22–32)
Calcium: 7.9 mg/dL — ABNORMAL LOW (ref 8.9–10.3)
Chloride: 98 mmol/L (ref 98–111)
Creatinine, Ser: 0.97 mg/dL (ref 0.61–1.24)
GFR, Estimated: >60 mL/min (ref >60)
Glucose, Bld: 144 mg/dL — ABNORMAL HIGH (ref 70–99)
Potassium: 3.6 mmol/L (ref 3.5–5.1)
Sodium: 137 mmol/L (ref 135–145)

## 2021-03-06 LAB — GLUCOSE, CAPILLARY
Glucose-Capillary: 160 mg/dL — ABNORMAL HIGH (ref 70–99)
Glucose-Capillary: 134 mg/dL — ABNORMAL HIGH (ref 70–99)
Glucose-Capillary: 140 mg/dL — ABNORMAL HIGH (ref 70–99)
Glucose-Capillary: 121 mg/dL — ABNORMAL HIGH (ref 70–99)
Glucose-Capillary: 121 mg/dL — ABNORMAL HIGH (ref 70–99)
Glucose-Capillary: 119 mg/dL — ABNORMAL HIGH (ref 70–99)

## 2021-03-06 LAB — TYPE AND SCREEN
ABO/RH(D): O POS
Antibody Screen: NEGATIVE
Unit division: 0
Unit division: 0

## 2021-03-06 LAB — CBC
HCT: 30.6 % — ABNORMAL LOW (ref 39.0–52.0)
Hemoglobin: 10.2 g/dL — ABNORMAL LOW (ref 13.0–17.0)
MCH: 30.6 pg (ref 26.0–34.0)
MCHC: 33.3 g/dL (ref 30.0–36.0)
MCV: 91.9 fL (ref 80.0–100.0)
Platelets: 181 10*3/uL (ref 150–400)
RBC: 3.33 MIL/uL — ABNORMAL LOW (ref 4.22–5.81)
RDW: 14.6 % (ref 11.5–15.5)
WBC: 7.3 10*3/uL (ref 4.0–10.5)
nRBC: 0 % (ref 0.0–0.2)

## 2021-03-06 MED ORDER — FERROUS SULFATE 300 (60 FE) MG/5ML PO SYRP
60.0000 mg | ORAL_SOLUTION | Freq: Three times a day (TID) | ORAL | Status: DC
  Administered 2021-03-06 – 2021-03-07 (×4): 60 mg
  Filled 2021-03-06 (×6): qty 5

## 2021-03-06 MED ORDER — HYDROCODONE-ACETAMINOPHEN 5-325 MG PO TABS
1.0000 | ORAL_TABLET | ORAL | Status: DC | PRN
  Administered 2021-03-07: 2 via ORAL
  Filled 2021-03-06: qty 2

## 2021-03-06 MED ORDER — HYDROCODONE-ACETAMINOPHEN 5-325 MG PO TABS: 1.0000 | ORAL_TABLET | Freq: Four times a day (QID) | ORAL | 0 refills | Status: DC | PRN

## 2021-03-06 NOTE — Progress Notes (Signed)
Physical Therapy Treatment Patient Details Name: Adam Mcclain MRN: [id number] DOB: [birth year] Today's Date: 03/06/2021    History of Present Illness Adam Mcclain is a 70 y.o. male who had an unwitnessed fall at his LTACH, landing on his left side; L intertrochanteric fx, s/p surgical fixation on 6/18, WBAT LLE; Pt comes from [facility] where he was undergoing rehab after hospital admission in May for seizures, R parietal infarcts, compromised respiratory status; with medical history significant of paroxysmal Afib, chronic respiratory failure with hypoxia, anxiety, OA, chronic back pain, COPD, GERD, elderly testis, HTN, PAD, seizure disorder, status post gastrostomy and tracheostomy.    PT Comments    Pt received in supine, agreeable to therapy session with encouragement and with fair participation and tolerance for UE/LE exercises in supine. Attempted to assist pt with transfer training/seated activity for strengthening but pt refusing after partial transition to EOB. Pt needing AAROM for all LLE mobility tasks and self-limiting ROM due to pain. Pt continues to benefit from PT services to progress toward functional mobility goals.    Follow Up Recommendations  LTACH;Other (comment) (return to [facility] to continue rehab efforts)     Equipment Recommendations  Rolling walker with 5" wheels;3in1 (PT);Other (comment) (defer to [facility])    Recommendations for Other Services       Precautions / Restrictions Precautions Precautions: Fall;Other (comment) Precaution Comments: Trach Restrictions Weight Bearing Restrictions: Yes LLE Weight Bearing: Weight bearing as tolerated    Mobility  Bed Mobility Overal bed mobility: Needs Assistance Bed Mobility: Rolling Rolling: Mod assist;+2 for physical assistance   Supine to sit: +2 for physical assistance;+2 for safety/equipment;Max assist     General bed mobility comments: Used bed pad to support hips and thighs; 2 person Max assist to assist LEs off  of the bed and partially sit up but halfway up, pt refusing and insists on going back to supine. Pt tended to hold to the bedrail with RUE, effectively pulling himself down, and refusing to remove hand and push up on bed to sit up due to LLE pain.    Transfers                    Ambulation/Gait                 Stairs             Wheelchair Mobility    Modified Rankin (Stroke Patients Only)       Balance Overall balance assessment: Needs assistance     Sitting balance - Comments: pt refusing EOB despite heavy encouragement, using RUE to pull on bed rail to remain in supine                                    Cognition Arousal/Alertness: Awake/alert Behavior During Therapy: [facility] for tasks assessed/performed;Anxious Overall Cognitive Status: No family/caregiver present to determine baseline cognitive functioning                                 General Comments: Very anxious and internally distracted with anticipation of pain. Pt very self-limiting due to pain and refusing EOB despite instruction on need to reposition and work on strengthening/balance.      Exercises General Exercises - Lower Extremity Ankle Circles/Pumps: AROM;Both;10 reps;Supine Short Arc Quad: AAROM;Both;10 reps;Supine Heel Slides: AAROM;Both;10 reps;Supine (self-limiting LLE ROM  due to pain) Hip ABduction/ADduction: AAROM;Both;10 reps;Supine Straight Leg Raises: AAROM;Both;10 reps;Supine (pain limiting LLE ROm) Other Exercises Other Exercises: supine BUE AROM: wrist flex/ext, elbow flex/ext, chest press, arm bicycle x10-15 reps ea    General Comments General comments (skin integrity, edema, etc.): 28% O2 trach collar, SpO2 briefly desat when respiratory therapist performed suction for his trach(significant secretions noted per RT) but also tends to desat when gripping bed rail with R hand, when fingers relaxed signal improved and reading >93%       Pertinent Vitals/Pain Pain Assessment: Faces Faces Pain Scale: Hurts whole lot Pain Location: Bil LEs, but especially L hip Pain Descriptors / Indicators: Guarding;Grimacing;Moaning Pain Intervention(s): Limited activity within patient's tolerance;Monitored during session;Repositioned;Patient requesting pain meds-RN notified    Home Living                      Prior Function            PT Goals (current goals can now be found in the care plan section) Acute Rehab PT Goals Patient Stated Goal: Did not specifically state PT Goal Formulation: Patient unable to participate in goal setting Time For Goal Achievement: 03/18/21 Progress towards PT goals: Progressing toward goals (slow progress)    Frequency    Min 2X/week      PT Plan Current plan remains appropriate    Co-evaluation              AM-PAC PT "6 Clicks" Mobility   Outcome Measure  Help needed turning from your back to your side while in a flat bed without using bedrails?: A Lot Help needed moving from lying on your back to sitting on the side of a flat bed without using bedrails?: Total Help needed moving to and from a bed to a chair (including a wheelchair)?: Total Help needed standing up from a chair using your arms (e.g., wheelchair or bedside chair)?: Total Help needed to walk in hospital room?: Total Help needed climbing 3-5 steps with a railing? : Total 6 Click Score: 7    End of Session Equipment Utilized During Treatment: Other (comment);Oxygen (bed pads) Activity Tolerance: Patient limited by pain Patient left: in bed;with call bell/phone within reach;with bed alarm set;Other (comment);with SCD's reapplied (bed to semi-chair position) Nurse Communication: Mobility status;Patient requests pain meds PT Visit Diagnosis: Other abnormalities of gait and mobility (R26.89);Repeated falls (R29.6);Pain Pain - Right/Left: Left Pain - part of body: Leg     Time: 9373-4287 PT Time  Calculation (min) (ACUTE ONLY): 17 min  Charges:  $Therapeutic Exercise: 8-22 mins                     Adam Obryan P., PTA Acute Rehabilitation Services Pager: [phone] Office: [phone]    Angus Palms 03/06/2021, 6:53 PM

## 2021-03-06 NOTE — NC FL2 (Signed)
[place] MEDICAID FL2 LEVEL OF CARE SCREENING TOOL     IDENTIFICATION  Patient Name: Adam Mcclain Birthdate: 1951/04/04 Sex: male Admission Date (Current Location): 03/02/2021  [facility] and [place] Number:  Producer, television/film/video and Address:  [facility], [address], [place], [place] [address]      Provider Number: [id number]  Attending Physician Name and Address:  Azucena Fallen, MD  Relative Name and Phone Number:       Current Level of Care: Hospital Recommended Level of Care: Skilled Nursing Facility Prior Approval Number:    Date Approved/Denied:   PASRR Number: [id number]  Discharge Plan: SNF    Current Diagnoses: Patient Active Problem List   Diagnosis Date Noted   Closed intertrochanteric fracture of hip, left, initial encounter (HCC) 03/02/2021   Normocytic anemia 03/02/2021   Mild protein-calorie malnutrition (HCC) 03/02/2021   Transaminitis 03/02/2021   Hypertension    Acute on chronic respiratory failure with hypoxia (HCC)    Seizure disorder (HCC)    Acute stroke due to ischemia [facility])    Paroxysmal atrial fibrillation (HCC)    Tracheostomy status (HCC)    Protein-calorie malnutrition, severe 01/26/2021   Cerebral thrombosis with cerebral infarction 01/24/2021   Pressure injury of skin 01/23/2021   Seizure (HCC) 01/19/2021   Bilateral carotid artery disease (HCC) 06/26/2016   Carotid artery disease (HCC) 05/10/2016   Cold hands 04/23/2013   Peripheral vascular disease, unspecified (HCC) 03/12/2013   Atherosclerosis of native arteries of extremity with intermittent claudication (HCC) 03/12/2013   Lumbago 03/12/2013    Orientation RESPIRATION BLADDER Height & Weight     Self  Tracheostomy, O2 (TC with 5l oxygen, FI02 @ 28%) External catheter Weight: 61 kg Height:  5\' 2"  (157.5 cm)  BEHAVIORAL SYMPTOMS/MOOD NEUROLOGICAL BOWEL NUTRITION STATUS      Continent Diet, Feeding tube (Refer to d/c summary. Pt with a G-  tube.)  AMBULATORY STATUS COMMUNICATION OF NEEDS Skin   Extensive Assist Verbally Surgical wounds (s/p IMN to L  intertrochantric fracture, 6/18)                       Personal Care Assistance Level of Assistance  Bathing, Feeding, Dressing Bathing Assistance: Maximum assistance Feeding assistance: Maximum assistance Dressing Assistance: Maximum assistance     Functional Limitations Info  Sight, Hearing, Speech Sight Info: Adequate Hearing Info: Adequate Speech Info: Adequate    SPECIAL CARE FACTORS FREQUENCY  PT (By licensed PT), OT (By licensed OT)     PT Frequency: 5x /week, evaluate  and treat OT Frequency: 5x /week, evaluate  and treat            Contractures Contractures Info: Not present    Additional Factors Info  Code Status, Allergies Code Status Info: DNR Allergies Info: No Known Allergies           Current Medications (03/06/2021):  This is the current hospital active medication list Current Facility-Administered Medications  Medication Dose Route Frequency Provider Last Rate Last Admin   0.9 %  sodium chloride infusion (Manually program via Guardrails IV Fluids)   Intravenous Once 03/08/2021 T, NP       acetaminophen (TYLENOL) tablet 650 mg  650 mg Oral Q6H PRN Audrea Muscat K, PA-C   650 mg at 03/05/21 0013   Or   acetaminophen (TYLENOL) suppository 650 mg  650 mg Rectal Q6H PRN 03/07/21, PA-C  albuterol (PROVENTIL) (2.5 MG/3ML) 0.083% nebulizer solution 2.5 mg  2.5 mg Nebulization Daily Azucena Fallen, MD   2.5 mg at 03/06/21 0743   apixaban (ELIQUIS) tablet 5 mg  5 mg Per Tube BID Tamera Reason, RPH   5 mg at 03/06/21 0845   clonazePAM (KLONOPIN) tablet 0.5 mg  0.5 mg Per Tube QHS Azucena Fallen, MD   0.5 mg at 03/05/21 2226   feeding supplement (OSMOLITE 1.5 CAL) liquid 1,000 mL  1,000 mL Per Tube Continuous Azucena Fallen, MD 55 mL/hr at 03/05/21 0707 Rate Change at 03/05/21 0707   feeding supplement (PROSource  TF) liquid 45 mL  45 mL Per Tube Daily Azucena Fallen, MD   45 mL at 03/06/21 0846   ferrous sulfate 300 (60 Fe) MG/5ML syrup 60 mg of iron  60 mg of iron Per Tube TID Azucena Fallen, MD   60 mg of iron at 03/06/21 0847   free water 100 mL  100 mL Per Tube Q4H Azucena Fallen, MD   100 mL at 03/06/21 0846   HYDROcodone-acetaminophen (NORCO/VICODIN) 5-325 MG per tablet 1-2 tablet  1-2 tablet Oral Q4H PRN Janine Ores K, PA-C       HYDROmorphone (DILAUDID) injection 0.5 mg  0.5 mg Intravenous Q4H PRN Azucena Fallen, MD       ipratropium-albuterol (DUONEB) 0.5-2.5 (3) MG/3ML nebulizer solution 3 mL  3 mL Nebulization Q6H PRN Azucena Fallen, MD       levETIRAcetam (KEPPRA) 100 MG/ML solution 1,000 mg  1,000 mg Per Tube BID Azucena Fallen, MD   1,000 mg at 03/06/21 0845   multivitamin with minerals tablet 1 tablet  1 tablet Per Tube Daily Azucena Fallen, MD   1 tablet at 03/06/21 0845   ondansetron (ZOFRAN) tablet 4 mg  4 mg Oral Q6H PRN Janine Ores K, PA-C       Or   ondansetron [facility]) injection 4 mg  4 mg Intravenous Q6H PRN Janine Ores K, PA-C   4 mg at 03/02/21 2148   pantoprazole sodium (PROTONIX) 40 mg/20 mL oral suspension 40 mg  40 mg Per Tube Daily Azucena Fallen, MD   40 mg at 03/06/21 0846   QUEtiapine (SEROQUEL) tablet 100 mg  100 mg Per Tube BID Azucena Fallen, MD   100 mg at 03/06/21 0845   thiamine tablet 100 mg  100 mg Per Tube Daily Azucena Fallen, MD   100 mg at 03/06/21 0845   vitamin B-12 (CYANOCOBALAMIN) tablet 500 mcg  500 mcg Per Tube Daily Azucena Fallen, MD   500 mcg at 03/06/21 0845     Discharge Medications: Please see discharge summary for a list of discharge medications.  Relevant Imaging Results:  Relevant Lab Results:   Additional Information SS# [id number]  Epifanio Lesches, RN

## 2021-03-06 NOTE — Consult Note (Signed)
   [facility] Inpatient Consult   03/06/2021  Adam Mcclain 03-May-1951 [id number] [facility] [[facility]]  Accountable Care Organization [ACO] Patient: Medicare CMS DCE  Electronic Medical Record reveals patient was admitted from a skilled nursing facility, [facility]. Patient  with traditional Medicare for any known or needs for transitional care needs for returning to post facility care or complex disease management.  For questions or referrals, please contact:   Charlesetta Shanks, RN BSN CCM [facility]  [phone] business mobile phone Toll free office [phone]  Fax number: [phone] [email or web address]

## 2021-03-06 NOTE — Progress Notes (Signed)
Subjective: 3 Days Post-Op s/p Procedure(s): INTRAMEDULLARY (IM) NAIL INTERTROCHANTRIC  States hip hurts "real bad" this morning. No other complaints.   Objective:  PE: VITALS:   Vitals:   03/06/21 0328 03/06/21 0430 03/06/21 0738 03/06/21 0741  BP: 123/81  132/73   Pulse: 78 84 88 88  Resp: 17 20 20 20   Temp: 98.7 F (37.1 C)  99.7 F (37.6 C)   TempSrc: Oral  Oral   SpO2: 100% 97% 94% 96%  Weight:      Height:        Sensation intact distally Intact pulses distally Dorsiflexion/Plantar flexion intact Incision: dressing C/D/I Compartments soft  LABS  Results for orders placed or performed during the hospital encounter of 03/02/21 (from the past 24 hour(s))  Glucose, capillary     Status: Abnormal   Collection Time: 03/05/21  4:59 PM  Result Value Ref Range   Glucose-Capillary 125 (H) 70 - 99 mg/dL  Glucose, capillary     Status: Abnormal   Collection Time: 03/05/21  7:44 PM  Result Value Ref Range   Glucose-Capillary 137 (H) 70 - 99 mg/dL  Glucose, capillary     Status: Abnormal   Collection Time: 03/05/21 11:35 PM  Result Value Ref Range   Glucose-Capillary 181 (H) 70 - 99 mg/dL  Glucose, capillary     Status: Abnormal   Collection Time: 03/06/21  3:39 AM  Result Value Ref Range   Glucose-Capillary 134 (H) 70 - 99 mg/dL  Basic metabolic panel     Status: Abnormal   Collection Time: 03/06/21  3:49 AM  Result Value Ref Range   Sodium 137 135 - 145 mmol/L   Potassium 3.6 3.5 - 5.1 mmol/L   Chloride 98 98 - 111 mmol/L   CO2 27 22 - 32 mmol/L   Glucose, Bld 144 (H) 70 - 99 mg/dL   BUN 15 8 - 23 mg/dL   Creatinine, Ser 03/08/21 0.61 - 1.24 mg/dL   Calcium 7.9 (L) 8.9 - 10.3 mg/dL   GFR, Estimated 4.81 >85 mL/min   Anion gap 12 5 - 15  CBC     Status: Abnormal   Collection Time: 03/06/21  3:49 AM  Result Value Ref Range   WBC 7.3 4.0 - 10.5 K/uL   RBC 3.33 (L) 4.22 - 5.81 MIL/uL   Hemoglobin 10.2 (L) 13.0 - 17.0 g/dL   HCT 03/08/21 (L) 14.9 - 70.2 %   MCV  91.9 80.0 - 100.0 fL   MCH 30.6 26.0 - 34.0 pg   MCHC 33.3 30.0 - 36.0 g/dL   RDW 63.7 85.8 - 85.0 %   Platelets 181 150 - 400 K/uL   nRBC 0.0 0.0 - 0.2 %  Glucose, capillary     Status: Abnormal   Collection Time: 03/06/21  7:37 AM  Result Value Ref Range   Glucose-Capillary 140 (H) 70 - 99 mg/dL    No results found.  Assessment/Plan: Intertrochanteric L hip fracture  3 Days Post-Op s/p Procedure(s): INTRAMEDULLARY (IM) NAIL INTERTROCHANTRIC  Weightbearing: WBAT LLE Insicional and dressing care: Reinforce dressings as needed VTE prophylaxis: home eliquis restarted Pain control: d/c'd oxycodone, he's only taken a few doses. Changed to hydrocodone, can use q 4 if needed.  Follow - up plan: with Dr. 03/08/21 in 2 weeks Dispo: [facility] for discharge from ortho standpoint when SNF bed available.   Contact information:   Weekdays 8-5 05-20-2006, Janine Ores [place] A fter hours and holidays please check [email or web address] for  group call information for [facility]  Armida Sans 03/06/2021, 10:29 AM

## 2021-03-06 NOTE — Progress Notes (Signed)
Physical Therapy Note  Notified by Marylene Land, RNCM with TOC, that Mr. Vader was in fact undergoing rehab at [facility]; Have updated DC recommendations and put an addendum in the PT evaluation note;     03/06/21 0700   PT - Assessment/Plan  PT Plan Discharge plan needs to be updated (to correct my earlier misunderstanding)  PT Visit Diagnosis Other abnormalities of gait and mobility (R26.89);Repeated falls (R29.6);Pain  Pain - Right/Left Left  Pain - part of body Leg  PT Frequency (ACUTE ONLY) Min 2X/week (while in hospital during acute care stay; Recommend higher frequency once at post-acute rehab)  Follow Up Recommendations SNF;Other (comment) (Return to [facility] to continue rehab efforts)  PT equipment Rolling walker with 5" wheels;3in1 (PT);Other (comment) (and will also defer to post-acute rehab)    Van Clines, PT  Acute Rehabilitation Services Pager [phone] Office [phone]

## 2021-03-06 NOTE — Progress Notes (Signed)
PROGRESS NOTE    Adam Mcclain  ZOX:[id number] DOB: 1951/09/10 DOA: 03/02/2021 PCP: Pcp, No   Brief Narrative:  70 year old male with a history of paroxysmal atrial fibrillation on apixaban, stroke, chronic respiratory failure with tracheostomy, hypertension, peripheral artery disease, COPD, lipidemia presenting after mechanical fall at [facility]. X-ray was performed at the facility and revealed a left femur fracture.    Of note patient had prolonged hospitalization from 01/19/2021 to 02/02/2021 when he was found unresponsive secondary to seizures.  His hospitalization was prolonged secondary to requiring reintubation x3.  On the third episode, the patient had self extubated on 01/26/2021 and was subsequently reintubated on 01/27/2021.  He underwent tracheostomy on 01/29/2021.  [facility]. Repeatedly failed swallowing evaluation and underwent gastrostomy tube placement on 02/19/2021. Subsequently, the [facility].  Assessment & Plan:   Principal Problem:   Closed intertrochanteric fracture of hip, left, initial encounter [facility](HCC) Active Problems:   Bilateral carotid artery disease (HCC)   Seizure (HCC)   Seizure disorder (HCC)   Paroxysmal atrial fibrillation (HCC)   Tracheostomy status (HCC)   Normocytic anemia   Mild protein-calorie malnutrition (HCC)   Transaminitis   Hypertension   Left comminuted proximal femur fracture -Ortho following - ORIF w/ L IM NAIL on 03/03/21 -PT/OT ongoing after surgery - return to SNF in the next 24-48h   Anemia - acute questionably postop - hemoglobin 6.7 on 6-22 - 2 unit PRBC transfused - repeat hemoglobin 10.2 today -No overt hematoma or ecchymosis over the bandage; FOBT pending to rule out GI bleed -No GI blood loss or other signs or symptoms of bleeding at this point so far - monitor labs for an additional 24h to ensure  stabilization -Iron panel shows remarkably low iron at 16, s/p 1 IV transfusion of iron x1 and continue PEG liquid iron TID thereafter - folate/B12 within normal limits  Paroxysmal atrial fibrillation -Currently in sinus rhythm -Apixaban resumed postoperatively - low threshold to stop if FOBT positive or hgb downtrending again tomorrow  Chronic respiratory failure with hypoxia/COPD, at baseline -Patient has tracheostomy with trach collar oxygenation -Stable on trach collar, deep suction as needed -RT to follow   Failure to thrive, dysphagia -NPO, continue tube feeds per dietary, patient remains malnourished BMI 24 with temporal wasting   Seizure disorder -Continue Keppra 500 mg twice daily   Essential hypertension -Holding amlodipine and lisinopril/HCTZ due to borderline hypotension/normotension -Hypotension likely in the setting of narcotics -likely need to restart home meds as narcotics are weaned down postoperatively   Depression/anxiety -Resume Seroquel, Klonopin   Carotid stenosis -At the time of his last hospital admission in May 2022, the patient was noted to have 80% right CCA stenosis -deemed unstable for intervention -Continue Eliquis   Hyperlipidemia -Resume statin post op   DVT prophylaxis: Eliquis Code Status: DNR Family Communication: None present  Status is: Inpt  Dispo: The patient is from: Facility              Anticipated d/c is to: Facility              Anticipated d/c date is: 24-48 hours              Patient currently not medically stable for discharge given ongoing workup for anemia as above -if hemoglobin stabilizes, likely disposition back to facility on the 22nd or 23rd.  Consultants:  Ortho  Procedures:  ORIF - L IM NAIL - 03/03/21  Antimicrobials:  None indicated   Subjective: No acute issues or events overnight, patient states pain is moderately well controlled, somewhat somnolent but easily arousable, denies nausea vomiting diarrhea  constipation headache fevers or chills.  Objective: Vitals:   03/06/21 0328 03/06/21 0430 03/06/21 0738 03/06/21 0741  BP: 123/81  132/73   Pulse: 78 84 88 88  Resp: 17 20 20 20   Temp: 98.7 F (37.1 C)  99.7 F (37.6 C)   TempSrc: Oral  Oral   SpO2: 100% 97% 94% 96%  Weight:      Height:        Intake/Output Summary (Last 24 hours) at 03/06/2021 0750 Last data filed at 03/06/2021 0559 Gross per 24 hour  Intake 3597.58 ml  Output 1350 ml  Net 2247.58 ml    Filed Weights   03/02/21 1656  Weight: 61 kg    Examination:  General:  Pleasantly resting in bed, No acute distress. HEENT:  Normocephalic atraumatic.  Sclerae nonicteric, noninjected.  Extraocular movements intact bilaterally. Neck: Tracheostomy site clean dry intact Lungs:  Clear to auscultate bilaterally without rhonchi, wheeze, or rales. Heart:  Regular rate and rhythm.  Without murmurs, rubs, or gallops. Abdomen:  Soft, nontender, nondistended.  PEG tube site clean dry intact Extremities: Without cyanosis, clubbing, edema, or obvious deformity.  Left postop bandage clean dry intact Vascular:  Dorsalis pedis and posterior tibial pulses palpable bilaterally. Skin:  Warm and dry, no erythema, no ulcerations.  Data Reviewed: I have personally reviewed following labs and imaging studies  CBC: Recent Labs  Lab 03/02/21 1743 03/03/21 0502 03/04/21 0123 03/05/21 0250 03/06/21 0349  WBC 11.9* 11.0* 12.2* 7.3 7.3  NEUTROABS 10.4*  --   --   --   --   HGB 10.9* 9.7* 8.9* 6.7* 10.2*  HCT 34.2* 30.1* 26.8* 20.5* 30.6*  MCV 98.6 97.1 94.0 95.3 91.9  PLT 252 231 212 184 181    Basic Metabolic Panel: Recent Labs  Lab 02/28/21 0020 03/02/21 1743 03/03/21 0502 03/04/21 0123 03/05/21 0250 03/06/21 0349  NA 136 136 136 135 134* 137  K 4.0 4.2 4.1 3.8 3.2* 3.6  CL 103 99 102 101 99 98  CO2 26 28 29 26 27 27   GLUCOSE 118* 114* 113* 125* 146* 144*  BUN 22 24* 20 15 14 15   CREATININE 1.08 1.14 1.09 1.15 1.19  0.97  CALCIUM 8.3* 8.5* 8.5* 8.2* 8.0* 7.9*  MG 1.9  --   --   --   --   --     GFR: Estimated Creatinine Clearance: 53.9 mL/min (by C-G formula based on SCr of 0.97 mg/dL). Liver Function Tests: Recent Labs  Lab 03/02/21 1743 03/03/21 0502  AST 45* 36  ALT 69* 60*  ALKPHOS 110 108  BILITOT 0.2* 0.5  PROT 7.0 6.1*  ALBUMIN 3.0* 2.8*    No results for input(s): LIPASE, AMYLASE in the last 168 hours. No results for input(s): AMMONIA in the last 168 hours. Coagulation Profile: Recent Labs  Lab 03/02/21 2112  INR 1.6*    Cardiac Enzymes: No results for input(s): CKTOTAL, CKMB, CKMBINDEX, TROPONINI in the last 168 hours. BNP (last 3 results) No results for input(s): PROBNP in the last 8760 hours. HbA1C: No results for input(s): HGBA1C in the last 72 hours. CBG: Recent Labs  Lab 03/05/21 1659 03/05/21 1944 03/05/21 2335 03/06/21 0339 03/06/21 0737  GLUCAP 125* 137* 181* 134* 140*    Lipid Profile: No  results for input(s): CHOL, HDL, LDLCALC, TRIG, CHOLHDL, LDLDIRECT in the last 72 hours. Thyroid Function Tests: No results for input(s): TSH, T4TOTAL, FREET4, T3FREE, THYROIDAB in the last 72 hours. Anemia Panel: Recent Labs    03/05/21 0940  VITAMINB12 223  FOLATE 22.4  FERRITIN 602*  TIBC 162*  IRON 16*  RETICCTPCT 3.1   Sepsis Labs: No results for input(s): PROCALCITON, LATICACIDVEN in the last 168 hours.  Recent Results (from the past 240 hour(s))  SARS CORONAVIRUS 2 (TAT 6-24 HRS) Nasopharyngeal Nasopharyngeal Swab     Status: None   Collection Time: 02/28/21 11:25 AM   Specimen: Nasopharyngeal Swab  Result Value Ref Range Status   SARS Coronavirus 2 NEGATIVE NEGATIVE Final    Comment: (NOTE) SARS-CoV-2 target nucleic acids are NOT DETECTED.  The SARS-CoV-2 RNA is generally detectable in upper and lower respiratory specimens during the acute phase of infection. Negative results do not preclude SARS-CoV-2 infection, do not rule out co-infections  with other pathogens, and should not be used as the sole basis for treatment or other patient management decisions. Negative results must be combined with clinical observations, patient history, and epidemiological information. The expected result is Negative.  Fact Sheet for Patients: HairSlick.no  Fact Sheet for Healthcare Providers: quierodirigir.com  This test is not yet approved or cleared by the [place] FDA and  has been authorized for detection and/or diagnosis of SARS-CoV-2 by FDA under an Emergency Use Authorization (EUA). This EUA will remain  in effect (meaning this test can be used) for the duration of the COVID-19 declaration under Se ction 564(b)(1) of the Act, 21 U.S.C. section 360bbb-3(b)(1), unless the authorization is terminated or revoked sooner.  Performed at [facility], [address] [place], [place] [address]   Resp Panel by RT-PCR (Flu A&B, Covid) Nasopharyngeal Swab     Status: None   Collection Time: 03/02/21  9:00 PM   Specimen: Nasopharyngeal Swab; Nasopharyngeal(NP) swabs in vial transport medium  Result Value Ref Range Status   SARS Coronavirus 2 by RT PCR NEGATIVE NEGATIVE Final    Comment: (NOTE) SARS-CoV-2 target nucleic acids are NOT DETECTED.  The SARS-CoV-2 RNA is generally detectable in upper respiratory specimens during the acute phase of infection. The lowest concentration of SARS-CoV-2 viral copies this assay can detect is 138 copies/mL. A negative result does not preclude SARS-Cov-2 infection and should not be used as the sole basis for treatment or other patient management decisions. A negative result may occur with  improper specimen collection/handling, submission of specimen other than nasopharyngeal swab, presence of viral mutation(s) within the areas targeted by this assay, and inadequate number of viral copies(<138 copies/mL). A negative result must be combined  with clinical observations, patient history, and epidemiological information. The expected result is Negative.  Fact Sheet for Patients:  BloggerCourse.com  Fact Sheet for Healthcare Providers:  SeriousBroker.it  This test is no t yet approved or cleared by the [place] FDA and  has been authorized for detection and/or diagnosis of SARS-CoV-2 by FDA under an Emergency Use Authorization (EUA). This EUA will remain  in effect (meaning this test can be used) for the duration of the COVID-19 declaration under Section 564(b)(1) of the Act, 21 U.S.C.section 360bbb-3(b)(1), unless the authorization is terminated  or revoked sooner.       Influenza A by PCR NEGATIVE NEGATIVE Final   Influenza B by PCR NEGATIVE NEGATIVE Final    Comment: (NOTE) The Xpert Xpress SARS-CoV-2/FLU/RSV plus assay is intended  as an aid in the diagnosis of influenza from Nasopharyngeal swab specimens and should not be used as a sole basis for treatment. Nasal washings and aspirates are unacceptable for Xpert Xpress SARS-CoV-2/FLU/RSV testing.  Fact Sheet for Patients: BloggerCourse.com  Fact Sheet for Healthcare Providers: SeriousBroker.it  This test is not yet approved or cleared by the [place] FDA and has been authorized for detection and/or diagnosis of SARS-CoV-2 by FDA under an Emergency Use Authorization (EUA). This EUA will remain in effect (meaning this test can be used) for the duration of the COVID-19 declaration under Section 564(b)(1) of the Act, 21 U.S.C. section 360bbb-3(b)(1), unless the authorization is terminated or revoked.  Performed at [facility], [address], [place], [place] [address]   Surgical pcr screen     Status: None   Collection Time: 03/03/21 12:19 PM   Specimen: Nasal Mucosa; Nasal Swab  Result Value Ref Range Status   MRSA, PCR NEGATIVE NEGATIVE Final    Staphylococcus aureus NEGATIVE NEGATIVE Final    Comment: (NOTE) The Xpert SA Assay (FDA approved for NASAL specimens in patients 9 years of age and older), is one component of a comprehensive surveillance program. It is not intended to diagnose infection nor to guide or monitor treatment. Performed at [facility], [address] [place], [place] [address]           Radiology Studies: No results found.  Scheduled Meds:  sodium chloride   Intravenous Once   albuterol  2.5 mg Nebulization Daily   apixaban  5 mg Per Tube BID   clonazePAM  0.5 mg Per Tube QHS   feeding supplement (PROSource TF)  45 mL Per Tube Daily   free water  100 mL Per Tube Q4H   levETIRAcetam  1,000 mg Per Tube BID   multivitamin with minerals  1 tablet Per Tube Daily   pantoprazole sodium  40 mg Per Tube Daily   QUEtiapine  100 mg Per Tube BID   thiamine  100 mg Per Tube Daily   vitamin B-12  500 mcg Per Tube Daily   Continuous Infusions:  feeding supplement (OSMOLITE 1.5 CAL) 55 mL/hr at 03/05/21 0707     LOS: 4 days   Time spent:  Azucena Fallen, DO [facility]  If 7PM-7AM, please contact night-coverage [email or web address]  03/06/2021, 7:50 AM

## 2021-03-07 DIAGNOSIS — K219 Gastro-esophageal reflux disease without esophagitis: Secondary | ICD-10-CM | POA: Diagnosis not present

## 2021-03-07 DIAGNOSIS — E441 Mild protein-calorie malnutrition: Secondary | ICD-10-CM | POA: Diagnosis not present

## 2021-03-07 DIAGNOSIS — R1312 Dysphagia, oropharyngeal phase: Secondary | ICD-10-CM | POA: Diagnosis not present

## 2021-03-07 DIAGNOSIS — Z930 Tracheostomy status: Secondary | ICD-10-CM | POA: Diagnosis not present

## 2021-03-07 DIAGNOSIS — F32A Depression, unspecified: Secondary | ICD-10-CM | POA: Diagnosis not present

## 2021-03-07 DIAGNOSIS — R627 Adult failure to thrive: Secondary | ICD-10-CM | POA: Diagnosis not present

## 2021-03-07 DIAGNOSIS — I700 Atherosclerosis of aorta: Secondary | ICD-10-CM | POA: Diagnosis not present

## 2021-03-07 DIAGNOSIS — I10 Essential (primary) hypertension: Secondary | ICD-10-CM | POA: Diagnosis not present

## 2021-03-07 DIAGNOSIS — M1990 Unspecified osteoarthritis, unspecified site: Secondary | ICD-10-CM | POA: Diagnosis not present

## 2021-03-07 DIAGNOSIS — D500 Iron deficiency anemia secondary to blood loss (chronic): Secondary | ICD-10-CM | POA: Diagnosis not present

## 2021-03-07 DIAGNOSIS — I6529 Occlusion and stenosis of unspecified carotid artery: Secondary | ICD-10-CM | POA: Diagnosis not present

## 2021-03-07 DIAGNOSIS — N281 Cyst of kidney, acquired: Secondary | ICD-10-CM | POA: Diagnosis not present

## 2021-03-07 DIAGNOSIS — R7401 Elevation of levels of liver transaminase levels: Secondary | ICD-10-CM | POA: Diagnosis not present

## 2021-03-07 DIAGNOSIS — B370 Candidal stomatitis: Secondary | ICD-10-CM | POA: Diagnosis not present

## 2021-03-07 DIAGNOSIS — M25552 Pain in left hip: Secondary | ICD-10-CM | POA: Diagnosis not present

## 2021-03-07 DIAGNOSIS — R569 Unspecified convulsions: Secondary | ICD-10-CM | POA: Diagnosis not present

## 2021-03-07 DIAGNOSIS — S72142A Displaced intertrochanteric fracture of left femur, initial encounter for closed fracture: Secondary | ICD-10-CM | POA: Diagnosis not present

## 2021-03-07 DIAGNOSIS — S72142D Displaced intertrochanteric fracture of left femur, subsequent encounter for closed fracture with routine healing: Secondary | ICD-10-CM | POA: Diagnosis not present

## 2021-03-07 DIAGNOSIS — Z8673 Personal history of transient ischemic attack (TIA), and cerebral infarction without residual deficits: Secondary | ICD-10-CM | POA: Diagnosis not present

## 2021-03-07 DIAGNOSIS — J9611 Chronic respiratory failure with hypoxia: Secondary | ICD-10-CM | POA: Diagnosis not present

## 2021-03-07 DIAGNOSIS — I480 Paroxysmal atrial fibrillation: Secondary | ICD-10-CM | POA: Diagnosis not present

## 2021-03-07 DIAGNOSIS — R911 Solitary pulmonary nodule: Secondary | ICD-10-CM | POA: Diagnosis not present

## 2021-03-07 DIAGNOSIS — Q446 Cystic disease of liver: Secondary | ICD-10-CM | POA: Diagnosis not present

## 2021-03-07 DIAGNOSIS — E785 Hyperlipidemia, unspecified: Secondary | ICD-10-CM | POA: Diagnosis not present

## 2021-03-07 DIAGNOSIS — F419 Anxiety disorder, unspecified: Secondary | ICD-10-CM | POA: Diagnosis not present

## 2021-03-07 DIAGNOSIS — M549 Dorsalgia, unspecified: Secondary | ICD-10-CM | POA: Diagnosis not present

## 2021-03-07 DIAGNOSIS — I739 Peripheral vascular disease, unspecified: Secondary | ICD-10-CM | POA: Diagnosis not present

## 2021-03-07 LAB — GLUCOSE, CAPILLARY
Glucose-Capillary: 100 mg/dL — ABNORMAL HIGH (ref 70–99)
Glucose-Capillary: 104 mg/dL — ABNORMAL HIGH (ref 70–99)
Glucose-Capillary: 130 mg/dL — ABNORMAL HIGH (ref 70–99)
Glucose-Capillary: 131 mg/dL — ABNORMAL HIGH (ref 70–99)
Glucose-Capillary: 137 mg/dL — ABNORMAL HIGH (ref 70–99)
Glucose-Capillary: 138 mg/dL — ABNORMAL HIGH (ref 70–99)

## 2021-03-07 LAB — RESP PANEL BY RT-PCR (FLU A&B, COVID) ARPGX2
Influenza A by PCR: NEGATIVE
Influenza B by PCR: NEGATIVE
SARS Coronavirus 2 by RT PCR: NEGATIVE

## 2021-03-07 MED ORDER — CLONAZEPAM 0.5 MG PO TABS: 0.5000 mg | ORAL_TABLET | Freq: Every day | ORAL | 0 refills | Status: AC

## 2021-03-07 MED ORDER — OSMOLITE 1.5 CAL PO LIQD: 1000.0000 mL | ORAL | 0 refills | Status: DC

## 2021-03-07 MED ORDER — ONDANSETRON HCL 4 MG PO TABS: 4.0000 mg | ORAL_TABLET | Freq: Four times a day (QID) | ORAL | Status: DC | PRN

## 2021-03-07 MED ORDER — FERROUS SULFATE 300 (60 FE) MG/5ML PO SYRP: 300.0000 mg | ORAL_SOLUTION | Freq: Every day | ORAL | Status: AC

## 2021-03-07 MED ORDER — FREE WATER: 100.0000 mL | Status: AC

## 2021-03-07 MED ORDER — PROSOURCE TF PO LIQD: 45.0000 mL | Freq: Every day | ORAL | Status: DC

## 2021-03-07 MED ORDER — FERROUS SULFATE 300 (60 FE) MG/5ML PO SYRP
60.0000 mg | ORAL_SOLUTION | Freq: Every day | ORAL | Status: DC
  Filled 2021-03-07: qty 5

## 2021-03-07 NOTE — Care Management Important Message (Signed)
Important Message  Patient Details  Name: Adam Mcclain MRN: [id number] Date of Birth: 30-Nov-1950   Medicare Important Message Given:  Yes     Dorena Bodo 03/07/2021, 3:46 PM

## 2021-03-07 NOTE — TOC Transition Note (Signed)
Transition of Care [facility]) - CM/SW Discharge Note   Patient Details  Name: Adam Mcclain MRN: [id number] Date of Birth: September 18, 1950  [facility] CM/SW Contact:  Ralene Bathe, LCSWA Phone Number: 03/07/2021, 11:38 AM   Clinical Narrative:     Patient will DC to: [facility]  Anticipated DC date: 03/07/2021 Family notified: YES Transport by: Sharin Mons   Per MD patient ready for DC to SNF. RN to call report prior to discharge [phone]. RN, patient, patient's family, and facility notified of DC. Discharge Summary and FL2 sent to facility. DC packet on chart. Ambulance transport requested for patient.   CSW spoke with Adria Devon with admissions at [facility] who verified that the patient could return to the facility today.   CSW will sign off for now as social work intervention is no longer needed. Please consult [place] again if new needs arise.    Final next level of care: Skilled Nursing Facility Barriers to Discharge: Barriers Resolved   Patient Goals and CMS Choice        Discharge Placement              Patient chooses bed at: [facility] Patient to be transferred to facility by: [facility] Name of family member notified: Tobiah, Celestine [phone]   [phone] Chesky, Heyer Brother [phone]   [phone]) Patient and family notified of of transfer: 03/07/21  Discharge Plan and Services                                     Social Determinants of Health (SDOH) Interventions     Readmission Risk Interventions No flowsheet data found.

## 2021-03-07 NOTE — Discharge Instructions (Signed)
Adam Mcclain,  You had a hip fracture. This was repaired successfully by orthopedic surgery. Please follow-up with your PCP for a hospital follow-up in addition to your orthopedic surgeon.

## 2021-03-07 NOTE — Discharge Summary (Signed)
Physician Discharge Summary  Adam Mcclain Mcclain:[id number] DOB: 1951/02/14 DOA: 03/02/2021  PCP: Oneita Hurt, No  Admit date: 03/02/2021 Discharge date: 03/07/2021  Admitted From: SNF Disposition: SNF  Recommendations for Outpatient Follow-up:  Follow up with PCP in 1 week Follow up with Orthopedic surgery in 2 weeks Please obtain CBC in 3-5 days to ensure stability of hemoglobin Please follow up on the following pending results: None   Discharge Condition: Stable CODE STATUS: DNR Diet recommendation: Tube feeds   Brief/Interim Summary:  Admission HPI written by Azucena Fallen, MD   HPI: Adam Mcclain is a 70 y.o. male with medical history significant of paroxysmal atrial fibrillation, chronic respiratory failure with hypoxia, anxiety, osteoarthritis, chronic back pain, COPD, GERD, elderly testis, hypertension, peripheral arterial disease, seizure disorder, status post gastrostomy and tracheostomy who had an unwitnessed fall landing on his left side.  He had an x-ray done at the facility which demonstrated a fracture.  EMS was called and they administered 4 mg of morphine IV.  His blood pressure decreased to 93/54 mmHg after he was given morphine.  He denies LOC, neck pain, lightheadedness, blurred vision, decreased sensation Adam focal weakness.  No fever, chills, rhinorrhea, sore throat, dyspnea, wheezing Adam hemoptysis.  Denies chest pain, palpitations, diaphoresis, PND, orthopnea Adam pitting edema of the lower extremities.  No abdominal pain, nausea, emesis, diarrhea, melena Adam hematochezia.  No dysuria, frequency Adam hematuria.  No polyuria, polydipsia, polyphagia Adam blurred vision.   Hospital course:  Left proximal femur fracture Secondary to unwitnessed fall without concern for syncope. Orthopedic surgery consulted and performed ORIF with left IM nail placement on 03/03/2021. PT/OT recommending SNF.  Acute blood loss anemia Baseline hemoglobin of 10. Hemoglobin dropped down to 6.7  post-op requiring transfusion of 2 units of PRBC with rebound hemoglobin of 10.2. Iron panel obtained and was significant for an iron level of 16 with a ferritin of 602 and TIBC of 162 consistent with anemia of chronic disease. IV ferric gluconate given on 6/20 and patient discharged on ferrous sulfate. Recommend repeat CBC in 3-5 days to ensure stability.  Paroxysmal atrial fibrillation Currently in sinus rhythm. Patient is on Xarelto as an outpatient. Continue Xarelto.  COPD Chronic respiratory failure with hypoxia Stable.  Failure to thrive Dysphagia Continue tube feeds. Rate increased to 55 ml/hr for continuous tube feeds.  Seizure disorder Continue Keppra.  Primary hypertension Continue outpatient amlodipine.  Hyperlipidemia -Continue Lipitor  Carotid artery stenosis Noted. On Eliquis. Continue on discharge.  Depression Anxiety Continue Seroquel and Klonopin  Discharge Diagnoses:  Principal Problem:   Closed intertrochanteric fracture of hip, left, initial encounter [facility]) Active Problems:   Bilateral carotid artery disease (HCC)   Seizure (HCC)   Seizure disorder (HCC)   Paroxysmal atrial fibrillation (HCC)   Tracheostomy status (HCC)   Normocytic anemia   Mild protein-calorie malnutrition (HCC)   Transaminitis   Hypertension    Discharge Instructions   Allergies as of 03/07/2021   No Known Allergies      Medication List     STOP taking these medications    acetaminophen 325 MG tablet Commonly known as: TYLENOL   aspirin 81 MG chewable tablet   ciprofloxacin 500 MG tablet Commonly known as: CIPRO   clopidogrel 75 MG tablet Commonly known as: Plavix       TAKE these medications    albuterol (2.5 MG/3ML) 0.083% nebulizer solution Commonly known as: PROVENTIL Take 3 mLs by nebulization every morning.   AMINO ACID  PO 30 mLs by PEG Tube route daily. Amino acids liquid   amLODipine 10 MG tablet Commonly known as: NORVASC Place 1 tablet  (10 mg total) into feeding tube daily.   apixaban 5 MG Tabs tablet Commonly known as: ELIQUIS Place 1 tablet (5 mg total) into feeding tube 2 (two) times daily.   atorvastatin 40 MG tablet Commonly known as: LIPITOR Place 1 tablet (40 mg total) into feeding tube daily.   clonazePAM 0.5 MG tablet Commonly known as: KLONOPIN Place 1 tablet (0.5 mg total) into feeding tube at bedtime. What changed: when to take this   eucerin cream Apply 1 application topically as needed for dry skin.   feeding supplement (OSMOLITE 1.5 CAL) Liqd Place 1,000 mLs into feeding tube continuous. 55 ml/hr. What changed: additional instructions   feeding supplement (PROSource TF) liquid Place 45 mLs into feeding tube daily. Start taking on: March 08, 2021 What changed: when to take this   ferrous sulfate 300 (60 Fe) MG/5ML syrup Place 5 mLs (300 mg total) into feeding tube daily with breakfast.   folic acid 1 MG tablet Commonly known as: FOLVITE Place 1 tablet (1 mg total) into feeding tube daily.   free water Soln Place 100 mLs into feeding tube every 4 (four) hours.   HYDROcodone-acetaminophen 5-325 MG tablet Commonly known as: Norco Take 1-2 tablets by mouth every 6 (six) hours as needed for moderate pain Adam severe pain. MAXIMUM TOTAL ACETAMINOPHEN DOSE IS 4000 MG PER DAY   ipratropium-albuterol 0.5-2.5 (3) MG/3ML Soln Commonly known as: DUONEB Take 3 mLs by nebulization every 6 (six) hours as needed.   levETIRAcetam 100 MG/ML solution Commonly known as: KEPPRA Place 10 mLs (1,000 mg total) into feeding tube 2 (two) times daily.   multivitamin with minerals Tabs tablet Place 1 tablet into feeding tube daily.   ondansetron 4 MG tablet Commonly known as: ZOFRAN Place 1 tablet (4 mg total) into feeding tube every 6 (six) hours as needed for nausea.   pantoprazole sodium 40 mg/20 mL Pack Commonly known as: PROTONIX Place 20 mLs (40 mg total) into feeding tube daily.   QUEtiapine 100 MG  tablet Commonly known as: SEROQUEL Place 1 tablet (100 mg total) into feeding tube 2 (two) times daily.   thiamine 100 MG tablet Place 100 mg into feeding tube daily. What changed: Another medication with the same name was removed. Continue taking this medication, and follow the directions you see here.   vitamin B-12 500 MCG tablet Commonly known as: CYANOCOBALAMIN Place 500 mcg into feeding tube daily. What changed: Another medication with the same name was removed. Continue taking this medication, and follow the directions you see here.        Follow-up Information     Teryl Lucy, MD. Schedule an appointment as soon as possible for a visit in 2 week(s).   Specialty: Orthopedic Surgery Contact information: [address] [place] [address] [phone]                No Known Allergies  Consultations: Orthopedic surgery   Procedures/Studies: CT ABDOMEN WO CONTRAST  Result Date: 02/14/2021 CLINICAL DATA:  Evaluate anatomy for percutaneous gastrostomy tube placement. EXAM: CT ABDOMEN WITHOUT CONTRAST TECHNIQUE: Multidetector CT imaging of the abdomen was performed following the standard protocol without IV contrast. COMPARISON:  None. FINDINGS: Lower chest: Dependent densities at both lung bases that could represent atelectasis. No large pleural effusions. Possible 6 mm nodule in the posterior left lower  lobe on sequence 5, image 15. Coronary artery calcifications. Hepatobiliary: Numerous hypodensities throughout the liver. These hypodensities are water attenuation based on the Hounsfield units and most compatible with numerous hepatic cysts. Index cyst at the left hepatic dome measures up to 3.7 cm. Cholecystectomy clips. Pancreas: Unremarkable. No pancreatic ductal dilatation Adam surrounding inflammatory changes. Spleen: Normal in size without focal abnormality. Adrenals/Urinary Tract: Normal appearance of the adrenal glands. Exophytic low-density  structure in left kidney lower pole measures up to 3.1 cm and suggestive for a cyst based on the Hounsfield units. No hydronephrosis. Stomach/Bowel: There is a nasogastric feeding tube with the tip near the pylorus. Small amount of high-density contrast in the stomach. Focal collection of oral contrast in the precaval region and this is likely associated with a duodenal diverticulum. Additional contrast in small bowel loops. No significant bowel dilatation. The stomach is cephalad to the transverse colon and no bowel structures positioned between the stomach and the anterior abdominal wall. Small hiatal hernia. Vascular/Lymphatic: Aorta is heavily calcified and measures up to 2.8 cm. There is short stent within the right common iliac artery. No significant lymph node enlargement in the abdomen. Other: Negative for ascites.  Negative for free air. Musculoskeletal: No acute bone abnormality. IMPRESSION: 1. The anatomy should be amendable for a percutaneous gastrostomy tube. There is no bowel located between the stomach and anterior abdominal wall. Please note there is evidence for a small hiatal hernia. 2. Focal collection of oral contrast in the precaval region. Suspect this is associated with a duodenal diverticulum. 3. Indeterminate 6 mm nodule at the left lung base. Non-contrast chest CT at 6-12 months is recommended. If the nodule is stable at time of repeat CT, then future CT at 18-24 months (from today's scan) is considered optional for low-risk patients, but is recommended for high-risk patients. This recommendation follows the consensus statement: Guidelines for Management of Incidental Pulmonary Nodules Detected on CT Images: From the Fleischner Society 2017; Radiology 2017; 284:228-243. 4. Hepatic cysts.  Left renal cyst. 5.  Aortic Atherosclerosis (ICD10-I70.0). Electronically Signed   By: Richarda Overlie M.D.   On: 02/14/2021 16:45   DG Chest 1 View  Result Date: 03/02/2021 CLINICAL DATA:  Cough and known  left hip fracture EXAM: CHEST  1 VIEW COMPARISON:  02/23/2021 FINDINGS: Cardiac shadow is enlarged but stable. Tracheostomy tube is noted in satisfactory position. The lungs are clear bilaterally. No acute bony abnormality is seen. Gastrostomy catheter is noted within the stomach. IMPRESSION: No acute abnormality noted. Electronically Signed   By: Alcide Clever M.D.   On: 03/02/2021 19:20   DG Abd 1 View  Result Date: 02/10/2021 CLINICAL DATA:  Possible constipation EXAM: ABDOMEN - 1 VIEW COMPARISON:  02/03/2021 FINDINGS: Feeding catheter is again noted in the distal stomach. Scattered large and small bowel gas is noted. Mild retained fecal material is noted in the right colon although no constipation is noted. IMPRESSION: Feeding catheter in the distal stomach. No other focal abnormality is noted. Electronically Signed   By: Alcide Clever M.D.   On: 02/10/2021 18:48   CT Head Wo Contrast  Result Date: 03/02/2021 CLINICAL DATA:  Recent fall EXAM: CT HEAD WITHOUT CONTRAST TECHNIQUE: Contiguous axial images were obtained from the base of the skull through the vertex without intravenous contrast. COMPARISON:  01/28/2021 FINDINGS: Brain: Mild atrophic changes and chronic white matter ischemic changes are seen. 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 No acute hemorrhage Adam  acute infarct is seen. Vascular: No hyperdense vessel Adam unexpected calcification. Skull: Normal. Negative for fracture Adam focal lesion. Sinuses/Orbits: Opacification of the right maxillary antrum is noted increased in the interval from the prior exam. Additionally fluid is noted within the right ethmoid and frontal sinuses chronic in nature. Other: None. IMPRESSION: Chronic atrophic and ischemic changes intracranially. Progressive sinus disease particularly in the right maxillary antrum. Electronically Signed   By: Alcide CleverMark  Lukens M.D.   On: 03/02/2021 18:56   IR GASTROSTOMY TUBE MOD  SED  Result Date: 02/21/2021 CLINICAL DATA:  Chronic respiratory failure with tracheostomy, needs enteral feeding support EXAM: PERC PLACEMENT GASTROSTOMY FLUOROSCOPY TIME:  90 seconds; 4 mGy TECHNIQUE: The procedure, risks, benefits, and alternatives were explained to the patient and Adam Mcclain. Questions regarding the procedure were encouraged and answered. The Adam Mcclain understands and consents to the procedure. As antibiotic prophylaxis, cefazolin 2 g was ordered pre-procedure and administered intravenously within one hour of incision.Progression of previously administered oral barium into the colon was confirmed fluoroscopically. A 5 French angiographic catheter was placed as orogastric tube. The upper abdomen was prepped with Betadine, draped in usual sterile fashion, and infiltrated locally with 1% lidocaine. Intravenous Fentanyl 50mcg and Versed 1mg  were administered as conscious sedation during continuous monitoring of the patient's level of consciousness and physiological / cardiorespiratory status by the radiology RN, with a total moderate sedation time of 11 minutes. Stomach was insufflated using air through the orogastric tube. An 6318 French sheath needle was advanced percutaneously into the gastric lumen under fluoroscopy. Gas could be aspirated and a small contrast injection confirmed intraluminal spread. The sheath was exchanged over a guidewire for a 9 [place]French vascular sheath, through which the snare device was advanced and used to snare a guidewire passed through the orogastric tube. This was withdrawn, and the snare attached to the 20 French pull-through gastrostomy tube, which was advanced antegrade, positioned with the internal bumper securing the anterior gastric wall to the anterior abdominal wall. Small contrast injection confirms appropriate positioning. The external bumper was applied and the catheter was flushed. COMPLICATIONS: COMPLICATIONS none IMPRESSION: 1. Technically successful 20 French  pull-through gastrostomy placement under fluoroscopy. Electronically Signed   By: Corlis Leak  Hassell M.D.   On: 02/21/2021 07:59   DG CHEST PORT 1 VIEW  Result Date: 02/23/2021 CLINICAL DATA:  Abnormal white blood cell count EXAM: PORTABLE CHEST 1 VIEW COMPARISON:  02/03/2021 FINDINGS: Tracheostomy tube remains in place. Feeding tube has been removed in the interval with partially visualized gastrostomy tube at the left upper quadrant. Stable cardiomediastinal contours. No focal airspace consolidation, pleural effusion, Adam pneumothorax. IMPRESSION: No active disease. Electronically Signed   By: Duanne GuessNicholas  Plundo D.O.   On: 02/23/2021 12:01   DG C-Arm 1-60 Min  Result Date: 03/03/2021 CLINICAL DATA:  Left femur IM nail. EXAM: LEFT FEMUR 2 VIEWS; DG C-ARM 1-60 MIN COMPARISON:  Hip radiograph yesterday. FINDINGS: Four fluoroscopic spot views of the left proximal femur obtained in the operating room in frontal and lateral projection. Intramedullary nail with distal locking and trans trochanteric screw traverses intertrochanteric femur fracture. Fluoroscopy time 1 minutes 37 seconds. IMPRESSION: Fluoroscopic spot views during left femur fracture ORIF. Electronically Signed   By: Narda RutherfordMelanie  Sanford M.D.   On: 03/03/2021 16:47   DG HIP UNILAT WITH PELVIS 2-3 VIEWS LEFT  Result Date: 03/03/2021 CLINICAL DATA:  ORIF left hip fracture. EXAM: DG HIP (WITH Adam WITHOUT PELVIS) 2-3V LEFT COMPARISON:  Radiograph yesterday. FINDINGS: Intramedullary nail with trans trochanteric and distal  locking screws traverse intertrochanteric femur fracture. The fracture is in improved alignment compared to preoperative imaging. Persistent displacement of the lesser trochanter. Recent postsurgical change includes air and edema in the soft tissues. Prior intramedullary nail fixation of the right femur is partially included with intact hardware where visualized. IMPRESSION: ORIF intertrochanteric femur fracture, in improved alignment compared to  preoperative imaging. Electronically Signed   By: Narda Rutherford M.D.   On: 03/03/2021 17:54   DG Hip Unilat With Pelvis 2-3 Views Left  Result Date: 03/02/2021 CLINICAL DATA:  Recent fall with left hip pain, initial encounter EXAM: DG HIP (WITH Adam WITHOUT PELVIS) 2-3V LEFT COMPARISON:  None. FINDINGS: Pelvic ring is intact. Postsurgical changes are noted in the proximal right femur. Comminuted intratrochanteric fracture of the left femur is seen. Impaction is noted at the fracture site. No soft tissue abnormality is noted. IMPRESSION: Comminuted intratrochanteric fracture of the proximal left femur. Electronically Signed   By: Alcide Clever M.D.   On: 03/02/2021 19:17   DG Hip Unilat With Pelvis 2-3 Views Right  Result Date: 03/02/2021 CLINICAL DATA:  Recent fall with right hip pain, initial encounter EXAM: DG HIP (WITH Adam WITHOUT PELVIS) 2-3V RIGHT COMPARISON:  None. FINDINGS: Postsurgical changes are noted in the proximal right femur with medullary rod and fixation screw traversing the femoral neck. Healed proximal right femoral fracture is noted. Visualized pelvic ring is unremarkable. No soft tissue abnormality is noted. IMPRESSION: Postsurgical change in the right hip without acute abnormality. Electronically Signed   By: Alcide Clever M.D.   On: 03/02/2021 19:19   DG FEMUR MIN 2 VIEWS LEFT  Result Date: 03/03/2021 CLINICAL DATA:  Left femur IM nail. EXAM: LEFT FEMUR 2 VIEWS; DG C-ARM 1-60 MIN COMPARISON:  Hip radiograph yesterday. FINDINGS: Four fluoroscopic spot views of the left proximal femur obtained in the operating room in frontal and lateral projection. Intramedullary nail with distal locking and trans trochanteric screw traverses intertrochanteric femur fracture. Fluoroscopy time 1 minutes 37 seconds. IMPRESSION: Fluoroscopic spot views during left femur fracture ORIF. Electronically Signed   By: Narda Rutherford M.D.   On: 03/03/2021 16:47      Subjective: No issues overnight.    Discharge Exam: Vitals:   03/07/21 0734 03/07/21 0830  BP: 129/71   Pulse: 79 76  Resp: 20 18  Temp: 99.3 F (37.4 C)   SpO2: 98% 99%   Vitals:   03/06/21 2112 03/07/21 0101 03/07/21 0734 03/07/21 0830  BP:   129/71   Pulse: 87 81 79 76  Resp: 18 18 20 18   Temp:   99.3 F (37.4 C)   TempSrc:   Oral   SpO2: 94% 95% 98% 99%  Weight:      Height:        General: Pt is alert, awake, not in acute distress Cardiovascular: RRR, S1/S2 +, no rubs, no gallops Respiratory: CTA bilaterally, no wheezing, mild rhonchi. Trach in place with some clear mucoid discharge Abdominal: Soft, NT, ND, bowel sounds + Extremities: no edema, no cyanosis. Left thigh with intact bandages and no surrounding ecchymosis.    The results of significant diagnostics from this hospitalization (including imaging, microbiology, ancillary and laboratory) are listed below for reference.     Microbiology: Recent Results (from the past 240 hour(s))  SARS CORONAVIRUS 2 (TAT 6-24 HRS) Nasopharyngeal Nasopharyngeal Swab     Status: None   Collection Time: 02/28/21 11:25 AM   Specimen: Nasopharyngeal Swab  Result Value Ref Range Status  SARS Coronavirus 2 NEGATIVE NEGATIVE Final    Comment: (NOTE) SARS-CoV-2 target nucleic acids are NOT DETECTED.  The SARS-CoV-2 RNA is generally detectable in upper and lower respiratory specimens during the acute phase of infection. Negative results do not preclude SARS-CoV-2 infection, do not rule out co-infections with other pathogens, and should not be used as the sole basis for treatment Adam other patient management decisions. Negative results must be combined with clinical observations, patient history, and epidemiological information. The expected result is Negative.  Fact Sheet for Patients: HairSlick.no  Fact Sheet for Healthcare Providers: quierodirigir.com  This test is not yet approved Adam cleared by the  [place] FDA and  has been authorized for detection and/Adam diagnosis of SARS-CoV-2 by FDA under an Emergency Use Authorization (EUA). This EUA will remain  in effect (meaning this test can be used) for the duration of the COVID-19 declaration under Se ction 564(b)(1) of the Act, 21 U.S.C. section 360bbb-3(b)(1), unless the authorization is terminated Adam revoked sooner.  Performed at [facility], [address], [place], [place] [address]   Resp Panel by RT-PCR (Flu A&B, Covid) Nasopharyngeal Swab     Status: None   Collection Time: 03/02/21  9:00 PM   Specimen: Nasopharyngeal Swab; Nasopharyngeal(NP) swabs in vial transport medium  Result Value Ref Range Status   SARS Coronavirus 2 by RT PCR NEGATIVE NEGATIVE Final    Comment: (NOTE) SARS-CoV-2 target nucleic acids are NOT DETECTED.  The SARS-CoV-2 RNA is generally detectable in upper respiratory specimens during the acute phase of infection. The lowest concentration of SARS-CoV-2 viral copies this assay can detect is 138 copies/mL. A negative result does not preclude SARS-Cov-2 infection and should not be used as the sole basis for treatment Adam other patient management decisions. A negative result may occur with  improper specimen collection/handling, submission of specimen other than nasopharyngeal swab, presence of viral mutation(s) within the areas targeted by this assay, and inadequate number of viral copies(<138 copies/mL). A negative result must be combined with clinical observations, patient history, and epidemiological information. The expected result is Negative.  Fact Sheet for Patients:  BloggerCourse.com  Fact Sheet for Healthcare Providers:  SeriousBroker.it  This test is no t yet approved Adam cleared by the [place] FDA and  has been authorized for detection and/Adam diagnosis of SARS-CoV-2 by FDA under an Emergency Use Authorization (EUA). This EUA  will remain  in effect (meaning this test can be used) for the duration of the COVID-19 declaration under Section 564(b)(1) of the Act, 21 U.S.C.section 360bbb-3(b)(1), unless the authorization is terminated  Adam revoked sooner.       Influenza A by PCR NEGATIVE NEGATIVE Final   Influenza B by PCR NEGATIVE NEGATIVE Final    Comment: (NOTE) The Xpert Xpress SARS-CoV-2/FLU/RSV plus assay is intended as an aid in the diagnosis of influenza from Nasopharyngeal swab specimens and should not be used as a sole basis for treatment. Nasal washings and aspirates are unacceptable for Xpert Xpress SARS-CoV-2/FLU/RSV testing.  Fact Sheet for Patients: BloggerCourse.com  Fact Sheet for Healthcare Providers: SeriousBroker.it  This test is not yet approved Adam cleared by the [place] FDA and has been authorized for detection and/Adam diagnosis of SARS-CoV-2 by FDA under an Emergency Use Authorization (EUA). This EUA will remain in effect (meaning this test can be used) for the duration of the COVID-19 declaration under Section 564(b)(1) of the Act, 21 U.S.C. section 360bbb-3(b)(1), unless the authorization is terminated Adam revoked.  Performed  at [facility], [address] [place], [place] [address]   Surgical pcr screen     Status: None   Collection Time: 03/03/21 12:19 PM   Specimen: Nasal Mucosa; Nasal Swab  Result Value Ref Range Status   MRSA, PCR NEGATIVE NEGATIVE Final   Staphylococcus aureus NEGATIVE NEGATIVE Final    Comment: (NOTE) The Xpert SA Assay (FDA approved for NASAL specimens in patients 56 years of age and older), is one component of a comprehensive surveillance program. It is not intended to diagnose infection nor to guide Adam monitor treatment. Performed at [facility], [address] [place], [place] [address]      Labs: BNP (last 3 results) No results for input(s): BNP in the last 8760 hours. Basic  Metabolic Panel: Recent Labs  Lab 03/02/21 1743 03/03/21 0502 03/04/21 0123 03/05/21 0250 03/06/21 0349  NA 136 136 135 134* 137  K 4.2 4.1 3.8 3.2* 3.6  CL 99 102 101 99 98  CO2 28 29 26 27 27   GLUCOSE 114* 113* 125* 146* 144*  BUN 24* 20 15 14 15   CREATININE 1.14 1.09 1.15 1.19 0.97  CALCIUM 8.5* 8.5* 8.2* 8.0* 7.9*   Liver Function Tests: Recent Labs  Lab 03/02/21 1743 03/03/21 0502  AST 45* 36  ALT 69* 60*  ALKPHOS 110 108  BILITOT 0.2* 0.5  PROT 7.0 6.1*  ALBUMIN 3.0* 2.8*   No results for input(s): LIPASE, AMYLASE in the last 168 hours. No results for input(s): AMMONIA in the last 168 hours. CBC: Recent Labs  Lab 03/02/21 1743 03/03/21 0502 03/04/21 0123 03/05/21 0250 03/06/21 0349  WBC 11.9* 11.0* 12.2* 7.3 7.3  NEUTROABS 10.4*  --   --   --   --   HGB 10.9* 9.7* 8.9* 6.7* 10.2*  HCT 34.2* 30.1* 26.8* 20.5* 30.6*  MCV 98.6 97.1 94.0 95.3 91.9  PLT 252 231 212 184 181   Cardiac Enzymes: No results for input(s): CKTOTAL, CKMB, CKMBINDEX, TROPONINI in the last 168 hours. BNP: Invalid input(s): POCBNP CBG: Recent Labs  Lab 03/06/21 1608 03/06/21 2009 03/07/21 0006 03/07/21 0414 03/07/21 0734  GLUCAP 121* 119* 131* 138* 137*   D-Dimer No results for input(s): DDIMER in the last 72 hours. Hgb A1c No results for input(s): HGBA1C in the last 72 hours. Lipid Profile No results for input(s): CHOL, HDL, LDLCALC, TRIG, CHOLHDL, LDLDIRECT in the last 72 hours. Thyroid function studies No results for input(s): TSH, T4TOTAL, T3FREE, THYROIDAB in the last 72 hours.  Invalid input(s): FREET3 Anemia work up Recent Labs    03/05/21 0940  VITAMINB12 223  FOLATE 22.4  FERRITIN 602*  TIBC 162*  IRON 16*  RETICCTPCT 3.1   Urinalysis    Component Value Date/Time   COLORURINE STRAW (A) 01/19/2021 1210   APPEARANCEUR CLEAR 01/19/2021 1210   LABSPEC 1.008 01/19/2021 1210   PHURINE 5.0 01/19/2021 1210   GLUCOSEU NEGATIVE 01/19/2021 1210   HGBUR  MODERATE (A) 01/19/2021 1210   BILIRUBINUR NEGATIVE 01/19/2021 1210   KETONESUR NEGATIVE 01/19/2021 1210   PROTEINUR 100 (A) 01/19/2021 1210   NITRITE NEGATIVE 01/19/2021 1210   LEUKOCYTESUR NEGATIVE 01/19/2021 1210   Sepsis Labs Invalid input(s): PROCALCITONIN,  WBC,  LACTICIDVEN Microbiology Recent Results (from the past 240 hour(s))  SARS CORONAVIRUS 2 (TAT 6-24 HRS) Nasopharyngeal Nasopharyngeal Swab     Status: None   Collection Time: 02/28/21 11:25 AM   Specimen: Nasopharyngeal Swab  Result Value Ref Range Status   SARS Coronavirus 2  NEGATIVE NEGATIVE Final    Comment: (NOTE) SARS-CoV-2 target nucleic acids are NOT DETECTED.  The SARS-CoV-2 RNA is generally detectable in upper and lower respiratory specimens during the acute phase of infection. Negative results do not preclude SARS-CoV-2 infection, do not rule out co-infections with other pathogens, and should not be used as the sole basis for treatment Adam other patient management decisions. Negative results must be combined with clinical observations, patient history, and epidemiological information. The expected result is Negative.  Fact Sheet for Patients: HairSlick.no  Fact Sheet for Healthcare Providers: quierodirigir.com  This test is not yet approved Adam cleared by the [place] FDA and  has been authorized for detection and/Adam diagnosis of SARS-CoV-2 by FDA under an Emergency Use Authorization (EUA). This EUA will remain  in effect (meaning this test can be used) for the duration of the COVID-19 declaration under Se ction 564(b)(1) of the Act, 21 U.S.C. section 360bbb-3(b)(1), unless the authorization is terminated Adam revoked sooner.  Performed at [facility], [address] [place], [place] [address]   Resp Panel by RT-PCR (Flu A&B, Covid) Nasopharyngeal Swab     Status: None   Collection Time: 03/02/21  9:00 PM   Specimen: Nasopharyngeal  Swab; Nasopharyngeal(NP) swabs in vial transport medium  Result Value Ref Range Status   SARS Coronavirus 2 by RT PCR NEGATIVE NEGATIVE Final    Comment: (NOTE) SARS-CoV-2 target nucleic acids are NOT DETECTED.  The SARS-CoV-2 RNA is generally detectable in upper respiratory specimens during the acute phase of infection. The lowest concentration of SARS-CoV-2 viral copies this assay can detect is 138 copies/mL. A negative result does not preclude SARS-Cov-2 infection and should not be used as the sole basis for treatment Adam other patient management decisions. A negative result may occur with  improper specimen collection/handling, submission of specimen other than nasopharyngeal swab, presence of viral mutation(s) within the areas targeted by this assay, and inadequate number of viral copies(<138 copies/mL). A negative result must be combined with clinical observations, patient history, and epidemiological information. The expected result is Negative.  Fact Sheet for Patients:  BloggerCourse.com  Fact Sheet for Healthcare Providers:  SeriousBroker.it  This test is no t yet approved Adam cleared by the [place] FDA and  has been authorized for detection and/Adam diagnosis of SARS-CoV-2 by FDA under an Emergency Use Authorization (EUA). This EUA will remain  in effect (meaning this test can be used) for the duration of the COVID-19 declaration under Section 564(b)(1) of the Act, 21 U.S.C.section 360bbb-3(b)(1), unless the authorization is terminated  Adam revoked sooner.       Influenza A by PCR NEGATIVE NEGATIVE Final   Influenza B by PCR NEGATIVE NEGATIVE Final    Comment: (NOTE) The Xpert Xpress SARS-CoV-2/FLU/RSV plus assay is intended as an aid in the diagnosis of influenza from Nasopharyngeal swab specimens and should not be used as a sole basis for treatment. Nasal washings and aspirates are unacceptable for Xpert Xpress  SARS-CoV-2/FLU/RSV testing.  Fact Sheet for Patients: BloggerCourse.com  Fact Sheet for Healthcare Providers: SeriousBroker.it  This test is not yet approved Adam cleared by the [place] FDA and has been authorized for detection and/Adam diagnosis of SARS-CoV-2 by FDA under an Emergency Use Authorization (EUA). This EUA will remain in effect (meaning this test can be used) for the duration of the COVID-19 declaration under Section 564(b)(1) of the Act, 21 U.S.C. section 360bbb-3(b)(1), unless the authorization is terminated Adam revoked.  Performed at [facility], [address] [place], [place] [address]   Surgical pcr screen     Status: None   Collection Time: 03/03/21 12:19 PM   Specimen: Nasal Mucosa; Nasal Swab  Result Value Ref Range Status   MRSA, PCR NEGATIVE NEGATIVE Final   Staphylococcus aureus NEGATIVE NEGATIVE Final    Comment: (NOTE) The Xpert SA Assay (FDA approved for NASAL specimens in patients 67 years of age and older), is one component of a comprehensive surveillance program. It is not intended to diagnose infection nor to guide Adam monitor treatment. Performed at [facility], [address] [place], [place] [address]      Time coordinating discharge: 35 minutes  SIGNED:   Jacquelin Hawking, MD [facility] 03/07/2021, 10:41 AM

## 2021-03-20 DIAGNOSIS — D649 Anemia, unspecified: Secondary | ICD-10-CM | POA: Diagnosis not present

## 2021-04-03 ENCOUNTER — Ambulatory Visit (HOSPITAL_COMMUNITY)
Admission: RE | Admit: 2021-04-03 | Discharge: 2021-04-03 | Disposition: A | Discharge status: Home / Self Care | Payer: Medicare Other | Source: Ambulatory Visit | Attending: Acute Care | Admitting: Acute Care

## 2021-04-03 DIAGNOSIS — Z930 Tracheostomy status: Secondary | ICD-10-CM | POA: Diagnosis not present

## 2021-04-03 DIAGNOSIS — R059 Cough, unspecified: Secondary | ICD-10-CM | POA: Diagnosis not present

## 2021-04-03 DIAGNOSIS — I69391 Dysphagia following cerebral infarction: Secondary | ICD-10-CM | POA: Diagnosis not present

## 2021-04-03 DIAGNOSIS — Z430 Encounter for attention to tracheostomy: Secondary | ICD-10-CM | POA: Insufficient documentation

## 2021-04-03 NOTE — Progress Notes (Addendum)
Tracheostomy Procedure Note  Adam Mcclain [id number] 01/16/1951  Pre Procedure Tracheostomy Information  Trach Brand: Shiley Size: 6.0 Style: Uncuffed Secured by: Velcro   Procedure: Stoma education and Trach Decannulation Covid tet negative  Swabbed at [facility] and results brought in with visit.   Post Procedure Tracheostomy Information   Trach removed at this visit   Post Procedure Evaluation:   Vital signs:blood pressure VSS, pulse  80  respirations 20 and pulse oximetry 98 % on RA Patients current condition: stable Complications:  none Trach site exam: cleaned Wound care done: Trach site bandage  Patient did tolerate procedure well.   Education: Stoma care and how to cough post decannulation  Prescription needs: none    Additional needs: Bandages for trach site/stoma along with written orders and instructions going back to [facility].

## 2021-04-03 NOTE — Progress Notes (Signed)
[facility]   Reason for visit:  Trach care and assessment for decannulation  HPI:  This is a 70 year old male who has been residing at [facility] since  may 2022 s/p stroke. He had a prolonged hospital stay and ultimately required Trach/PEG. Presents to trach clinic today for evaluation of trach and hopes that he could have it removed.  Interval.  Resides at SNF Tolerates PMV well Able to tolerate oral diet.   ROS  History obtained from the patient General ROS: negative ENT ROS: negative for - epistaxis, headaches, nasal congestion, oral lesions, sinus pain, or sore throat Endocrine ROS: negative for - temperature intolerance Respiratory ROS: negative for - cough, pleuritic pain, shortness of breath, sputum changes, or tolerating PMV tolerating diet  Cardiovascular ROS: no chest pain or dyspnea on exertion Gastrointestinal ROS: no abdominal pain, change in bowel habits, or black or bloody stools Musculoskeletal ROS: negative Neurological ROS: no TIA or stroke symptoms Vital signs:  Reviewed: Pulse Ox 90s  Exam:  no distress ENT exam normal, no neck nodes or sinus tenderness, throat normal without erythema or exudate, airway not compromised, and trach site unremarkable he tolerated > 10 minutes of trach capping trials w/ no witnessed desaturation, no decrease in vocal quality and good cough  rhonchi bilaterally regular rate and rhythm Abdomen soft and nontender without distention, masses , no wound infection noted. 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 Cough and voice quality are excellent. Tolerated  capping trial so I removed his trach. I called his brother Brett Canales to update him  Plan  Keep current dressing in place for 48 hrs then change QOD w/ dressings provided; once provided dressings are gone can place band aid daily  Encouraged to splint stoma when coughing  Instructed SNF staff in writing if stoma still open in 2 weeks needs ENT eval for trachecutaneous fistula     Visit time: 32  minutes.   Simonne Martinet ACNP-BC [facility] Pulmonary/Critical Care

## 2021-04-30 ENCOUNTER — Other Ambulatory Visit (HOSPITAL_COMMUNITY): Payer: Self-pay | Admitting: Emergency Medicine

## 2021-04-30 DIAGNOSIS — Z431 Encounter for attention to gastrostomy: Secondary | ICD-10-CM

## 2021-05-11 ENCOUNTER — Other Ambulatory Visit: Payer: Self-pay

## 2021-05-11 ENCOUNTER — Ambulatory Visit (HOSPITAL_COMMUNITY)
Admission: RE | Admit: 2021-05-11 | Discharge: 2021-05-11 | Disposition: A | Discharge status: Home / Self Care | Payer: Medicare Other | Source: Ambulatory Visit | Attending: Emergency Medicine | Admitting: Emergency Medicine

## 2021-05-11 DIAGNOSIS — Z431 Encounter for attention to gastrostomy: Secondary | ICD-10-CM | POA: Diagnosis not present

## 2021-05-11 HISTORY — PX: IR GASTROSTOMY TUBE REMOVAL: IMG5492

## 2021-05-11 MED ORDER — LIDOCAINE VISCOUS HCL 2 % MT SOLN
OROMUCOSAL | Status: AC
  Filled 2021-05-11: qty 15

## 2021-05-11 NOTE — Procedures (Signed)
Gastrostomy tube removed per orders. Please see dictation for full procedure report.  [name], [place] [phone] 05/11/2021, 12:36 PM

## 2021-05-24 ENCOUNTER — Encounter: Payer: Self-pay | Admitting: Internal Medicine

## 2021-06-20 ENCOUNTER — Encounter: Payer: Self-pay | Admitting: Neurology

## 2021-06-20 ENCOUNTER — Ambulatory Visit (INDEPENDENT_AMBULATORY_CARE_PROVIDER_SITE_OTHER): Payer: Medicare Other | Admitting: Neurology

## 2021-06-20 VITALS — BP 137/74 | HR 71

## 2021-06-20 DIAGNOSIS — I63511 Cerebral infarction due to unspecified occlusion or stenosis of right middle cerebral artery: Secondary | ICD-10-CM | POA: Diagnosis not present

## 2021-06-20 DIAGNOSIS — I63231 Cerebral infarction due to unspecified occlusion or stenosis of right carotid arteries: Secondary | ICD-10-CM | POA: Diagnosis not present

## 2021-06-20 DIAGNOSIS — R569 Unspecified convulsions: Secondary | ICD-10-CM | POA: Diagnosis not present

## 2021-06-20 NOTE — Patient Instructions (Signed)
I had a long d/w patient about his recent stroke, symptomatic right carotid restenosis and atrial fibrillation risk for recurrent stroke/TIAs, personally independently reviewed imaging studies and stroke evaluation results and answered questions.Continue Eliquis (apixaban) 5 mg twice daily  for secondary stroke prevention and maintain strict control of hypertension with blood pressure goal below 130/90, diabetes with hemoglobin A1c goal below 6.5% and lipids with LDL cholesterol goal below 70 mg/dL. I also advised the patient to eat a healthy diet with plenty of whole grains, cereals, fruits and vegetables, exercise regularly and maintain ideal body weight .he was advised to use his cane at all times and we discussed fall safety precautions.  Referred to Dr. Chestine Spore vascular surgeon to consider a right carotid revascularization for his restenosis. Continue Keppra at the current dose for seizure prophylaxis Followup in the future with me in 6 months or call earlier if necessary. . Fall Prevention in Hospitals, Adult Being a patient in the hospital puts you at risk for falling. Falls can cause serious injury and harm, but they can be prevented. It is important to understand what puts you at risk for falling and what you and your health care team can do to prevent you from falling. If you or a loved one falls at the hospital, it is important to tell hospital staff about it. What increases the risk for falls? Certain conditions and treatments may increase your risk of falling in the hospital. These include: Being in an unfamiliar environment, especially when using the bathroom at night. Having surgery. Being on bed rest. Taking many medicines or certain types of medicines, such as sleeping pills. Having tubes in place, such as IV lines or catheters. Other risk factors for falls in a hospital include: Having difficulty with hearing or vision. Having a change in thinking or behavior, such as confusion. Having  depression. Having trouble with balance. Being a male. Feeling dizzy. Needing to use the toilet frequently. Having fallen during the past three months. Having low blood pressure. What are some strategies for preventing falls? If you or a loved one has to stay in the hospital: Ask about which fall prevention strategies will be in place. Do not hesitate to speak up if you notice that the fall prevention plan has changed. Ask for help moving around, especially after surgery or when feeling unwell. If you have been asked to call for help when getting up, do not get up by yourself. Asking for help with getting up is for your safety, and the staff is there to help you. Wear nonskid footwear. Get up slowly, and sit at the side of the bed for a few minutes before standing up. Keep items you need, such as the nurse call button or a phone, close to you so that you do not need to reach for them. Wear eyeglasses or hearing aids if you have them. Have someone stay in the hospital with you or your loved one. Ask if sleeping pills or other medicines that can cause confusion are necessary. What does the hospital staff do to help prevent falls? Hospitals have systems in place to prevent falls and accidents, which may involve: Discussing your fall risks and making a personalized fall prevention plan. Checking in regularly to see if you need help. Placing an arm band on your wrist or a sign near your room to alert other staff of your needs. Using an alarm on your hospital bed. This is an alarm that goes off if you get out of  bed and forget to call for help. Keeping the bed in a low and locked position. Keeping the area around the bed and bathroom well-lit and free from clutter. Keeping your room quiet, so that you can sleep and be well-rested. Using safety equipment, such as: A belt around your waist. Walkers, crutches, and other devices for support. Safety beds, such as low beds or cushions on the floor  next to the bed. Having a staff person stay with you (one-on-one observation), even when you are using the bathroom. This is for your safety. Using video monitoring. This allows a staff member to come to help you if you need help. What other actions can I take to lower my risk of falls? Check in regularly with your health care provider or pharmacist to review all of the medicines that you take. Make sure that you have a regular exercise program to stay fit. This will help you maintain your balance. Talk with a physical therapist or trainer if recommended by your health care provider. They can help you to improve your strength, balance, and endurance. If you are over age 51: Ask your health care provider if you need a calcium or vitamin D supplement. Have your eyes and hearing checked every year. Have your feet checked every year. Where to find more information You can find more information about fall prevention from the Centers for Disease Control and Prevention: BoiseTaxis.si Summary Being in an unfamiliar environment, such as the hospital, increases your risk for falling. If you have been asked to call for help when getting up, do not get up by yourself. Asking for help with getting up is for your safety, and the staff is there to help you. Ask about which fall prevention strategies will be in place. Do not hesitate to speak up if you notice that the fall prevention plan has changed. If you or a loved one falls, tell the hospital staff. This is important. This information is not intended to replace advice given to you by your health care provider. Make sure you discuss any questions you have with your health care provider. Document Revised: 08/15/2017 Document Reviewed: 04/16/2017 Elsevier Patient Education  2021 [employer].

## 2021-06-20 NOTE — Progress Notes (Signed)
[facility] [address] [place]. [place] [address] [phone]       OFFICE CONSULT NOTE  [name] Date of Birth:  Mar 02, 1951 Medical Record Number:  [id number]   Referring MD: Marvel Plan  Reason for Referral: Stroke  HPI: [name] is a 70 year old Caucasian male seen today for initial office consult visit for stroke.  History is obtained from the patient.  He is accompanied today by Mirna Mires, CNA from [facility] nursing home.  I personally reviewed electronic medical records as well as pertinent available imaging films in PACS.Adam Mcclain  has PMHx of CVA, HTN, atrial fibrillation on aspirin and Plavix, COPD, and CKD who was brought in by EMS on 01/19/2021 from a parking lot where he was found to be shaking. The patient had been sitting in a car waiting on a family member who was in a doctors office. A bystander pulled up and noticed that the patient was looking to the left while shaking and was unresponsive. EMS was called and patient continued to be unresponsive when they arrived. The patient was administered 10 mg Versed in the field. He was being bagged on arrival to the ED and was emergently intubated. EMS stated that they were told the patient has a history of seizures, but no history of such can be found on review of outpatient clinic note in Epic.  He had several EEGs including long-term monitoring which showed only diffuse encephalopathy with no definite epileptiform activity. CT scan of the head showed no acute abnormality and old left PCA infarct was noted.  MRI scan showed tiny subcentimeter right parietal cortical infarcts along the margin of a chronic parieto-occipital infarct.  There is old chronic large left PCA as well as multiple chronic lacunar infarcts noted.  Carotid ultrasound showed 80 to 99% right common carotid artery and 60 to 79% left internal carotid artery stenosis.  Left subclavian artery showed no resistant posttraumatic  waveforms.  CT angiogram of the head and neck confirmed severe 80% right common carotid artery stenosis but showed 50% left ICA stenosis with calcified and soft plaques.  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  50% right subclavian artery stenosis and bilateral moderate carotid siphon stenosis.  Right M1 segment had a short segment severe stenosis.  2D echo showed ejection fraction of 60 to 65%.  LDL cholesterol 43 mg percent and hemoglobin A1c was 5.3.  Patient had respiratory failure and difficult to extubate and hence underwent tracheostomy and PEG tube.  He was initially transferred for rehab to his skilled nursing facility where he is made gradual improvement.  He is eating well and PEG tube is out.  He is breathing well and tracheostomy has also been out.  He is now able to stand with assistance but feels unsteady.  He is able to walk with a walker but mostly prefers using a cane.  He also had a fall on 03/03/2021 and suffered a right hip intertrochanteric fracture for which he underwent surgery by Dr. Dion Saucier which went well.  He is able to now walk with a cane but still feels unsteady.  He was started on Eliquis which is tolerating well with minor bruising but no bleeding.  He has had no recurrent seizures.  Patient was seen by vascular surgery and the plan was for him to follow-up with Dr. Chestine Spore as an outpatient to consider  right common carotid artery revascularization but this has not yet happened. ROS:   14 system review of systems is positive for weakness, difficulty breathing, difficulty swallowing, imbalance, walking difficulty, bruising all other systems negative  PMH:  Past Medical History:  Diagnosis Date   A-fib (HCC)    Acute on chronic respiratory failure with hypoxia (HCC)    Acute stroke due to ischemia (HCC)    Anxiety    Arthritis    Atrial fibrillation  (HCC)    Chronic back pain    COPD (chronic obstructive pulmonary disease) (HCC)    Dysrhythmia    A-Fib   Gastrostomy status (HCC)    GERD (gastroesophageal reflux disease)    History of kidney stones    HTN (hypertension)    Hypertension    Nephrolithiasis    PAD (peripheral artery disease) (HCC)    Paroxysmal atrial fibrillation (HCC)    PVD (peripheral vascular disease) (HCC)    Seizure disorder (HCC)    Stroke (HCC)    Tracheostomy status (HCC)     Social History:  Social History   Socioeconomic History   Marital status: Widowed    Spouse name: Not on file   Number of children: Not on file   Years of education: Not on file   Highest education level: Not on file  Occupational History   Not on file  Tobacco Use   Smoking status: Every Day    Types: Cigars   Smokeless tobacco: Never   Tobacco comments:    7 cigars a day  Vaping Use   Vaping Use: Never used  Substance and Sexual Activity   Alcohol use: Not Currently   Drug use: No   Sexual activity: Not on file  Other Topics Concern   Not on file  Social History Narrative   ** Merged History Encounter **       Social Determinants of Health   Financial Resource Strain: Not on file  Food Insecurity: Not on file  Transportation Needs: Not on file  Physical Activity: Not on file  Stress: Not on file  Social Connections: Not on file  Intimate Partner Violence: Not on file    Medications:   Current Outpatient Medications on File Prior to Visit  Medication Sig Dispense Refill   albuterol (PROVENTIL) (2.5 MG/3ML) 0.083% nebulizer solution Take 3 mLs by nebulization every morning.     Amino Acids (AMINO ACID PO) 30 mLs by PEG Tube route daily. Amino acids liquid     amLODipine (NORVASC) 10 MG tablet Place 1 tablet (10 mg total) into feeding tube daily. 30 tablet 1   apixaban (ELIQUIS) 5 MG TABS tablet Place 1 tablet (5 mg total) into feeding tube 2 (two) times daily. 1 tablet 0   atorvastatin (LIPITOR) 40 MG  tablet Place 1 tablet (40 mg total) into feeding tube daily. 1 tablet 0   clonazePAM (KLONOPIN) 0.5 MG tablet Place 1 tablet (0.5 mg total) into feeding tube at bedtime. 5 tablet 0   ferrous sulfate 300 (60 Fe) MG/5ML syrup Place 5 mLs (300 mg total) into feeding tube daily with breakfast.     folic acid (FOLVITE) 1 MG tablet Place 1 tablet (1 mg total) into feeding tube daily. 1 tablet 0   HYDROcodone-acetaminophen (NORCO) 5-325 MG tablet Take 1-2 tablets by mouth every 6 (six) hours as needed for moderate pain or severe pain. MAXIMUM TOTAL ACETAMINOPHEN DOSE IS 4000 MG PER DAY 20 tablet 0   ipratropium-albuterol (DUONEB) 0.5-2.5 (3)  MG/3ML SOLN Take 3 mLs by nebulization every 6 (six) hours as needed. 1 mL 0   levETIRAcetam (KEPPRA) 100 MG/ML solution Place 10 mLs (1,000 mg total) into feeding tube 2 (two) times daily. 473 mL 12   Multiple Vitamin (MULTIVITAMIN WITH MINERALS) TABS tablet Place 1 tablet into feeding tube daily. 1 tablet 0   Nutritional Supplements (FEEDING SUPPLEMENT, OSMOLITE 1.5 CAL,) LIQD Place 1,000 mLs into feeding tube continuous. 55 ml/hr.  0   Nutritional Supplements (FEEDING SUPPLEMENT, PROSOURCE TF,) liquid Place 45 mLs into feeding tube daily.     ondansetron (ZOFRAN) 4 MG tablet Place 1 tablet (4 mg total) into feeding tube every 6 (six) hours as needed for nausea.     pantoprazole sodium (PROTONIX) 40 mg/20 mL PACK Place 20 mLs (40 mg total) into feeding tube daily. 1 mL 1   QUEtiapine (SEROQUEL) 100 MG tablet Place 1 tablet (100 mg total) into feeding tube 2 (two) times daily. 1 tablet 0   Skin Protectants, Misc. (EUCERIN) cream Apply 1 application topically as needed for dry skin.     thiamine 100 MG tablet Place 100 mg into feeding tube daily.     vitamin B-12 (CYANOCOBALAMIN) 500 MCG tablet Place 500 mcg into feeding tube daily.     Water For Irrigation, Sterile (FREE WATER) SOLN Place 100 mLs into feeding tube every 4 (four) hours.     No current  facility-administered medications on file prior to visit.    Allergies:  No Known Allergies  Physical Exam General: Unkempt malnourished looking elderly Caucasian male seated, in no evident distress Head: head normocephalic and atraumatic.   Neck: supple with harsh right carotid bruit.  Healed tracheostomy scar and midline Cardiovascular: regular rate and rhythm, no murmurs Musculoskeletal: no deformity Skin:  no rash/petichiae Vascular:  Normal pulses all extremities  Neurologic Exam Mental Status: Awake and fully alert. Oriented to place and time. Recent and remote memory diminished attention span, concentration and fund of knowledge diminished. Mood and affect appropriate.  Mild dysarthria.  No aphasia.  Follows most commands. Cranial Nerves: Fundoscopic exam reveals sharp disc margins. Pupils equal, briskly reactive to light. Extraocular movements full without nystagmus. Visual fields full to confrontation. Hearing intact. Facial sensation intact. Face, tongue, palate moves normally and symmetrically.  Motor: Mild weakness of left grip and intrinsic hand muscles.  Orbits right over left upper upper extremity.  Mild weakness of hip flexors and ankle dorsiflexors on the left.   Sensory.: intact to touch , pinprick , position and vibratory sensation.  Coordination: Slightly impaired finger-to-nose coordination on the left compared to the right.  Knee to heel coordination slow on both sides. Gait and Station: Arises from chair with  difficulty. Stance is broad-based.  Gait is unsteady with favoring of the right hip and uses a walker. Reflexes: 1+ and symmetric. Toes downgoing.   NIHSS  3 Modified Rankin  3   ASSESSMENT: 70 year old Caucasian male with right parietal MCA branch infarct from possible multiple etiologies-high-grade proximal right common carotid artery stenosis versus atrial fibrillation.  He had prolonged hospitalization requiring tracheostomy and PEG tube and had seizure  upon arrival but now seems to be improving though still has gait and balance difficulties.     PLAN:I had a long d/w patient about his recent stroke, symptomatic right carotid restenosis and atrial fibrillation risk for recurrent stroke/TIAs, personally independently reviewed imaging studies and stroke evaluation results and answered questions.Continue Eliquis (apixaban) 5 mg twice daily  for secondary stroke prevention  and maintain strict control of hypertension with blood pressure goal below 130/90, diabetes with hemoglobin A1c goal below 6.5% and lipids with LDL cholesterol goal below 70 mg/dL. I also advised the patient to eat a healthy diet with plenty of whole grains, cereals, fruits and vegetables, exercise regularly and maintain ideal body weight .he was advised to use his cane at all times and we discussed fall safety precautions.  Referred to Dr. Chestine Spore vascular surgeon to consider a right carotid revascularization for his restenosis. Continue Keppra at the current dose for seizure prophylaxis Followup in the future with me in 6 months or call earlier if necessary.  Greater than 50% time during this 45-minute consultation visit was spent on counseling and coordination of care about his stroke and seizures and carotid stenosis and A. fib and answering questions.  Delia Heady MD   Note: This document was prepared with digital dictation and possible smart phrase technology. Any transcriptional errors that result from this process are unintentional.

## 2021-07-17 ENCOUNTER — Other Ambulatory Visit: Payer: Self-pay

## 2021-07-17 DIAGNOSIS — I6529 Occlusion and stenosis of unspecified carotid artery: Secondary | ICD-10-CM

## 2021-07-24 ENCOUNTER — Encounter: Payer: Medicare Other | Admitting: Vascular Surgery

## 2021-07-24 ENCOUNTER — Encounter (HOSPITAL_COMMUNITY): Payer: Medicare Other

## 2021-08-15 ENCOUNTER — Ambulatory Visit (INDEPENDENT_AMBULATORY_CARE_PROVIDER_SITE_OTHER): Payer: Medicare Other | Admitting: Vascular Surgery

## 2021-08-15 ENCOUNTER — Encounter: Payer: Self-pay | Admitting: Vascular Surgery

## 2021-08-15 ENCOUNTER — Ambulatory Visit (HOSPITAL_COMMUNITY)
Admission: RE | Admit: 2021-08-15 | Discharge: 2021-08-15 | Disposition: A | Discharge status: Home / Self Care | Payer: Medicare Other | Source: Ambulatory Visit | Attending: Vascular Surgery | Admitting: Vascular Surgery

## 2021-08-15 ENCOUNTER — Other Ambulatory Visit: Payer: Self-pay

## 2021-08-15 VITALS — BP 122/68 | HR 64 | Temp 98.0°F | Resp 20 | Ht 62.0 in | Wt 134.0 lb

## 2021-08-15 DIAGNOSIS — I6529 Occlusion and stenosis of unspecified carotid artery: Secondary | ICD-10-CM | POA: Insufficient documentation

## 2021-08-15 NOTE — Progress Notes (Signed)
Patient ID: Adam Mcclain, male   DOB: [birth year], 70 y.o.   MRN: [id number]  Reason for Consult: New Patient (Initial Visit)   Referred by Garvin Fila, MD  Subjective:     HPI:  Adam Mcclain is a 70 y.o. male admitted with right-sided stroke last May found to have 80% right common carotid artery stenosis likely secondary to clamp injury from previous carotid endarterectomy.  Patient has chronic A. fib for which she is anticoagulated.  Currently staying in [place] with hopes to get home.  No residual symptoms from his stroke.  He has recently seen Dr. Marcheta Grammes and referred back for evaluation of his common carotid stenosis.  He is in wheelchair today but states that he is able to walk without any deficits.  Past Medical History:  Diagnosis Date   A-fib ([place])    Acute on chronic respiratory failure with hypoxia (HCC)    Acute stroke due to ischemia [facility]    Anxiety    Arthritis    Atrial fibrillation (HCC)    Chronic back pain    COPD (chronic obstructive pulmonary disease) (HCC)    Dysrhythmia    A-Fib   Gastrostomy status (HCC)    GERD (gastroesophageal reflux disease)    History of kidney stones    HTN (hypertension)    Hypertension    Nephrolithiasis    PAD (peripheral artery disease) (HCC)    Paroxysmal atrial fibrillation (HCC)    PVD (peripheral vascular disease) (HCC)    Seizure disorder ([place])    Stroke ([place])    Tracheostomy status ([place])    Family History  Problem Relation Age of Onset   Hypertension Father    Diabetes Father    Cancer Father    Past Surgical History:  Procedure Laterality Date   ABDOMINAL AORTAGRAM  03/25/2013   with Stent   ABDOMINAL AORTAGRAM N/A 03/25/2013   Procedure: ABDOMINAL Maxcine Ham;  Surgeon: Conrad [place], MD;  Location: [facility] C[facility] LAB;  Service: Cardiovascular;  Laterality: N/A;   AORTOGRAM     CATARACT EXTRACTION Bilateral    CATARACT EXTRACTION, BILATERAL     CHOLECYSTECTOMY     ENDARTERECTOMY Right 06/26/2016    Procedure: ENDARTERECTOMY CAROTID RIGHT;  Surgeon: Conrad [place], MD;  Location: [place];  Service: Vascular;  Laterality: Right;   EYE SURGERY     INTRAMEDULLARY (IM) NAIL INTERTROCHANTERIC Left 03/03/2021   Procedure: INTRAMEDULLARY (IM) NAIL INTERTROCHANTRIC;  Surgeon: Marchia Bond, MD;  Location: [place];  Service: Orthopedics;  Laterality: Left;   IR GASTROSTOMY TUBE MOD SED  02/20/2021   IR GASTROSTOMY TUBE REMOVAL  05/11/2021   KIDNEY STONE SURGERY     LOWER EXTREMITY ANGIOGRAM N/A 03/25/2013   Procedure: LOWER EXTREMITY ANGIOGRAM;  Surgeon: Conrad [place], MD;  Location: [facility] CATH LAB;  Service: Cardiovascular;  Laterality: N/A;   PATCH ANGIOPLASTY Right 06/26/2016   Procedure: PATCH ANGIOPLASTY RIGHT CAROTID ARTERY;  Surgeon: Conrad [place], MD;  Location: [place];  Service: Vascular;  Laterality: Right;   PERCUTANEOUS STENT INTERVENTION Right 03/25/2013   Procedure: PERCUTANEOUS STENT INTERVENTION;  Surgeon: Conrad [place], MD;  Location: [facility] CATH LAB;  Service: Cardiovascular;  Laterality: Right;  icast stent to rt common iliac artery   PERIPHERAL VASCULAR CATHETERIZATION N/A 07/18/2016   Procedure: Abdominal Aortogram w/Lower Extremity;  Surgeon: Conrad Hugo, MD;  Location: [place] CV LAB;  Service: Cardiovascular;  Laterality: N/A;   PERIPHERAL VASCULAR CATHETERIZATION Right 07/18/2016   Procedure:  Peripheral Vascular Balloon Angioplasty;  Surgeon: Conrad [place], MD;  Location: [place] CV LAB;  Service: Cardiovascular;  Laterality: Right;  Common Iliac   TRACHEOSTOMY TUBE PLACEMENT     YAG LASER APPLICATION Left [id number]   Procedure: YAG LASER APPLICATION;  Surgeon: Williams Che, MD;  Location: [facility];  Service: Ophthalmology;  Laterality: Left;    Short Social History:  Social History   Tobacco Use   Smoking status: Every Day    Types: Cigars   Smokeless tobacco: Never   Tobacco comments:    7 cigars a day  Substance Use Topics   Alcohol use: Not Currently    No Known  Allergies  Current Outpatient Medications  Medication Sig Dispense Refill   albuterol (PROVENTIL) (2.5 MG/3ML) 0.083% nebulizer solution Take 3 mLs by nebulization every morning.     Amino Acids (AMINO ACID PO) 30 mLs by PEG Tube route daily. Amino acids liquid     amLODipine (NORVASC) 10 MG tablet Place 1 tablet (10 mg total) into feeding tube daily. 30 tablet 1   apixaban (ELIQUIS) 5 MG TABS tablet Place 1 tablet (5 mg total) into feeding tube 2 (two) times daily. 1 tablet 0   atorvastatin (LIPITOR) 40 MG tablet Place 1 tablet (40 mg total) into feeding tube daily. 1 tablet 0   clonazePAM (KLONOPIN) 0.5 MG tablet Place 1 tablet (0.5 mg total) into feeding tube at bedtime. 5 tablet 0   ferrous sulfate 300 (60 Fe) MG/5ML syrup Place 5 mLs (300 mg total) into feeding tube daily with breakfast.     folic acid (FOLVITE) 1 MG tablet Place 1 tablet (1 mg total) into feeding tube daily. 1 tablet 0   HYDROcodone-acetaminophen (NORCO) 5-325 MG tablet Take 1-2 tablets by mouth every 6 (six) hours as needed for moderate pain or severe pain. MAXIMUM TOTAL ACETAMINOPHEN DOSE IS 4000 MG PER DAY 20 tablet 0   ipratropium-albuterol (DUONEB) 0.5-2.5 (3) MG/3ML SOLN Take 3 mLs by nebulization every 6 (six) hours as needed. 1 mL 0   levETIRAcetam (KEPPRA) 100 MG/ML solution Place 10 mLs (1,000 mg total) into feeding tube 2 (two) times daily. 473 mL 12   Multiple Vitamin (MULTIVITAMIN WITH MINERALS) TABS tablet Place 1 tablet into feeding tube daily. 1 tablet 0   Nutritional Supplements (FEEDING SUPPLEMENT, OSMOLITE 1.5 CAL,) LIQD Place 1,000 mLs into feeding tube continuous. 55 ml/hr.  0   Nutritional Supplements (FEEDING SUPPLEMENT, PROSOURCE TF,) liquid Place 45 mLs into feeding tube daily.     ondansetron (ZOFRAN) 4 MG tablet Place 1 tablet (4 mg total) into feeding tube every 6 (six) hours as needed for nausea.     pantoprazole sodium (PROTONIX) 40 mg/20 mL PACK Place 20 mLs (40 mg total) into feeding tube  daily. 1 mL 1   QUEtiapine (SEROQUEL) 100 MG tablet Place 1 tablet (100 mg total) into feeding tube 2 (two) times daily. 1 tablet 0   Skin Protectants, Misc. (EUCERIN) cream Apply 1 application topically as needed for dry skin.     thiamine 100 MG tablet Place 100 mg into feeding tube daily.     vitamin B-12 (CYANOCOBALAMIN) 500 MCG tablet Place 500 mcg into feeding tube daily.     Water For Irrigation, Sterile (FREE WATER) SOLN Place 100 mLs into feeding tube every 4 (four) hours.     No current facility-administered medications for this visit.    Review of Systems  Constitutional:  Constitutional negative. HENT: HENT negative.  Eyes:  Eyes negative.  Respiratory: Respiratory negative.  Cardiovascular: Cardiovascular negative.  GI: Gastrointestinal negative.  Musculoskeletal: Musculoskeletal negative.  Skin: Skin negative.  Neurological: Neurological negative. Hematologic: Hematologic/lymphatic negative.  Psychiatric: Psychiatric negative.       Objective:  Objective   Vitals:   08/15/21 1512  BP: 122/68  Pulse: 64  Resp: 20  Temp: 98 F (36.7 C)  SpO2: 98%  Weight: 134 lb (60.8 kg)  Height: 5\' 2"  (1.575 m)   Body mass index is 24.51 kg/m.  Physical Exam Constitutional:      Appearance: Normal appearance.  HENT:     Head: Normocephalic.     Nose:     Comments: Wearing a mask Eyes:     Pupils: Pupils are equal, round, and reactive to light.  Neck:     Vascular: Carotid bruit present.  Cardiovascular:     Rate and Rhythm: Normal rate.     Heart sounds: Murmur heard.  Pulmonary:     Effort: Pulmonary effort is normal.  Abdominal:     General: Abdomen is flat.     Palpations: Abdomen is soft.  Skin:    General: Skin is warm and dry.     Capillary Refill: Capillary refill takes less than 2 seconds.  Neurological:     General: No focal deficit present.     Mental Status: He is alert and oriented to person, place, and time.  Psychiatric:        Mood and  Affect: Mood normal.        Behavior: Behavior normal.        Thought Content: Thought content normal.        Judgment: Judgment normal.    Data: Right Carotid Findings:  +----------+--------+--------+--------+------------------+--------+            PSV cm/sEDV cm/sStenosisPlaque DescriptionComments  +----------+--------+--------+--------+------------------+--------+  CCA Prox  55      17                                          +----------+--------+--------+--------+------------------+--------+  CCA Mid   143     24              heterogenous                +----------+--------+--------+--------+------------------+--------+  CCA Distal109     17              heterogenous                +----------+--------+--------+--------+------------------+--------+  ICA Prox  80      24      1-39%   heterogenous                +----------+--------+--------+--------+------------------+--------+  ICA Mid   91      24                                          +----------+--------+--------+--------+------------------+--------+  ICA Distal81      23                                          +----------+--------+--------+--------+------------------+--------+  ECA       481  66      >50%    heterogenous                +----------+--------+--------+--------+------------------+--------+   +----------+--------+-------+--------+-------------------+            PSV cm/sEDV cmsDescribeArm Pressure (mmHG)  +----------+--------+-------+--------+-------------------+  [id number]     10     Stenotic                     +----------+--------+-------+--------+-------------------+   +---------+--------+--+--------+--+---------+  VertebralPSV cm/s65EDV cm/s14Antegrade  +---------+--------+--+--------+--+---------+       Left Carotid Findings:  +----------+--------+--------+--------+-------------------------+--------+            PSV  cm/sEDV cm/sStenosisPlaque Description       Comments  +----------+--------+--------+--------+-------------------------+--------+  CCA Prox  120     29              heterogenous                       +----------+--------+--------+--------+-------------------------+--------+  CCA Mid   106     26              heterogenous                       +----------+--------+--------+--------+-------------------------+--------+  CCA Distal93      22              heterogenous                       +----------+--------+--------+--------+-------------------------+--------+  ICA Prox  262     78      60-79%  heterogenous and calcific          +----------+--------+--------+--------+-------------------------+--------+  ICA Mid   196     47              heterogenous                       +----------+--------+--------+--------+-------------------------+--------+  ICA Distal119     34                                                 +----------+--------+--------+--------+-------------------------+--------+  ECA       490             >50%                             NV        +----------+--------+--------+--------+-------------------------+--------+   +----------+--------+--------+--------+-------------------+            PSV cm/sEDV cm/sDescribeArm Pressure (mmHG)  +----------+--------+--------+--------+-------------------+  [id number]      6       dampened                     +----------+--------+--------+--------+-------------------+            Summary:  Right Carotid: Velocities in the right ICA are consistent with a 1-39%  stenosis.                 The ECA appears >50% stenosed.   Left Carotid: Velocities in the left ICA are consistent with a 60-79%  stenosis.                The ECA appears >  50% stenosed.   Vertebrals:  Right vertebral artery demonstrates antegrade flow. Left  vertebral               artery not visualized.   Subclavians: Left subclavian artery dampened. Right subclavian artery flow  is               turbulent..      Assessment/Plan:     70 year old male with previous right-sided stroke thought to be multifactorial in nature including atrial fibrillation for which she has now compliant on Eliquis.  He is staying at [facility] with hopes to get home soon.  He does not have any residual complaints from his recent stroke.  In review of his previous CT scan in May he had recurrent common carotid artery stenosis likely from a clamp injury.  It had been discussed to repair this.  Given that the CT is now 45 months old I will repeat a CTA of the head and neck see him back in 4 to 6 weeks for further discussion.  Given that he is on Eliquis and there is a very short landing zone I am not sure that he has a carotid stent candidate and would likely require repeat carotid endarterectomy of the common carotid artery.  I discussed this with the patient and his caregiver today they demonstrate good understanding we will get him scheduled for follow-up as noted above.    Maeola Harman MD [facility]

## 2021-08-18 ENCOUNTER — Other Ambulatory Visit
Admission: RE | Admit: 2021-08-18 | Discharge: 2021-08-18 | Disposition: A | Discharge status: Home / Self Care | Payer: Medicare Other | Source: Ambulatory Visit | Attending: Emergency Medicine | Admitting: Emergency Medicine

## 2021-08-18 DIAGNOSIS — R062 Wheezing: Secondary | ICD-10-CM | POA: Diagnosis present

## 2021-08-18 DIAGNOSIS — R0602 Shortness of breath: Secondary | ICD-10-CM | POA: Insufficient documentation

## 2021-08-18 LAB — BASIC METABOLIC PANEL
Anion gap: 8 (ref 5–15)
BUN: 20 mg/dL (ref 8–23)
CO2: 27 mmol/L (ref 22–32)
Calcium: 8.4 mg/dL — ABNORMAL LOW (ref 8.9–10.3)
Chloride: 103 mmol/L (ref 98–111)
Creatinine, Ser: 1.35 mg/dL — ABNORMAL HIGH (ref 0.61–1.24)
GFR, Estimated: 56 mL/min — ABNORMAL LOW (ref >60)
Glucose, Bld: 84 mg/dL (ref 70–99)
Potassium: 3.7 mmol/L (ref 3.5–5.1)
Sodium: 138 mmol/L (ref 135–145)

## 2021-08-18 LAB — CBC WITH DIFFERENTIAL/PLATELET
Abs Immature Granulocytes: 0.01 10*3/uL (ref 0.00–0.07)
Basophils Absolute: 0 10*3/uL (ref 0.0–0.1)
Basophils Relative: 0 %
Eosinophils Absolute: 0.1 10*3/uL (ref 0.0–0.5)
Eosinophils Relative: 3 %
HCT: 39.6 % (ref 39.0–52.0)
Hemoglobin: 12.8 g/dL — ABNORMAL LOW (ref 13.0–17.0)
Immature Granulocytes: 0 %
Lymphocytes Relative: 17 %
Lymphs Abs: 0.8 10*3/uL (ref 0.7–4.0)
MCH: 31.2 pg (ref 26.0–34.0)
MCHC: 32.3 g/dL (ref 30.0–36.0)
MCV: 96.6 fL (ref 80.0–100.0)
Monocytes Absolute: 0.4 10*3/uL (ref 0.1–1.0)
Monocytes Relative: 8 %
Neutro Abs: 3.4 10*3/uL (ref 1.7–7.7)
Neutrophils Relative %: 72 %
Platelets: 152 10*3/uL (ref 150–400)
RBC: 4.1 MIL/uL — ABNORMAL LOW (ref 4.22–5.81)
RDW: 13.2 % (ref 11.5–15.5)
WBC: 4.7 10*3/uL (ref 4.0–10.5)
nRBC: 0 % (ref 0.0–0.2)

## 2021-09-12 ENCOUNTER — Other Ambulatory Visit: Payer: Self-pay

## 2021-09-12 ENCOUNTER — Telehealth: Payer: Self-pay

## 2021-09-12 DIAGNOSIS — I6529 Occlusion and stenosis of unspecified carotid artery: Secondary | ICD-10-CM

## 2021-09-12 NOTE — Telephone Encounter (Signed)
Message was given to [employer] to call Scheduling back.

## 2021-09-12 NOTE — Telephone Encounter (Signed)
-----   Message from Marylynn Pearson sent at 09/12/2021  3:04 PM EST ----- Regarding: RE: CTA Per recording on patient's number, call can not be completed at this time.  Can you try to reach him and have him call us @ [phone]  Thanks, Sheralyn Boatman  ----- Message ----- From: Yolonda Kida, LPN Sent: 07/62/2633   2:31 PM EST To: April H Pait, Servando Salina, # Subject: CTA                                            PLEASE SCHEDULE AT [place] PRIOR TO APPT ON 01/04/203.  THANKS.

## 2021-09-18 ENCOUNTER — Other Ambulatory Visit: Payer: Self-pay

## 2021-09-18 ENCOUNTER — Other Ambulatory Visit: Payer: Self-pay | Admitting: Vascular Surgery

## 2021-09-18 ENCOUNTER — Ambulatory Visit (HOSPITAL_COMMUNITY): Payer: Medicare Other

## 2021-09-18 ENCOUNTER — Ambulatory Visit (HOSPITAL_COMMUNITY)
Admission: RE | Admit: 2021-09-18 | Discharge: 2021-09-18 | Disposition: A | Discharge status: Home / Self Care | Payer: Medicare Other | Source: Ambulatory Visit | Attending: Vascular Surgery | Admitting: Vascular Surgery

## 2021-09-18 DIAGNOSIS — I6529 Occlusion and stenosis of unspecified carotid artery: Secondary | ICD-10-CM

## 2021-09-18 MED ORDER — IOHEXOL 350 MG/ML SOLN
75.0000 mL | Freq: Once | INTRAVENOUS | Status: AC | PRN
  Administered 2021-09-18: 75 mL via INTRAVENOUS

## 2021-09-19 ENCOUNTER — Encounter: Payer: Self-pay | Admitting: Vascular Surgery

## 2021-09-19 ENCOUNTER — Ambulatory Visit (INDEPENDENT_AMBULATORY_CARE_PROVIDER_SITE_OTHER): Payer: Medicare Other | Admitting: Vascular Surgery

## 2021-09-19 VITALS — BP 89/63 | HR 82 | Temp 97.9°F | Resp 20 | Ht 62.0 in | Wt 134.0 lb

## 2021-09-19 DIAGNOSIS — I6529 Occlusion and stenosis of unspecified carotid artery: Secondary | ICD-10-CM | POA: Diagnosis not present

## 2021-09-19 NOTE — Progress Notes (Signed)
Patient ID: Adam Mcclain, male   DOB: 1950/12/31, 71 y.o.   MRN: [id number]  Reason for Consult: Follow-up   Referred by No ref. provider found  Subjective:     HPI:  Adam Mcclain is a 71 y.o. male previous history of carotid endarterectomy in 2017.  He is subsequently admitted with stroke last year found to have common carotid artery stenosis.  He was also found to be in atrial fibrillation on anticoagulation.  He now is taking Eliquis and has been compliant with this.  He is staying in [place].  He follows up with CT scan to reevaluate the common carotid artery stenosis today.  His chief complaint is right leg pain where he previously had fracture.  He is unsure what medications he is taking but he does have Eliquis and statin listed on his MAR from [facility].  Past Medical History:  Diagnosis Date   A-fib ([place])    Acute on chronic respiratory failure with hypoxia (HCC)    Acute stroke due to ischemia [facility])    Anxiety    Arthritis    Atrial fibrillation (HCC)    Chronic back pain    COPD (chronic obstructive pulmonary disease) (HCC)    Dysrhythmia    A-Fib   Gastrostomy status (HCC)    GERD (gastroesophageal reflux disease)    History of kidney stones    HTN (hypertension)    Hypertension    Nephrolithiasis    PAD (peripheral artery disease) (HCC)    Paroxysmal atrial fibrillation (HCC)    PVD (peripheral vascular disease) (HCC)    Seizure disorder ([place])    Stroke ([place])    Tracheostomy status ([place])    Family History  Problem Relation Age of Onset   Hypertension Father    Diabetes Father    Cancer Father    Past Surgical History:  Procedure Laterality Date   ABDOMINAL AORTAGRAM  03/25/2013   with Stent   ABDOMINAL AORTAGRAM N/A 03/25/2013   Procedure: ABDOMINAL Maxcine Ham;  Surgeon: Conrad Scales Mound, MD;  Location: [facility] C[facility] LAB;  Service: Cardiovascular;  Laterality: N/A;   AORTOGRAM     CATARACT EXTRACTION Bilateral    CATARACT EXTRACTION, BILATERAL      CHOLECYSTECTOMY     ENDARTERECTOMY Right 06/26/2016   Procedure: ENDARTERECTOMY CAROTID RIGHT;  Surgeon: Conrad Coldstream, MD;  Location: [place];  Service: Vascular;  Laterality: Right;   EYE SURGERY     INTRAMEDULLARY (IM) NAIL INTERTROCHANTERIC Left 03/03/2021   Procedure: INTRAMEDULLARY (IM) NAIL INTERTROCHANTRIC;  Surgeon: Marchia Bond, MD;  Location: [place];  Service: Orthopedics;  Laterality: Left;   IR GASTROSTOMY TUBE MOD SED  02/20/2021   IR GASTROSTOMY TUBE REMOVAL  05/11/2021   KIDNEY STONE SURGERY     LOWER EXTREMITY ANGIOGRAM N/A 03/25/2013   Procedure: LOWER EXTREMITY ANGIOGRAM;  Surgeon: Conrad Kent, MD;  Location: [facility] CATH LAB;  Service: Cardiovascular;  Laterality: N/A;   PATCH ANGIOPLASTY Right 06/26/2016   Procedure: PATCH ANGIOPLASTY RIGHT CAROTID ARTERY;  Surgeon: Conrad Jenks, MD;  Location: [place];  Service: Vascular;  Laterality: Right;   PERCUTANEOUS STENT INTERVENTION Right 03/25/2013   Procedure: PERCUTANEOUS STENT INTERVENTION;  Surgeon: Conrad Victor, MD;  Location: [facility] CATH LAB;  Service: Cardiovascular;  Laterality: Right;  icast stent to rt common iliac artery   PERIPHERAL VASCULAR CATHETERIZATION N/A 07/18/2016   Procedure: Abdominal Aortogram w/Lower Extremity;  Surgeon: Conrad Luzerne, MD;  Location: [place] CV LAB;  Service: Cardiovascular;  Laterality: N/A;   PERIPHERAL VASCULAR CATHETERIZATION Right 07/18/2016   Procedure: Peripheral Vascular Balloon Angioplasty;  Surgeon: Conrad Cumberland, MD;  Location: [place] CV LAB;  Service: Cardiovascular;  Laterality: Right;  Common Iliac   TRACHEOSTOMY TUBE PLACEMENT     YAG LASER APPLICATION Left [id number]   Procedure: YAG LASER APPLICATION;  Surgeon: Williams Che, MD;  Location: [facility];  Service: Ophthalmology;  Laterality: Left;    Short Social History:  Social History   Tobacco Use   Smoking status: Every Day    Types: Cigars   Smokeless tobacco: Never   Tobacco comments:    7 cigars a day  Substance  Use Topics   Alcohol use: Not Currently    No Known Allergies  Current Outpatient Medications  Medication Sig Dispense Refill   albuterol (PROVENTIL) (2.5 MG/3ML) 0.083% nebulizer solution Take 3 mLs by nebulization every morning.     Amino Acids (AMINO ACID PO) 30 mLs by PEG Tube route daily. Amino acids liquid     amLODipine (NORVASC) 10 MG tablet Place 1 tablet (10 mg total) into feeding tube daily. 30 tablet 1   apixaban (ELIQUIS) 5 MG TABS tablet Place 1 tablet (5 mg total) into feeding tube 2 (two) times daily. 1 tablet 0   atorvastatin (LIPITOR) 40 MG tablet Place 1 tablet (40 mg total) into feeding tube daily. 1 tablet 0   clonazePAM (KLONOPIN) 0.5 MG tablet Place 1 tablet (0.5 mg total) into feeding tube at bedtime. 5 tablet 0   ferrous sulfate 300 (60 Fe) MG/5ML syrup Place 5 mLs (300 mg total) into feeding tube daily with breakfast.     folic acid (FOLVITE) 1 MG tablet Place 1 tablet (1 mg total) into feeding tube daily. 1 tablet 0   HYDROcodone-acetaminophen (NORCO) 5-325 MG tablet Take 1-2 tablets by mouth every 6 (six) hours as needed for moderate pain or severe pain. MAXIMUM TOTAL ACETAMINOPHEN DOSE IS 4000 MG PER DAY 20 tablet 0   ipratropium-albuterol (DUONEB) 0.5-2.5 (3) MG/3ML SOLN Take 3 mLs by nebulization every 6 (six) hours as needed. 1 mL 0   levETIRAcetam (KEPPRA) 100 MG/ML solution Place 10 mLs (1,000 mg total) into feeding tube 2 (two) times daily. 473 mL 12   Multiple Vitamin (MULTIVITAMIN WITH MINERALS) TABS tablet Place 1 tablet into feeding tube daily. 1 tablet 0   Nutritional Supplements (FEEDING SUPPLEMENT, OSMOLITE 1.5 CAL,) LIQD Place 1,000 mLs into feeding tube continuous. 55 ml/hr.  0   Nutritional Supplements (FEEDING SUPPLEMENT, PROSOURCE TF,) liquid Place 45 mLs into feeding tube daily.     ondansetron (ZOFRAN) 4 MG tablet Place 1 tablet (4 mg total) into feeding tube every 6 (six) hours as needed for nausea.     pantoprazole sodium (PROTONIX) 40 mg/20  mL PACK Place 20 mLs (40 mg total) into feeding tube daily. 1 mL 1   QUEtiapine (SEROQUEL) 100 MG tablet Place 1 tablet (100 mg total) into feeding tube 2 (two) times daily. 1 tablet 0   Skin Protectants, Misc. (EUCERIN) cream Apply 1 application topically as needed for dry skin.     thiamine 100 MG tablet Place 100 mg into feeding tube daily.     vitamin B-12 (CYANOCOBALAMIN) 500 MCG tablet Place 500 mcg into feeding tube daily.     Water For Irrigation, Sterile (FREE WATER) SOLN Place 100 mLs into feeding tube every 4 (four) hours.     No current facility-administered medications for this visit.  Review of Systems  Constitutional:  Constitutional negative. HENT: HENT negative.  Eyes: Eyes negative.  Respiratory: Respiratory negative.  Cardiovascular: Cardiovascular negative.  GI: Gastrointestinal negative.  Musculoskeletal:       Right leg pain Skin: Skin negative.  Neurological: Positive for focal weakness.  Hematologic: Hematologic/lymphatic negative.  Psychiatric: Psychiatric negative.       Objective:  Objective   Vitals:   09/19/21 1025  BP: (!) 89/63  Pulse: 82  Resp: 20  Temp: 97.9 F (36.6 C)  SpO2: 98%  Weight: 134 lb (60.8 kg)  Height: 5\' 2"  (1.575 m)   Body mass index is 24.51 kg/m.  Physical Exam HENT:     Head: Normocephalic.     Nose:     Comments: Wearing a mask Neck:     Vascular: Carotid bruit present.  Cardiovascular:     Rate and Rhythm: Normal rate.     Comments: I cannot reliably feel femoral pulses but patient is currently in a wheelchair Abdominal:     General: Abdomen is flat.     Palpations: Abdomen is soft.  Skin:    General: Skin is dry.     Capillary Refill: Capillary refill takes 2 to 3 seconds.  Neurological:     Mental Status: He is alert.  Psychiatric:        Mood and Affect: Mood normal.        Thought Content: Thought content normal.        Judgment: Judgment normal.    Data: IMPRESSION: 1. No intracranial  occlusion or high-grade stenosis. 2. Slightly improved patency of right common carotid artery with reconfiguration of atheromatous plaque. No hemodynamically significant right ICA stenosis. 3. 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 4. Unchanged proximal left ICA stenosis measuring approximately 50%. 5. 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 6. Unchanged appearance of 50% stenosis of the proximal right subclavian artery. 7. 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       Assessment/Plan:     71 year old male with multifactorial right-sided stroke thought to be secondary to atrial fibrillation and also possibly clamp injury from previous carotid endarterectomy on the right.  His CT scan today looks much different with improved patency of the common carotid artery.  Do have to wonder if there was an acute thrombus at the previous clamp site at the time of his stroke which may have been from underlying atrial fibrillation.  Either way he has had no additional strokes and remains on Eliquis and a statin.  I do not see any necessity of intervention at this time.  We will follow up with carotid duplex in 6 months and also recheck an aortoiliac duplex from previous common iliac artery stenting with subsequent drug-coated balloon angioplasty.     Waynetta Sandy MD [facility]

## 2021-09-20 ENCOUNTER — Other Ambulatory Visit: Payer: Self-pay

## 2021-09-20 DIAGNOSIS — I6529 Occlusion and stenosis of unspecified carotid artery: Secondary | ICD-10-CM

## 2021-09-20 DIAGNOSIS — I7409 Other arterial embolism and thrombosis of abdominal aorta: Secondary | ICD-10-CM

## 2021-10-12 ENCOUNTER — Ambulatory Visit: Payer: Medicare Other | Admitting: Gastroenterology

## 2021-12-05 ENCOUNTER — Ambulatory Visit (INDEPENDENT_AMBULATORY_CARE_PROVIDER_SITE_OTHER): Payer: Medicare Other | Admitting: Gastroenterology

## 2021-12-05 ENCOUNTER — Other Ambulatory Visit: Payer: Self-pay

## 2021-12-05 ENCOUNTER — Encounter: Payer: Self-pay | Admitting: Gastroenterology

## 2021-12-05 DIAGNOSIS — K5900 Constipation, unspecified: Secondary | ICD-10-CM | POA: Insufficient documentation

## 2021-12-05 DIAGNOSIS — R7989 Other specified abnormal findings of blood chemistry: Secondary | ICD-10-CM | POA: Diagnosis not present

## 2021-12-05 NOTE — Progress Notes (Signed)
? ? ? ?GI Office Note   ? ?Referring Provider: No ref. provider found ?Primary Care Physician:  Pcp, No  ?Primary Gastroenterologist:Michael Rourk, MD ? ?Chief Complaint  ? ?Chief Complaint  ?Patient presents with  ? Constipation  ?  Pt states that he has a bm about every day but they are only small balls.   ? ?History of Present Illness  ? ?Adam Mcclain is a 71 y.o. male presenting today from [place] and rehabilitation for further evaluation of weight loss at the request of Earl Many, NP [phone]).  He presented with CNA who is not familiar with the patient.  No accompanying notes related to GI concerns or weight loss.  CNA mentioned that the nurse taking care of patient today had mentioned that patient complains of bowel habits.  Complains of constipation, if he takes something to help him go, then he gets diarrhea and requesting antidiarrheal medication.  MAR shows milk of magnesia and Fleet enemas available for use as needed.  Daily Metamucil. ? ?Patient is a difficult historian.  He is alert and oriented to person and place.  With several questions, he simply states he does not know.  According to admissions records from SNF he has a diagnosis of Alzheimer's disease he complains of having a hard time with bowel movements.  States he has 1 about every other day but passes [place] 1 small stools.  He says before he was in the nursing home he would go 2 to 3 months without a bowel movement.  He denies any blood in the stool or melena.  He states his appetite is good but he does not like the food.  He has been at the facility since June 2022.  He denies abdominal pain, heartburn, swallowing problems.  He does not believe he is ever had an EGD or colonoscopy.  ? ?CNA present today states that patient's primary nurse is [place].  Patient's contacts ? ?Patient has a history of hospitalization in June 2022 for left proximal femur fracture requiring surgery. ? ?Hospitalized in May 2022, presenting  via EMS from [place] office after patient became unresponsive.  He required intubation.  He was felt to have new onset seizure from previous left PCA infarction, acute right parietal stroke.  He had acute hypoxemic respiratory failure in the setting of seizure and aspiration pneumonia with Moraxella catarrhalis, managed with IV Unasyn, required tracheostomy.  He had gastrostomy tube placed via IR February 20, 2021, was able to have this removed May 11, 2021. ? ?Weights that I have available via epic: ?October 2018: 137 pounds ?Jan 21, 2021: 138 pounds ?Feb 01, 2021: 134 pounds ?August 15, 2021: 134 pounds ?September 19, 2021: 134 pounds ?Today: 140 pounds ? ?Right upper quadrant ultrasound May 2022: (for hyperbilirubinemia): ?IMPRESSION: ?1. Multiple hepatic cysts. ?2. Cholecystectomy. ?3. No biliary duct dilatation identified ? ?CT abdomen without contrast June 2022: (Evaluate anatomy prior to percutaneous gastrostomy tube placement) ?IMPRESSION: ?1. The anatomy should be amendable for a percutaneous gastrostomy ?tube. There is no bowel located between the stomach and anterior ?abdominal wall. Please note there is evidence for a small hiatal ?hernia. ?2. Focal collection of oral contrast in the precaval region. 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 ?3. Indeterminate 6 mm nodule at the left lung base. Non-contrast ?chest CT at 6-12 months is recommended. If the nodule is stable at ?time of repeat CT, then future CT at 18-24 months (from today's ?scan) is considered optional for low-risk patients,  but is ?recommended for high-risk patients. This recommendation follows the ?consensus statement: Guidelines for Management of Incidental ?Pulmonary Nodules Detected on CT Images: From the Fleischner Society ?2017; Radiology 2017; [id number]. ?4. Hepatic cysts.  Left renal cyst. ?5.  Aortic Atherosclerosis (ICD10-I70.0). ?  ? ? ?Medications  ? ?Current Outpatient Medications  ?Medication Sig Dispense  Refill  ? albuterol (PROVENTIL) (2.5 MG/3ML) 0.083% nebulizer solution Take 3 mLs by nebulization every morning.    ? Amino Acids (AMINO ACID PO) 30 mLs by PEG Tube route daily. Amino acids liquid    ? amLODipine (NORVASC) 10 MG tablet Place 1 tablet (10 mg total) into feeding tube daily. 30 tablet 1  ? apixaban (ELIQUIS) 5 MG TABS tablet Place 1 tablet (5 mg total) into feeding tube 2 (two) times daily. 1 tablet 0  ? atorvastatin (LIPITOR) 40 MG tablet Place 1 tablet (40 mg total) into feeding tube daily. 1 tablet 0  ? citalopram (CELEXA) 10 MG tablet Take 10 mg by mouth daily.    ? citalopram (CELEXA) 20 MG tablet Take 20 mg by mouth daily.    ? clonazePAM (KLONOPIN) 0.5 MG tablet Place 1 tablet (0.5 mg total) into feeding tube at bedtime. 5 tablet 0  ? divalproex (DEPAKOTE) 125 MG DR tablet Take 125 mg by mouth 3 (three) times daily.    ? ferrous sulfate 300 (60 Fe) MG/5ML syrup Place 5 mLs (300 mg total) into feeding tube daily with breakfast.    ? folic acid (FOLVITE) 1 MG tablet Place 1 tablet (1 mg total) into feeding tube daily. 1 tablet 0  ? haloperidol (HALDOL) 2 MG/ML solution Take 1 mg by mouth daily. As needed for agitation    ? levETIRAcetam (KEPPRA) 100 MG/ML solution Place 10 mLs (1,000 mg total) into feeding tube 2 (two) times daily. 473 mL 12  ? mirtazapine (REMERON) 15 MG tablet Take 15 mg by mouth at bedtime.    ? Multiple Vitamin (MULTIVITAMIN WITH MINERALS) TABS tablet Place 1 tablet into feeding tube daily. 1 tablet 0  ? Nutritional Supplements (FEEDING SUPPLEMENT, OSMOLITE 1.5 CAL,) LIQD Place 1,000 mLs into feeding tube continuous. 55 ml/hr.  0  ? Nutritional Supplements (FEEDING SUPPLEMENT, PROSOURCE TF,) liquid Place 45 mLs into feeding tube daily.    ? pantoprazole (PROTONIX) 40 MG tablet Take 40 mg by mouth daily.    ? Psyllium (METAMUCIL) 28.3 % POWD Take by mouth.    ? Skin Protectants, Misc. (EUCERIN) cream Apply 1 application topically as needed for dry skin.    ? thiamine 100 MG  tablet Place 100 mg into feeding tube daily.    ? vitamin B-12 (CYANOCOBALAMIN) 500 MCG tablet Place 500 mcg into feeding tube daily.    ? Water For Irrigation, Sterile (FREE WATER) SOLN Place 100 mLs into feeding tube every 4 (four) hours.    ? ?No current facility-administered medications for this visit.  ? ? ?Allergies  ? ?Allergies as of 12/05/2021  ? (No Known Allergies)  ? ? ?Past Medical History  ? ?Past Medical History:  ?Diagnosis Date  ? A-fib ([place])   ? Acute on chronic respiratory failure with hypoxia (HCC)   ? Acute stroke due to ischemia [facility])   ? Anxiety   ? Arthritis   ? Atrial fibrillation ([place])   ? Chronic back pain   ? COPD (chronic obstructive pulmonary disease) ([place])   ? Dysrhythmia   ? A-Fib  ? Gastrostomy status ([place])   ? GERD (gastroesophageal reflux disease)   ?  History of kidney stones   ? HTN (hypertension)   ? Hypertension   ? Nephrolithiasis   ? PAD (peripheral artery disease) ([place])   ? Paroxysmal atrial fibrillation (HCC)   ? PVD (peripheral vascular disease) ([place])   ? Seizure disorder ([place])   ? Stroke [facility])   ? Tracheostomy status ([place])   ? ? ?Past Surgical History  ? ?Past Surgical History:  ?Procedure Laterality Date  ? ABDOMINAL AORTAGRAM  03/25/2013  ? with Stent  ? ABDOMINAL AORTAGRAM N/A 03/25/2013  ? Procedure: ABDOMINAL AORTAGRAM;  Surgeon: Conrad Haynes, MD;  Location: [facility] CATH LAB;  Service: Cardiovascular;  Laterality: N/A;  ? AORTOGRAM    ? CATARACT EXTRACTION Bilateral   ? CATARACT EXTRACTION, BILATERAL    ? CHOLECYSTECTOMY    ? ENDARTERECTOMY Right 06/26/2016  ? Procedure: ENDARTERECTOMY CAROTID RIGHT;  Surgeon: Conrad Penn Wynne, MD;  Location: [place];  Service: Vascular;  Laterality: Right;  ? EYE SURGERY    ? INTRAMEDULLARY (IM) NAIL INTERTROCHANTERIC Left 03/03/2021  ? Procedure: INTRAMEDULLARY (IM) NAIL INTERTROCHANTRIC;  Surgeon: Marchia Bond, MD;  Location: [place];  Service: Orthopedics;  Laterality: Left;  ? IR GASTROSTOMY TUBE MOD SED  02/20/2021  ? IR GASTROSTOMY TUBE  REMOVAL  05/11/2021  ? KIDNEY STONE SURGERY    ? LOWER EXTREMITY ANGIOGRAM N/A 03/25/2013  ? Procedure: LOWER EXTREMITY ANGIOGRAM;  Surgeon: Conrad Sagamore, MD;  Location: [facility] CATH LAB;  Service: Cardiovascular;

## 2021-12-05 NOTE — Patient Instructions (Signed)
Patient seen in the office today for unclear reasons.  Original referral was from September, for diagnosis of weight loss.  No records received otherwise. ?Please send copy of last 6 rounding notes by staff provider. ?Please send weights from last 6 months. ?Please send last 3 months of labs. ?Please let us know what medications that the patient has tried for constipation before.  He states that he has had medications in the past for constipation that caused diarrhea, does not want these again because it forced him to have to take antidiarrheal occasions. ?Please have patient's nurse call us at [phone] and provide [place] with more information regarding reason for today's visit and what nursing home provider was concerned about. ?

## 2021-12-06 ENCOUNTER — Telehealth: Payer: Self-pay | Admitting: Gastroenterology

## 2021-12-06 DIAGNOSIS — R7989 Other specified abnormal findings of blood chemistry: Secondary | ICD-10-CM | POA: Insufficient documentation

## 2021-12-06 NOTE — Progress Notes (Signed)
Spoke with Anda Latina, nursing supervisor at [facility].  Original GI referral for weight loss, at that time patient was having ongoing weight loss in the setting of tube feedings.  She notes over the past few months however he is actually eating well and gaining weight.  He does complain of bowel issues.  He goes from having constipation to diarrhea, never seems to get to a happy medium.  Otherwise patient seems to be doing well.  She confirmed that he gets Metamucil daily, only other regimens are as needed milk of magnesia, Fleet enema.  He also has as needed Imodium. ? ?She sent a copy of labs in November 2022: His hemoglobin was 12.1, platelets 127,000, white blood cell count 3500, creatinine 1.12, sodium 144, potassium 3.6, albumin 3.7, total bilirubin 0.3, alkaline phosphatase 119, AST 20, ALT 14. ? ?Given records now reviewed, discussion with Ms. Hill, would recommend: ?-Daily bowel regimen, Miralax 17 grams BID for 3 days, then once daily, hold for 24 hours if loose stool.  ?-I also updated Ms. Hill regarding pulmonary nodule incidentally seen on CT abdomen February 14, 2021 at [facility] that recommended 23-month follow-up.  I am not sure whether their providers are aware.  We will fax a copy to her, she will address with patient's provider. ?-return to our office as needed ?-call with questions or concerns. ? ? ? ?

## 2021-12-06 NOTE — Telephone Encounter (Signed)
Orders and CT abdomen, along with this telephone note has been faxed to [facility] as requested.  ?

## 2021-12-06 NOTE — Telephone Encounter (Signed)
? ?  Please fax orders to [facility] at [facility] at [phone]. ? ?MiraLAX 17 g mixed in 4 to 6 ounces of water twice daily for 3 days, then continue once daily.  (Hold MiraLAX 24 hours if patient has more than 2 loose stools in a day) ? ?CT abdomen from February 14, 2021 results to be faxed to [facility] at the request of Anda Latina so they can address pulmonary nodule with SNF provider.  ? ?Call us with any questions or concerns. ? ?Patient to return to our office as needed. ? ?

## 2021-12-19 ENCOUNTER — Ambulatory Visit: Payer: Medicare Other | Admitting: Neurology

## 2022-01-02 ENCOUNTER — Ambulatory Visit (INDEPENDENT_AMBULATORY_CARE_PROVIDER_SITE_OTHER): Payer: Medicare Other | Admitting: Neurology

## 2022-01-02 VITALS — BP 122/64 | HR 55

## 2022-01-02 DIAGNOSIS — Z8673 Personal history of transient ischemic attack (TIA), and cerebral infarction without residual deficits: Secondary | ICD-10-CM | POA: Diagnosis not present

## 2022-01-02 DIAGNOSIS — I482 Chronic atrial fibrillation, unspecified: Secondary | ICD-10-CM

## 2022-01-02 DIAGNOSIS — I63239 Cerebral infarction due to unspecified occlusion or stenosis of unspecified carotid arteries: Secondary | ICD-10-CM

## 2022-01-02 NOTE — Patient Instructions (Signed)
I had a long d/w patient about his   stroke, carotid restenosis and atrial fibrillation,risk for recurrent stroke/TIAs, personally independently reviewed imaging studies and stroke evaluation results and answered questions.Continue Eliquis (apixaban)  5 mg twice daily  for secondary stroke prevention and maintain strict control of hypertension with blood pressure goal below 130/90, diabetes with hemoglobin A1c goal below 6.5% and lipids with LDL cholesterol goal below 70 mg/dL. I also advised the patient to eat a healthy diet with plenty of whole grains, cereals, fruits and vegetables, exercise regularly and maintain ideal body weight Continue f/u with Dr Randie Heinz for carotid surveillance.Followup in the future with my NP in 6 months or call earlier if needed. ?Fall Prevention in the Home, Adult ?Falls can cause injuries and can happen to people of all ages. There are many things you can do to make your home safe and to help prevent falls. Ask for help when making these changes. ?What actions can I take to prevent falls? ?General Instructions ?Use good lighting in all rooms. Replace any light bulbs that burn out. ?Turn on the lights in dark areas. Use night-lights. ?Keep items that you use often in easy-to-reach places. Lower the shelves around your home if needed. ?Set up your furniture so you have a clear path. Avoid moving your furniture around. ?Do not have throw rugs or other things on the floor that can make you trip. ?Avoid walking on wet floors. ?If any of your floors are uneven, fix them. ?Add color or contrast paint or tape to clearly mark and help you see: ?Grab bars or handrails. ?First and last steps of staircases. ?Where the edge of each step is. ?If you use a stepladder: ?Make sure that it is fully opened. Do not climb a closed stepladder. ?Make sure the sides of the stepladder are locked in place. ?Ask someone to hold the stepladder while you use it. ?Know where your pets are when moving through your  home. ?What can I do in the bathroom? ? ?  ? ?Keep the floor dry. Clean up any water on the floor right away. ?Remove soap buildup in the tub or shower. ?Use nonskid mats or decals on the floor of the tub or shower. ?Attach bath mats securely with double-sided, nonslip rug tape. ?If you need to sit down in the shower, use a plastic, nonslip stool. ?Install grab bars by the toilet and in the tub and shower. Do not use towel bars as grab bars. ?What can I do in the bedroom? ?Make sure that you have a light by your bed that is easy to reach. ?Do not use any sheets or blankets for your bed that hang to the floor. ?Have a firm chair with side arms that you can use for support when you get dressed. ?What can I do in the kitchen? ?Clean up any spills right away. ?If you need to reach something above you, use a step stool with a grab bar. ?Keep electrical cords out of the way. ?Do not use floor polish or wax that makes floors slippery. ?What can I do with my stairs? ?Do not leave any items on the stairs. ?Make sure that you have a light switch at the top and the bottom of the stairs. ?Make sure that there are handrails on both sides of the stairs. Fix handrails that are broken or loose. ?Install nonslip stair treads on all your stairs. ?Avoid having throw rugs at the top or bottom of the stairs. ?Choose a carpet that  does not hide the edge of the steps on the stairs. ?Check carpeting to make sure that it is firmly attached to the stairs. Fix carpet that is loose or worn. ?What can I do on the outside of my home? ?Use bright outdoor lighting. ?Fix the edges of walkways and driveways and fix any cracks. ?Remove anything that might make you trip as you walk through a door, such as a raised step or threshold. ?Trim any bushes or trees on paths to your home. ?Check to see if handrails are loose or broken and that both sides of all steps have handrails. ?Install guardrails along the edges of any raised decks and porches. ?Clear  paths of anything that can make you trip, such as tools or rocks. ?Have leaves, snow, or ice cleared regularly. ?Use sand or salt on paths during winter. ?Clean up any spills in your garage right away. This includes grease or oil spills. ?What other actions can I take? ?Wear shoes that: ?Have a low heel. Do not wear high heels. ?Have rubber bottoms. ?Feel good on your feet and fit well. ?Are closed at the toe. Do not wear open-toe sandals. ?Use tools that help you move around if needed. These include: ?Canes. ?Walkers. ?Scooters. ?Crutches. ?Review your medicines with your doctor. Some medicines can make you feel dizzy. This can increase your chance of falling. ?Ask your doctor what else you can do to help prevent falls. ?Where to find more information ?Centers for Disease Control and Prevention, [employer]: FootballExhibition.com.br ?[employer] on Aging: https://walker.com/ ?Contact a doctor if: ?You are afraid of falling at home. ?You feel weak, drowsy, or dizzy at home. ?You fall at home. ?Summary ?There are many simple things that you can do to make your home safe and to help prevent falls. ?Ways to make your home safe include removing things that can make you trip and installing grab bars in the bathroom. ?Ask for help when making these changes in your home. ?This information is not intended to replace advice given to you by your health care provider. Make sure you discuss any questions you have with your health care provider. ?Document Revised: 06/04/2021 Document Reviewed: 04/05/2020 ?Elsevier Patient Education ? 2023 Elsevier Inc. ? ? ?

## 2022-01-02 NOTE — Progress Notes (Signed)
?[facility] ?[address] ?[place]. [place] [address] ?[phone] ? ?     OFFICE CONSULT NOTE ? ?[name] ?Date of Birth:  1951/03/02 ?Medical Record Number:  [id number]  ? ?Referring MD: Rosalin Hawking ? ?Reason for Referral: Stroke ? ?HPI: Initial visit 06/20/2021: Mr. Rowe Clack is a 71 year old Caucasian male seen today for initial office consult visit for stroke.  History is obtained from the patient.  He is accompanied today by [name], CNA from [facility].  I personally reviewed electronic medical records as well as pertinent available imaging films in PACS.Adam Mcclain  has PMHx of CVA, HTN, atrial fibrillation on aspirin and Plavix, COPD, and CKD who was brought in by EMS on 01/19/2021 from a parking lot where he was found to be shaking. The patient had been sitting in a car waiting on a family member who was in a doctors office. A bystander pulled up and noticed that the patient was looking to the left while shaking and was unresponsive. EMS was called and patient continued to be unresponsive when they arrived. The patient was administered 10 mg Versed in the field. He was being bagged on arrival to the ED and was emergently intubated. EMS stated that they were told the patient has a history of seizures, but no history of such can be found on review of outpatient clinic note in Epic.  He had several EEGs including long-term monitoring which showed only diffuse encephalopathy with no definite epileptiform activity. ?CT scan of the head showed no acute abnormality and old left PCA infarct was noted.  MRI scan showed tiny subcentimeter right parietal cortical infarcts along the margin of a chronic parieto-occipital infarct.  There is old chronic large left PCA as well as multiple chronic lacunar infarcts noted.  Carotid ultrasound showed 80 to 99% right common carotid artery and 60 to 79% left internal carotid artery stenosis.  Left subclavian artery showed no  resistant posttraumatic waveforms.  CT angiogram of the head and neck confirmed severe 80% right common carotid artery stenosis but showed 50% left ICA stenosis with calcified and soft plaques.  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  50% right subclavian artery stenosis and bilateral moderate carotid siphon stenosis.  Right M1 segment had a short segment severe stenosis.  2D echo showed ejection fraction of 60 to 65%.  LDL cholesterol 43 mg percent and hemoglobin A1c was 5.3.  Patient had respiratory failure and difficult to extubate and hence underwent tracheostomy and PEG tube.  He was initially transferred for rehab to his skilled nursing facility where he is made gradual improvement.  He is eating well and PEG tube is out.  He is breathing well and tracheostomy has also been out.  He is now able to stand with assistance but feels unsteady.  He is able to walk with a walker but mostly prefers using a cane.  He also had a fall on 03/03/2021 and suffered a right hip intertrochanteric fracture for which he underwent surgery by Dr. Mardelle Matte which went well.  He is able to now walk with a cane but still feels unsteady.  He was started on Eliquis which is tolerating well with minor bruising but no bleeding.  He has had no recurrent seizures.  Patient was seen by vascular surgery and the plan was for him to follow-up with Dr. Carlis Abbott as an  outpatient to consider right common carotid artery revascularization but this has not yet happened. ?Update 01/02/2022 : He returns for follow-up after last visit 5 months ago.  Patient states that he is doing well.  He is now able to walk short distances using a walker but uses a wheelchair for long distances.  He continues to have right hip pain which limits his walking.  He had CT angiogram on 09/18/2021 which showed 50% or less left ICA stenosis and  reconfiguration of the plaque with improved patency of the right common carotid artery with no hemodynamically significant stenosis.  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  Occlusion of proximal left subclavian artery with reconstitution distally.  He remains on Eliquis which is tolerating well without bleeding or bruising.  He is also tolerating Lipitor well without muscle aches and pains.  He remains on Keppra and has had no further seizures.  He did see vascular surgeon Dr. Donzetta Matters on 09/19/2021 who felt there was no need for any vascular surgery intervention at the present time.  He plans to repeat follow-up carotid ultrasound in July.  He continues to live at [facility] facility. ?ROS:   ?14 system review of systems is positive for weakness, difficulty breathing, difficulty swallowing, imbalance, walking difficulty, bruising all other systems negative ? ?PMH:  ?Past Medical History:  ?Diagnosis Date  ? A-fib ([place])   ? Acute on chronic respiratory failure with hypoxia (H[facility])   ? Acute stroke due to ischemia [facility])   ? Anxiety   ? Arthritis   ? Atrial fibrillation ([place])   ? Chronic back pain   ? COPD (chronic obstructive pulmonary disease) ([place])   ? Dysrhythmia   ? A-Fib  ? 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 GERD (gastroesophageal reflux disease)   ? History of kidney stones   ? HTN (hypertension)   ? Hypertension   ? Nephrolithiasis   ? PAD (peripheral artery disease) ([place])   ? Paroxysmal atrial fibrillation (H[facility])   ? PVD (peripheral vascular disease) ([place])   ? Seizure disorder ([place])   ? Stroke [facility])   ? Tracheostomy status ([place])   ? ? ?Social History:  ?Social History  ? ?Socioeconomic History  ? Marital status: Widowed  ?  Spouse name: Not on file  ? Number of children: Not on file  ? Years of education: Not on file  ? Highest education level: Not on file  ?Occupational History  ? Not on file  ?Tobacco Use  ? Smoking status: Every Day  ?  Types:  Cigars  ? Smokeless tobacco: Never  ? Tobacco comments:  ?  7 cigars a day  ?Vaping Use  ? Vaping Use: Never used  ?Substance and Sexual Activity  ? Alcohol use: Not Currently  ? Drug use: No  ? Sexual activity: Not Currently  ?Other Topics Concern  ? Not on file  ?Social History Narrative  ? ** Merged History Encounter **  ?    ? ?Social Determinants of Health  ? ?Financial Resource Strain: Not on file  ?Food Insecurity: Not on file  ?Transportation Needs: Not on file  ?Physical Activity: Not on file  ?Stress: Not on file  ?Social Connections: Not on file  ?Intimate Partner Violence: Not on file  ? ? ?Medications:   ?Current Outpatient Medications on File Prior to Visit  ?Medication Sig Dispense Refill  ? Amino Acids (AMINO ACID PO) 30 mLs by PEG Tube route daily.  Amino acids liquid    ? amLODipine (NORVASC) 10 MG tablet Place 1 tablet (10 mg total) into feeding tube daily. 30 tablet 1  ? apixaban (ELIQUIS) 5 MG TABS tablet Place 1 tablet (5 mg total) into feeding tube 2 (two) times daily. 1 tablet 0  ? atorvastatin (LIPITOR) 40 MG tablet Place 1 tablet (40 mg total) into feeding tube daily. 1 tablet 0  ? citalopram (CELEXA) 10 MG tablet Take 10 mg by mouth daily.    ? citalopram (CELEXA) 20 MG tablet Take 20 mg by mouth daily.    ? clonazePAM (KLONOPIN) 0.5 MG tablet Place 1 tablet (0.5 mg total) into feeding tube at bedtime. 5 tablet 0  ? divalproex (DEPAKOTE) 125 MG DR tablet Take 125 mg by mouth 3 (three) times daily.    ? ferrous sulfate 300 (60 Fe) MG/5ML syrup Place 5 mLs (300 mg total) into feeding tube daily with breakfast.    ? folic acid (FOLVITE) 1 MG tablet Place 1 tablet (1 mg total) into feeding tube daily. 1 tablet 0  ? haloperidol (HALDOL) 2 MG/ML solution Take 1 mg by mouth daily. As needed for agitation    ? levETIRAcetam (KEPPRA) 100 MG/ML solution Place 10 mLs (1,000 mg total) into feeding tube 2 (two) times daily. 473 mL 12  ? mirtazapine (REMERON) 15 MG tablet Take 15 mg by mouth at bedtime.     ? Multiple Vitamin (MULTIVITAMIN WITH MINERALS) TABS tablet Place 1 tablet into feeding tube daily. 1 tablet 0  ? Nutritional Supplements (FEEDING SUPPLEMENT, OSMOLITE 1.5 CAL,) LIQD Place 1,000 mLs into feeding tu

## 2022-02-08 IMAGING — DX DG CHEST 1V
1 series · 1 of 1 positions shown · non-contrast
Comparison: Portable exam 4228 hours compared to 01/23/2021

CLINICAL DATA: Intubation

EXAM:
CHEST  1 VIEW

[chest ap]
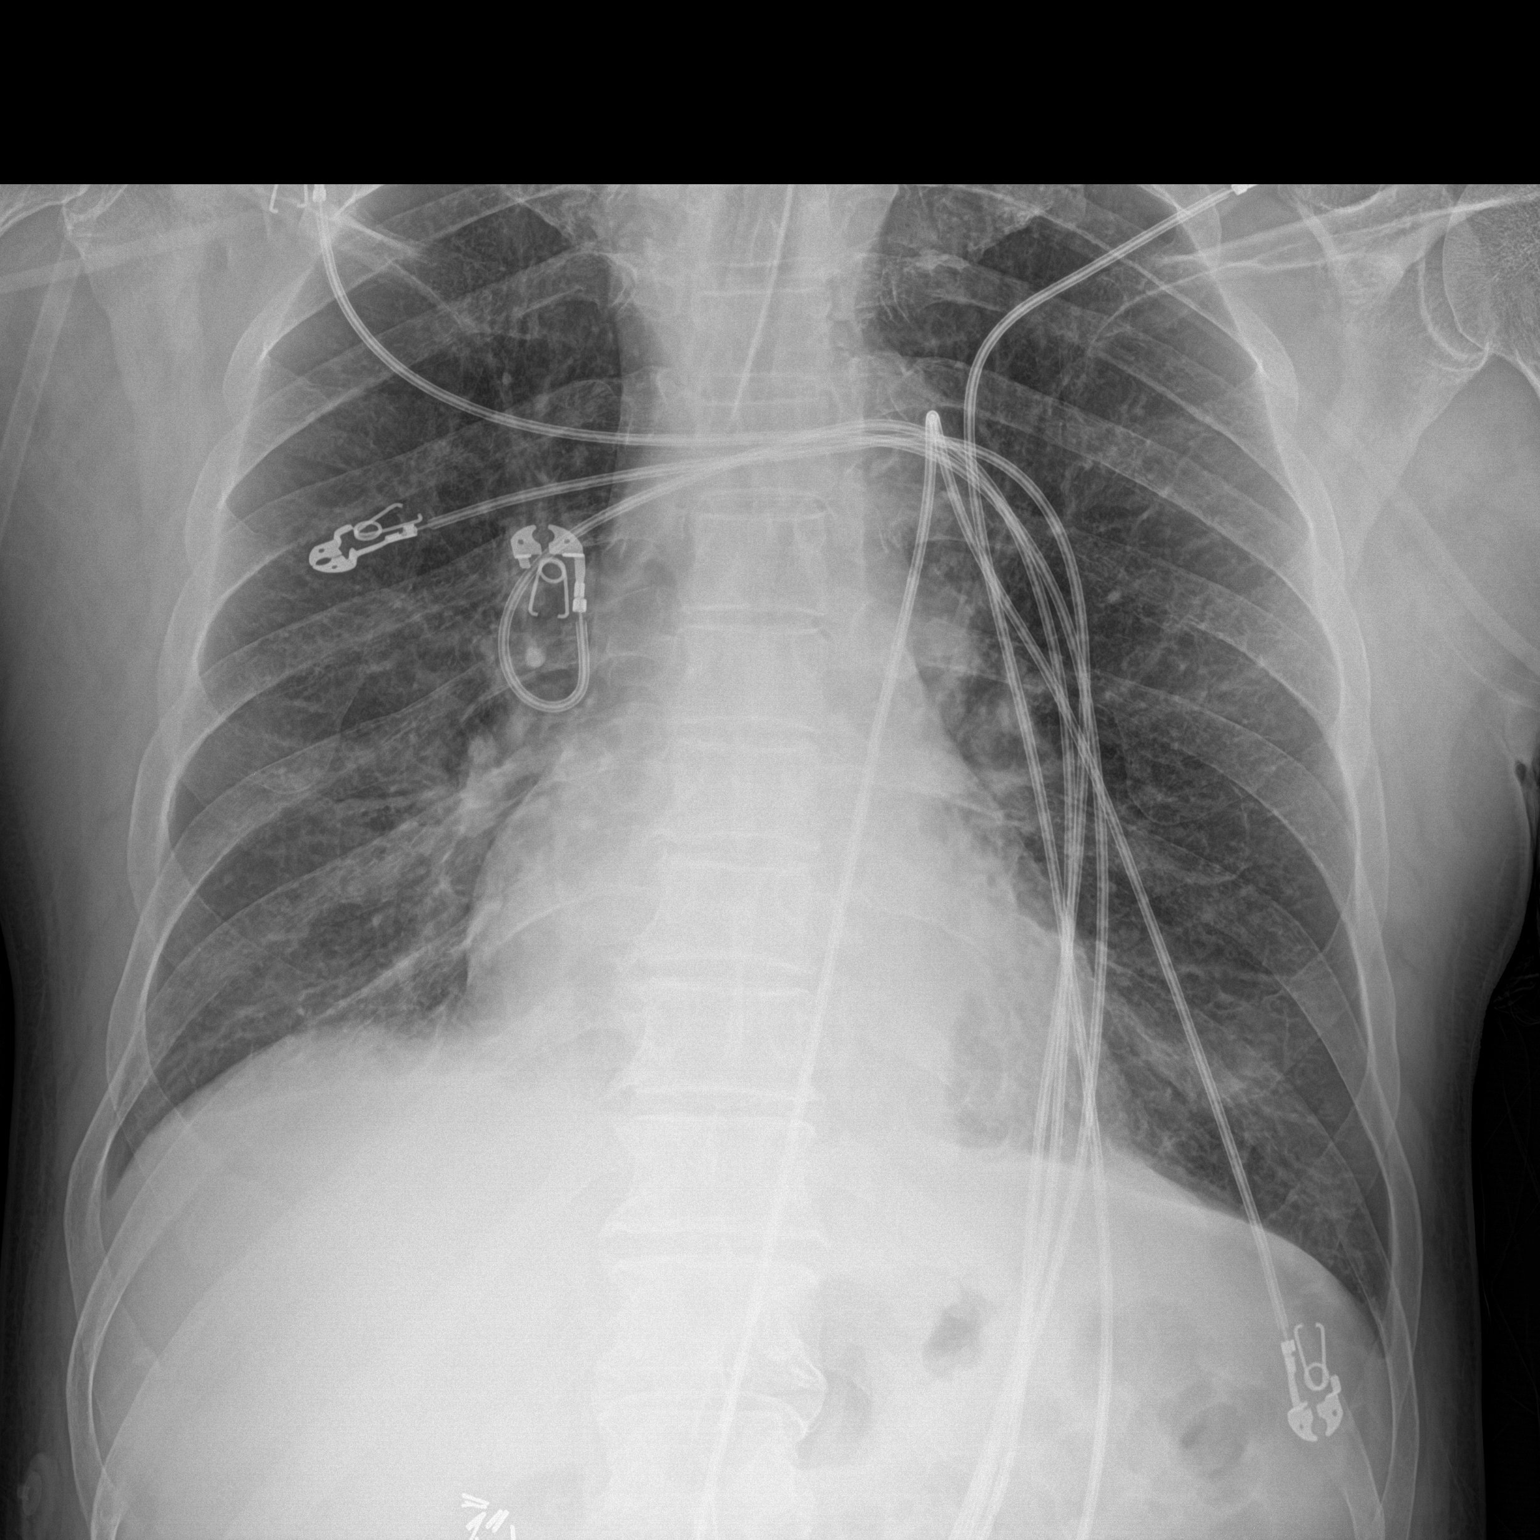

[1 of 1 positions shown; findings below may reference images not displayed]

FINDINGS: Tip of endotracheal tube projects 2.9 cm above carina.

Normal heart size, mediastinal contours, and pulmonary vascularity.

Minimal atelectasis at lung bases.

No definite infiltrate, pleural effusion or pneumothorax.
IMPRESSION: Minimal bibasilar atelectasis.

## 2022-02-08 IMAGING — DX DG ABDOMEN 1V
1 series · 2 of 2 positions shown · non-contrast
Comparison: None.

CLINICAL DATA: Check gastric catheter placement

EXAM:
ABDOMEN - 1 VIEW

[Series 1: abdomen · 0.14mm/px · 2 of 2 slices shown]
[im 1/2]
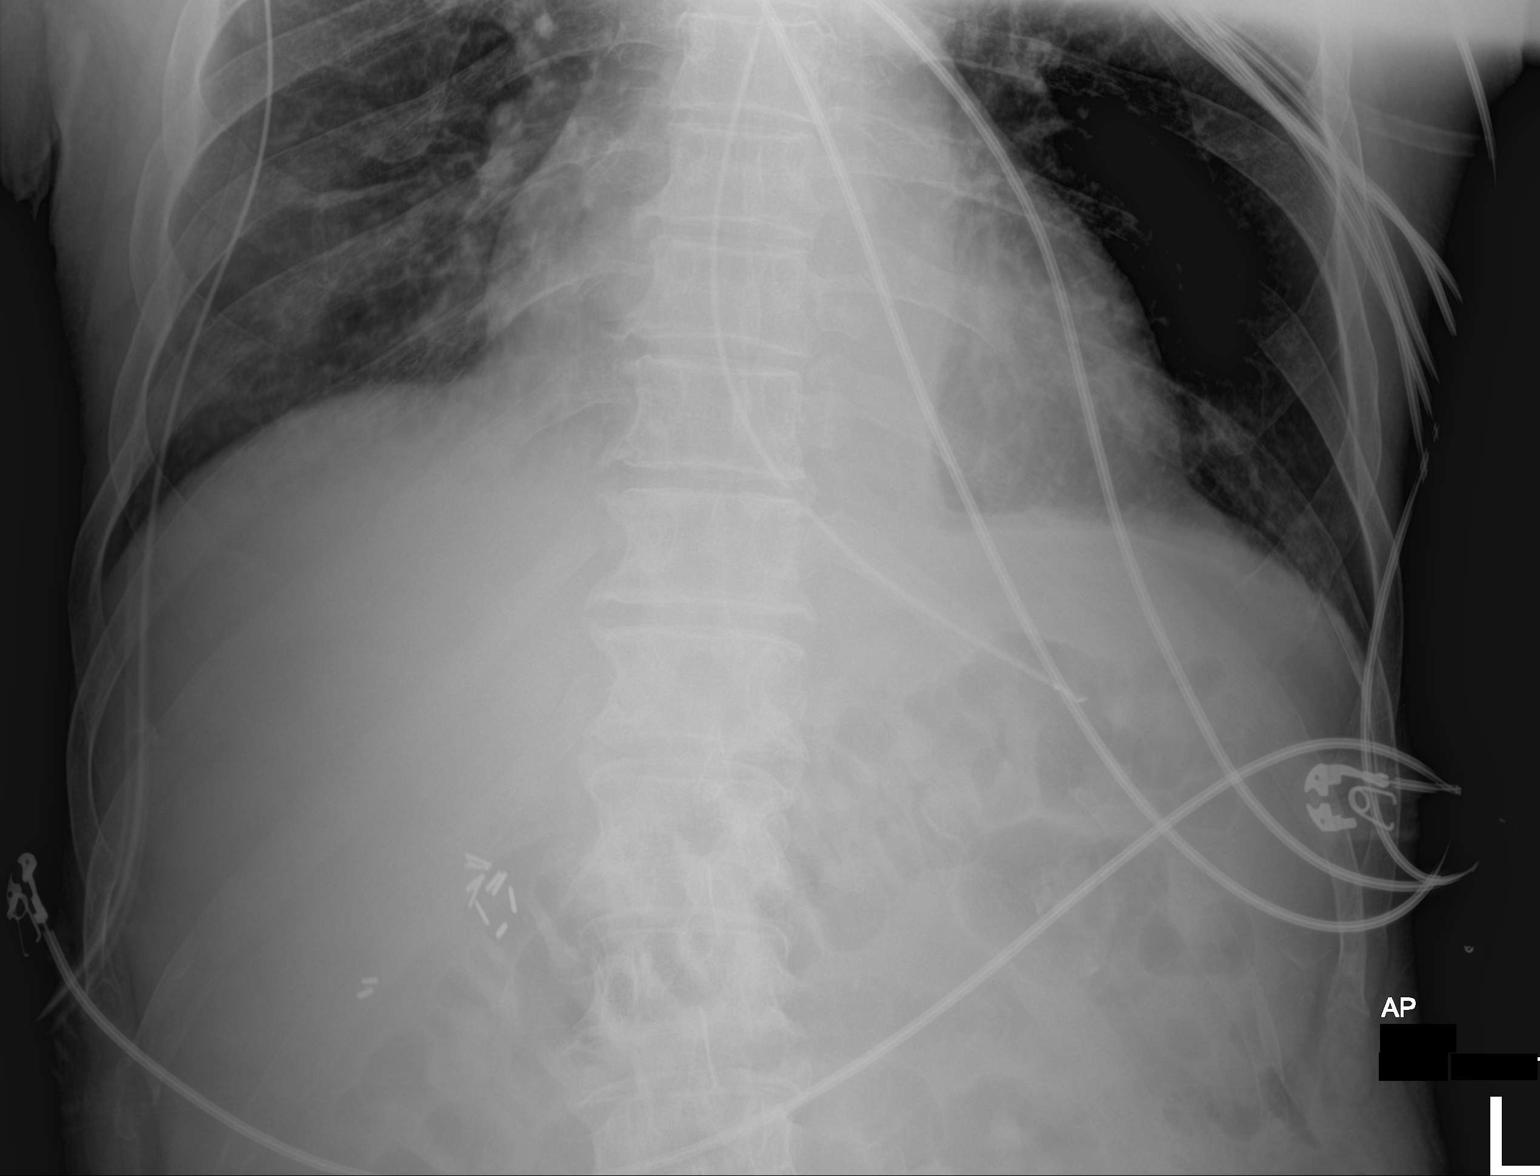
[im 2/2]
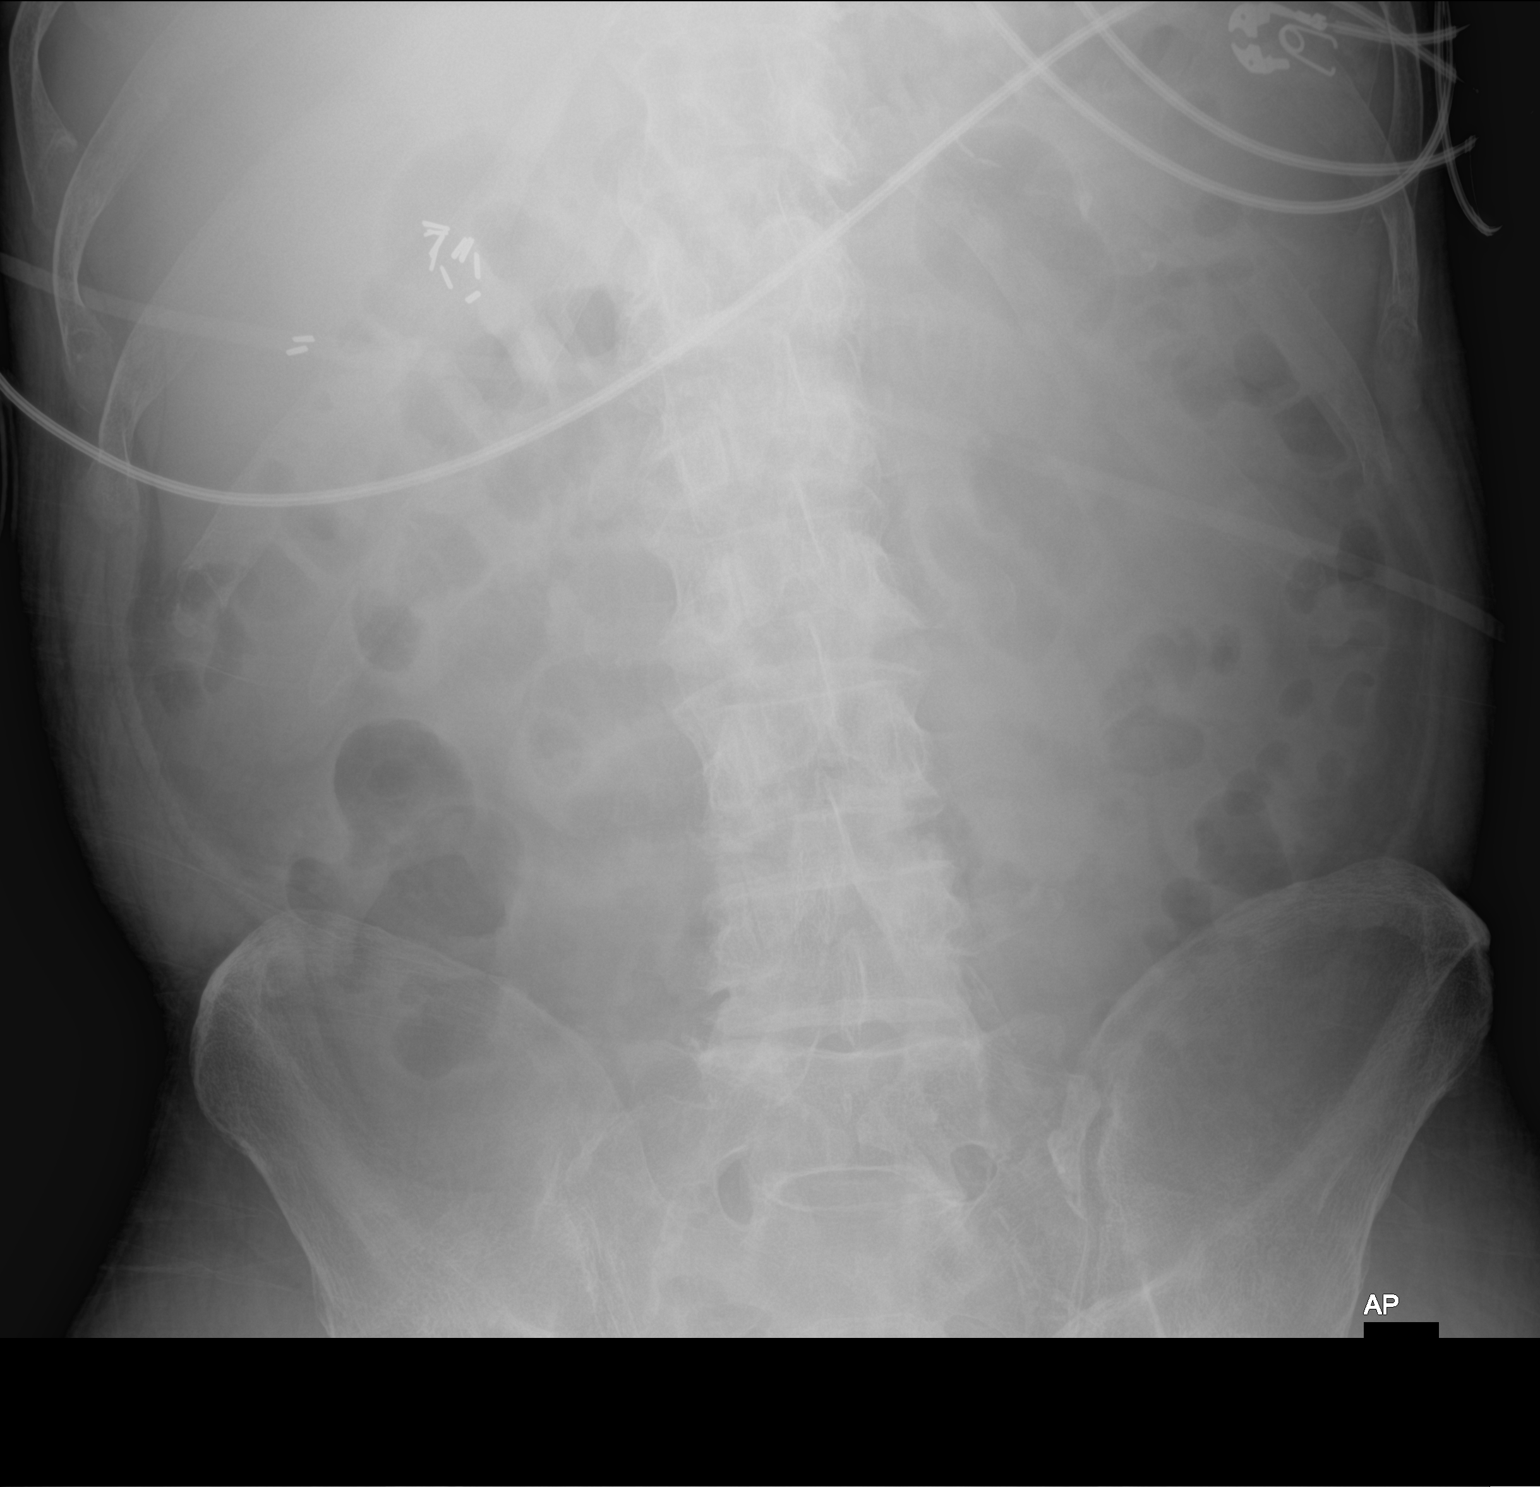

[2 of 2 positions shown; findings below may reference images not displayed]

FINDINGS: Gastric catheter is noted with the tip in the stomach. Proximal side
port lies in the distal esophagus however. This should be advanced
several cm deeper into the stomach.
IMPRESSION: Gastric catheter as described which should be advanced further into
the stomach.

## 2022-02-10 IMAGING — US US ABDOMEN LIMITED RUQ/ASCITES
1 series · 14 of 25 positions shown · non-contrast
Comparison: None.

CLINICAL DATA: Hyperbilirubinemia.

EXAM:
ULTRASOUND ABDOMEN LIMITED RIGHT UPPER QUADRANT

[Series 1: abdomen us · 14 of 40 slices shown]
[im 1/40]
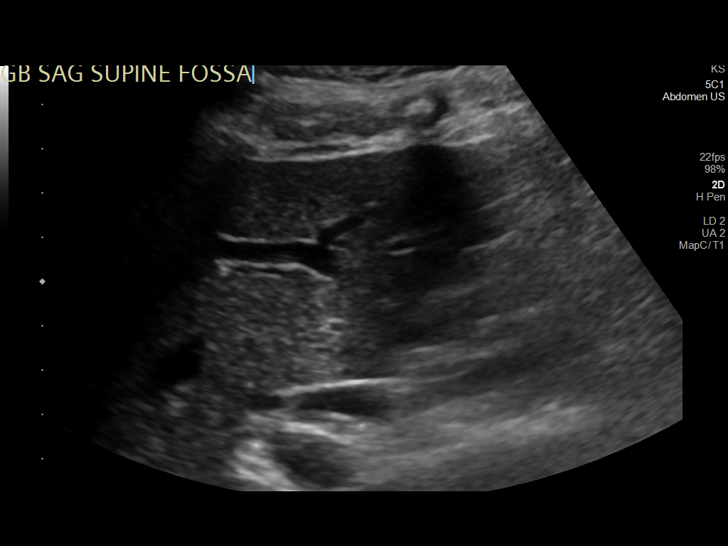
[im 4/40]
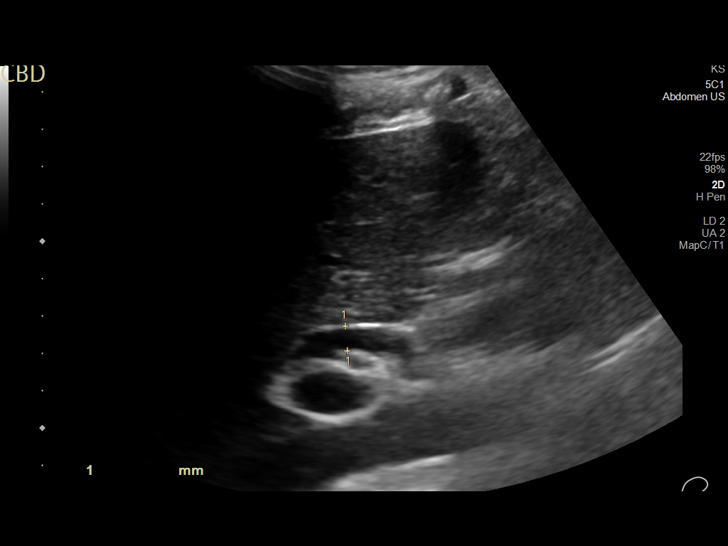
[im 7/40]
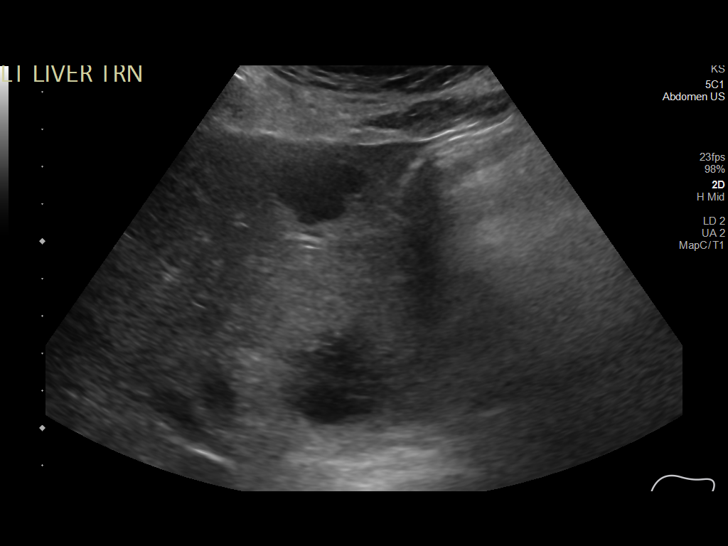
[im 10/40]
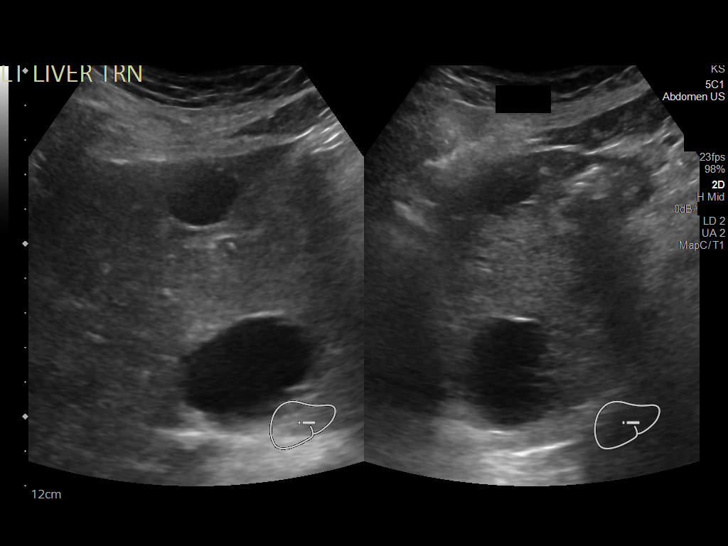
[im 14/40]
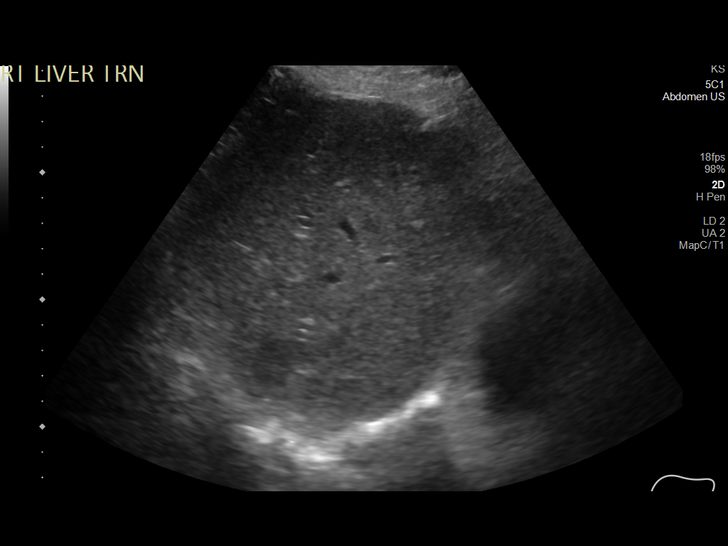
[im 15/40]
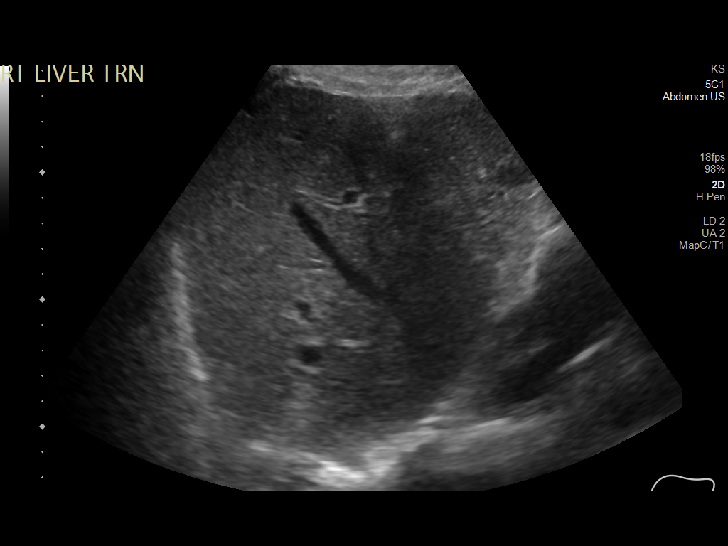
[im 18/40]
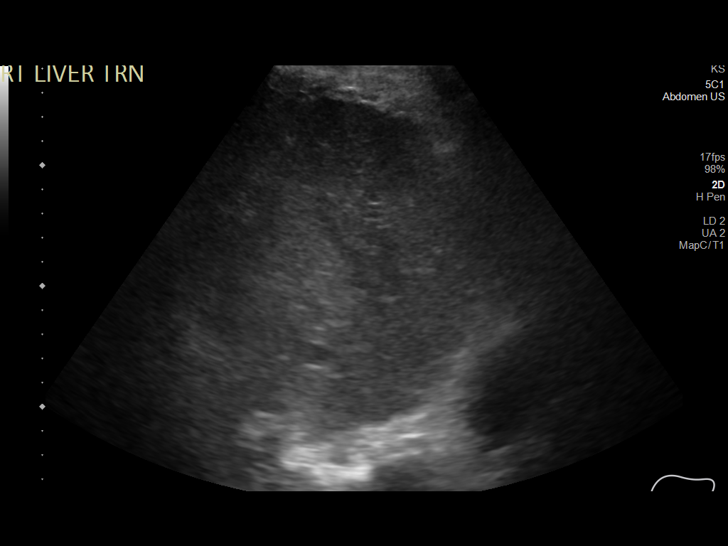
[im 22/40]
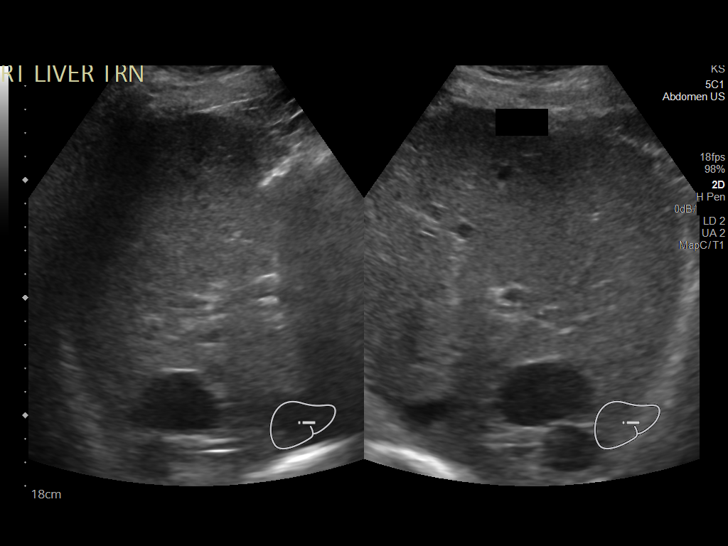
[im 25/40]
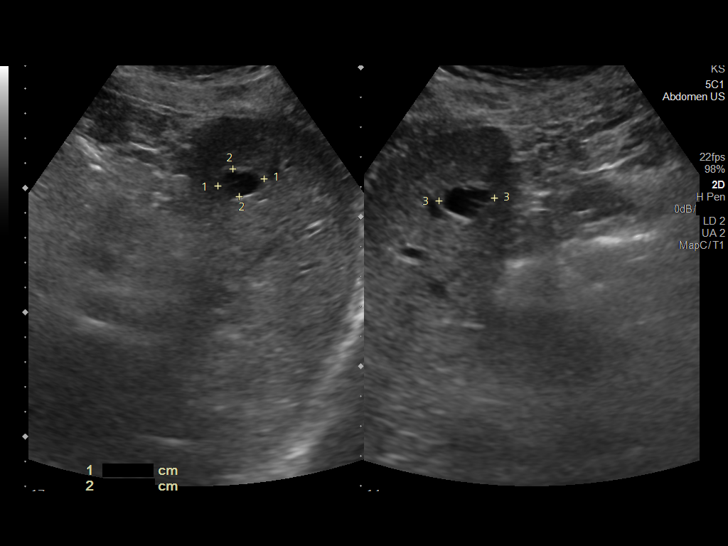
[im 27/40]
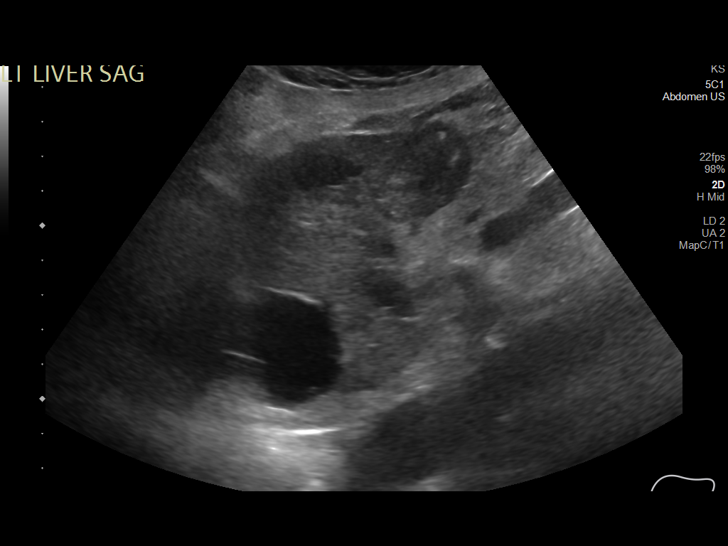
[im 30/40]
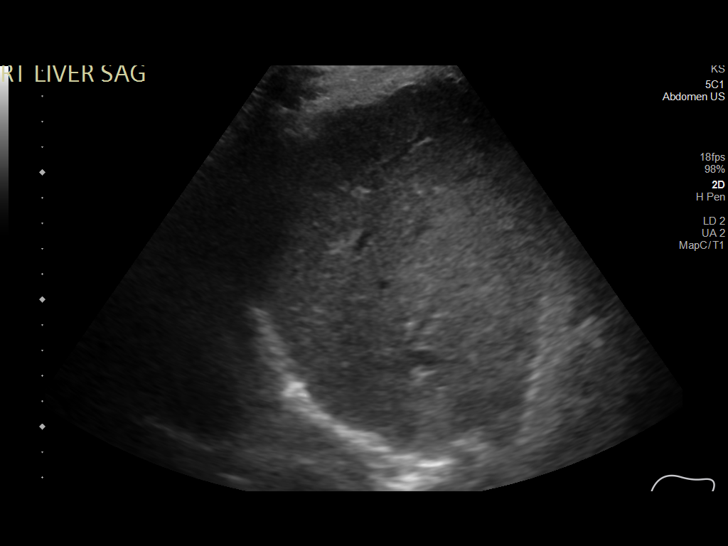
[im 33/40]
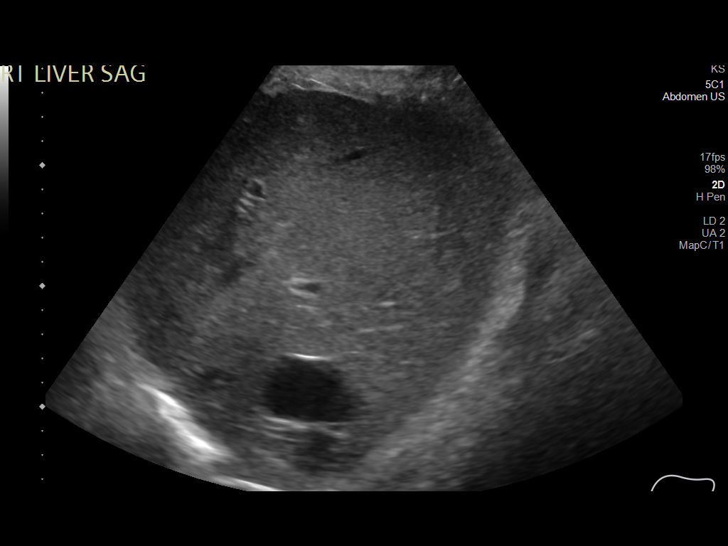
[im 36/40]
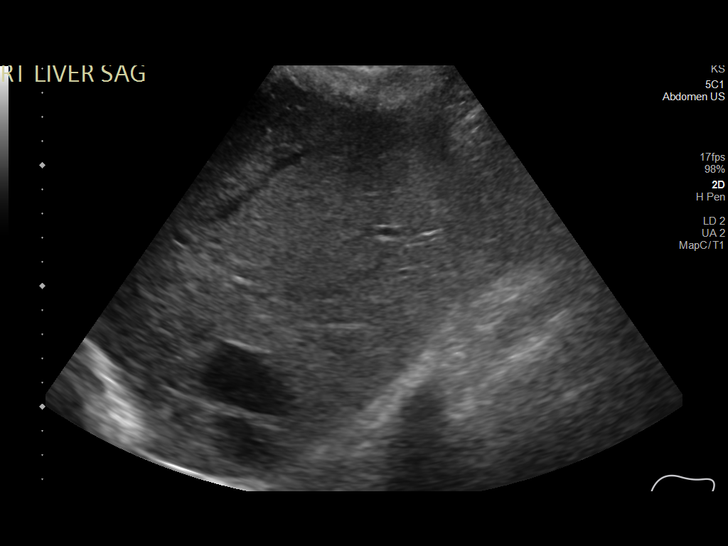
[im 40/40]
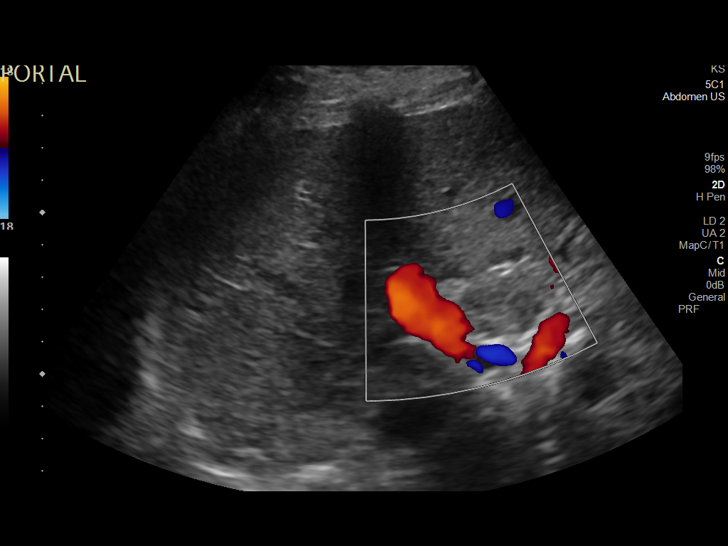

[14 of 25 positions shown; findings below may reference images not displayed]

FINDINGS: Gallbladder:

Gallbladder is surgically absent.

Common bile duct:

Diameter: 6.7 millimeter

Liver:

The liver parenchyma is normal in echogenicity. Numerous hepatic
cysts are identified. In the LEFT hepatic lobe, cyst is 3.0 x 3.0 x
4.0 centimeters. A second cyst is 2 x 2 x 2 centimeters. In the
RIGHT hepatic lobe, cyst is 4 x 3 x 3 centimeters. No solid or
suspicious liver masses. Portal vein is patent on color Doppler
imaging with normal direction of blood flow towards the liver.

Other: None.
IMPRESSION: 1. Multiple hepatic cysts.
2. Cholecystectomy.
3. No biliary duct dilatation identified.

## 2022-02-17 IMAGING — DX DG CHEST 1V PORT
1 series · 1 of 1 positions shown · non-contrast
Comparison: 01/29/2021

CLINICAL DATA: Shortness of breath

EXAM:
PORTABLE CHEST 1 VIEW

[chest]
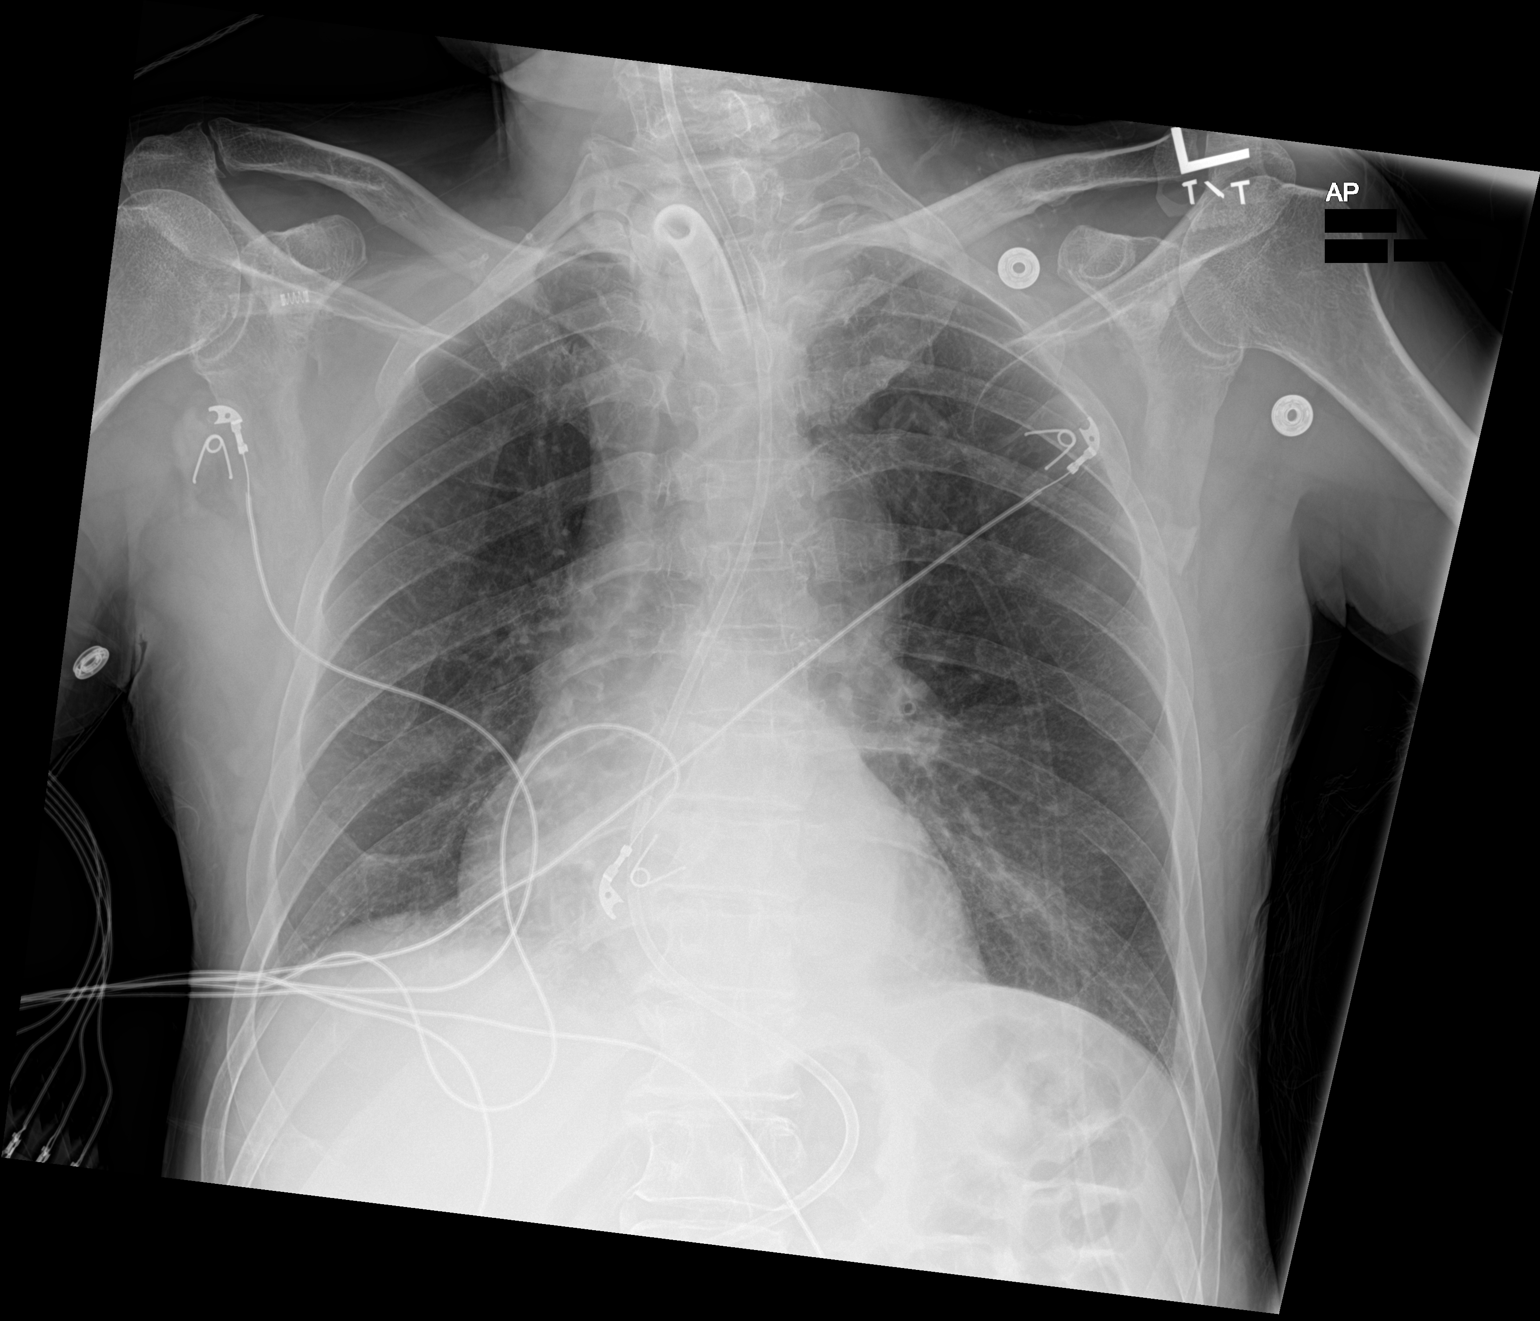

[1 of 1 positions shown; findings below may reference images not displayed]

FINDINGS: Tracheostomy and feeding tube remain in place, unchanged. No
confluent airspace opacities or effusions. Heart is normal size. No
visible effusions. No acute bony abnormality.
IMPRESSION: No active disease.

## 2022-02-24 IMAGING — DX DG ABDOMEN 1V
1 series · 2 of 2 positions shown · non-contrast
Comparison: 02/03/2021

CLINICAL DATA: Possible constipation

EXAM:
ABDOMEN - 1 VIEW

[Series 1: abdomen · 0.14mm/px · 2 of 2 slices shown]
[im 1/2]
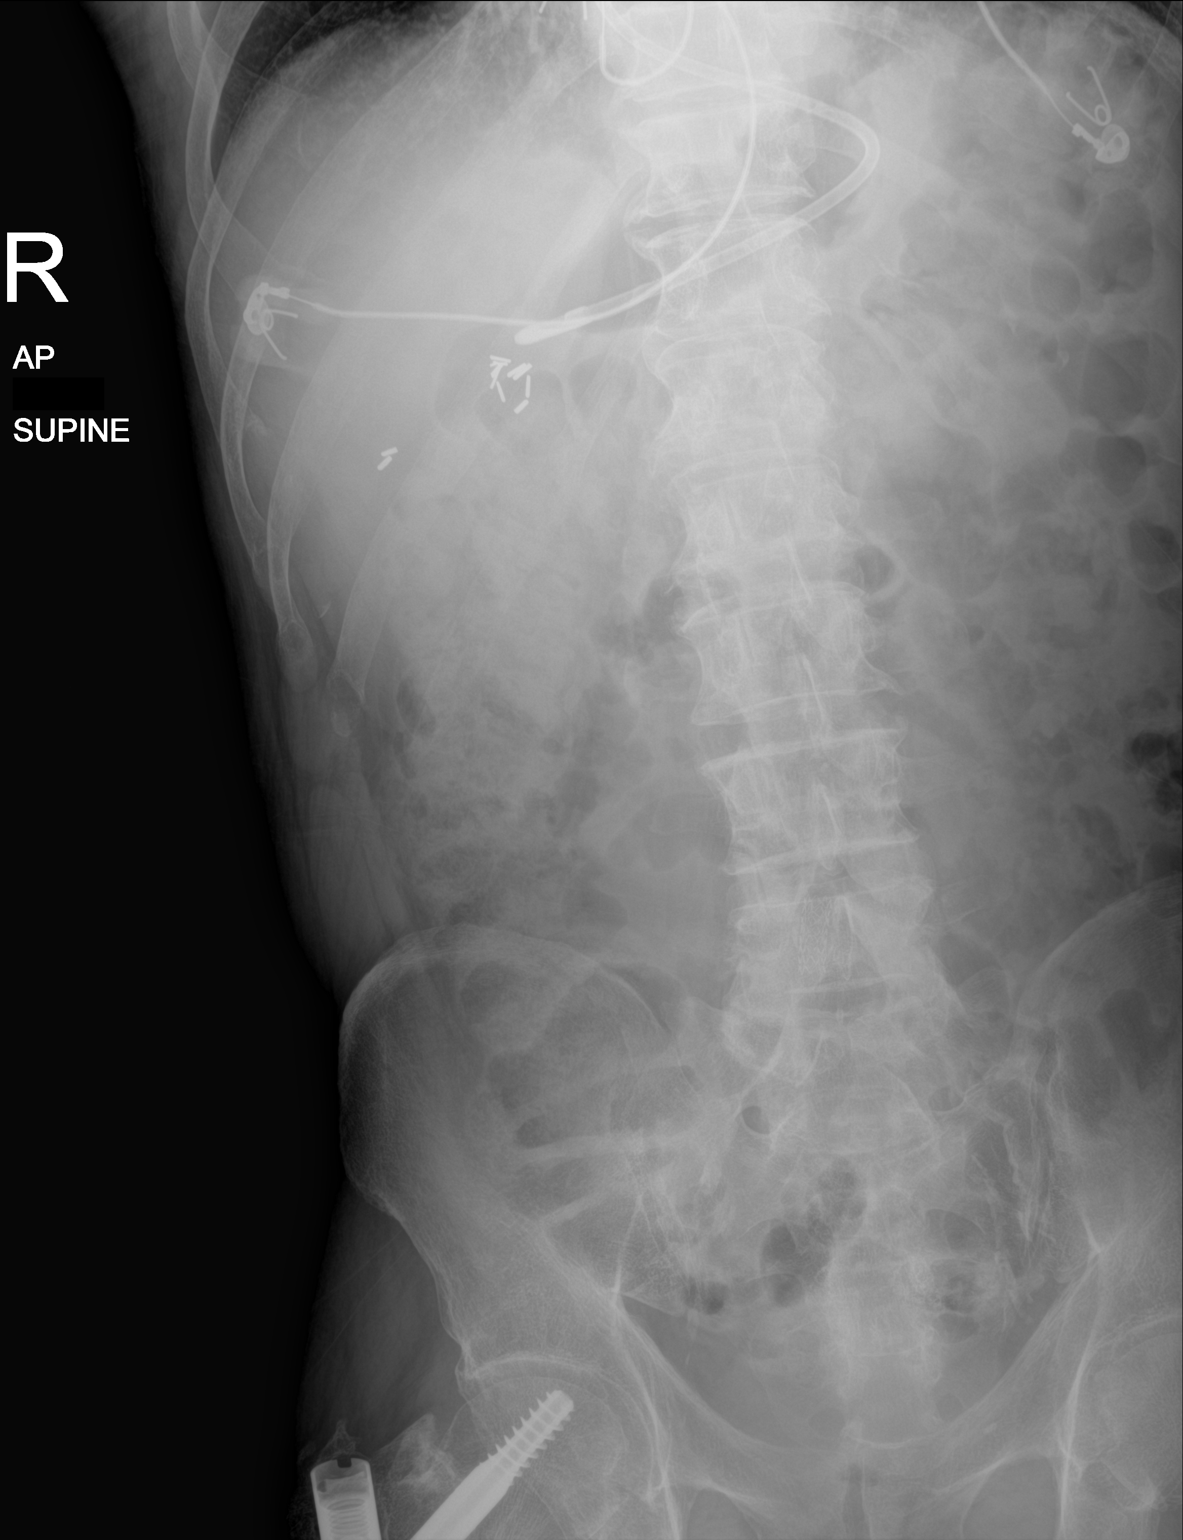
[im 2/2]
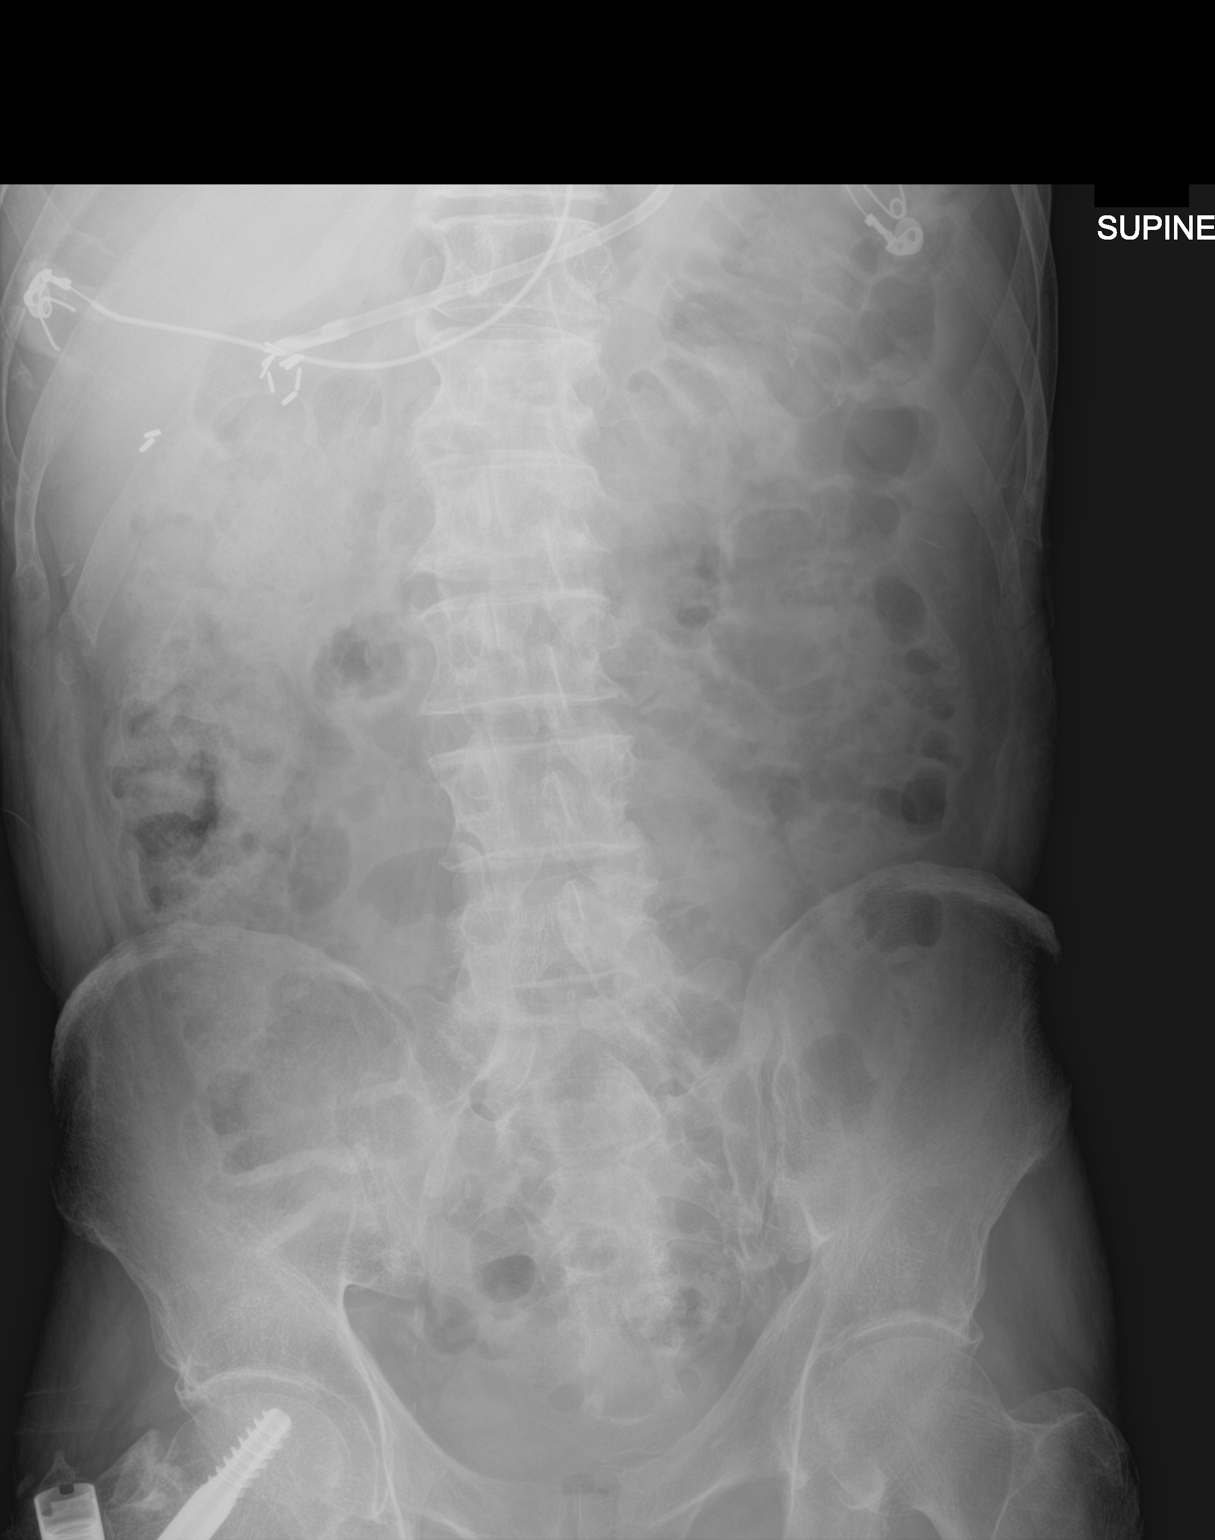

[2 of 2 positions shown; findings below may reference images not displayed]

FINDINGS: Feeding catheter is again noted in the distal stomach. Scattered
large and small bowel gas is noted. Mild retained fecal material is
noted in the right colon although no constipation is noted.
IMPRESSION: Feeding catheter in the distal stomach. No other focal abnormality
is noted.

## 2022-03-26 NOTE — Progress Notes (Signed)
Office Note     CC:  follow up Requesting Provider:  No ref. provider found  HPI: Adam Mcclain is a 71 y.o. (Dec 01, 1950) male who presents for follow up of carotid artery stenosis and PAD. He is s/p right CEA in 2017 by Dr. Imogene Burn for asymptomatic high grade stenosis. At time of his last visit  with Dr. Randie Heinz in January of this year he had reported recent stroke in 2022 and was found to have right common carotid artery stenosis. On follow up CTA imaging with Dr. Randie Heinz, he felt that the stroke was likely multifactorial and more likely secondary to atrial fibrillation or possibly clamp injury causing some thrombus from previous carotid endarterectomy on the right. He was initiated on Eliquis and at the time of follow up had no new TIA or stroke like symptoms. He was reporting some RLE pain. However it was in area of prior fracture. He does have PAD and history of right CIA stenting by Dr. Imogene Burn in 2017. No intervention was recommended at the time as he was not having claudication, rest pain or tissue loss.  Today he presents with non invasive studies. There were some concerns about his known left ICA stenosis. He denies any visual changes, slurred speech, facial drooping, headaches, confusion, unilateral upper or lower extremity weakness or numbness.he has no left upper extremity pain, weakness or numbness. He says both his hands always stay cold. He reports that he is trying to walk more with rolling walker. Says his legs are feeling better especially the right. He denies any pain on ambulation or rest, no tissue loss. He continues to stay at [facility].   The pt is on a statin for cholesterol management.  The pt is not on a daily aspirin. Other AC:  Eliquis The pt is on CCB for hypertension.   The pt is not diabetic.  Tobacco hx:  current, daily  Past Medical History:  Diagnosis Date   A-fib (HCC)    Acute on chronic respiratory failure with hypoxia (HCC)    Acute stroke due to ischemia [facility])     Anxiety    Arthritis    Atrial fibrillation (HCC)    Chronic back pain    COPD (chronic obstructive pulmonary disease) (HCC)    Dysrhythmia    A-Fib   Gastrostomy status (HCC)    GERD (gastroesophageal reflux disease)    History of kidney stones    HTN (hypertension)    Hypertension    Nephrolithiasis    PAD (peripheral artery disease) (HCC)    Paroxysmal atrial fibrillation (HCC)    PVD (peripheral vascular disease) (HCC)    Seizure disorder (HCC)    Stroke (HCC)    Tracheostomy status (HCC)     Past Surgical History:  Procedure Laterality Date   ABDOMINAL AORTAGRAM  03/25/2013   with Stent   ABDOMINAL AORTAGRAM N/A 03/25/2013   Procedure: ABDOMINAL Ronny Flurry;  Surgeon: Fransisco Hertz, MD;  Location: [facility] CATH LAB;  Service: Cardiovascular;  Laterality: N/A;   AORTOGRAM     CATARACT EXTRACTION Bilateral    CATARACT EXTRACTION, BILATERAL     CHOLECYSTECTOMY     ENDARTERECTOMY Right 06/26/2016   Procedure: ENDARTERECTOMY CAROTID RIGHT;  Surgeon: Fransisco Hertz, MD;  Location: [facility] OR;  Service: Vascular;  Laterality: Right;   EYE SURGERY     INTRAMEDULLARY (IM) NAIL INTERTROCHANTERIC Left 03/03/2021   Procedure: INTRAMEDULLARY (IM) NAIL INTERTROCHANTRIC;  Surgeon: Teryl Lucy, MD;  Location: [facility] OR;  Service: Orthopedics;  Laterality: Left;   IR GASTROSTOMY TUBE MOD SED  02/20/2021   IR GASTROSTOMY TUBE REMOVAL  05/11/2021   KIDNEY STONE SURGERY     LOWER EXTREMITY ANGIOGRAM N/A 03/25/2013   Procedure: LOWER EXTREMITY ANGIOGRAM;  Surgeon: Conrad [employer], MD;  Location: [facility] CATH LAB;  Service: Cardiovascular;  Laterality: N/A;   PATCH ANGIOPLASTY Right 06/26/2016   Procedure: PATCH ANGIOPLASTY RIGHT CAROTID ARTERY;  Surgeon: Conrad [place], MD;  Location: [place];  Service: Vascular;  Laterality: Right;   PERCUTANEOUS STENT INTERVENTION Right 03/25/2013   Procedure: PERCUTANEOUS STENT INTERVENTION;  Surgeon: Conrad [place], MD;  Location: [facility] CATH LAB;  Service: Cardiovascular;  Laterality:  Right;  icast stent to rt common iliac artery   PERIPHERAL VASCULAR CATHETERIZATION N/A 07/18/2016   Procedure: Abdominal Aortogram w/Lower Extremity;  Surgeon: Conrad [place], MD;  Location: [place] CV LAB;  Service: Cardiovascular;  Laterality: N/A;   PERIPHERAL VASCULAR CATHETERIZATION Right 07/18/2016   Procedure: Peripheral Vascular Balloon Angioplasty;  Surgeon: Conrad , MD;  Location: [place] CV LAB;  Service: Cardiovascular;  Laterality: Right;  Common Iliac   TRACHEOSTOMY TUBE PLACEMENT     YAG LASER APPLICATION Left [id number]   Procedure: YAG LASER APPLICATION;  Surgeon: Williams Che, MD;  Location: [facility] ORS;  Service: Ophthalmology;  Laterality: Left;    Social History   Socioeconomic History   Marital status: Widowed    Spouse name: Not on file   Number of children: Not on file   Years of education: Not on file   Highest education level: Not on file  Occupational History   Not on file  Tobacco Use   Smoking status: Former    Types: Cigars    Passive exposure: Never   Smokeless tobacco: Never   Tobacco comments:    7 cigars a day  Vaping Use   Vaping Use: Never used  Substance and Sexual Activity   Alcohol use: Not Currently   Drug use: No   Sexual activity: Not Currently  Other Topics Concern   Not on file  Social History Narrative   ** Merged History Encounter **       Social Determinants of Health   Financial Resource Strain: Not on file  Food Insecurity: Not on file  Transportation Needs: Not on file  Physical Activity: Not on file  Stress: Not on file  Social Connections: Not on file  Intimate Partner Violence: Not on file   Family History  Problem Relation Age of Onset   Hypertension Father    Diabetes Father    Cancer Father     Current Outpatient Medications  Medication Sig Dispense Refill   albuterol (PROVENTIL) (2.5 MG/3ML) 0.083% nebulizer solution Take 3 mLs by nebulization every morning.     Amino Acids (AMINO ACID PO)  30 mLs by PEG Tube route daily. Amino acids liquid     amLODipine (NORVASC) 10 MG tablet Place 1 tablet (10 mg total) into feeding tube daily. 30 tablet 1   apixaban (ELIQUIS) 5 MG TABS tablet Place 1 tablet (5 mg total) into feeding tube 2 (two) times daily. 1 tablet 0   atorvastatin (LIPITOR) 40 MG tablet Place 1 tablet (40 mg total) into feeding tube daily. 1 tablet 0   citalopram (CELEXA) 10 MG tablet Take 10 mg by mouth daily.     citalopram (CELEXA) 20 MG tablet Take 20 mg by mouth daily.     clonazePAM (KLONOPIN) 0.5 MG tablet Place 1 tablet (  0.5 mg total) into feeding tube at bedtime. 5 tablet 0   divalproex (DEPAKOTE) 125 MG DR tablet Take 125 mg by mouth 3 (three) times daily.     ferrous sulfate 300 (60 Fe) MG/5ML syrup Place 5 mLs (300 mg total) into feeding tube daily with breakfast.     folic acid (FOLVITE) 1 MG tablet Place 1 tablet (1 mg total) into feeding tube daily. 1 tablet 0   haloperidol (HALDOL) 2 MG/ML solution Take 1 mg by mouth daily. As needed for agitation     levETIRAcetam (KEPPRA) 100 MG/ML solution Place 10 mLs (1,000 mg total) into feeding tube 2 (two) times daily. 473 mL 12   mirtazapine (REMERON) 15 MG tablet Take 15 mg by mouth at bedtime.     Multiple Vitamin (MULTIVITAMIN WITH MINERALS) TABS tablet Place 1 tablet into feeding tube daily. 1 tablet 0   Nutritional Supplements (FEEDING SUPPLEMENT, OSMOLITE 1.5 CAL,) LIQD Place 1,000 mLs into feeding tube continuous. 55 ml/hr.  0   Nutritional Supplements (FEEDING SUPPLEMENT, PROSOURCE TF,) liquid Place 45 mLs into feeding tube daily.     pantoprazole (PROTONIX) 40 MG tablet Take 40 mg by mouth daily.     Psyllium (METAMUCIL) 28.3 % POWD Take by mouth.     Skin Protectants, Misc. (EUCERIN) cream Apply 1 application topically as needed for dry skin.     thiamine 100 MG tablet Place 100 mg into feeding tube daily.     vitamin B-12 (CYANOCOBALAMIN) 500 MCG tablet Place 500 mcg into feeding tube daily.     Water For  Irrigation, Sterile (FREE WATER) SOLN Place 100 mLs into feeding tube every 4 (four) hours.     No current facility-administered medications for this visit.    No Known Allergies   REVIEW OF SYSTEMS:  [X]  denotes positive finding, [ ]  denotes negative finding Cardiac  Comments:  Chest pain or chest pressure:    Shortness of breath upon exertion:    Short of breath when lying flat:    Irregular heart rhythm:        Vascular    Pain in calf, thigh, or hip brought on by ambulation:    Pain in feet at night that wakes you up from your sleep:     Blood clot in your veins:    Leg swelling:         Pulmonary    Oxygen at home:    Productive cough:     Wheezing:         Neurologic    Sudden weakness in arms or legs:     Sudden numbness in arms or legs:     Sudden onset of difficulty speaking or slurred speech:    Temporary loss of vision in one eye:     Problems with dizziness:         Gastrointestinal    Blood in stool:     Vomited blood:         Genitourinary    Burning when urinating:     Blood in urine:        Psychiatric    Major depression:         Hematologic    Bleeding problems:    Problems with blood clotting too easily:        Skin    Rashes or ulcers:        Constitutional    Fever or chills:      PHYSICAL EXAMINATION:  Vitals:  03/29/22 0928 03/29/22 0936  BP: 110/62 (!) 150/80  Pulse: (!) 52   Resp: 20   Temp: 98 F (36.7 C)   TempSrc: Temporal   SpO2: 100%   Height: 5\' 8"  (1.727 m)     General:  WDWN in NAD; vital signs documented above Gait: Not observed HENT: WNL, normocephalic Pulmonary: normal non-labored breathing Cardiac: regular HR, without  Murmurs with carotid bruit left Abdomen: soft, NT, no masses Vascular Exam/Pulses:  Right Left  Radial 1+ (weak) 1+ (weak)  Ulnar Not palpable Not palpable  Femoral 2+ (normal) 2+ (normal)  Popliteal Not palpable Not palpable  DP Not palpable 2+ (normal)  PT 1+ (weak) 1+ (weak)    Extremities: without ischemic changes, without Gangrene , without cellulitis; without open wounds;  Musculoskeletal: no muscle wasting or atrophy  Neurologic: A&O X 3;  No focal weakness or paresthesias are detected Psychiatric:  The pt has Normal affect.   Non-Invasive Vascular Imaging:   Abdominal Aorta Findings:  +-------------+-------+----------+----------+----------+--------+--------+  Location     AP (cm)Trans (cm)PSV (cm/s)Waveform  ThrombusComments  +-------------+-------+----------+----------+----------+--------+--------+  Distal                        67                                    +-------------+-------+----------+----------+----------+--------+--------+  RT CIA Distal                 172       monophasic                  +-------------+-------+----------+----------+----------+--------+--------+  RT EIA Prox                   104       monophasic                  +-------------+-------+----------+----------+----------+--------+--------+  RT EIA Mid                    136       biphasic                    +-------------+-------+----------+----------+----------+--------+--------+  RT EIA Distal                 85        monophasic                  +-------------+-------+----------+----------+----------+--------+--------+   Right Stent(s):  +---------------+---++--------++  Prox to Stent  67           +---------------+---++--------++  Proximal Stent 137biphasic  +---------------+---++--------++  Mid Stent      149biphasic  +---------------+---++--------++  Distal Stent   116biphasic  +---------------+---++--------++  Distal to Stent213biphasic  +---------------+---++--------++   Summary:  Stenosis: Patent right CIA stent with no stenosis.   +-------+-----------+-----------+------------+------------+  ABI/TBIToday's ABIToday's TBIPrevious ABIPrevious TBI   +-------+-----------+-----------+------------+------------+  Right  0.71       0.56       0.58        0.49          +-------+-----------+-----------+------------+------------+  Left   1.01       0.72       0.75        0.45          +-------+-----------+-----------+------------+------------+   VAS Duplex Carotid Bilateral: Summary:  Right  Carotid: Velocities in the right ICA are consistent with a 1-39% stenosis. The ECA appears >50% stenosed.   Left Carotid: Velocities in the left ICA are consistent with a 60-79% stenosis. The ECA appears >50% stenosed.   Vertebrals:  Right vertebral artery demonstrates antegrade flow. Left vertebral artery demonstrates bidirectional flow, consistent with subclavian disease.  Subclavians: Normal flow hemodynamics were seen in the right subclavian artery. Left subclavian artery flow is monophasic.    ASSESSMENT/PLAN:: 71 y.o. male here for follow up for carotid artery stenosis and PAD. He is without any attributable symptoms to his carotid stenosis. He has had no recurrent TIA or stroke. His duplex today shows stable 1-39% right ICA stenosis, and 60-79% left ICA stenosis. He does have high grade left subclavian stenosis but has no associated symptoms. On review of Prior CTA from January of 2023 shows occludion of proximal left subclavian artery with reconstitution of distally. CTA also showed 50% bilateral ICA stenosis. No surgical intervention is indicated at this time. He should continue adequate blood pressure control. Continue Eliquis and Statin - He can follow up earlier if any new or concerning symptoms - He will follow up in 6 months with ABI and bilateral carotid artery duplex   Graceann Congress, PA-C [facility] [phone]  Clinic MD:   Karin Lieu

## 2022-03-29 ENCOUNTER — Ambulatory Visit (HOSPITAL_COMMUNITY)
Admission: RE | Admit: 2022-03-29 | Discharge: 2022-03-29 | Disposition: A | Discharge status: Home / Self Care | Payer: Medicare Other | Source: Ambulatory Visit | Attending: Vascular Surgery | Admitting: Vascular Surgery

## 2022-03-29 ENCOUNTER — Ambulatory Visit (INDEPENDENT_AMBULATORY_CARE_PROVIDER_SITE_OTHER)
Admission: RE | Admit: 2022-03-29 | Discharge: 2022-03-29 | Disposition: A | Discharge status: Home / Self Care | Payer: Medicare Other | Source: Ambulatory Visit | Attending: Vascular Surgery | Admitting: Vascular Surgery

## 2022-03-29 ENCOUNTER — Ambulatory Visit (INDEPENDENT_AMBULATORY_CARE_PROVIDER_SITE_OTHER): Payer: Medicare Other | Admitting: Physician Assistant

## 2022-03-29 VITALS — BP 150/80 | HR 52 | Temp 98.0°F | Resp 20 | Ht 68.0 in

## 2022-03-29 DIAGNOSIS — I6529 Occlusion and stenosis of unspecified carotid artery: Secondary | ICD-10-CM | POA: Insufficient documentation

## 2022-03-29 DIAGNOSIS — I7409 Other arterial embolism and thrombosis of abdominal aorta: Secondary | ICD-10-CM

## 2022-03-29 DIAGNOSIS — I739 Peripheral vascular disease, unspecified: Secondary | ICD-10-CM

## 2022-04-05 ENCOUNTER — Other Ambulatory Visit: Payer: Self-pay

## 2022-04-05 DIAGNOSIS — I739 Peripheral vascular disease, unspecified: Secondary | ICD-10-CM

## 2022-04-05 DIAGNOSIS — I6529 Occlusion and stenosis of unspecified carotid artery: Secondary | ICD-10-CM

## 2022-07-03 NOTE — Progress Notes (Unsigned)
[facility] [address] [place]. [place] [address] [phone]       OFFICE FOLLOW UP NOTE  [name] Date of Birth:  10-08-1950 Medical Record Number:  [id number]   Primary neurologist: Dr. Leonie Man Reason for Referral: Stroke   No chief complaint on file.    HPI:   Update 07/04/2022 JM: Patient returns for 72-month stroke follow-up.  He continues to reside at [facility].  Overall stable without new stroke/TIA symptoms.  Reports residual ***.  Remains on Keppra, denies any recent seizures.  Compliant on Eliquis and atorvastatin.  Blood pressure well controlled.  Closely follows with vascular surgery with carotid ultrasound showing right ICA 1 to 39% stenosis and left ICA 60 to 79% stenosis, also noted high-grade left subclavian stenosis asymptomatic.  No surgical intervention recommended and plans on follow-up in 6 months with ABI and bilateral carotid duplex.       History provided for reference purposes only Update 01/02/2022 Dr. Leonie Man: He returns for follow-up after last visit 72 months ago.  Patient states that he is doing well.  He is now able to walk short distances using a walker but uses a wheelchair for long distances.  He continues to have right hip pain which limits his walking.  He had CT angiogram on 09/18/2021 which showed 50% or less left ICA stenosis and reconfiguration of the plaque with improved patency of the right common carotid artery with no hemodynamically significant stenosis.  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  Occlusion of proximal left subclavian artery with reconstitution distally.  He remains on Eliquis which is tolerating well without bleeding or bruising.  He is also tolerating Lipitor well without muscle aches and pains.  He remains on Keppra and has had no further seizures.  He did see vascular surgeon Dr. Donzetta Matters on 09/19/2021 who felt there was no need for any  vascular surgery intervention at the present time.  He plans to repeat follow-up carotid ultrasound in July.  He continues to live at [facility] facility.   Initial visit 06/20/2021 Dr. Leonie Man: [name] is a 71 year old Caucasian male seen today for initial office consult visit for stroke.  History is obtained from the patient.  He is accompanied today by [name], CNA from [place] home.  I personally reviewed electronic medical records as well as pertinent available imaging films in PACS.[name]  has PMHx of CVA, HTN, atrial fibrillation on aspirin and Plavix, COPD, and CKD who was brought in by EMS on 01/19/2021 from a parking lot where he was found to be shaking. The patient had been sitting in a car waiting on a family member who was in a doctors office. A bystander pulled up and noticed that the patient was looking to the left while shaking and was unresponsive. EMS was called and patient continued to be unresponsive when they arrived. The patient was administered 10 mg Versed in the field. He was being bagged on arrival to the ED and was emergently intubated. EMS stated that they were told the patient has a history of seizures, but no history of such can be found on review of outpatient clinic note in Epic.  He had several EEGs including long-term monitoring which showed only diffuse encephalopathy with no definite epileptiform activity. CT scan of the head showed no acute abnormality and old left PCA infarct was noted.  MRI scan showed tiny subcentimeter right  parietal cortical infarcts along the margin of a chronic parieto-occipital infarct.  There is old chronic large left PCA as well as multiple chronic lacunar infarcts noted.  Carotid ultrasound showed 80 to 99% right common carotid artery and 60 to 79% left internal carotid artery stenosis.  Left subclavian artery showed no resistant posttraumatic waveforms.  CT angiogram of the head and neck confirmed severe 80%  right common carotid artery stenosis but showed 50% left ICA stenosis with calcified and soft plaques.  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  50% right subclavian artery stenosis and bilateral moderate carotid siphon stenosis.  Right M1 segment had a short segment severe stenosis.  2D echo showed ejection fraction of 60 to 65%.  LDL cholesterol 43 mg percent and hemoglobin A1c was 5.3.  Patient had respiratory failure and difficult to extubate and hence underwent tracheostomy and PEG tube.  He was initially transferred for rehab to his skilled nursing facility where he is made gradual improvement.  He is eating well and PEG tube is out.  He is breathing well and tracheostomy has also been out.  He is now able to stand with assistance but feels unsteady.  He is able to walk with a walker but mostly prefers using a cane.  He also had a fall on 03/03/2021 and suffered a right hip intertrochanteric fracture for which he underwent surgery by Dr. Dion Saucier which went well.  He is able to now walk with a cane but still feels unsteady.  He was started on Eliquis which is tolerating well with minor bruising but no bleeding.  He has had no recurrent seizures.  Patient was seen by vascular surgery and the plan was for him to follow-up with Dr. Chestine Spore as an outpatient to consider right common carotid artery revascularization but this has not yet happened.     ROS:   14 system review of systems is positive for weakness, difficulty breathing, difficulty swallowing, imbalance, walking difficulty, bruising all other systems negative  PMH:  Past Medical History:  Diagnosis Date   A-fib (HCC)    Acute on chronic respiratory failure with hypoxia (HCC)    Acute stroke due to ischemia (HCC)    Anxiety    Arthritis    Atrial fibrillation (HCC)    Chronic back pain    COPD (chronic obstructive  pulmonary disease) (HCC)    Dysrhythmia    A-Fib   Gastrostomy status (HCC)    GERD (gastroesophageal reflux disease)    History of kidney stones    HTN (hypertension)    Hypertension    Nephrolithiasis    PAD (peripheral artery disease) (HCC)    Paroxysmal atrial fibrillation (HCC)    PVD (peripheral vascular disease) (HCC)    Seizure disorder (HCC)    Stroke (HCC)    Tracheostomy status (HCC)     Social History:  Social History   Socioeconomic History   Marital status: Widowed    Spouse name: Not on file   Number of children: Not on file   Years of education: Not on file   Highest education level: Not on file  Occupational History   Not on file  Tobacco Use   Smoking status: Former    Types: Cigars    Passive exposure: Never   Smokeless tobacco: Never   Tobacco comments:    7 cigars a day  Vaping Use  Vaping Use: Never used  Substance and Sexual Activity   Alcohol use: Not Currently   Drug use: No   Sexual activity: Not Currently  Other Topics Concern   Not on file  Social History Narrative   ** Merged History Encounter **       Social Determinants of Health   Financial Resource Strain: Not on file  Food Insecurity: Not on file  Transportation Needs: Not on file  Physical Activity: Not on file  Stress: Not on file  Social Connections: Not on file  Intimate Partner Violence: Not on file    Medications:   Current Outpatient Medications on File Prior to Visit  Medication Sig Dispense Refill   albuterol (PROVENTIL) (2.5 MG/3ML) 0.083% nebulizer solution Take 3 mLs by nebulization every morning.     Amino Acids (AMINO ACID PO) 30 mLs by PEG Tube route daily. Amino acids liquid     amLODipine (NORVASC) 10 MG tablet Place 1 tablet (10 mg total) into feeding tube daily. 30 tablet 1   apixaban (ELIQUIS) 5 MG TABS tablet Place 1 tablet (5 mg total) into feeding tube 2 (two) times daily. 1 tablet 0   atorvastatin (LIPITOR) 40 MG tablet Place 1 tablet (40 mg  total) into feeding tube daily. 1 tablet 0   citalopram (CELEXA) 10 MG tablet Take 10 mg by mouth daily.     citalopram (CELEXA) 20 MG tablet Take 20 mg by mouth daily.     clonazePAM (KLONOPIN) 0.5 MG tablet Place 1 tablet (0.5 mg total) into feeding tube at bedtime. 5 tablet 0   divalproex (DEPAKOTE) 125 MG DR tablet Take 125 mg by mouth 3 (three) times daily.     ferrous sulfate 300 (60 Fe) MG/5ML syrup Place 5 mLs (300 mg total) into feeding tube daily with breakfast.     folic acid (FOLVITE) 1 MG tablet Place 1 tablet (1 mg total) into feeding tube daily. 1 tablet 0   haloperidol (HALDOL) 2 MG/ML solution Take 1 mg by mouth daily. As needed for agitation     levETIRAcetam (KEPPRA) 100 MG/ML solution Place 10 mLs (1,000 mg total) into feeding tube 2 (two) times daily. 473 mL 12   mirtazapine (REMERON) 15 MG tablet Take 15 mg by mouth at bedtime.     Multiple Vitamin (MULTIVITAMIN WITH MINERALS) TABS tablet Place 1 tablet into feeding tube daily. 1 tablet 0   Nutritional Supplements (FEEDING SUPPLEMENT, OSMOLITE 1.5 CAL,) LIQD Place 1,000 mLs into feeding tube continuous. 55 ml/hr.  0   Nutritional Supplements (FEEDING SUPPLEMENT, PROSOURCE TF,) liquid Place 45 mLs into feeding tube daily.     pantoprazole (PROTONIX) 40 MG tablet Take 40 mg by mouth daily.     Psyllium (METAMUCIL) 28.3 % POWD Take by mouth.     Skin Protectants, Misc. (EUCERIN) cream Apply 1 application topically as needed for dry skin.     thiamine 100 MG tablet Place 100 mg into feeding tube daily.     vitamin B-12 (CYANOCOBALAMIN) 500 MCG tablet Place 500 mcg into feeding tube daily.     Water For Irrigation, Sterile (FREE WATER) SOLN Place 100 mLs into feeding tube every 4 (four) hours.     No current facility-administered medications on file prior to visit.    Allergies:  No Known Allergies  Physical Exam There were no vitals filed for this visit. There is no height or weight on file to calculate BMI.   General:  Unkempt malnourished looking elderly Caucasian male  seated, in no evident distress Head: head normocephalic and atraumatic.   Neck: supple with harsh right carotid bruit.  Healed tracheostomy scar and midline Cardiovascular: regular rate and rhythm, no murmurs Musculoskeletal: no deformity Skin:  no rash/petichiae Vascular:  Normal pulses all extremities  Neurologic Exam Mental Status: Awake and fully alert. Oriented to place and time. Recent and remote memory diminished attention span, concentration and fund of knowledge diminished. Mood and affect appropriate.  Mild dysarthria.  No aphasia.  Follows most commands. Cranial Nerves: Pupils equal, briskly reactive to light. Extraocular movements full without nystagmus. Visual fields full to confrontation. Hearing intact. Facial sensation intact. Face, tongue, palate moves normally and symmetrically.  Motor: Mild weakness of left grip and intrinsic hand muscles.  Orbits right over left upper upper extremity.  Mild weakness of hip flexors and ankle dorsiflexors on the left.   Sensory.: intact to touch , pinprick , position and vibratory sensation.  Coordination: Slightly impaired finger-to-nose coordination on the left compared to the right.  Knee to heel coordination slow on both sides. Gait and Station: Arises from chair with  difficulty. Stance is broad-based.  Gait is unsteady with favoring of the right hip and uses a walker. Reflexes: 1+ and symmetric. Toes downgoing.       ASSESSMENT/PLAN: 71 year old Caucasian male with right parietal MCA branch infarct from possible multiple etiologies-high-grade proximal right common carotid artery stenosis versus atrial fibrillation.  He had prolonged hospitalization requiring tracheostomy and PEG tube and had seizure upon arrival but now seems to be improving though still has  residual gait and balance difficulties.    Continue Eliquis (apixaban)  5 mg twice daily and atorvastatin 40 mg daily for  secondary stroke prevention  Continue Keppra 1000 mg twice daily for seizure prevention discussed close PCP follow-up for aggressive stroke risk factor management including BP goal<130/90, and HLD with LDL goal<70 Continue to follow with vascular surgery for carotid stenosis surveillance        I spent *** minutes of face-to-face and non-face-to-face time with patient.  This included previsit chart review, lab review, study review, order entry, electronic health record documentation, patient education  Frann Rider, [facility]  [facility] [address] [place], [place] [address]  Phone [phone] Fax [phone] Note: This document was prepared with digital dictation and possible smart phrase technology. Any transcriptional errors that result from this process are unintentional.

## 2022-07-04 ENCOUNTER — Ambulatory Visit (INDEPENDENT_AMBULATORY_CARE_PROVIDER_SITE_OTHER): Payer: Medicare Other | Admitting: Adult Health

## 2022-07-04 ENCOUNTER — Encounter: Payer: Self-pay | Admitting: Adult Health

## 2022-07-04 VITALS — BP 105/81 | HR 80

## 2022-07-04 DIAGNOSIS — I69398 Other sequelae of cerebral infarction: Secondary | ICD-10-CM | POA: Diagnosis not present

## 2022-07-04 DIAGNOSIS — I63239 Cerebral infarction due to unspecified occlusion or stenosis of unspecified carotid arteries: Secondary | ICD-10-CM

## 2022-07-04 DIAGNOSIS — R569 Unspecified convulsions: Secondary | ICD-10-CM

## 2022-07-04 DIAGNOSIS — G8114 Spastic hemiplegia affecting left nondominant side: Secondary | ICD-10-CM

## 2022-07-04 MED ORDER — GABAPENTIN 300 MG PO CAPS: 300.0000 mg | ORAL_CAPSULE | Freq: Every day | ORAL | 5 refills | Status: AC

## 2022-07-04 NOTE — Patient Instructions (Addendum)
Recommend starting gabapentin 300mg  nightly for left sided pains - plesae monitor for side effects. Can adjust dose based on response   Continue working with therapies  Continue keppra 1000mg  twice daily for seizure prevention  continue Eliquis (apixaban) daily  and atorvastatin  for secondary stroke prevention  Continue to follow up with PCP regarding cholesterol and blood pressure management  Maintain strict control of hypertension with blood pressure goal below 130/90 and cholesterol with LDL cholesterol (bad cholesterol) goal below 70 mg/dL.   Signs of a Stroke? Follow the BEFAST method:  Balance Watch for a sudden loss of balance, trouble with coordination or vertigo Eyes Is there a sudden loss of vision in one or both eyes? Or double vision?  Face: Ask the person to smile. Does one side of the face droop or is it numb?  Arms: Ask the person to raise both arms. Does one arm drift downward? Is there weakness or numbness of a leg? Speech: Ask the person to repeat a simple phrase. Does the speech sound slurred/strange? Is the person confused ? Time: If you observe any of these signs, call 911.  Patient also mentioned GI issues with excessive gas and "constantly feeling like I need to go to the bathroom". He was advised to further discuss with PCP or SNF provider      Followup in the future with me in 6 months or call earlier if needed       Thank you for coming to see [place] at [facility]. I hope we have been able to provide you high quality care today.  You may receive a patient satisfaction survey over the next few weeks. We would appreciate your feedback and comments so that we may continue to improve ourselves and the health of our patients.

## 2022-12-13 ENCOUNTER — Ambulatory Visit (HOSPITAL_BASED_OUTPATIENT_CLINIC_OR_DEPARTMENT_OTHER)
Admission: RE | Admit: 2022-12-13 | Discharge: 2022-12-13 | Disposition: A | Discharge status: Home / Self Care | Payer: Medicare Other | Source: Ambulatory Visit | Attending: Adult Health Nurse Practitioner | Admitting: Adult Health Nurse Practitioner

## 2022-12-13 ENCOUNTER — Ambulatory Visit (HOSPITAL_COMMUNITY)
Admission: RE | Admit: 2022-12-13 | Discharge: 2022-12-13 | Disposition: A | Discharge status: Home / Self Care | Payer: Medicare Other | Source: Ambulatory Visit | Attending: Adult Health Nurse Practitioner | Admitting: Adult Health Nurse Practitioner

## 2022-12-13 DIAGNOSIS — I6529 Occlusion and stenosis of unspecified carotid artery: Secondary | ICD-10-CM

## 2022-12-13 DIAGNOSIS — I739 Peripheral vascular disease, unspecified: Secondary | ICD-10-CM | POA: Diagnosis not present

## 2022-12-16 ENCOUNTER — Ambulatory Visit (INDEPENDENT_AMBULATORY_CARE_PROVIDER_SITE_OTHER): Payer: Medicare Other | Admitting: Physician Assistant

## 2022-12-16 ENCOUNTER — Encounter: Payer: Self-pay | Admitting: Physician Assistant

## 2022-12-16 VITALS — BP 85/58 | HR 66 | Temp 97.7°F

## 2022-12-16 DIAGNOSIS — I739 Peripheral vascular disease, unspecified: Secondary | ICD-10-CM

## 2022-12-16 DIAGNOSIS — I6529 Occlusion and stenosis of unspecified carotid artery: Secondary | ICD-10-CM

## 2022-12-16 NOTE — Progress Notes (Unsigned)
Office Note   History of Present Illness   Adam Mcclain is a 72 y.o. ([birth year]) male who presents for surveillance of PAD and carotid artery stenosis.  He has a history of right CEA in 2017 by Dr. Bridgett Larsson for asymptomatic high-grade stenosis.  He had a stroke in 2022 and was found to have repeat right common carotid artery stenosis.  On follow-up CTA imaging, it was felt that the stroke was likely multifactorial and more likely secondary to atrial fibrillation or possible previous clamp injury causing thrombus on the right.  He was initiated on Eliquis in 2022 and has not had any new stroke or TIA symptoms since then.  He also has a history of right CIA stenting by Dr. Bridgett Larsson in 2017.  At his last visit with our office in July 2023 he was not having any strokelike symptoms.  He denied any claudication or tissue loss.  He was ambulating some with a walker.   He returns today for follow up. He has now progressed to being nonambulatory for some time. The aide present today from Adam Mcclain says that he cannot stand on his own and has progressed into some sort of dementia. He has significant muscle atrophy. He denies any stroke like symptoms and is not convinced he previously had a stroke in 2022. He denies any pain explicitly in his feet but rather feels "achy all over" especially when moving. He has no lower extremity wounds. He still has significant left sided motor deficit from prior stroke.  Current Outpatient Medications  Medication Sig Dispense Refill   albuterol (VENTOLIN HFA) 108 (90 Base) MCG/ACT inhaler Inhale into the lungs.     Amino Acids (AMINO ACID PO) 30 mLs by PEG Tube route daily. Amino acids liquid     amLODipine (NORVASC) 10 MG tablet Place 1 tablet (10 mg total) into feeding tube daily. 30 tablet 1   apixaban (ELIQUIS) 5 MG TABS tablet Place 1 tablet (5 mg total) into feeding tube 2 (two) times daily. 1 tablet 0   atorvastatin (LIPITOR) 40 MG tablet Place 1 tablet (40 mg total)  into feeding tube daily. 1 tablet 0   citalopram (CELEXA) 10 MG tablet Take 20 mg by mouth 3 (three) times daily.     clonazePAM (KLONOPIN) 0.5 MG tablet Place 1 tablet (0.5 mg total) into feeding tube at bedtime. 5 tablet 0   clonazePAM (KLONOPIN) 1 MG tablet Take 1 mg by mouth at bedtime as needed.     divalproex (DEPAKOTE) 125 MG DR tablet Take 125 mg by mouth 3 (three) times daily.     famotidine (PEPCID) 20 MG tablet Take by mouth.     ferrous sulfate 300 (60 Fe) MG/5ML syrup Place 5 mLs (300 mg total) into feeding tube daily with breakfast.     folic acid (FOLVITE) 1 MG tablet Place 1 tablet (1 mg total) into feeding tube daily. 1 tablet 0   gabapentin (NEURONTIN) 300 MG capsule Take 1 capsule (300 mg total) by mouth at bedtime. 30 capsule 5   levETIRAcetam (KEPPRA) 100 MG/ML solution Place 10 mLs (1,000 mg total) into feeding tube 2 (two) times daily. 473 mL 12   Multiple Vitamin (MULTIVITAMIN WITH MINERALS) TABS tablet Place 1 tablet into feeding tube daily. 1 tablet 0   Nutritional Supplements (FEEDING SUPPLEMENT, OSMOLITE 1.5 CAL,) LIQD Place 1,000 mLs into feeding tube continuous. 55 ml/hr.  0   Nutritional Supplements (FEEDING SUPPLEMENT, PROSOURCE TF,) liquid Place 45 mLs into  feeding tube daily.     Skin Protectants, Misc. (EUCERIN) cream Apply 1 application  topically as needed for dry skin.     thiamine 100 MG tablet Place 100 mg into feeding tube daily.     vitamin B-12 (CYANOCOBALAMIN) 500 MCG tablet Place 500 mcg into feeding tube daily.     Water For Irrigation, Sterile (FREE WATER) SOLN Place 100 mLs into feeding tube every 4 (four) hours.     No current facility-administered medications for this visit.    REVIEW OF SYSTEMS (negative unless checked):   Cardiac:  []  Chest pain or chest pressure? []  Shortness of breath upon activity? []  Shortness of breath when lying flat? []  Irregular heart rhythm?  Vascular:  []  Pain in calf, thigh, or hip brought on by walking? []   Pain in feet at night that wakes you up from your sleep? []  Blood clot in your veins? []  Leg swelling?  Pulmonary:  []  Oxygen at home? []  Productive cough? []  Wheezing?  Neurologic:  []  Sudden weakness in arms or legs? []  Sudden numbness in arms or legs? []  Sudden onset of difficult speaking or slurred speech? []  Temporary loss of vision in one eye? []  Problems with dizziness?  Gastrointestinal:  []  Blood in stool? []  Vomited blood?  Genitourinary:  []  Burning when urinating? []  Blood in urine?  Psychiatric:  []  Major depression  Hematologic:  []  Bleeding problems? []  Problems with blood clotting?  Dermatologic:  []  Rashes or ulcers?  Constitutional:  []  Fever or chills?  Ear/Nose/Throat:  []  Change in hearing? []  Nose bleeds? []  Sore throat?  Musculoskeletal:  [x]  Back pain? [x]  Joint pain? []  Muscle pain?   Physical Examination   Vitals:   12/16/22 0943 12/16/22 0948  BP: (!) 145/79 (!) 85/58  Pulse: 66   Temp: 97.7 F (36.5 C)   TempSrc: Temporal   SpO2: 99%    There is no height or weight on file to calculate BMI.  General:  thin-appearing male in no acute distress Gait: Not observed, in wheelchair HENT: WNL, normocephalic Pulmonary: normal non-labored breathing , without Rales, rhonchi,  wheezing Cardiac: regular, with left carotid bruit Abdomen: soft, NT, no masses Skin: without rashes Vascular Exam/Pulses: bilateral monophasic DP/PT doppler signals Extremities: without ischemic changes, without Gangrene , without cellulitis; without open wounds;  Musculoskeletal: no muscle wasting or atrophy  Neurologic: alert and oriented to self. Left sided UE and LE motor deficit Psychiatric:  The pt has Normal affect.  Non-Invasive Vascular imaging   ABI (12/13/2022) +---------+------------------+-----+----------+--------+  Right   Rt Pressure (mmHg)IndexWaveform  Comment   +---------+------------------+-----+----------+--------+   Brachial 146                    triphasic           +---------+------------------+-----+----------+--------+  PTA     82                0.56 monophasic          +---------+------------------+-----+----------+--------+  DP      82                0.56 monophasic          +---------+------------------+-----+----------+--------+  Great Toe65                0.45 Abnormal            +---------+------------------+-----+----------+--------+   +---------+------------------+-----+----------+------------------------+  Left    Lt Pressure (mmHg)IndexWaveform  Comment                   +---------+------------------+-----+----------+------------------------+  Brachial 74                     monophasicknown subclavian disease  +---------+------------------+-----+----------+------------------------+  PTA     109               0.75 monophasic                          +---------+------------------+-----+----------+------------------------+  DP      106               0.73 monophasic                          +---------+------------------+-----+----------+------------------------+  Great Toe76                0.52 Abnormal                            +---------+------------------+-----+----------+------------------------+   Carotid Duplex (12/13/2022) RICA stenosis: 123456 LICA stenosis: 123456   Medical Decision Making   ONEAL COLLASO is a 72 y.o. male who presents for surveillance of PAD and carotid artery stenosis  Based on the patient's vascular studies, his right ABIs have decreased slightly from 0.71 to 0.56 with continued monophasic flow. ABIs on the left have also decreased from 1.01 to 0.75 with monophasic flow.  Carotid duplex demonstrates unchanged RICA stenosis of 123456 and unchanged LICA stenosis that is borderline 60% He has progressed to being nonambulatory and is not strong enough to stand on his own. He still has a significant left sided  motor deficit from prior strokes. He spends most of his time laying in bed or sitting in his wheelchair. He does not endorse rest pain, but he states that most of his body hurts all the time He denies any recent strokes or stroke-like symptoms. His aide that is present also denies knowing of any acute neurological changes he has had. On exam he has no lower extremity wounds. He has monophasic doppler signals in bilateral DP/PT arteries.  Since we did not visualize his R CIA stent today, we will study it next time he returns. He will be scheduled for a carotid duplex, ABIs, and right aortoiliac duplex in 6 months   Vicente Serene PA-C [facility] Office: [phone]  Call MD: Carlis Abbott

## 2022-12-17 ENCOUNTER — Other Ambulatory Visit: Payer: Self-pay

## 2022-12-17 DIAGNOSIS — I6529 Occlusion and stenosis of unspecified carotid artery: Secondary | ICD-10-CM

## 2022-12-17 DIAGNOSIS — I7409 Other arterial embolism and thrombosis of abdominal aorta: Secondary | ICD-10-CM

## 2022-12-17 DIAGNOSIS — I739 Peripheral vascular disease, unspecified: Secondary | ICD-10-CM

## 2023-01-02 ENCOUNTER — Ambulatory Visit (INDEPENDENT_AMBULATORY_CARE_PROVIDER_SITE_OTHER): Payer: Medicare Other | Admitting: Neurology

## 2023-01-02 ENCOUNTER — Encounter: Payer: Self-pay | Admitting: Neurology

## 2023-01-02 ENCOUNTER — Ambulatory Visit: Payer: Medicare Other | Admitting: Adult Health

## 2023-01-02 VITALS — BP 120/70 | HR 63 | Wt 140.2 lb

## 2023-01-02 DIAGNOSIS — I6523 Occlusion and stenosis of bilateral carotid arteries: Secondary | ICD-10-CM | POA: Diagnosis not present

## 2023-01-02 DIAGNOSIS — R569 Unspecified convulsions: Secondary | ICD-10-CM | POA: Diagnosis not present

## 2023-01-02 DIAGNOSIS — I639 Cerebral infarction, unspecified: Secondary | ICD-10-CM

## 2023-01-02 NOTE — Patient Instructions (Signed)
Please monitor vital signs, BP initially low, oxygen 88% 2 L, we increased to 3 L, came up to 97%. He has no complaints. Please monitor for illness, change from baseline. Can continue current medications. Follow up in 6 months with [name].

## 2023-01-02 NOTE — Progress Notes (Signed)
Patient: Adam Mcclain Date of Birth: 30-Jun-1951  Reason for Visit: Follow up History from: Patient, aide Primary Neurologist: Sethi/Jessica  ASSESSMENT AND PLAN 72 y.o. year old male right parietal MCA branch infarct from possible multiple etiologies-high-grade proximal right common carotid artery stenosis versus atrial fibrillation.  He had prolonged hospitalization requiring tracheostomy and PEG tube and had seizure upon arrival but now seems to be improving though still has  residual spastic left hemiparesis, gait and balance difficulties.  -No medication changes, remains on gabapentin 300 mg at bedtime for left-sided pain -On Eliquis 5 mg twice daily and atorvastatin 40 mg daily for secondary stroke prevention -No seizures, on Keppra 1000 mg twice a day -Strict management of vascular risk factors with a goal BP less than 130/90, A1c less than 7.0, LDL less than 70 for secondary stroke prevention -I did reach out to vascular, BP discrepancy from the right to the left arm, the left side is at baseline with hemiparesis, weakness, some pain; history of left high-grade subclavian stenosis; look backing at their notes BP usually in the 80's to the left arm, and was able to confirm baseline with vascular -I asked the facility to monitor his VS, his oxygen was initially low, improved to 97% with increasing oxygen from 2-3 L. I tried to call, I got no answer  -[name] has been seen every 6 months, will get set up for revisit  HISTORY OF PRESENT ILLNESS: Today 01/02/23 Here today for follow-up to see me since [name] is on maternity leave.  He has close follow-up with vascular, recent appointment 12/16/22 Carotid duplex demonstrates unchanged RICA stenosis of 1-39% and unchanged LICA stenosis that is borderline 60% .  Remains on Keppra, denies any recent seizures.  Just had recommended gabapentin 300 mg nightly for left-sided pain.  On Eliquis 5 mg twice daily, Lipitor 40 mg daily.  He is accompanied  by an aide from [facility] who is not familiar with him.  He has no complaints.  His med list indicates he is compliant with medications.  His initial oxygen was 88% on 2 L, but he was drowsy, we increased to 3 L and improved to 97%.  There is a BP discrepancy from the right to the left arm, but they feel equally warm to touch.  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 He reports he is no longer ambulatory, is in wheelchair now. Aide thinks he is in good spirits, enjoys being out, at the facility he can be agitated.   HISTORY Update 07/04/2022 JM: Patient returns for 25-month stroke follow-up.  He continues to reside at [facility] and accompanied by facility staff who is unfamiliar with patient.  Overall stable without new stroke/TIA symptoms.  Reports residual left-sided impairment and gait impairment which has been stable since prior visit.  Reports he continues to work with therapies.  Ambulates with rolling walker short distance in wheelchair long distance.  Denies any recent falls.  He does endorse some mild pain on left side.  Remains on Keppra, he does not believe he has had any recent seizures.  Per SNF MAR, Compliant on Eliquis and atorvastatin.  Blood pressure well controlled.  Closely follows with vascular surgery with carotid ultrasound showing right ICA 1 to 39% stenosis and left ICA 60 to 79% stenosis, also noted high-grade left subclavian stenosis asymptomatic.  No surgical intervention recommended and plans on follow-up in 6 months with ABI and bilateral carotid duplex.  Is complaining of GI  issues including excessive gas and constantly feeling like he needs to go to the bathroom, he was advised to further discuss this with SNF provider.  No further neurological/stroke concerns at this time.    REVIEW OF SYSTEMS: Out of a complete 14 system review of symptoms, the patient complains only of the following symptoms, and all other reviewed systems are negative.  See  HPI  ALLERGIES: No Known Allergies  HOME MEDICATIONS: Outpatient Medications Prior to Visit  Medication Sig Dispense Refill   acetaminophen (TYLENOL) 325 MG tablet Take 650 mg by mouth every 6 (six) hours as needed.     albuterol (VENTOLIN HFA) 108 (90 Base) MCG/ACT inhaler Inhale 2 puffs into the lungs every 6 (six) hours as needed for wheezing or shortness of breath.     amLODipine (NORVASC) 10 MG tablet Place 1 tablet (10 mg total) into feeding tube daily. 30 tablet 1   apixaban (ELIQUIS) 5 MG TABS tablet Place 1 tablet (5 mg total) into feeding tube 2 (two) times daily. 1 tablet 0   atorvastatin (LIPITOR) 40 MG tablet Place 1 tablet (40 mg total) into feeding tube daily. 1 tablet 0   clonazePAM (KLONOPIN) 0.5 MG tablet Place 1 tablet (0.5 mg total) into feeding tube at bedtime. 5 tablet 0   divalproex (DEPAKOTE) 125 MG DR tablet Take 125 mg by mouth 3 (three) times daily.     famotidine (PEPCID) 20 MG tablet Take by mouth.     ferrous sulfate 300 (60 Fe) MG/5ML syrup Place 5 mLs (300 mg total) into feeding tube daily with breakfast.     folic acid (FOLVITE) 1 MG tablet Place 1 tablet (1 mg total) into feeding tube daily. 1 tablet 0   gabapentin (NEURONTIN) 300 MG capsule Take 1 capsule (300 mg total) by mouth at bedtime. 30 capsule 5   levETIRAcetam (KEPPRA) 100 MG/ML solution Place 10 mLs (1,000 mg total) into feeding tube 2 (two) times daily. 473 mL 12   Menthol, Topical Analgesic, (BIOFREEZE) 4 % GEL Apply 1 Application topically every 8 (eight) hours as needed.     Multiple Vitamin (MULTIVITAMIN WITH MINERALS) TABS tablet Place 1 tablet into feeding tube daily. 1 tablet 0   Skin Protectants, Misc. (EUCERIN) cream Apply 1 application  topically as needed for dry skin.     thiamine 100 MG tablet Place 100 mg into feeding tube daily.     vitamin B-12 (CYANOCOBALAMIN) 500 MCG tablet Place 500 mcg into feeding tube daily.     Water For Irrigation, Sterile (FREE WATER) SOLN Place 100 mLs  into feeding tube every 4 (four) hours.     albuterol (VENTOLIN HFA) 108 (90 Base) MCG/ACT inhaler Inhale into the lungs.     Amino Acids (AMINO ACID PO) 30 mLs by PEG Tube route daily. Amino acids liquid     citalopram (CELEXA) 10 MG tablet Take 20 mg by mouth 3 (three) times daily.     clonazePAM (KLONOPIN) 1 MG tablet Take 1 mg by mouth at bedtime as needed.     Nutritional Supplements (FEEDING SUPPLEMENT, OSMOLITE 1.5 CAL,) LIQD Place 1,000 mLs into feeding tube continuous. 55 ml/hr.  0   Nutritional Supplements (FEEDING SUPPLEMENT, PROSOURCE TF,) liquid Place 45 mLs into feeding tube daily.     No facility-administered medications prior to visit.    PAST MEDICAL HISTORY: Past Medical History:  Diagnosis Date   A-fib    Acute on chronic respiratory failure with hypoxia    Acute stroke due  to ischemia    Anxiety    Arthritis    Atrial fibrillation    Chronic back pain    COPD (chronic obstructive pulmonary disease)    Dysrhythmia    A-Fib   Gastrostomy status    GERD (gastroesophageal reflux disease)    History of kidney stones    HTN (hypertension)    Hypertension    Nephrolithiasis    PAD (peripheral artery disease)    Paroxysmal atrial fibrillation    PVD (peripheral vascular disease)    Seizure disorder    Stroke    Tracheostomy status     PAST SURGICAL HISTORY: Past Surgical History:  Procedure Laterality Date   ABDOMINAL AORTAGRAM  03/25/2013   with Stent   ABDOMINAL AORTAGRAM N/A 03/25/2013   Procedure: ABDOMINAL Ronny Flurry;  Surgeon: Fransisco Hertz, MD;  Location[facility] CATH LAB;  Service: Cardiovascular;  Laterality: N/A;   AORTOGRAM     CATARACT EXTRACTION Bilateral    CATARACT EXTRACTION, BILATERAL     CHOLECYSTECTOMY     ENDARTERECTOMY Right 06/26/2016   Procedure: ENDARTERECTOMY CAROTID RIGHT;  Surgeon: Fransisco Hertz, MD;  [facility];  Service: Vascular;  Laterality: Right;   EYE SURGERY     INTRAMEDULLARY (IM) NAIL INTERTROCHANTERIC Left 03/03/2021    Procedure: INTRAMEDULLARY (IM) NAIL INTERTROCHANTRIC;  Surgeon: Teryl Lucy, MD;  Location: [facility] OR;  Service: Orthopedics;  Laterality: Left;   IR GASTROSTOMY TUBE MOD SED  02/20/2021   IR GASTROSTOMY TUBE REMOVAL  05/11/2021   KIDNEY STONE SURGERY     LOWER EXTREMITY ANGIOGRAM N/A 03/25/2013   Procedure: LOWER EXTREMITY ANGIOGRAM;  Surgeon: Fransisco Hertz, MD;  Location: [facility] CATH LAB;  Service: Cardiovascular;  Laterality: N/A;   PATCH ANGIOPLASTY Right 06/26/2016   Procedure: PATCH ANGIOPLASTY RIGHT CAROTID ARTERY;  Surgeon: Fransisco Hertz, MD;  Location: [facility] OR;  Service: Vascular;  Laterality: Right;   PERCUTANEOUS STENT INTERVENTION Right 03/25/2013   Procedure: PERCUTANEOUS STENT INTERVENTION;  Surgeon: Fransisco Hertz, MD;  Location: [facility] CATH LAB;  Service: Cardiovascular;  Laterality: Right;  icast stent to rt common iliac artery   PERIPHERAL VASCULAR CATHETERIZATION N/A 07/18/2016   Procedure: Abdominal Aortogram w/Lower Extremity;  Surgeon: Fransisco Hertz, MD;  Location: [facility] INVASIVE CV LAB;  Service: Cardiovascular;  Laterality: N/A;   PERIPHERAL VASCULAR CATHETERIZATION Right 07/18/2016   Procedure: Peripheral Vascular Balloon Angioplasty;  Surgeon: Fransisco Hertz, MD;  Location: [facility] INVASIVE CV LAB;  Service: Cardiovascular;  Laterality: Right;  Common Iliac   TRACHEOSTOMY TUBE PLACEMENT     YAG LASER APPLICATION Left 02/20/2015   Procedure: YAG LASER APPLICATION;  Surgeon: Susa Simmonds, MD;  Location: [facility] ORS;  Service: Ophthalmology;  Laterality: Left;    FAMILY HISTORY: Family History  Problem Relation Age of Onset   Hypertension Father    Diabetes Father    Cancer Father     SOCIAL HISTORY: Social History   Socioeconomic History   Marital status: Widowed    Spouse name: Not on file   Number of children: Not on file   Years of education: Not on file   Highest education level: Not on file  Occupational History   Not on file  Tobacco Use   Smoking status: Former    Types:  Cigars    Passive exposure: Never   Smokeless tobacco: Never   Tobacco comments:    7 cigars a day  Vaping Use   Vaping Use: Never used  Substance and Sexual Activity  Alcohol use: Not Currently   Drug use: No   Sexual activity: Not Currently  Other Topics Concern   Not on file  Social History Narrative   ** Merged History Encounter **       Social Determinants of Health   Financial Resource Strain: Not on file  Food Insecurity: Not on file  Transportation Needs: Not on file  Physical Activity: Not on file  Stress: Not on file  Social Connections: Not on file  Intimate Partner Violence: Not on file    PHYSICAL EXAM  Vitals:   01/02/23 0836 01/02/23 0838 01/02/23 0901 01/02/23 0902  BP: (!) 69/57 (!) 70/56 100/70 120/70  Pulse: 71 63    SpO2:  (!) 86% 97%   Weight:  140 lb 3.4 oz (63.6 kg)     Body mass index is 21.32 kg/m.  Generalized: Tired, frail, elderly male Neurological examination  Mentation: Alert, speech is slurred, mildly difficult to understand, he provides limited history, mouth is dry, dry skin noted to his face  Cranial nerve II-XII: Pupils were equal round reactive to light. Extraocular movements were full, visual field were full on confrontational test. Facial sensation and strength were normal.  Motor: Generally weak and deconditioned, increased tone left arm 3/5 strength, increased tone left leg 3/5 Sensory: Sensory testing is intact to soft touch on all 4 extremities. No evidence of extinction is noted.  Coordination: Difficulty performing Gait and station: Gait is deferred, nonambulatory  DIAGNOSTIC DATA (LABS, IMAGING, TESTING) - I reviewed patient records, labs, notes, testing and imaging myself where available.  Lab Results  Component Value Date   WBC 4.7 08/18/2021   HGB 12.8 (L) 08/18/2021   HCT 39.6 08/18/2021   MCV 96.6 08/18/2021   PLT 152 08/18/2021      Component Value Date/Time   NA 138 08/18/2021 1626   K 3.7 08/18/2021  1626   CL 103 08/18/2021 1626   CO2 27 08/18/2021 1626   GLUCOSE 84 08/18/2021 1626   BUN 20 08/18/2021 1626   CREATININE 1.35 (H) 08/18/2021 1626   CALCIUM 8.4 (L) 08/18/2021 1626   PROT 6.1 (L) 03/03/2021 0502   ALBUMIN 2.8 (L) 03/03/2021 0502   AST 36 03/03/2021 0502   ALT 60 (H) 03/03/2021 0502   ALKPHOS 108 03/03/2021 0502   BILITOT 0.5 03/03/2021 0502   GFRNONAA 56 (L) 08/18/2021 1626   GFRAA 60 (L) 06/27/2016 0320   Lab Results  Component Value Date   CHOL 118 01/23/2021   HDL 15 (L) 01/23/2021   LDLCALC 43 01/23/2021   TRIG 309 (H) 01/26/2021   CHOLHDL 7.9 01/23/2021   Lab Results  Component Value Date   HGBA1C 5.3 01/23/2021   Lab Results  Component Value Date   VITAMINB12 223 03/05/2021   Lab Results  Component Value Date   TSH 1.353 01/22/2021    Margie Ege, AGNP-C, DNP 01/02/2023, 9:03 AM [facility] [address] [place], [place] [address] [phone]

## 2023-06-17 ENCOUNTER — Encounter (HOSPITAL_COMMUNITY): Payer: Medicare Other

## 2023-06-17 ENCOUNTER — Ambulatory Visit: Payer: Medicare Other

## 2023-06-25 ENCOUNTER — Ambulatory Visit (INDEPENDENT_AMBULATORY_CARE_PROVIDER_SITE_OTHER): Payer: Medicare Other | Admitting: Physician Assistant

## 2023-06-25 ENCOUNTER — Encounter (HOSPITAL_COMMUNITY): Payer: Self-pay

## 2023-06-25 ENCOUNTER — Ambulatory Visit (HOSPITAL_COMMUNITY)
Admission: RE | Admit: 2023-06-25 | Discharge: 2023-06-25 | Disposition: A | Discharge status: Home / Self Care | Payer: Medicare Other | Source: Ambulatory Visit | Attending: Vascular Surgery | Admitting: Vascular Surgery

## 2023-06-25 ENCOUNTER — Ambulatory Visit (HOSPITAL_COMMUNITY)
Admission: RE | Admit: 2023-06-25 | Discharge: 2023-06-25 | Discharge status: Home / Self Care | Payer: Medicare Other | Source: Ambulatory Visit | Attending: Vascular Surgery

## 2023-06-25 ENCOUNTER — Ambulatory Visit (INDEPENDENT_AMBULATORY_CARE_PROVIDER_SITE_OTHER)
Admission: RE | Admit: 2023-06-25 | Discharge: 2023-06-25 | Disposition: A | Discharge status: Home / Self Care | Payer: Medicare Other | Source: Ambulatory Visit | Attending: Vascular Surgery | Admitting: Vascular Surgery

## 2023-06-25 VITALS — BP 78/61 | HR 98 | Temp 98.0°F | Resp 16

## 2023-06-25 DIAGNOSIS — I739 Peripheral vascular disease, unspecified: Secondary | ICD-10-CM

## 2023-06-25 DIAGNOSIS — I7409 Other arterial embolism and thrombosis of abdominal aorta: Secondary | ICD-10-CM | POA: Insufficient documentation

## 2023-06-25 DIAGNOSIS — I6529 Occlusion and stenosis of unspecified carotid artery: Secondary | ICD-10-CM

## 2023-06-25 NOTE — Progress Notes (Signed)
History of Present Illness:  Patient is a 72 y.o. year old male who presents for evaluation of carotid stenosis.   He has a history of Right CEA in 2017 for asymptomatic stenosis> 80 %.  He then had a stroke in 2022 and was found to have recurrent right ICA stenosis.  On follow-up CTA imaging, it was felt that the stroke was likely multifactorial and more likely secondary to atrial fibrillation or possible previous clamp injury causing thrombus on the right.  He was initiated on Eliquis in 2022 and has not had any new stroke or TIA symptoms since then.   He denies new stroke symptoms.  No vision loss.  He has residual symptoms from his last stroke with left sided weakness/loss of motor use.     He also has a history of right CIA stenting by Dr. Imogene Burn in 2017. He was scheduled for ABI's and aortoiliac duplex.  He is unable to ambulate and is WC bound.  We will re schedule these studies at [facility] for lift transfers.     He resides in a skilled facility and is medically managed on Eliquis for Afib and a daily Statin.     Past Medical History:  Diagnosis Date   A-fib (HCC)    Acute on chronic respiratory failure with hypoxia (HCC)    Acute stroke due to ischemia [facility])    Anxiety    Arthritis    Atrial fibrillation (HCC)    Chronic back pain    COPD (chronic obstructive pulmonary disease) (HCC)    Dysrhythmia    A-Fib   Gastrostomy status (HCC)    GERD (gastroesophageal reflux disease)    History of kidney stones    HTN (hypertension)    Hypertension    Nephrolithiasis    PAD (peripheral artery disease) (HCC)    Paroxysmal atrial fibrillation (HCC)    PVD (peripheral vascular disease) (HCC)    Seizure disorder (HCC)    Stroke (HCC)    Tracheostomy status (HCC)     Past Surgical History:  Procedure Laterality Date   ABDOMINAL AORTAGRAM  03/25/2013   with Stent   ABDOMINAL AORTAGRAM N/A 03/25/2013   Procedure: ABDOMINAL Ronny Flurry;  Surgeon: Fransisco Hertz, MD;  Location: [facility]  CATH LAB;  Service: Cardiovascular;  Laterality: N/A;   AORTOGRAM     CATARACT EXTRACTION Bilateral    CATARACT EXTRACTION, BILATERAL     CHOLECYSTECTOMY     ENDARTERECTOMY Right 06/26/2016   Procedure: ENDARTERECTOMY CAROTID RIGHT;  Surgeon: Fransisco Hertz, MD;  Location: [facility] OR;  Service: Vascular;  Laterality: Right;   EYE SURGERY     INTRAMEDULLARY (IM) NAIL INTERTROCHANTERIC Left 03/03/2021   Procedure: INTRAMEDULLARY (IM) NAIL INTERTROCHANTRIC;  Surgeon: Teryl Lucy, MD;  Location: [facility] OR;  Service: Orthopedics;  Laterality: Left;   IR GASTROSTOMY TUBE MOD SED  02/20/2021   IR GASTROSTOMY TUBE REMOVAL  05/11/2021   KIDNEY STONE SURGERY     LOWER EXTREMITY ANGIOGRAM N/A 03/25/2013   Procedure: LOWER EXTREMITY ANGIOGRAM;  Surgeon: Fransisco Hertz, MD;  Location: [facility] CATH LAB;  Service: Cardiovascular;  Laterality: N/A;   PATCH ANGIOPLASTY Right 06/26/2016   Procedure: PATCH ANGIOPLASTY RIGHT CAROTID ARTERY;  Surgeon: Fransisco Hertz, MD;  Location: [facility] OR;  Service: Vascular;  Laterality: Right;   PERCUTANEOUS STENT INTERVENTION Right 03/25/2013   Procedure: PERCUTANEOUS STENT INTERVENTION;  Surgeon: Fransisco Hertz, MD;  Location: [facility] CATH LAB;  Service: Cardiovascular;  Laterality: Right;  icast  stent to rt common iliac artery   PERIPHERAL VASCULAR CATHETERIZATION N/A 07/18/2016   Procedure: Abdominal Aortogram w/Lower Extremity;  Surgeon: Fransisco Hertz, MD;  Location: [facility] INVASIVE CV LAB;  Service: Cardiovascular;  Laterality: N/A;   PERIPHERAL VASCULAR CATHETERIZATION Right 07/18/2016   Procedure: Peripheral Vascular Balloon Angioplasty;  Surgeon: Fransisco Hertz, MD;  Location: [facility] INVASIVE CV LAB;  Service: Cardiovascular;  Laterality: Right;  Common Iliac   TRACHEOSTOMY TUBE PLACEMENT     YAG LASER APPLICATION Left 02/20/2015   Procedure: YAG LASER APPLICATION;  Surgeon: Susa Simmonds, MD;  Location: [facility] ORS;  Service: Ophthalmology;  Laterality: Left;     Social History Social History    Tobacco Use   Smoking status: Former    Types: Gaffer exposure: Never   Smokeless tobacco: Never   Tobacco comments:    7 cigars a day  Vaping Use   Vaping status: Never Used  Substance Use Topics   Alcohol use: Not Currently   Drug use: No    Family History Family History  Problem Relation Age of Onset   Hypertension Father    Diabetes Father    Cancer Father     Allergies  No Known Allergies   Current Outpatient Medications  Medication Sig Dispense Refill   acetaminophen (TYLENOL) 325 MG tablet Take 650 mg by mouth every 6 (six) hours as needed.     albuterol (VENTOLIN HFA) 108 (90 Base) MCG/ACT inhaler Inhale 2 puffs into the lungs every 6 (six) hours as needed for wheezing or shortness of breath.     amLODipine (NORVASC) 10 MG tablet Place 1 tablet (10 mg total) into feeding tube daily. 30 tablet 1   apixaban (ELIQUIS) 5 MG TABS tablet Place 1 tablet (5 mg total) into feeding tube 2 (two) times daily. 1 tablet 0   atorvastatin (LIPITOR) 40 MG tablet Place 1 tablet (40 mg total) into feeding tube daily. 1 tablet 0   clonazePAM (KLONOPIN) 0.5 MG tablet Place 1 tablet (0.5 mg total) into feeding tube at bedtime. 5 tablet 0   divalproex (DEPAKOTE) 125 MG DR tablet Take 125 mg by mouth 3 (three) times daily.     famotidine (PEPCID) 20 MG tablet Take by mouth.     ferrous sulfate 300 (60 Fe) MG/5ML syrup Place 5 mLs (300 mg total) into feeding tube daily with breakfast.     folic acid (FOLVITE) 1 MG tablet Place 1 tablet (1 mg total) into feeding tube daily. 1 tablet 0   gabapentin (NEURONTIN) 300 MG capsule Take 1 capsule (300 mg total) by mouth at bedtime. 30 capsule 5   levETIRAcetam (KEPPRA) 100 MG/ML solution Place 10 mLs (1,000 mg total) into feeding tube 2 (two) times daily. 473 mL 12   Menthol, Topical Analgesic, (BIOFREEZE) 4 % GEL Apply 1 Application topically every 8 (eight) hours as needed.     Multiple Vitamin (MULTIVITAMIN WITH MINERALS) TABS  tablet Place 1 tablet into feeding tube daily. 1 tablet 0   Skin Protectants, Misc. (EUCERIN) cream Apply 1 application  topically as needed for dry skin.     thiamine 100 MG tablet Place 100 mg into feeding tube daily.     vitamin B-12 (CYANOCOBALAMIN) 500 MCG tablet Place 500 mcg into feeding tube daily.     Water For Irrigation, Sterile (FREE WATER) SOLN Place 100 mLs into feeding tube every 4 (four) hours.     No current facility-administered medications for this visit.  ROS:   General:  No weight loss, Fever, chills  HEENT: No recent headaches, no nasal bleeding, no visual changes, no sore throat  Neurologic: No dizziness, blackouts, seizures. No recent symptoms of stroke or mini- stroke. No recent episodes of slurred speech, or temporary blindness.  Cardiac: No recent episodes of chest pain/pressure, no shortness of breath at rest.  No shortness of breath with exertion.  Denies history of atrial fibrillation or irregular heartbeat  Vascular: No history of rest pain in feet.  No history of claudication.  No history of non-healing ulcer, No history of DVT   Pulmonary: No home oxygen, no productive cough, no hemoptysis,  No asthma or wheezing  Musculoskeletal:  [ ]  Arthritis, [ ]  Low back pain,  [ ]  Joint pain  Hematologic:No history of hypercoagulable state.  No history of easy bleeding.  No history of anemia  Gastrointestinal: No hematochezia or melena,  No gastroesophageal reflux, no trouble swallowing  Urinary: [ ]  chronic Kidney disease, [ ]  on HD - [ ]  MWF or [ ]  TTHS, [ ]  Burning with urination, [ ]  Frequent urination, [ ]  Difficulty urinating;   Skin: No rashes  Psychological: No history of anxiety,  No history of depression   Physical Examination  Vitals:   06/25/23 0946  BP: (!) 78/61  Pulse: 98  Resp: 16  Temp: 98 F (36.7 C)  TempSrc: Temporal  SpO2: 91%    There is no height or weight on file to calculate BMI.  General:  Alert and oriented, no  acute distress HEENT: Normal Neck: left carotid  bruit  Pulmonary: Clear to auscultation bilaterally Cardiac: Regular Rate and Rhythm without murmur Gastrointestinal: Soft, non-tender, non-distended, no mass, no scars Skin: No rash, healed right CEA incision Extremity Pulses:   radial pulses B, B DP/PT doppler signals Musculoskeletal: No deformity or edema  Neurologic: Left sided motor loss  DATA:  Right Carotid Findings:  +----------+--------+--------+--------+--------------------------+--------+            PSV cm/sEDV cm/sStenosisPlaque Description         Comments  +----------+--------+--------+--------+--------------------------+--------+   CCA Prox  46      10                                                   +----------+--------+--------+--------+--------------------------+--------+   CCA Mid   111     20      <50%    heterogenous                         +----------+--------+--------+--------+--------------------------+--------+   CCA Distal112     18      <50%    irregular and heterogenous           +----------+--------+--------+--------+--------------------------+--------+   ICA Prox  47      11      1-39%   heterogenous                         +----------+--------+--------+--------+--------------------------+--------+   ICA Mid   67      16                                                   +----------+--------+--------+--------+--------------------------+--------+  ICA Distal74      16                                                   +----------+--------+--------+--------+--------------------------+--------+   ECA      143                                                          +----------+--------+--------+--------+--------------------------+--------+    +----------+--------+-------+----------------+-------------------+           PSV cm/sEDV cmsDescribe        Arm Pressure (mmHG)   +----------+--------+-------+----------------+-------------------+  [id number]           Multiphasic, [id number]                  +----------+--------+-------+----------------+-------------------+   +---------+--------+-+--------+--------+  VertebralPSV cm/s0EDV cm/soccluded  +---------+--------+-+--------+--------+      Left Carotid Findings:  +---------+--------+-------+--------+---------------------------------+----  ----+          PSV cm/sEDV    StenosisPlaque Description                Comments                  cm/s                                                       +---------+--------+-------+--------+---------------------------------+----  ----+  CCA Prox 77      17     <50%    heterogenous                                +---------+--------+-------+--------+---------------------------------+----  ----+  CCA Mid  114     21     <50%    heterogenous                                +---------+--------+-------+--------+---------------------------------+----  ----+  CCA     75      14     <50%    irregular, heterogenous and                 Distal                          calcific                                    +---------+--------+-------+--------+---------------------------------+----  ----+  ICA Prox 140     35             irregular, heterogenous and       tortuous                                 calcific                                    +---------+--------+-------+--------+---------------------------------+----  ----+  ICA Mid  191     45     40-59%  irregular, heterogenous and       tortuous                                 calcific                                    +---------+--------+-------+--------+---------------------------------+----  ----+  ICA     142     29             heterogenous                      tortuous  Distal                                                                      +---------+--------+-------+--------+---------------------------------+----  ----+  ECA     116     9                                                          +---------+--------+-------+--------+---------------------------------+----  ----+   +----------+--------+--------+------------------+-------------------+           PSV cm/sEDV cm/sDescribe          Arm Pressure (mmHG)  +----------+--------+--------+------------------+-------------------+  [id number]     12      Blunted monophasic85                   +----------+--------+--------+------------------+-------------------+   +---------+--------+---+--------+--+---------------------+  VertebralPSV cm/s140EDV cm/s21Antegrade and Blunted  +---------+--------+---+--------+--+---------------------+      Summary:  Right Carotid: Velocities in the right ICA are consistent with a 1-39%  stenosis.                Non-hemodynamically significant plaque <50% noted in the  CCA.   Left Carotid: Velocities in the left ICA are consistent with a 40-59%  stenosis.               Non-hemodynamically significant plaque <50% noted in the  CCA.   Vertebrals: Right vertebral artery demonstrates an occlusion. Left  vertebral              artery is antegrade. Left vertebral artery waveform is  blunted              suggestive of a proximal stenosis.  Subclavians: Normal flow hemodynamics were seen in the right subclavian  artery.              Left subclavian artery waveform is abnormally blunted and  monphasic              suggestive of more proxinal stenosis.    ASSESSMENT/PLAN: Carotid stenosis with history of right CEA and right  CVA likely due to Afib. The duplex shows a patent right ICA without recurrent stenosis.  On follow-up CTA imaging, it was felt that the stroke was likely multifactorial and more likely  secondary to atrial fibrillation or possible previous clamp injury  causing thrombus on the right.  He was initiated on Eliquis in 2022 and has not had any new stroke or TIA symptoms since then.  The left carotid has 40-59% stenosis and is stable.  If he has stroke symptoms to indicate left ICA or the left ICA stenosis is > 80 % he will need to have left carotid surgery.  F/U carotid studies in 1 year.    Mobility issues have caused [place] to reschedule the iliac/ABI studies at [facility] hospital in the near future and he will f/u.        Mosetta Pigeon PA-C [facility] Office: [phone]  MD in clinic [place]

## 2023-07-09 ENCOUNTER — Ambulatory Visit (INDEPENDENT_AMBULATORY_CARE_PROVIDER_SITE_OTHER): Payer: Medicare Other | Admitting: Adult Health

## 2023-07-09 ENCOUNTER — Encounter: Payer: Self-pay | Admitting: Adult Health

## 2023-07-09 VITALS — BP 128/84 | HR 80 | Ht 63.0 in

## 2023-07-09 DIAGNOSIS — I69398 Other sequelae of cerebral infarction: Secondary | ICD-10-CM | POA: Diagnosis not present

## 2023-07-09 DIAGNOSIS — I63511 Cerebral infarction due to unspecified occlusion or stenosis of right middle cerebral artery: Secondary | ICD-10-CM

## 2023-07-09 DIAGNOSIS — R569 Unspecified convulsions: Secondary | ICD-10-CM | POA: Diagnosis not present

## 2023-07-09 NOTE — Progress Notes (Signed)
[facility] [address] [place] [address] [phone]       OFFICE FOLLOW UP NOTE  [name] Date of Birth:  [birth year] Medical Record Number:  [id number]   Primary neurologist: Dr. Pearlean Brownie Reason for visit: Stroke   Chief Complaint  Patient presents with   Follow-up    Rm 3, here with Adam Mcclain / from [facility] is following up on stroke.      HPI:    Update 07/09/2023 JM: Returns for follow-up visit accompanied by [facility] SNF aide. Reports overall stable since prior visit.  No new stroke/TIA symptoms.  Continued left-sided weakness and gait impairment, unchanged. He also notes pain and weakness on right side which is not new. Patient reports he ambulates with a walker but per aide, he is not ambulatory and transfers via w/c. Patient does complain of right shoulder, hip and back pain (unclear chronicity). Does not appear he is currently working with therapies. Denies any known/witnessed seizure activity, remains on Keppra per SNF MAR as well as Eliquis and atorvastatin.  Recent follow-up with vascular surgery, left carotid stenosis stable at 40 to 59%, plans on 1 year follow-up. Per review of current orders from SNF, he is "DNR comfort care, no hospitalizations" since 04/2023.  No further questions or concerns today.     History provided for reference purposes only Update 01/02/2023 SS: here today for follow-up to see me since Adam Mcclain is on maternity leave.  He has close follow-up with vascular, recent appointment 12/16/22 Carotid duplex demonstrates unchanged RICA stenosis of 1-39% and unchanged LICA stenosis that is borderline 60% .  Remains on Keppra, denies any recent seizures.  Just had recommended gabapentin 300 mg nightly for left-sided pain.  On Eliquis 5 mg twice daily, Lipitor 40 mg daily.  He is accompanied by an aide from [facility] who is not familiar with him.  He has no complaints.  His med list indicates he is compliant with  medications.  His initial oxygen was 88% on 2 L, but he was drowsy, we increased to 3 L and improved to 97%.  There is a BP discrepancy from the right to the left arm, but they feel equally warm to touch.  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 He reports he is no longer ambulatory, is in wheelchair now. Aide thinks he is in good spirits, enjoys being out, at the facility he can be agitated.    Update 07/04/2022 JM: Patient returns for 673-month stroke follow-up.  He continues to reside at [facility] and accompanied by facility staff who is unfamiliar with patient.  Overall stable without new stroke/TIA symptoms.  Reports residual left-sided impairment and gait impairment which has been stable since prior visit.  Reports he continues to work with therapies.  Ambulates with rolling walker short distance in wheelchair long distance.  Denies any recent falls.  He does endorse some mild pain on left side.  Remains on Keppra, he does not believe he has had any recent seizures.  Per SNF MAR, Compliant on Eliquis and atorvastatin.  Blood pressure well controlled.  Closely follows with vascular surgery with carotid ultrasound showing right ICA 1 to 39% stenosis and left ICA 60 to 79% stenosis, also noted high-grade left subclavian stenosis asymptomatic.  No surgical intervention recommended and plans on follow-up in 6 months with ABI and bilateral carotid duplex.  Is complaining of GI issues including excessive gas and constantly feeling like he needs  to go to the bathroom, he was advised to further discuss this with SNF provider.  No further neurological/stroke concerns at this time.  Update 01/02/2022 Dr. Pearlean Brownie: He returns for follow-up after last visit 5 months ago.  Patient states that he is doing well.  He is now able to walk short distances using a walker but uses a wheelchair for long distances.  He continues to have right hip pain which limits his walking.  He had CT angiogram on 09/18/2021 which  showed 50% or less left ICA stenosis and reconfiguration of the plaque with improved patency of the right common carotid artery with no hemodynamically significant stenosis.  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  Occlusion of proximal left subclavian artery with reconstitution distally.  He remains on Eliquis which is tolerating well without bleeding or bruising.  He is also tolerating Lipitor well without muscle aches and pains.  He remains on Keppra and has had no further seizures.  He did see vascular surgeon Dr. Randie Heinz on 09/19/2021 who felt there was no need for any vascular surgery intervention at the present time.  He plans to repeat follow-up carotid ultrasound in July.  He continues to live at [facility] nursing facility.  Initial visit 06/20/2021 Dr. Pearlean Brownie: Adam Mcclain is a 72 year old Caucasian male seen today for initial office consult visit for stroke.  History is obtained from the patient.  He is accompanied today by Mirna Mires, CNA from [facility] nursing home.  I personally reviewed electronic medical records as well as pertinent available imaging films in PACS.Adam Mcclain  has PMHx of CVA, HTN, atrial fibrillation on aspirin and Plavix, COPD, and CKD who was brought in by EMS on 01/19/2021 from a parking lot where he was found to be shaking. The patient had been sitting in a car waiting on a family member who was in a doctors office. A bystander pulled up and noticed that the patient was looking to the left while shaking and was unresponsive. EMS was called and patient continued to be unresponsive when they arrived. The patient was administered 10 mg Versed in the field. He was being bagged on arrival to the ED and was emergently intubated. EMS stated that they were told the patient has a history of seizures, but no history of such can be found on review of outpatient clinic note in Epic.  He had several EEGs including long-term  monitoring which showed only diffuse encephalopathy with no definite epileptiform activity. CT scan of the head showed no acute abnormality and old left PCA infarct was noted.  MRI scan showed tiny subcentimeter right parietal cortical infarcts along the margin of a chronic parieto-occipital infarct.  There is old chronic large left PCA as well as multiple chronic lacunar infarcts noted.  Carotid ultrasound showed 80 to 99% right common carotid artery and 60 to 79% left internal carotid artery stenosis.  Left subclavian artery showed no resistant posttraumatic waveforms.  CT angiogram of the head and neck confirmed severe 80% right common carotid artery stenosis but showed 50% left ICA stenosis with calcified and soft plaques.  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  50% right subclavian artery stenosis and bilateral moderate carotid siphon stenosis.  Right M1 segment had a short segment severe stenosis.  2D echo showed ejection fraction of 60 to 65%.  LDL cholesterol  43 mg percent and hemoglobin A1c was 5.3.  Patient had respiratory failure and difficult to extubate and hence underwent tracheostomy and PEG tube.  He was initially transferred for rehab to his skilled nursing facility where he is made gradual improvement.  He is eating well and PEG tube is out.  He is breathing well and tracheostomy has also been out.  He is now able to stand with assistance but feels unsteady.  He is able to walk with a walker but mostly prefers using a cane.  He also had a fall on 03/03/2021 and suffered a right hip intertrochanteric fracture for which he underwent surgery by Dr. Dion Saucier which went well.  He is able to now walk with a cane but still feels unsteady.  He was started on Eliquis which is tolerating well with minor bruising but no bleeding.  He has had no recurrent seizures.  Patient was seen  by vascular surgery and the plan was for him to follow-up with Dr. Chestine Spore as an outpatient to consider right common carotid artery revascularization but this has not yet happened.     ROS:   14 system review of systems is positive for those listed in HPI and all other systems negative  PMH:  Past Medical History:  Diagnosis Date   A-fib (HCC)    Acute on chronic respiratory failure with hypoxia (HCC)    Acute stroke due to ischemia (HCC)    Anxiety    Arthritis    Atrial fibrillation (HCC)    Chronic back pain    COPD (chronic obstructive pulmonary disease) (HCC)    Dysrhythmia    A-Fib   Gastrostomy status (HCC)    GERD (gastroesophageal reflux disease)    History of kidney stones    HTN (hypertension)    Hypertension    Nephrolithiasis    PAD (peripheral artery disease) (HCC)    Paroxysmal atrial fibrillation (HCC)    PVD (peripheral vascular disease) (HCC)    Seizure disorder (HCC)    Stroke (HCC)    Tracheostomy status (HCC)     Social History:  Social History   Socioeconomic History   Marital status: Widowed    Spouse name: Not on file   Number of children: Not on file   Years of education: Not on file   Highest education level: Not on file  Occupational History   Not on file  Tobacco Use   Smoking status: Former    Types: Cigars    Passive exposure: Never   Smokeless tobacco: Never   Tobacco comments:    7 cigars a day  Vaping Use   Vaping status: Never Used  Substance and Sexual Activity   Alcohol use: Not Currently   Drug use: No   Sexual activity: Not Currently  Other Topics Concern   Not on file  Social History Narrative   ** Merged History Encounter **       Social Determinants of Health   Financial Resource Strain: Not on file  Food Insecurity: Not on file  Transportation Needs: Not on file  Physical Activity: Not on file  Stress: Not on file  Social Connections: Not on file  Intimate Partner Violence: Not on file    Medications:    Current Outpatient Medications on File Prior to Visit  Medication Sig Dispense Refill   acetaminophen (TYLENOL) 325 MG tablet Take 650 mg by mouth every 6 (six) hours as needed.     albuterol (VENTOLIN HFA) 108 (90 Base)  MCG/ACT inhaler Inhale 2 puffs into the lungs every 6 (six) hours as needed for wheezing or shortness of breath.     amLODipine (NORVASC) 10 MG tablet Place 1 tablet (10 mg total) into feeding tube daily. 30 tablet 1   apixaban (ELIQUIS) 5 MG TABS tablet Place 1 tablet (5 mg total) into feeding tube 2 (two) times daily. 1 tablet 0   atorvastatin (LIPITOR) 40 MG tablet Place 1 tablet (40 mg total) into feeding tube daily. 1 tablet 0   clonazePAM (KLONOPIN) 0.5 MG tablet Place 1 tablet (0.5 mg total) into feeding tube at bedtime. 5 tablet 0   divalproex (DEPAKOTE) 125 MG DR tablet Take 125 mg by mouth 3 (three) times daily.     famotidine (PEPCID) 20 MG tablet Take by mouth.     ferrous sulfate 300 (60 Fe) MG/5ML syrup Place 5 mLs (300 mg total) into feeding tube daily with breakfast.     folic acid (FOLVITE) 1 MG tablet Place 1 tablet (1 mg total) into feeding tube daily. 1 tablet 0   gabapentin (NEURONTIN) 300 MG capsule Take 1 capsule (300 mg total) by mouth at bedtime. 30 capsule 5   ketoconazole (NIZORAL) 2 % shampoo Apply 1 Application topically 2 (two) times a week.     levETIRAcetam (KEPPRA) 100 MG/ML solution Place 10 mLs (1,000 mg total) into feeding tube 2 (two) times daily. 473 mL 12   Menthol, Topical Analgesic, (BIOFREEZE) 4 % GEL Apply 1 Application topically every 8 (eight) hours as needed.     mirtazapine (REMERON SOL-TAB) 15 MG disintegrating tablet Take 15 mg by mouth at bedtime.     Morphine Sulfate, Concentrate, 5 MG/0.25ML SOLN Take by mouth.     Multiple Vitamin (MULTIVITAMIN WITH MINERALS) TABS tablet Place 1 tablet into feeding tube daily. 1 tablet 0   Multiple Vitamin (MULTIVITAMIN) capsule Take 1 capsule by mouth daily.     Skin Protectants, Misc.  (EUCERIN) cream Apply 1 application  topically as needed for dry skin.     thiamine 100 MG tablet Place 100 mg into feeding tube daily.     vitamin B-12 (CYANOCOBALAMIN) 500 MCG tablet Place 500 mcg into feeding tube daily.     Water For Irrigation, Sterile (FREE WATER) SOLN Place 100 mLs into feeding tube every 4 (four) hours.     No current facility-administered medications on file prior to visit.    Allergies:  No Known Allergies  Physical Exam Today's Vitals   07/09/23 0811  BP: 128/84  Pulse: 80  Height: 5\' 3"  (1.6 m)   Body mass index is 24.84 kg/m.  General: Unkempt malnourished looking very pleasant elderly Caucasian male seated, in no evident distress, use of 02 via [place]: head normocephalic and atraumatic.    Neurologic Exam Mental Status: Awake and fully alert.  Oriented to self, disoriented to place and time. Recent and remote memory diminished attention span, concentration and fund of knowledge diminished. Mood and affect appropriate. moderate dysarthria but also with poor denture.  No aphasia.  Follows most commands. Cranial Nerves: Pupils equal, briskly reactive to light. Extraocular movements full without nystagmus. Visual fields full to confrontation.  HOH bilaterally.  Facial sensation intact. Face, tongue, palate moves normally and symmetrically.  Motor: generalized weakness, right side guarded due to pain and limited movement of right shoulder and hip, Mcclain stroke left spastic hemiparesis, left hand contracture  Sensory.: subjectively feels decreased light touch in LUE and LLE Coordination: unable to test Gait and  Station: deferred Reflexes: 3+ throughout         ASSESSMENT/PLAN: 72 year old Caucasian male with right parietal MCA branch infarct from possible multiple etiologies-high-grade proximal right common carotid artery stenosis versus atrial fibrillation.  He had prolonged hospitalization requiring tracheostomy and PEG tube and had seizure upon  arrival.  Significant residual deficits including spastic left hemiparesis now with generalized weakness likely due to deconditioning (noted on prior visit 6 months ago as well).     Would recommend considering working with therapy if not already doing to help with increased tone Request SNF eval (if not already) right shoulder, back and hip pain Continue Eliquis (apixaban)  5 mg twice daily and atorvastatin 40 mg daily for secondary stroke prevention managed/prescribed by SNF Continue Keppra 1000 mg twice daily for seizure prevention discussed close PCP follow-up for aggressive stroke risk factor management including BP goal<130/90, and HLD with LDL goal<70 Continue to follow with vascular surgery for carotid stenosis surveillance     Stable from stroke standpoint without further recommendations. Request SNF continue to follow patient for stroke risk factor management and refill of ASM, if unable can schedule 1 year follow up otherwise can follow up as needed     I spent 30 minutes of face-to-face and non-face-to-face time with patient and SNF staff.  This included previsit chart review, lab review, study review, order entry, electronic health record documentation, patient education and discussion regarding above diagnoses and treatment plan and answered all other questions to patient and SNF staff satisfaction  Ihor Austin, [facility]   [facility] [address] [place], [place] [address]  Phone [phone] Fax [phone] Note: This document was prepared with digital dictation and possible smart phrase technology. Any transcriptional errors that result from this process are unintentional.

## 2023-07-09 NOTE — Patient Instructions (Addendum)
Continue keppra 1000mg  twice daily for seizure prevention  Patient complaining of low back, right hip and right shoulder pain (unsure if this is chronic). He does have generalized weakness although right testing is difficult due to pain and stiffness. Left side with increased tone and fixed hand extension post stroke. May benefit from additional PT/OT if able.   Continue  Eliquis 5mg  twice daily   and atorvastatin  for secondary stroke prevention  Continue to follow up with PCP regarding cholesterol and blood pressure management  Maintain strict control of hypertension with blood pressure goal below 130/90, diabetes with hemoglobin A1c goal below 7.0 % and cholesterol with LDL cholesterol (bad cholesterol) goal below 70 mg/dL.   Signs of a Stroke? Follow the BEFAST method:  Balance Watch for a sudden loss of balance, trouble with coordination or vertigo Eyes Is there a sudden loss of vision in one or both eyes? Or double vision?  Face: Ask the person to smile. Does one side of the face droop or is it numb?  Arms: Ask the person to raise both arms. Does one arm drift downward? Is there weakness or numbness of a leg? Speech: Ask the person to repeat a simple phrase. Does the speech sound slurred/strange? Is the person confused ? Time: If you observe any of these signs, call 911.     Ongoing follows up may not be necessary as long as he stays stable from a stroke standpoint and without any seizure activity. For now, will plan on follow up as needed but if you wish patient continue to follow with [place] routinely, please call to schedule a 1 year follow up visit     Thank you for coming to see [place] at [facility]. I hope we have been able to provide you high quality care today.  You may receive a patient satisfaction survey over the next few weeks. We would appreciate your feedback and comments so that we may continue to improve ourselves and the health of our patients.

## 2023-07-18 DEATH — deceased

## 2023-07-23 ENCOUNTER — Ambulatory Visit (HOSPITAL_COMMUNITY): Admission: RE | Admit: 2023-07-23 | Payer: Medicare Other | Source: Ambulatory Visit

## 2023-07-23 ENCOUNTER — Ambulatory Visit (HOSPITAL_COMMUNITY): Payer: Medicare Other

## 2023-08-13 ENCOUNTER — Ambulatory Visit: Payer: Medicare Other
